# Patient Record
Sex: Male | Born: 1960 | Race: White | Hispanic: No | Marital: Single | Smoking: Current every day smoker
Health system: Southern US, Community
[De-identification: ages and names within clinical notes are randomized; demographics above are authoritative.]

## PROBLEM LIST (undated history)

## (undated) ENCOUNTER — Emergency Department (HOSPITAL_COMMUNITY): Payer: Medicaid Other

## (undated) DIAGNOSIS — Z5900 Homelessness unspecified: Secondary | ICD-10-CM

## (undated) DIAGNOSIS — F1010 Alcohol abuse, uncomplicated: Secondary | ICD-10-CM

## (undated) DIAGNOSIS — D539 Nutritional anemia, unspecified: Secondary | ICD-10-CM

## (undated) DIAGNOSIS — R7989 Other specified abnormal findings of blood chemistry: Secondary | ICD-10-CM

## (undated) DIAGNOSIS — I428 Other cardiomyopathies: Secondary | ICD-10-CM

## (undated) DIAGNOSIS — R945 Abnormal results of liver function studies: Secondary | ICD-10-CM

## (undated) DIAGNOSIS — I2510 Atherosclerotic heart disease of native coronary artery without angina pectoris: Secondary | ICD-10-CM

## (undated) DIAGNOSIS — I071 Rheumatic tricuspid insufficiency: Secondary | ICD-10-CM

## (undated) DIAGNOSIS — I340 Nonrheumatic mitral (valve) insufficiency: Secondary | ICD-10-CM

## (undated) DIAGNOSIS — J439 Emphysema, unspecified: Secondary | ICD-10-CM

## (undated) DIAGNOSIS — J9600 Acute respiratory failure, unspecified whether with hypoxia or hypercapnia: Secondary | ICD-10-CM

## (undated) DIAGNOSIS — C801 Malignant (primary) neoplasm, unspecified: Secondary | ICD-10-CM

## (undated) DIAGNOSIS — E46 Unspecified protein-calorie malnutrition: Secondary | ICD-10-CM

## (undated) DIAGNOSIS — R410 Disorientation, unspecified: Secondary | ICD-10-CM

## (undated) DIAGNOSIS — I5082 Biventricular heart failure: Secondary | ICD-10-CM

## (undated) HISTORY — DX: Atherosclerotic heart disease of native coronary artery without angina pectoris: I25.10

## (undated) HISTORY — DX: Acute respiratory failure, unspecified whether with hypoxia or hypercapnia: J96.00

## (undated) HISTORY — DX: Other specified abnormal findings of blood chemistry: R79.89

## (undated) HISTORY — DX: Abnormal results of liver function studies: R94.5

## (undated) HISTORY — DX: Biventricular heart failure: I50.82

## (undated) HISTORY — DX: Disorientation, unspecified: R41.0

## (undated) HISTORY — DX: Other cardiomyopathies: I42.8

## (undated) HISTORY — DX: Homelessness unspecified: Z59.00

## (undated) HISTORY — DX: Alcohol abuse, uncomplicated: F10.10

## (undated) HISTORY — DX: Nonrheumatic mitral (valve) insufficiency: I34.0

## (undated) HISTORY — DX: Nutritional anemia, unspecified: D53.9

## (undated) HISTORY — DX: Rheumatic tricuspid insufficiency: I07.1

## (undated) HISTORY — DX: Unspecified protein-calorie malnutrition: E46

---
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
## 2011-09-11 ENCOUNTER — Ambulatory Visit: Payer: Self-pay

## 2014-12-25 DIAGNOSIS — M5440 Lumbago with sciatica, unspecified side: Secondary | ICD-10-CM | POA: Insufficient documentation

## 2015-01-25 DIAGNOSIS — J410 Simple chronic bronchitis: Secondary | ICD-10-CM | POA: Insufficient documentation

## 2015-01-25 DIAGNOSIS — F17200 Nicotine dependence, unspecified, uncomplicated: Secondary | ICD-10-CM | POA: Insufficient documentation

## 2016-09-04 DIAGNOSIS — R531 Weakness: Secondary | ICD-10-CM | POA: Diagnosis not present

## 2016-09-07 DIAGNOSIS — W19XXXA Unspecified fall, initial encounter: Secondary | ICD-10-CM | POA: Diagnosis not present

## 2016-09-07 DIAGNOSIS — S2232XA Fracture of one rib, left side, initial encounter for closed fracture: Secondary | ICD-10-CM | POA: Diagnosis not present

## 2016-09-07 DIAGNOSIS — Z01810 Encounter for preprocedural cardiovascular examination: Secondary | ICD-10-CM | POA: Diagnosis not present

## 2016-12-29 ENCOUNTER — Encounter: Payer: Self-pay | Admitting: Pediatric Intensive Care

## 2017-01-01 ENCOUNTER — Emergency Department (HOSPITAL_COMMUNITY): Payer: Medicaid Other

## 2017-01-01 ENCOUNTER — Encounter (HOSPITAL_COMMUNITY): Payer: Self-pay | Admitting: Emergency Medicine

## 2017-01-01 ENCOUNTER — Emergency Department (HOSPITAL_COMMUNITY)
Admission: EM | Admit: 2017-01-01 | Discharge: 2017-01-01 | Disposition: A | Discharge status: Home / Self Care | Payer: Medicaid Other | Attending: Physician Assistant | Admitting: Physician Assistant

## 2017-01-01 ENCOUNTER — Encounter: Payer: Self-pay | Admitting: Pediatric Intensive Care

## 2017-01-01 DIAGNOSIS — M25561 Pain in right knee: Secondary | ICD-10-CM | POA: Diagnosis present

## 2017-01-01 DIAGNOSIS — F17210 Nicotine dependence, cigarettes, uncomplicated: Secondary | ICD-10-CM | POA: Diagnosis not present

## 2017-01-01 DIAGNOSIS — G8929 Other chronic pain: Secondary | ICD-10-CM | POA: Diagnosis not present
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
## 2017-01-01 NOTE — Discharge Instructions (Signed)

## 2017-01-01 NOTE — ED Triage Notes (Signed)
Pt complaint of worsening right knee pain for months; denies injury.

## 2017-01-01 NOTE — ED Notes (Signed)
Bed: WTR6 Expected date:  Expected time:  Means of arrival:  Comments: 

## 2017-01-01 NOTE — ED Notes (Signed)
Patient is alert and oriented x3.  He was given DC instructions and follow up visit instructions.  Patient gave verbal understanding.  He was DC ambulatory under his own power to home.  V/S stable.  He was not showing any signs of distress on DC 

## 2017-01-02 ENCOUNTER — Encounter: Payer: Self-pay | Admitting: Pediatric Intensive Care

## 2017-01-24 NOTE — Congregational Nurse Program (Signed)
Congregational Nurse Program Note  Date of Encounter: 01/01/2017  Past Medical History: No past medical history on file.  Encounter Details: CNP Questionnaire - 01/01/17 0915      Questionnaire   Patient Status  Not Applicable    Race  White or Caucasian    Location Patient Served At  [facility]  Medicaid    Uninsured  Not Applicable    Food  No food insecurities    Housing/Utilities  No permanent housing    Transportation  Yes, need transportation assistance;Provided transportation assistance (bus pass, taxi voucher, etc.);Within past 12 months, lack of transportation negatively impacted life    Interpersonal Safety  Yes, feel physically and emotionally safe where you currently live    Medication  No medication insecurities    Medical Provider  Yes    Referrals  Emergency Department    ED Visit Averted  Not Applicable    Life-Saving Intervention Made  Not Applicable      Client did not go to ED on Friday but feels like his knee pain is increased. He states he cannot ambulate to bus stop. Taxi vouchers to [employer] given.

## 2017-01-24 NOTE — Congregational Nurse Program (Signed)
Congregational Nurse Program Note  Date of Encounter: 12/29/2016  Past Medical History: No past medical history on file.  Encounter Details: CNP Questionnaire - 12/29/16 0915      Questionnaire   Patient Status  Not Applicable    Race  White or Caucasian    Location Patient Served At  [facility]  Medicaid    Uninsured  Not Applicable    Food  No food insecurities    Housing/Utilities  No permanent housing    Transportation  Provided transportation assistance (bus pass, taxi voucher, etc.)    Interpersonal Safety  Yes, feel physically and emotionally safe where you currently live    Medication  No medication insecurities    Medical Provider  Yes    Referrals  Emergency Department    ED Visit Averted  Not Applicable    Life-Saving Intervention Made  Not Applicable      New client. States he has had chronic right knee pain with cortisone injections in past. States that his knee is very painful today. He has been seen at [facility] past [facility]. CN recommended ED visit if client felt that he had some acute issue today or that his knee was too painful to ambulate. CN will assist with ortho follow up.

## 2017-01-25 NOTE — Congregational Nurse Program (Signed)
Congregational Nurse Program Note  Date of Encounter: 01/02/2017  Past Medical History: No past medical history on file.  Encounter Details: CNP Questionnaire - 01/02/17 0830      Questionnaire   Patient Status  Not Applicable    Race  White or Caucasian    Location Patient Served At  [employer]    Uninsured  Not Applicable    Food  No food insecurities    Housing/Utilities  No permanent housing    Transportation  Yes, need transportation assistance;Provided transportation assistance (bus pass, taxi voucher, etc.)    Interpersonal Safety  Yes, feel physically and emotionally safe where you currently live    Medication  No medication insecurities    Medical Provider  Yes    Referrals  Area Agency    ED Visit Averted  Not Applicable    Life-Saving Intervention Made  Not Applicable      Was seen in ED for knee pain. Is wearing a brace now. Needs bus passes for DSS.

## 2017-02-23 ENCOUNTER — Encounter: Payer: Self-pay | Admitting: Pediatric Intensive Care

## 2017-03-02 ENCOUNTER — Encounter: Payer: Self-pay | Admitting: Pediatric Intensive Care

## 2017-03-12 ENCOUNTER — Emergency Department (HOSPITAL_COMMUNITY): Payer: Self-pay

## 2017-03-12 ENCOUNTER — Emergency Department (HOSPITAL_COMMUNITY)
Admission: EM | Admit: 2017-03-12 | Discharge: 2017-03-13 | Disposition: A | Discharge status: Home / Self Care | Payer: Self-pay | Attending: Emergency Medicine | Admitting: Emergency Medicine

## 2017-03-12 DIAGNOSIS — R51 Headache: Secondary | ICD-10-CM | POA: Insufficient documentation

## 2017-03-12 DIAGNOSIS — Y999 Unspecified external cause status: Secondary | ICD-10-CM | POA: Insufficient documentation

## 2017-03-12 DIAGNOSIS — Y9229 Other specified public building as the place of occurrence of the external cause: Secondary | ICD-10-CM | POA: Insufficient documentation

## 2017-03-12 DIAGNOSIS — F17210 Nicotine dependence, cigarettes, uncomplicated: Secondary | ICD-10-CM | POA: Insufficient documentation

## 2017-03-12 DIAGNOSIS — Y939 Activity, unspecified: Secondary | ICD-10-CM | POA: Insufficient documentation

## 2017-03-12 DIAGNOSIS — M25561 Pain in right knee: Secondary | ICD-10-CM | POA: Insufficient documentation

## 2017-03-12 DIAGNOSIS — S62337A Displaced fracture of neck of fifth metacarpal bone, left hand, initial encounter for closed fracture: Secondary | ICD-10-CM | POA: Insufficient documentation

## 2017-03-12 DIAGNOSIS — S0081XA Abrasion of other part of head, initial encounter: Secondary | ICD-10-CM | POA: Insufficient documentation

## 2017-03-12 DIAGNOSIS — Z23 Encounter for immunization: Secondary | ICD-10-CM | POA: Insufficient documentation

## 2017-03-12 MED ORDER — OXYCODONE HCL 5 MG PO TABS
5.0000 mg | ORAL_TABLET | Freq: Once | ORAL | Status: AC
  Administered 2017-03-12: 5 mg via ORAL
  Filled 2017-03-12: qty 1

## 2017-03-12 MED ORDER — BUPIVACAINE HCL (PF) 0.5 % IJ SOLN
10.0000 mL | Freq: Once | INTRAMUSCULAR | Status: AC
  Administered 2017-03-12: 10 mL
  Filled 2017-03-12: qty 30

## 2017-03-12 MED ORDER — TETANUS-DIPHTH-ACELL PERTUSSIS 5-2.5-18.5 LF-MCG/0.5 IM SUSP
0.5000 mL | Freq: Once | INTRAMUSCULAR | Status: AC
  Administered 2017-03-13: 0.5 mL via INTRAMUSCULAR
  Filled 2017-03-12: qty 0.5

## 2017-03-12 MED ORDER — LIDOCAINE HCL (PF) 2 % IJ SOLN
10.0000 mL | Freq: Once | INTRAMUSCULAR | Status: AC
  Administered 2017-03-12: 10 mL via INTRADERMAL
  Filled 2017-03-12: qty 10
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
## 2017-03-12 NOTE — Discharge Instructions (Signed)
It is very important for you to follow-up with your doctor and a hand surgeon in the next week.  I suspect that your finger will potentially need surgery.  It is angulated on x-ray and is unstable on my exam.  Failure to follow-up could result in severe limitation of your hand range of motion and severe pain and disability.  Leave your splint on at all times. Do not lift ANYTHING with the left hand.
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
## 2017-03-12 NOTE — ED Notes (Signed)
Pt stated that he hurts in multiple locations. He is "sore everywhere"

## 2017-03-12 NOTE — ED Notes (Signed)
Bed: WH67 Expected date:  Expected time:  Means of arrival:  Comments: Hallway pt

## 2017-03-27 ENCOUNTER — Encounter: Payer: Self-pay | Admitting: Pediatric Intensive Care

## 2017-03-27 NOTE — Congregational Nurse Program (Signed)
Congregational Nurse Program Note  Date of Encounter: 02/23/2017  Past Medical History: No past medical history on file.  Encounter Details:  Client states that his right knee pain has increased as well as pain from sciatica. CN will assist with ortho referral.

## 2017-03-28 NOTE — Congregational Nurse Program (Signed)
Congregational Nurse Program Note  Date of Encounter: 03/02/2017  Past Medical History: No past medical history on file.  Encounter Details: CNP Questionnaire - 03/02/17 0930      Questionnaire   Patient Status  Not Applicable    Race  White or Caucasian    Location Patient Served At  [employer]    Uninsured  Not Applicable    Food  No food insecurities    Housing/Utilities  No permanent housing    Transportation  Yes, need transportation assistance;Provided transportation assistance (bus pass, taxi voucher, etc.)    Interpersonal Safety  Yes, feel physically and emotionally safe where you currently live    Medication  No medication insecurities    Medical Provider  Yes    Referrals  Area Agency    ED Visit Averted  Not Applicable    Life-Saving Intervention Made  Not Applicable      Needs bus passes for medical appointment. [place] route information given.

## 2017-04-03 ENCOUNTER — Ambulatory Visit (INDEPENDENT_AMBULATORY_CARE_PROVIDER_SITE_OTHER): Payer: Medicaid Other | Admitting: Orthopaedic Surgery

## 2017-04-03 ENCOUNTER — Encounter (INDEPENDENT_AMBULATORY_CARE_PROVIDER_SITE_OTHER): Payer: Self-pay | Admitting: Orthopaedic Surgery

## 2017-04-03 ENCOUNTER — Ambulatory Visit (INDEPENDENT_AMBULATORY_CARE_PROVIDER_SITE_OTHER): Payer: Self-pay

## 2017-04-03 DIAGNOSIS — M1711 Unilateral primary osteoarthritis, right knee: Secondary | ICD-10-CM

## 2017-04-03 NOTE — Progress Notes (Signed)
   Office Visit Note   Patient: Trevor Williams           Date of Birth: 07-02-1960           MRN: [id number] Visit Date: 04/03/2017              Requested by: Marliss Coots, NP [place], [place] [id number] PCP: Marliss Coots, NP   Assessment & Plan: Visit Diagnoses:  1. Unilateral primary osteoarthritis, right knee     Plan: Impression is right knee pain with failure of conservative treatment.  Recommend MRI to rule out structural abnormalities.  Follow-up afterwards. Total face to face encounter time was greater than 45 minutes and over half of this time was spent in counseling and/or coordination of care.  Follow-Up Instructions: Return in about 10 days (around 04/13/2017).   Orders:  Orders Placed This Encounter  Procedures  . XR Knee Complete 4 Views Right   No orders of the defined types were placed in this encounter.     Procedures: No procedures performed   Clinical Data: No additional findings.   Subjective: Chief Complaint  Patient presents with  . Right Knee - Pain    Patient is a 57 year old gentleman comes in with right knee pain that is progressively getting worse over the last couple months.  He denies any injuries.  He endorses significant locking popping weakness and giving way.  He has had a previous cortisone injection with 2 weeks of relief.  Denies any numbness and tingling.    Review of Systems  Constitutional: Negative.   All other systems reviewed and are negative.    Objective: Vital Signs: There were no vitals taken for this visit.  Physical Exam  Constitutional: He is oriented to person, place, and time. He appears well-developed and well-nourished.  HENT:  Head: Normocephalic and atraumatic.  Eyes: Pupils are equal, round, and reactive to light.  Neck: Neck supple.  Pulmonary/Chest: Effort normal.  Abdominal: Soft.  Musculoskeletal: Normal range of motion.  Neurological: He is alert and oriented to person,  place, and time.  Skin: Skin is warm.  Psychiatric: He has a normal mood and affect. His behavior is normal. Judgment and thought content normal.  Nursing note and vitals reviewed.   Ortho Exam Right knee exam shows no joint effusion.  Collaterals and cruciates are grossly intact.  Normal range of motion. Specialty Comments:  No specialty comments available.  Imaging: Xr Knee Complete 4 Views Right  Result Date: 04/03/2017 No significant joint space narrowing.  No structural abnormalities.    PMFS History: There are no active problems to display for this patient.  History reviewed. No pertinent past medical history.  History reviewed. No pertinent family history.  History reviewed. No pertinent surgical history. Social History   Occupational History  . Not on file  Tobacco Use  . Smoking status: Current Every Day Smoker    Packs/day: 1.00    Types: Cigarettes  Substance and Sexual Activity  . Alcohol use: Yes  . Drug use: No  . Sexual activity: Not on file

## 2017-04-13 ENCOUNTER — Encounter (INDEPENDENT_AMBULATORY_CARE_PROVIDER_SITE_OTHER): Payer: Self-pay

## 2017-04-13 ENCOUNTER — Ambulatory Visit (INDEPENDENT_AMBULATORY_CARE_PROVIDER_SITE_OTHER): Payer: Medicaid Other | Admitting: Orthopaedic Surgery

## 2017-04-13 ENCOUNTER — Encounter: Payer: Self-pay | Admitting: Pediatric Intensive Care

## 2017-04-21 ENCOUNTER — Inpatient Hospital Stay: Admission: RE | Admit: 2017-04-21 | Payer: Medicaid Other | Source: Ambulatory Visit

## 2017-04-24 NOTE — Congregational Nurse Program (Signed)
Congregational Nurse Program Note  Date of Encounter: 03/27/2017  Past Medical History: No past medical history on file.  Encounter Details: CNP Questionnaire - 03/27/17 1100      Questionnaire   Patient Status  Not Applicable    Location Patient Served At  [employer]  Medicaid    Uninsured  Not Applicable    Food  No food insecurities    Housing/Utilities  No permanent housing    Transportation  Yes, need transportation assistance;Provided transportation assistance (bus pass, taxi voucher, etc.)    Interpersonal Safety  Yes, feel physically and emotionally safe where you currently live    Medication  No medication insecurities    Medical Provider  Yes    Referrals  Not Applicable    ED Visit Averted  Not Applicable    Life-Saving Intervention Made  Not Applicable      Bus passes provided for ortho follow up appointment.

## 2017-05-05 NOTE — Congregational Nurse Program (Signed)
Congregational Nurse Program Note  Date of Encounter: 04/13/2017  Past Medical History: No past medical history on file.  Encounter Details: CNP Questionnaire - 04/13/17 0900      Questionnaire   Patient Status  Not Applicable    Race  White or Caucasian    Location Patient Served At  [employer]    Uninsured  Not Applicable    Food  No food insecurities    Housing/Utilities  No permanent housing    Transportation  Yes, need transportation assistance;Provided transportation assistance (bus pass, taxi voucher, etc.)    Interpersonal Safety  Yes, feel physically and emotionally safe where you currently live    Medication  No medication insecurities    Medical Provider  Yes    Referrals  Other    ED Visit Averted  Not Applicable    Life-Saving Intervention Made  Not Applicable      Provided bus passes for orthpedics appointment.

## 2017-06-19 ENCOUNTER — Encounter: Payer: Self-pay | Admitting: Pediatric Intensive Care

## 2017-06-19 ENCOUNTER — Telehealth: Payer: Self-pay | Admitting: Pediatric Intensive Care

## 2017-06-19 NOTE — Telephone Encounter (Signed)
Left message to reschedule appointment for client with Dr Erlinda Hong.

## 2017-06-19 NOTE — Congregational Nurse Program (Signed)
Congregational Nurse Program Note  Date of Encounter: 06/19/2017  Past Medical History: No past medical history on file.  Encounter Details: CNP Questionnaire - 06/19/17 0945      Questionnaire   Patient Status  Not Applicable    Race  White or Caucasian    Location Patient Served At  [employer]    Uninsured  Not Applicable    Food  No food insecurities    Housing/Utilities  No permanent housing    Transportation  Yes, need transportation assistance;Within past 12 months, lack of transportation negatively impacted life    Interpersonal Safety  Yes, feel physically and emotionally safe where you currently live    Medication  No medication insecurities    Medical Provider  Yes    Referrals  Other    ED Visit Averted  Not Applicable    Life-Saving Intervention Made  Not Applicable     Client requests CN [facility] CN spoke with practice and the client needs to complete the MRI prior to next appointment. Cn will notify client.

## 2017-07-03 ENCOUNTER — Telehealth: Payer: Self-pay | Admitting: Pediatric Intensive Care

## 2017-07-03 NOTE — Telephone Encounter (Signed)
CN contacted [place] on behalf of client. MRI scheduled for 4/22 at 7:30.

## 2017-07-04 ENCOUNTER — Telehealth: Payer: Self-pay | Admitting: Pediatric Intensive Care

## 2017-07-04 NOTE — Telephone Encounter (Signed)
Cn spoke with client regarding appointment for MRI. He stated that his Medicaid is not current. CN explained that he needed to have MRI before he could have further evaluation at [facility]. CN advised client to tell her when his insurance status changes.

## 2017-07-09 ENCOUNTER — Other Ambulatory Visit: Payer: Medicaid Other

## 2018-03-04 ENCOUNTER — Emergency Department (HOSPITAL_COMMUNITY): Payer: Medicaid Other

## 2018-03-04 ENCOUNTER — Emergency Department (HOSPITAL_COMMUNITY)
Admission: EM | Admit: 2018-03-04 | Discharge: 2018-03-04 | Disposition: A | Discharge status: Home / Self Care | Payer: Medicaid Other | Attending: Emergency Medicine | Admitting: Emergency Medicine

## 2018-03-04 ENCOUNTER — Encounter (HOSPITAL_COMMUNITY): Payer: Self-pay

## 2018-03-04 DIAGNOSIS — J441 Chronic obstructive pulmonary disease with (acute) exacerbation: Secondary | ICD-10-CM | POA: Insufficient documentation

## 2018-03-04 DIAGNOSIS — F17210 Nicotine dependence, cigarettes, uncomplicated: Secondary | ICD-10-CM | POA: Insufficient documentation

## 2018-03-04 DIAGNOSIS — R0602 Shortness of breath: Secondary | ICD-10-CM

## 2018-03-04 DIAGNOSIS — Z7982 Long term (current) use of aspirin: Secondary | ICD-10-CM | POA: Insufficient documentation

## 2018-03-04 HISTORY — DX: Malignant (primary) neoplasm, unspecified: C80.1

## 2018-03-04 HISTORY — DX: Emphysema, unspecified: J43.9

## 2018-03-04 LAB — CBC
HEMATOCRIT: 32.9 % — AB (ref 39.0–52.0)
HEMOGLOBIN: 11.4 g/dL — AB (ref 13.0–17.0)
MCH: 35.8 pg — AB (ref 26.0–34.0)
MCHC: 34.7 g/dL (ref 30.0–36.0)
MCV: 103.5 fL — AB (ref 80.0–100.0)
Platelets: 46 10*3/uL — ABNORMAL LOW (ref 150–400)
RBC: 3.18 MIL/uL — AB (ref 4.22–5.81)
RDW: 13.1 % (ref 11.5–15.5)
WBC: 6.6 10*3/uL (ref 4.0–10.5)
nRBC: 0 % (ref 0.0–0.2)

## 2018-03-04 LAB — BASIC METABOLIC PANEL
ANION GAP: 15 (ref 5–15)
BUN: 6 mg/dL (ref 6–20)
CHLORIDE: 100 mmol/L (ref 98–111)
CO2: 23 mmol/L (ref 22–32)
Calcium: 8.4 mg/dL — ABNORMAL LOW (ref 8.9–10.3)
Creatinine, Ser: 0.76 mg/dL (ref 0.61–1.24)
GFR calc Af Amer: >60 mL/min (ref >60)
GFR calc non Af Amer: >60 mL/min (ref >60)
Glucose, Bld: 82 mg/dL (ref 70–99)
POTASSIUM: 3 mmol/L — AB (ref 3.5–5.1)
Sodium: 138 mmol/L (ref 135–145)

## 2018-03-04 LAB — I-STAT TROPONIN, ED
TROPONIN I, POC: 0 ng/mL (ref 0.00–0.08)
Troponin i, poc: 0.01 ng/mL (ref 0.00–0.08)

## 2018-03-04 MED ORDER — DOXYCYCLINE HYCLATE 100 MG PO CAPS: 100.0000 mg | ORAL_CAPSULE | Freq: Two times a day (BID) | ORAL | 0 refills | Status: AC

## 2018-03-04 MED ORDER — POTASSIUM CHLORIDE CRYS ER 20 MEQ PO TBCR
40.0000 meq | EXTENDED_RELEASE_TABLET | Freq: Once | ORAL | Status: AC
  Administered 2018-03-04: 40 meq via ORAL
  Filled 2018-03-04: qty 2

## 2018-03-04 MED ORDER — PREDNISONE 10 MG PO TABS: 40.0000 mg | ORAL_TABLET | Freq: Every day | ORAL | 0 refills | Status: AC

## 2018-03-04 MED ORDER — IPRATROPIUM-ALBUTEROL 0.5-2.5 (3) MG/3ML IN SOLN
3.0000 mL | Freq: Once | RESPIRATORY_TRACT | Status: AC
  Administered 2018-03-04: 3 mL via RESPIRATORY_TRACT
  Filled 2018-03-04: qty 3

## 2018-03-04 MED ORDER — POTASSIUM CHLORIDE ER 10 MEQ PO TBCR: 20.0000 meq | EXTENDED_RELEASE_TABLET | Freq: Every day | ORAL | 0 refills | Status: DC

## 2018-03-04 MED ORDER — PREDNISONE 20 MG PO TABS
60.0000 mg | ORAL_TABLET | Freq: Once | ORAL | Status: AC
  Administered 2018-03-04: 60 mg via ORAL
  Filled 2018-03-04: qty 3

## 2018-03-04 MED ORDER — DOXYCYCLINE HYCLATE 100 MG PO TABS
100.0000 mg | ORAL_TABLET | Freq: Once | ORAL | Status: AC
  Administered 2018-03-04: 100 mg via ORAL
  Filled 2018-03-04: qty 1

## 2018-03-04 MED ORDER — ALBUTEROL SULFATE HFA 108 (90 BASE) MCG/ACT IN AERS
2.0000 | INHALATION_SPRAY | Freq: Once | RESPIRATORY_TRACT | Status: AC
  Administered 2018-03-04: 2 via RESPIRATORY_TRACT
  Filled 2018-03-04: qty 6.7

## 2018-03-04 NOTE — ED Notes (Signed)
Patient verbalizes understanding of discharge instructions. Opportunity for questioning and answers were provided. Armband removed by staff, pt discharged from ED home.

## 2018-03-04 NOTE — ED Notes (Signed)
Pt reports a cough for a month that is productive with white phlem. Pt reports he is homeless and is not currently on any medication.

## 2018-03-04 NOTE — ED Provider Notes (Signed)
[facility] EMERGENCY DEPARTMENT Provider Note   CSN: [id number] Arrival date & time: 03/04/18  1627   History   Chief Complaint Chief Complaint  Patient presents with  . Chest Pain  . Shortness of Breath    HPI Trevor Williams is a 57 y.o. male.  HPI Patient is a 57 year old male with history of COPD who presents to the emergency department for evaluation of cough and congestion.  Patient reports that he has had productive cough with white phlegm over the past 3-4 days.  Has had chest pain associated with this that is with coughing only.  Has had no fevers.  States he does not have any home medications including home inhalers due to being homeless.  Saturations of 93% per EMS.  Received 324 mg of aspirin by EMS prior to ED arrival.  In the ED patient has no chest pain.  States symptoms been present x1 month and worse over the past 3-4 days.  No recent antibiotics.  No leg swelling.  No history of DVT or PE.  No history of CAD.  Past Medical History:  Diagnosis Date  . Cancer ([place])    skin (left hand)  . Emphysema of lung ([place])     There are no active problems to display for this patient.   No past surgical history on file.      Home Medications    Prior to Admission medications   Medication Sig Start Date End Date Taking? Authorizing Provider  Aspirin-Acetaminophen-Caffeine (GOODY HEADACHE PO) Take 2 packets by mouth daily as needed (pain/headache).   Yes [provider]  doxycycline (VIBRAMYCIN) 100 MG capsule Take 1 capsule (100 mg total) by mouth 2 (two) times daily for 5 days. 03/04/18 03/09/18  Corrie Dandy, MD  potassium chloride (K-DUR) 10 MEQ tablet Take 2 tablets (20 mEq total) by mouth daily for 3 days. 03/04/18 03/07/18  Corrie Dandy, MD  predniSONE (DELTASONE) 10 MG tablet Take 4 tablets (40 mg total) by mouth daily for 3 days. 03/04/18 03/07/18  Corrie Dandy, MD    Family History No family history on file.  Social  History Social History   Tobacco Use  . Smoking status: Current Every Day Smoker    Packs/day: 1.00    Types: Cigarettes  . Smokeless tobacco: Current User  Substance Use Topics  . Alcohol use: Yes  . Drug use: No     Allergies   Ibuprofen and Tylenol [acetaminophen]   Review of Systems Review of Systems  Constitutional: Negative for chills and fever.  HENT: Positive for congestion.   Respiratory: Positive for cough and shortness of breath.   Cardiovascular: Positive for chest pain. Negative for leg swelling.  Gastrointestinal: Negative for abdominal pain, diarrhea, nausea and vomiting.  Genitourinary: Negative for dysuria and hematuria.  Musculoskeletal: Negative for back pain and neck pain.  Skin: Negative for rash.  Neurological: Negative for weakness, numbness and headaches.  All other systems reviewed and are negative.    Physical Exam Updated Vital Signs BP 124/84   Pulse 88   Temp 98.9 F (37.2 C) (Oral)   Resp 16   Ht 5\' 6"  (1.676 m)   Wt 61.2 kg   SpO2 100%   BMI 21.79 kg/m   Physical Exam Vitals signs and nursing note reviewed.  Constitutional:      General: He is not in acute distress.    Appearance: He is not ill-appearing, toxic-appearing or diaphoretic.  HENT:     Head: Normocephalic  and atraumatic.     Nose: No congestion.  Eyes:     General:        Right eye: No discharge.        Left eye: No discharge.     Conjunctiva/sclera: Conjunctivae normal.  Neck:     Musculoskeletal: Normal range of motion.     Trachea: No tracheal deviation.  Cardiovascular:     Rate and Rhythm: Regular rhythm.     Heart sounds: No murmur.  Pulmonary:     Effort: Pulmonary effort is normal. No respiratory distress.     Breath sounds: Wheezing (scattered bilaterally) present. No rales.  Chest:     Chest wall: Tenderness (reproducible) present.  Abdominal:     General: Bowel sounds are normal. There is no distension.     Palpations: Abdomen is soft.      Tenderness: There is no abdominal tenderness.  Musculoskeletal:        General: No deformity.     Right lower leg: No edema.     Left lower leg: No edema.  Skin:    Capillary Refill: Capillary refill takes less than 2 seconds.     Findings: No rash.  Neurological:     Mental Status: He is alert.     Motor: No abnormal muscle tone.      ED Treatments / Results  Labs (all labs ordered are listed, but only abnormal results are displayed) Labs Reviewed  BASIC METABOLIC PANEL - Abnormal; Notable for the following components:      Result Value   Potassium 3.0 (*)    Calcium 8.4 (*)    All other components within normal limits  CBC - Abnormal; Notable for the following components:   RBC 3.18 (*)    Hemoglobin 11.4 (*)    HCT 32.9 (*)    MCV 103.5 (*)    MCH 35.8 (*)    Platelets 46 (*)    All other components within normal limits  I-STAT TROPONIN, ED  I-STAT TROPONIN, ED    EKG EKG Interpretation  Date/Time:  Monday March 04 2018 16:37:38 EST Ventricular Rate:  86 PR Interval:  142 QRS Duration: 72 QT Interval:  414 QTC Calculation: 495 R Axis:   73 Text Interpretation:  Normal sinus rhythm Septal infarct , age undetermined ST & T wave abnormality, consider inferolateral ischemia Abnormal ECG No old tracing to compare Confirmed by Deno Etienne [phone]) on 03/04/2018 8:47:55 PM   Radiology Dg Chest 2 View  Result Date: 03/04/2018 CLINICAL DATA:  Productive cough for 1 month EXAM: CHEST - 2 VIEW COMPARISON:  03/12/2017 FINDINGS: Cardiac shadows within normal limits. The lungs are hyperaerated bilaterally. No focal infiltrate or sizable effusion is seen. Posterior left eighth rib fracture is noted with callus formation slightly more prominent than that seen on the prior exam. No acute bony abnormality is seen. IMPRESSION: COPD without acute abnormality. Old left rib fracture with callus formation. Electronically Signed   By: Inez Catalina M.D.   On: 03/04/2018 17:54     Procedures Procedures (including critical care time)  Medications Ordered in ED Medications  ipratropium-albuterol (DUONEB) 0.5-2.5 (3) MG/3ML nebulizer solution 3 mL (3 mLs Nebulization Given 03/04/18 2237)  predniSONE (DELTASONE) tablet 60 mg (60 mg Oral Given 03/04/18 2237)  albuterol (PROVENTIL HFA;VENTOLIN HFA) 108 (90 Base) MCG/ACT inhaler 2 puff (2 puffs Inhalation Given 03/04/18 2236)  doxycycline (VIBRA-TABS) tablet 100 mg (100 mg Oral Given 03/04/18 2237)  potassium chloride SA (K-DUR,KLOR-CON) CR tablet  40 mEq (40 mEq Oral Given 03/04/18 2253)     Initial Impression / Assessment and Plan / ED Course  I have reviewed the triage vital signs and the nursing notes.  Pertinent labs & imaging results that were available during my care of the patient were reviewed by me and considered in my medical decision making (see chart for details).    Patient is a 57 year old male with history of COPD who presents to the emergency department for evaluation of cough, congestion, CP, & SOA.   Patient afebrile and hemodynamically stable on room air at time of ED presentation.  Exam as above.  Nontoxic-appearing.  Speaking full sentences.  Ambulates at hallway without difficulty.  No hypoxia.  Patient has very atypical chest pain that is reproducible on exam and scattered wheezing bilaterally.  Most concerning for COPD exacerbation or pneumonia.  Will obtain chest x-ray and give duo nebs and steroids.  ECG obtained that does show T wave inversions in the inferior and lateral leads.  No prior ECG available for comparison.  Will obtain delta troponins. HEART pathway score 3.   CXR reviewed with no acute cardiac or pulmonary abnormality.  No focal consolidation to suggest pneumonia.  No effusions.  No widening of mediastinum.  No findings to suggest aortic dissection or aneurysm at this time.  Patient has no leg swelling.  No tachycardia.  No findings to concern for PE at this time.  Metabolic  panel remarkable for hypokalemia.  Repleted in the ED.  CBC with mild anemia with hemoglobin 11.4.  No leukocytosis. Plts 46, chronic thrombocytopenia per reviewe of OSH records.   Delta troponin remains unremarkable. Very atypical chest pain. Discussed admission for further workup with patient who prefers dc at this time and will f/u with PCP for these findings. Will dc with with prednisone, albuterol inhaler, and doxycycline for COPD exacerbation. Strict return precautions given. Stable at dc.   Case and plan of care discussed with Dr. Tyrone Nine.   Final Clinical Impressions(s) / ED Diagnoses   Final diagnoses:  COPD exacerbation ([place])  Shortness of breath    ED Discharge Orders         Ordered    doxycycline (VIBRAMYCIN) 100 MG capsule  2 times daily     03/04/18 2244    predniSONE (DELTASONE) 10 MG tablet  Daily     03/04/18 2244    potassium chloride (K-DUR) 10 MEQ tablet  Daily     03/04/18 2244           Corrie Dandy, MD 03/04/18 [place], [place], DO 03/04/18 2332

## 2018-03-04 NOTE — ED Triage Notes (Signed)
Per [facility], pt endorses propductive cough x1 month, left sided CP 6/10, and SOB x4 days. Pt has hx of emphysema. Pt received 324 ASA en route. Per EMS pt was 93% on RA, but 100% on [place]. PT 99% on RA after ambulating into triage room.

## 2018-06-01 ENCOUNTER — Other Ambulatory Visit: Payer: Self-pay

## 2018-06-01 ENCOUNTER — Emergency Department (HOSPITAL_COMMUNITY)
Admission: EM | Admit: 2018-06-01 | Discharge: 2018-06-01 | Disposition: A | Discharge status: Home / Self Care | Payer: Medicaid Other | Attending: Emergency Medicine | Admitting: Emergency Medicine

## 2018-06-01 ENCOUNTER — Encounter (HOSPITAL_COMMUNITY): Payer: Self-pay | Admitting: Emergency Medicine

## 2018-06-01 ENCOUNTER — Emergency Department (HOSPITAL_COMMUNITY): Payer: Medicaid Other

## 2018-06-01 DIAGNOSIS — Z79899 Other long term (current) drug therapy: Secondary | ICD-10-CM | POA: Insufficient documentation

## 2018-06-01 DIAGNOSIS — J441 Chronic obstructive pulmonary disease with (acute) exacerbation: Secondary | ICD-10-CM | POA: Diagnosis not present

## 2018-06-01 DIAGNOSIS — R0602 Shortness of breath: Secondary | ICD-10-CM | POA: Diagnosis not present

## 2018-06-01 DIAGNOSIS — F17210 Nicotine dependence, cigarettes, uncomplicated: Secondary | ICD-10-CM | POA: Diagnosis not present

## 2018-06-01 DIAGNOSIS — R0981 Nasal congestion: Secondary | ICD-10-CM | POA: Diagnosis present

## 2018-06-01 LAB — URINALYSIS, ROUTINE W REFLEX MICROSCOPIC
BACTERIA UA: NONE SEEN
BILIRUBIN URINE: NEGATIVE
Glucose, UA: NEGATIVE mg/dL
Hgb urine dipstick: NEGATIVE
Ketones, ur: NEGATIVE mg/dL
Leukocytes,Ua: NEGATIVE
Nitrite: NEGATIVE
Protein, ur: 30 mg/dL — AB
Specific Gravity, Urine: 1.014 (ref 1.005–1.030)
pH: 7 (ref 5.0–8.0)

## 2018-06-01 LAB — CBC WITH DIFFERENTIAL/PLATELET
Abs Immature Granulocytes: 0.01 10*3/uL (ref 0.00–0.07)
BASOS PCT: 1 %
Basophils Absolute: 0 10*3/uL (ref 0.0–0.1)
Eosinophils Absolute: 0.2 10*3/uL (ref 0.0–0.5)
Eosinophils Relative: 4 %
HCT: 35.3 % — ABNORMAL LOW (ref 39.0–52.0)
Hemoglobin: 11.5 g/dL — ABNORMAL LOW (ref 13.0–17.0)
Immature Granulocytes: 0 %
Lymphocytes Relative: 40 %
Lymphs Abs: 1.4 10*3/uL (ref 0.7–4.0)
MCH: 35.4 pg — AB (ref 26.0–34.0)
MCHC: 32.6 g/dL (ref 30.0–36.0)
MCV: 108.6 fL — AB (ref 80.0–100.0)
MONO ABS: 0.3 10*3/uL (ref 0.1–1.0)
MONOS PCT: 9 %
Neutro Abs: 1.6 10*3/uL — ABNORMAL LOW (ref 1.7–7.7)
Neutrophils Relative %: 46 %
Platelets: 40 10*3/uL — ABNORMAL LOW (ref 150–400)
RBC: 3.25 MIL/uL — AB (ref 4.22–5.81)
RDW: 12.4 % (ref 11.5–15.5)
WBC: 3.5 10*3/uL — ABNORMAL LOW (ref 4.0–10.5)
nRBC: 0 % (ref 0.0–0.2)

## 2018-06-01 LAB — COMPREHENSIVE METABOLIC PANEL
ALT: 76 U/L — ABNORMAL HIGH (ref 0–44)
AST: 193 U/L — ABNORMAL HIGH (ref 15–41)
Albumin: 3.6 g/dL (ref 3.5–5.0)
Alkaline Phosphatase: 97 U/L (ref 38–126)
Anion gap: 10 (ref 5–15)
BILIRUBIN TOTAL: 0.7 mg/dL (ref 0.3–1.2)
BUN: 5 mg/dL — ABNORMAL LOW (ref 6–20)
CO2: 26 mmol/L (ref 22–32)
Calcium: 8.3 mg/dL — ABNORMAL LOW (ref 8.9–10.3)
Chloride: 105 mmol/L (ref 98–111)
Creatinine, Ser: 0.67 mg/dL (ref 0.61–1.24)
GFR calc Af Amer: >60 mL/min (ref >60)
GFR calc non Af Amer: >60 mL/min (ref >60)
Glucose, Bld: 106 mg/dL — ABNORMAL HIGH (ref 70–99)
Potassium: 3.6 mmol/L (ref 3.5–5.1)
Sodium: 141 mmol/L (ref 135–145)
TOTAL PROTEIN: 7.1 g/dL (ref 6.5–8.1)

## 2018-06-01 LAB — RAPID URINE DRUG SCREEN, HOSP PERFORMED
Amphetamines: NOT DETECTED
BARBITURATES: NOT DETECTED
Benzodiazepines: NOT DETECTED
COCAINE: NOT DETECTED
Opiates: NOT DETECTED
Tetrahydrocannabinol: NOT DETECTED

## 2018-06-01 LAB — BRAIN NATRIURETIC PEPTIDE: B Natriuretic Peptide: 428.8 pg/mL — ABNORMAL HIGH (ref 0.0–100.0)

## 2018-06-01 LAB — ETHANOL: Alcohol, Ethyl (B): 434 mg/dL (ref <10)

## 2018-06-01 LAB — PROTIME-INR
INR: 1 (ref 0.8–1.2)
PROTHROMBIN TIME: 12.8 s (ref 11.4–15.2)

## 2018-06-01 MED ORDER — DOXYCYCLINE HYCLATE 100 MG PO TABS
100.0000 mg | ORAL_TABLET | Freq: Once | ORAL | Status: AC
  Administered 2018-06-01: 100 mg via ORAL
  Filled 2018-06-01: qty 1

## 2018-06-01 MED ORDER — IPRATROPIUM-ALBUTEROL 0.5-2.5 (3) MG/3ML IN SOLN
3.0000 mL | Freq: Once | RESPIRATORY_TRACT | Status: AC
  Administered 2018-06-01: 3 mL via RESPIRATORY_TRACT
  Filled 2018-06-01: qty 3

## 2018-06-01 MED ORDER — DOXYCYCLINE HYCLATE 100 MG PO CAPS: 100.0000 mg | ORAL_CAPSULE | Freq: Two times a day (BID) | ORAL | 0 refills | Status: DC

## 2018-06-01 MED ORDER — ALBUTEROL SULFATE HFA 108 (90 BASE) MCG/ACT IN AERS
2.0000 | INHALATION_SPRAY | RESPIRATORY_TRACT | Status: DC | PRN
  Administered 2018-06-01: 2 via RESPIRATORY_TRACT
  Filled 2018-06-01: qty 6.7

## 2018-06-01 MED ORDER — PREDNISONE 20 MG PO TABS
40.0000 mg | ORAL_TABLET | Freq: Once | ORAL | Status: AC
  Administered 2018-06-01: 40 mg via ORAL
  Filled 2018-06-01: qty 2

## 2018-06-01 MED ORDER — PREDNISONE 10 MG PO TABS: 40.0000 mg | ORAL_TABLET | Freq: Every day | ORAL | 0 refills | Status: DC

## 2018-06-01 NOTE — Discharge Instructions (Addendum)
Use your inhaler as needed and continue to take your home medications. Take the medications we give you as prescribed. Follow up with your health care provider. Return here as needed for worsening symptoms.

## 2018-06-01 NOTE — ED Notes (Signed)
Debroah Baller NP made aware pt is homeless and has no money to pay for prescriptions prescribed.  Case management not here.  NP put in case management consult to advise pt will return tomorrow to discuss needs with case management.  Bus ticket given to pt.

## 2018-06-01 NOTE — ED Provider Notes (Addendum)
  Physical Exam  BP 114/77 (BP Location: Right Arm)   Pulse 89   Temp 97.8 F (36.6 C) (Oral)   Resp 16   SpO2 94%  Care assumed from Dr. Johnney Killian @7 :00 pm. 59 y.o. male awaiting x-ray results.  Dg Chest 2 View  Result Date: 06/01/2018 CLINICAL DATA:  Nasal congestion for 2 weeks. History of emphysema. Cough. EXAM: CHEST - 2 VIEW COMPARISON:  03/04/2018 FINDINGS: Shallow inspiration with linear atelectasis or fibrosis in the lung bases. Heart size and pulmonary vascularity are normal. Emphysematous changes in the upper lungs. No blunting of costophrenic angles. No pneumothorax. Mediastinal contours appear intact. No focal consolidation. IMPRESSION: Emphysematous changes and fibrosis in the lungs. No evidence of active pulmonary disease. Electronically Signed   By: Lucienne Capers M.D.   On: 06/01/2018 19:09   Discussed with the patient COPD exacerbation and treatment plan. Rx doxycycline and short steroid burst. Return precautions discussed. Patient to f/u with his PCP. Patient agrees with plan.   ED Course/Procedures    Patient is homeless and does not have money for medication. Consult with case manager to help patient with medication. There is no care manager here tonight so order placed in d/c section.    Debroah Baller [place], NP 06/01/18 2038    Debroah Baller [place], NP 06/01/18 2052    Charlesetta Shanks, MD 06/02/18 1213

## 2018-06-01 NOTE — ED Triage Notes (Signed)
Pt BIB [facility], c/o nasal congestion x 2 weeks.

## 2018-06-01 NOTE — ED Provider Notes (Addendum)
[place] EMERGENCY DEPARTMENT Provider Note   CSN: [id number] Arrival date & time: 06/01/18  1704    History   Chief Complaint Chief Complaint  Patient presents with  . Nasal Congestion    HPI Trevor Williams is a 58 y.o. male.     HPI Patient reports he is felt very short of breath for 2 to 3 days.  He reports he feels like he has chest pain that is diffuse and tight in his chest.  That has been constant.  He reports he is coughing up some yellow mucus.  He reports history of COPD and chronic alcohol and tobacco use.  He estimates he drinks about a half of 1/5/day.  He reports he does have inhalers that he is supposed to use.  He has not had any fever or chills.  He reports he does have some lower extremity swelling bilaterally without calf pain or tenderness. Past Medical History:  Diagnosis Date  . Cancer ([place])    skin (left hand)  . Emphysema of lung ([place])     There are no active problems to display for this patient.   History reviewed. No pertinent surgical history.      Home Medications    Prior to Admission medications   Medication Sig Start Date End Date Taking? Authorizing Provider  Aspirin-Acetaminophen-Caffeine (GOODY HEADACHE PO) Take 2 packets by mouth daily as needed (pain/headache).    [provider]  potassium chloride (K-DUR) 10 MEQ tablet Take 2 tablets (20 mEq total) by mouth daily for 3 days. 03/04/18 03/07/18  Corrie Dandy, MD    Family History No family history on file.  Social History Social History   Tobacco Use  . Smoking status: Current Every Day Smoker    Packs/day: 1.00    Types: Cigarettes  . Smokeless tobacco: Current User  Substance Use Topics  . Alcohol use: Yes  . Drug use: No     Allergies   Ibuprofen and Tylenol [acetaminophen]   Review of Systems Review of Systems 10 Systems reviewed and are negative for acute change except as noted in the HPI.  Physical Exam Updated Vital Signs  BP 106/76 (BP Location: Left Arm)   Pulse 94   Temp (!) 97.4 F (36.3 C) (Oral)   Resp 18   SpO2 98%   Physical Exam Constitutional:      Comments: Patient is alert and appropriate.  He is thin and disheveled.  Mild increased work of breathing at rest.  HENT:     Head: Normocephalic and atraumatic.     Mouth/Throat:     Mouth: Mucous membranes are moist.  Eyes:     Extraocular Movements: Extraocular movements intact.  Cardiovascular:     Rate and Rhythm: Normal rate and regular rhythm.  Pulmonary:     Comments: Mild increased work of breathing.  Breath sounds are soft but grossly clear.  No crackles. Abdominal:     General: There is no distension.     Palpations: Abdomen is soft.     Tenderness: There is no abdominal tenderness. There is no guarding.  Musculoskeletal: Normal range of motion.     Comments: He is pitting edema.  Calves are soft and nontender.  Skin:    General: Skin is warm and dry.  Neurological:     General: No focal deficit present.     Mental Status: He is oriented to person, place, and time.     Coordination: Coordination normal.  Psychiatric:  Mood and Affect: Mood normal.      ED Treatments / Results  Labs (all labs ordered are listed, but only abnormal results are displayed) Labs Reviewed  COMPREHENSIVE METABOLIC PANEL - Abnormal; Notable for the following components:      Result Value   Glucose, Bld 106 (*)    BUN 5 (*)    Calcium 8.3 (*)    AST 193 (*)    ALT 76 (*)    All other components within normal limits  ETHANOL - Abnormal; Notable for the following components:   Alcohol, Ethyl (B) 434 (*)    All other components within normal limits  CBC WITH DIFFERENTIAL/PLATELET - Abnormal; Notable for the following components:   WBC 3.5 (*)    RBC 3.25 (*)    Hemoglobin 11.5 (*)    HCT 35.3 (*)    MCV 108.6 (*)    MCH 35.4 (*)    Platelets 40 (*)    Neutro Abs 1.6 (*)    All other components within normal limits  URINALYSIS,  ROUTINE W REFLEX MICROSCOPIC - Abnormal; Notable for the following components:   Protein, ur 30 (*)    All other components within normal limits  PROTIME-INR  BRAIN NATRIURETIC PEPTIDE  RAPID URINE DRUG SCREEN, HOSP PERFORMED    EKG EKG Interpretation  Date/Time:  Saturday June 01 2018 17:00:32 EDT Ventricular Rate:  92 PR Interval:  146 QRS Duration: 76 QT Interval:  406 QTC Calculation: 502 R Axis:   31 Text Interpretation:  Normal sinus rhythm Septal infarct , age undetermined Prolonged QT Abnormal ECG no sig change from previous Confirmed by Charlesetta Shanks [phone]) on 06/01/2018 7:03:37 PM   Radiology No results found.  Procedures Procedures (including critical care time)  Medications Ordered in ED Medications  ipratropium-albuterol (DUONEB) 0.5-2.5 (3) MG/3ML nebulizer solution 3 mL (3 mLs Nebulization Given 06/01/18 1821)     Initial Impression / Assessment and Plan / ED Course  I have reviewed the triage vital signs and the nursing notes.  Pertinent labs & imaging results that were available during my care of the patient were reviewed by me and considered in my medical decision making (see chart for details).       Patient is alert and nontoxic.  He reports he has had cough and chest congestion.  He is a heavy alcohol consumer.  Current alcohol level is greater than 400.  Patient however is alert and appropriate.  He is not showing any signs of somnolence or confusion.  He has been clinically evaluated to rule out CHF or respiratory infection.  His lung sounds are clear.  If chest x-ray is negative I have higher suspicion for possible COPD exacerbation.  Patient is a long-term smoker.  If no signs of congestive heart failure patient may be treated with course of doxycycline and inhalers with a 5-day prednisone course.   Final Clinical Impressions(s) / ED Diagnoses   Final diagnoses:  COPD exacerbation [facility])    ED Discharge Orders    None       Charlesetta Shanks, MD 06/01/18 Fritzi Mandes, MD 06/01/18 1904

## 2018-06-01 NOTE — ED Notes (Addendum)
Pt is aware of need for urine sample, urinal at bedside 

## 2018-07-28 ENCOUNTER — Other Ambulatory Visit: Payer: Self-pay

## 2018-07-28 ENCOUNTER — Emergency Department (HOSPITAL_COMMUNITY): Payer: Medicaid Other

## 2018-07-28 ENCOUNTER — Inpatient Hospital Stay (HOSPITAL_COMMUNITY)
Admission: EM | Admit: 2018-07-28 | Discharge: 2018-08-12 | DRG: 207 | Disposition: A | Discharge status: Home / Self Care | Payer: Medicaid Other | Attending: Internal Medicine | Admitting: Internal Medicine

## 2018-07-28 ENCOUNTER — Encounter (HOSPITAL_COMMUNITY): Payer: Self-pay | Admitting: Emergency Medicine

## 2018-07-28 DIAGNOSIS — Z681 Body mass index (BMI) 19 or less, adult: Secondary | ICD-10-CM

## 2018-07-28 DIAGNOSIS — R0602 Shortness of breath: Secondary | ICD-10-CM | POA: Diagnosis not present

## 2018-07-28 DIAGNOSIS — E872 Acidosis: Secondary | ICD-10-CM | POA: Diagnosis not present

## 2018-07-28 DIAGNOSIS — I110 Hypertensive heart disease with heart failure: Secondary | ICD-10-CM | POA: Diagnosis present

## 2018-07-28 DIAGNOSIS — Z886 Allergy status to analgesic agent status: Secondary | ICD-10-CM

## 2018-07-28 DIAGNOSIS — D509 Iron deficiency anemia, unspecified: Secondary | ICD-10-CM | POA: Diagnosis present

## 2018-07-28 DIAGNOSIS — R05 Cough: Secondary | ICD-10-CM | POA: Diagnosis not present

## 2018-07-28 DIAGNOSIS — Z8249 Family history of ischemic heart disease and other diseases of the circulatory system: Secondary | ICD-10-CM

## 2018-07-28 DIAGNOSIS — I081 Rheumatic disorders of both mitral and tricuspid valves: Secondary | ICD-10-CM | POA: Diagnosis present

## 2018-07-28 DIAGNOSIS — R21 Rash and other nonspecific skin eruption: Secondary | ICD-10-CM | POA: Diagnosis not present

## 2018-07-28 DIAGNOSIS — R0902 Hypoxemia: Secondary | ICD-10-CM | POA: Diagnosis not present

## 2018-07-28 DIAGNOSIS — R739 Hyperglycemia, unspecified: Secondary | ICD-10-CM | POA: Diagnosis not present

## 2018-07-28 DIAGNOSIS — J9601 Acute respiratory failure with hypoxia: Secondary | ICD-10-CM | POA: Diagnosis present

## 2018-07-28 DIAGNOSIS — J189 Pneumonia, unspecified organism: Secondary | ICD-10-CM | POA: Diagnosis not present

## 2018-07-28 DIAGNOSIS — J181 Lobar pneumonia, unspecified organism: Principal | ICD-10-CM | POA: Diagnosis present

## 2018-07-28 DIAGNOSIS — E876 Hypokalemia: Secondary | ICD-10-CM | POA: Diagnosis not present

## 2018-07-28 DIAGNOSIS — I2510 Atherosclerotic heart disease of native coronary artery without angina pectoris: Secondary | ICD-10-CM | POA: Diagnosis present

## 2018-07-28 DIAGNOSIS — Z881 Allergy status to other antibiotic agents status: Secondary | ICD-10-CM

## 2018-07-28 DIAGNOSIS — J441 Chronic obstructive pulmonary disease with (acute) exacerbation: Secondary | ICD-10-CM

## 2018-07-28 DIAGNOSIS — D638 Anemia in other chronic diseases classified elsewhere: Secondary | ICD-10-CM | POA: Diagnosis present

## 2018-07-28 DIAGNOSIS — Z4659 Encounter for fitting and adjustment of other gastrointestinal appliance and device: Secondary | ICD-10-CM

## 2018-07-28 DIAGNOSIS — Z85828 Personal history of other malignant neoplasm of skin: Secondary | ICD-10-CM

## 2018-07-28 DIAGNOSIS — E43 Unspecified severe protein-calorie malnutrition: Secondary | ICD-10-CM | POA: Diagnosis present

## 2018-07-28 DIAGNOSIS — Z01818 Encounter for other preprocedural examination: Secondary | ICD-10-CM

## 2018-07-28 DIAGNOSIS — Z20828 Contact with and (suspected) exposure to other viral communicable diseases: Secondary | ICD-10-CM | POA: Diagnosis present

## 2018-07-28 DIAGNOSIS — J432 Centrilobular emphysema: Secondary | ICD-10-CM | POA: Diagnosis present

## 2018-07-28 DIAGNOSIS — G9341 Metabolic encephalopathy: Secondary | ICD-10-CM | POA: Diagnosis not present

## 2018-07-28 DIAGNOSIS — D6489 Other specified anemias: Secondary | ICD-10-CM | POA: Diagnosis present

## 2018-07-28 DIAGNOSIS — Z590 Homelessness: Secondary | ICD-10-CM

## 2018-07-28 DIAGNOSIS — I959 Hypotension, unspecified: Secondary | ICD-10-CM | POA: Diagnosis not present

## 2018-07-28 DIAGNOSIS — Z978 Presence of other specified devices: Secondary | ICD-10-CM

## 2018-07-28 DIAGNOSIS — D539 Nutritional anemia, unspecified: Secondary | ICD-10-CM | POA: Diagnosis present

## 2018-07-28 DIAGNOSIS — F10239 Alcohol dependence with withdrawal, unspecified: Secondary | ICD-10-CM | POA: Diagnosis present

## 2018-07-28 DIAGNOSIS — F17210 Nicotine dependence, cigarettes, uncomplicated: Secondary | ICD-10-CM | POA: Diagnosis present

## 2018-07-28 DIAGNOSIS — D6959 Other secondary thrombocytopenia: Secondary | ICD-10-CM | POA: Diagnosis present

## 2018-07-28 DIAGNOSIS — J9690 Respiratory failure, unspecified, unspecified whether with hypoxia or hypercapnia: Secondary | ICD-10-CM

## 2018-07-28 DIAGNOSIS — I5023 Acute on chronic systolic (congestive) heart failure: Secondary | ICD-10-CM | POA: Diagnosis present

## 2018-07-28 LAB — CBC WITH DIFFERENTIAL/PLATELET
Abs Immature Granulocytes: 0.02 10*3/uL (ref 0.00–0.07)
Basophils Absolute: 0.1 10*3/uL (ref 0.0–0.1)
Basophils Relative: 1 %
Eosinophils Absolute: 0.2 10*3/uL (ref 0.0–0.5)
Eosinophils Relative: 3 %
HCT: 33.6 % — ABNORMAL LOW (ref 39.0–52.0)
Hemoglobin: 11.2 g/dL — ABNORMAL LOW (ref 13.0–17.0)
Immature Granulocytes: 0 %
Lymphocytes Relative: 54 %
Lymphs Abs: 2.4 10*3/uL (ref 0.7–4.0)
MCH: 36.8 pg — ABNORMAL HIGH (ref 26.0–34.0)
MCHC: 33.3 g/dL (ref 30.0–36.0)
MCV: 110.5 fL — ABNORMAL HIGH (ref 80.0–100.0)
Monocytes Absolute: 0.5 10*3/uL (ref 0.1–1.0)
Monocytes Relative: 10 %
Neutro Abs: 1.5 10*3/uL — ABNORMAL LOW (ref 1.7–7.7)
Neutrophils Relative %: 32 %
Platelets: 47 10*3/uL — ABNORMAL LOW (ref 150–400)
RBC: 3.04 MIL/uL — ABNORMAL LOW (ref 4.22–5.81)
RDW: 12.7 % (ref 11.5–15.5)
WBC: 4.5 10*3/uL (ref 4.0–10.5)
nRBC: 0 % (ref 0.0–0.2)

## 2018-07-28 LAB — COMPREHENSIVE METABOLIC PANEL
ALT: 74 U/L — ABNORMAL HIGH (ref 0–44)
AST: 197 U/L — ABNORMAL HIGH (ref 15–41)
Albumin: 3.6 g/dL (ref 3.5–5.0)
Alkaline Phosphatase: 107 U/L (ref 38–126)
Anion gap: 16 — ABNORMAL HIGH (ref 5–15)
BUN: 11 mg/dL (ref 6–20)
CO2: 20 mmol/L — ABNORMAL LOW (ref 22–32)
Calcium: 8.8 mg/dL — ABNORMAL LOW (ref 8.9–10.3)
Chloride: 108 mmol/L (ref 98–111)
Creatinine, Ser: 0.59 mg/dL — ABNORMAL LOW (ref 0.61–1.24)
GFR calc Af Amer: >60 mL/min (ref >60)
GFR calc non Af Amer: >60 mL/min (ref >60)
Glucose, Bld: 98 mg/dL (ref 70–99)
Potassium: 3.9 mmol/L (ref 3.5–5.1)
Sodium: 144 mmol/L (ref 135–145)
Total Bilirubin: 0.7 mg/dL (ref 0.3–1.2)
Total Protein: 7.3 g/dL (ref 6.5–8.1)

## 2018-07-28 LAB — LACTIC ACID, PLASMA
Lactic Acid, Venous: 1.9 mmol/L (ref 0.5–1.9)
Lactic Acid, Venous: 2 mmol/L (ref 0.5–1.9)

## 2018-07-28 LAB — D-DIMER, QUANTITATIVE: D-Dimer, Quant: 1.15 ug/mL-FEU — ABNORMAL HIGH (ref 0.00–0.50)

## 2018-07-28 LAB — SARS CORONAVIRUS 2 BY RT PCR (HOSPITAL ORDER, PERFORMED IN ~~LOC~~ HOSPITAL LAB): SARS Coronavirus 2: NEGATIVE

## 2018-07-28 LAB — TROPONIN I: Troponin I: <0.03 ng/mL (ref <0.03)

## 2018-07-28 LAB — BRAIN NATRIURETIC PEPTIDE: B Natriuretic Peptide: 412.6 pg/mL — ABNORMAL HIGH (ref 0.0–100.0)

## 2018-07-28 MED ORDER — IOHEXOL 350 MG/ML SOLN
100.0000 mL | Freq: Once | INTRAVENOUS | Status: AC | PRN
  Administered 2018-07-28: 100 mL via INTRAVENOUS

## 2018-07-28 MED ORDER — SODIUM CHLORIDE 0.9 % IV SOLN
1.0000 g | Freq: Once | INTRAVENOUS | Status: AC
  Administered 2018-07-28: 1 g via INTRAVENOUS
  Filled 2018-07-28: qty 10

## 2018-07-28 MED ORDER — SODIUM CHLORIDE 0.9 % IV SOLN
500.0000 mg | Freq: Once | INTRAVENOUS | Status: AC
  Administered 2018-07-28: 500 mg via INTRAVENOUS
  Filled 2018-07-28: qty 500

## 2018-07-28 NOTE — ED Notes (Signed)
Informed provider of lactic 

## 2018-07-28 NOTE — ED Notes (Signed)
Patient transported to CT 

## 2018-07-28 NOTE — ED Provider Notes (Signed)
Trevor Williams   CSN: [id number] Arrival date & time:       History   Chief Complaint Chief Complaint  Patient presents with   Respiratory Distress    HPI Trevor Williams is a 58 y.o. male.     The history is provided by the patient and medical records. No language interpreter was used.  Shortness of Breath  Severity:  Severe Onset quality:  Gradual Duration:  1 day Timing:  Constant Progression:  Unchanged Chronicity:  Recurrent Context: URI   Context comment:  Cough Relieved by:  Nothing Worsened by:  Nothing Ineffective treatments:  None tried Associated symptoms: chest pain, cough, fever, sputum production and wheezing   Associated symptoms: no abdominal pain, no diaphoresis, no headaches, no neck pain, no rash and no vomiting   Risk factors: no hx of PE/DVT     Past Medical History:  Diagnosis Date   Cancer ([place])    skin (left hand)   Emphysema of lung ([place])     There are no active problems to display for this patient.   No past surgical history on file.      Home Medications    Prior to Admission medications   Medication Sig Start Date End Date Taking? Authorizing Provider  Aspirin-Acetaminophen-Caffeine (GOODY HEADACHE PO) Take 2 packets by mouth daily as needed (pain/headache).    [provider]  doxycycline (VIBRAMYCIN) 100 MG capsule Take 1 capsule (100 mg total) by mouth 2 (two) times daily. 06/01/18   Ashley Murrain, NP  potassium chloride (K-DUR) 10 MEQ tablet Take 2 tablets (20 mEq total) by mouth daily for 3 days. 03/04/18 03/07/18  Corrie Dandy, MD  predniSONE (DELTASONE) 10 MG tablet Take 4 tablets (40 mg total) by mouth daily. 06/01/18   Ashley Murrain, NP    Family History No family history on file.  Social History Social History   Tobacco Use   Smoking status: Current Every Day Smoker    Packs/day: 1.00    Types: Cigarettes   Smokeless tobacco: Current User    Substance Use Topics   Alcohol use: Yes   Drug use: No     Allergies   Ibuprofen and Tylenol [acetaminophen]   Review of Systems Review of Systems  Constitutional: Positive for chills, fatigue and fever. Negative for diaphoresis.  HENT: Positive for congestion.   Eyes: Negative for visual disturbance.  Respiratory: Positive for cough, sputum production, chest tightness, shortness of breath and wheezing.   Cardiovascular: Positive for chest pain. Negative for palpitations and leg swelling.  Gastrointestinal: Negative for abdominal pain, constipation, diarrhea, nausea and vomiting.  Genitourinary: Negative for flank pain.  Musculoskeletal: Negative for back pain, neck pain and neck stiffness.  Skin: Negative for rash and wound.  Neurological: Negative for light-headedness and headaches.  Psychiatric/Behavioral: Negative for agitation.  All other systems reviewed and are negative.    Physical Exam Updated Vital Signs BP 110/72    Pulse 84    Temp 98.4 F (36.9 C) (Oral)    Resp 19    SpO2 96%   Physical Exam Vitals signs and nursing Williams reviewed.  Constitutional:      General: He is not in acute distress.    Appearance: He is well-developed. He is not ill-appearing, toxic-appearing or diaphoretic.  HENT:     Head: Normocephalic and atraumatic.     Nose: No congestion or rhinorrhea.     Mouth/Throat:     Pharynx: No  oropharyngeal exudate or posterior oropharyngeal erythema.  Eyes:     Conjunctiva/sclera: Conjunctivae normal.     Pupils: Pupils are equal, round, and reactive to light.  Neck:     Musculoskeletal: Neck supple.  Cardiovascular:     Rate and Rhythm: Regular rhythm. Tachycardia present.     Pulses: Normal pulses.     Heart sounds: No murmur.  Pulmonary:     Effort: Pulmonary effort is normal. No respiratory distress.     Breath sounds: Rhonchi present. No wheezing or rales.  Chest:     Chest wall: No tenderness.  Abdominal:     General: Abdomen is  flat.     Palpations: Abdomen is soft.     Tenderness: There is no abdominal tenderness.  Musculoskeletal:        General: No tenderness.     Right lower leg: No edema.     Left lower leg: No edema.  Skin:    General: Skin is warm and dry.     Capillary Refill: Capillary refill takes less than 2 seconds.     Coloration: Skin is not pale.  Neurological:     General: No focal deficit present.     Mental Status: He is alert.     Sensory: No sensory deficit.     Motor: No weakness.  Psychiatric:        Mood and Affect: Mood normal.      ED Treatments / Results  Labs (all labs ordered are listed, but only abnormal results are displayed) Labs Reviewed  CBC WITH DIFFERENTIAL/PLATELET - Abnormal; Notable for the following components:      Result Value   RBC 3.04 (*)    Hemoglobin 11.2 (*)    HCT 33.6 (*)    MCV 110.5 (*)    MCH 36.8 (*)    Platelets 47 (*)    Neutro Abs 1.5 (*)    All other components within normal limits  COMPREHENSIVE METABOLIC PANEL - Abnormal; Notable for the following components:   CO2 20 (*)    Creatinine, Ser 0.59 (*)    Calcium 8.8 (*)    AST 197 (*)    ALT 74 (*)    Anion gap 16 (*)    All other components within normal limits  LACTIC ACID, PLASMA - Abnormal; Notable for the following components:   Lactic Acid, Venous 2.0 (*)    All other components within normal limits  BRAIN NATRIURETIC PEPTIDE - Abnormal; Notable for the following components:   B Natriuretic Peptide 412.6 (*)    All other components within normal limits  D-DIMER, QUANTITATIVE (NOT AT [facility]) - Abnormal; Notable for the following components:   D-Dimer, Quant 1.15 (*)    All other components within normal limits  LACTIC ACID, PLASMA - Abnormal; Notable for the following components:   Lactic Acid, Venous 2.1 (*)    All other components within normal limits  SARS CORONAVIRUS 2 (HOSPITAL ORDER, [place] [facility])  CULTURE, BLOOD (ROUTINE X 2)  CULTURE, BLOOD  (ROUTINE X 2)  LACTIC ACID, PLASMA  TROPONIN I  HIV ANTIBODY (ROUTINE TESTING W REFLEX)    EKG EKG Interpretation  Date/Time:  Sunday Jul 28 2018 20:02:53 EDT Ventricular Rate:  87 PR Interval:    QRS Duration: 93 QT Interval:  382 QTC Calculation: 460 R Axis:   41 Text Interpretation:  Sinus rhythm with artifact Repol abnrm suggests ischemia, lateral leads When compared to prior, more artifact.  No  STEMI Confirmed by Antony Blackbird [phone]) on 07/28/2018 8:05:35 PM   Radiology Ct Angio Chest Pe W And/or Wo Contrast  Result Date: 07/28/2018 CLINICAL DATA:  Shortness of breath, evaluate for PE EXAM: CT ANGIOGRAPHY CHEST WITH CONTRAST TECHNIQUE: Multidetector CT imaging of the chest was performed using the standard protocol during bolus administration of intravenous contrast. Multiplanar CT image reconstructions and MIPs were obtained to evaluate the vascular anatomy. CONTRAST:  152mL OMNIPAQUE IOHEXOL 350 MG/ML SOLN COMPARISON:  12/24/2014 FINDINGS: Cardiovascular: Satisfactory opacification of the pulmonary arteries to the segmental level. No evidence of pulmonary embolism. Scattered coronary artery calcifications. Normal heart size. No pericardial effusion. Mediastinum/Nodes: No enlarged mediastinal, hilar, or axillary lymph nodes. Thyroid gland, trachea, and esophagus demonstrate no significant findings. Lungs/Pleura: Moderate centrilobular emphysema. There is clustered centrilobular and ground-glass opacity of the right upper lobe (series 7, image 76) and of the superior segment left lower lobe (series 7, image 82). Diffuse bilateral bronchial wall thickening. No pleural effusion or pneumothorax. Upper Abdomen: No acute abnormality. Musculoskeletal: No chest wall abnormality. No acute or significant osseous findings. Review of the MIP images confirms the above findings. IMPRESSION: 1.  Negative examination for pulmonary embolism. 2. There is clustered centrilobular and ground-glass opacity of  the right upper lobe (series 7, image 76) and of the superior segment left lower lobe (series 7, image 82), consistent with multifocal infection. 3. Diffuse bilateral nonspecific infectious or inflammatory bronchial wall thickening. 4.  Emphysema. 5.  Coronary artery disease. Electronically Signed   By: Eddie Candle M.D.   On: 07/28/2018 21:58   Dg Chest Portable 1 View  Result Date: 07/28/2018 CLINICAL DATA:  Shortness of breath and cough EXAM: PORTABLE CHEST 1 VIEW COMPARISON:  06/01/2018 FINDINGS: Cardiac shadow is stable. Aortic calcifications are again seen. The lungs are well aerated bilaterally. No acute infiltrate is seen. Old rib fractures are noted on the left. IMPRESSION: No active disease. Electronically Signed   By: Inez Catalina M.D.   On: 07/28/2018 20:34    Procedures Procedures (including critical care time)  Trevor Williams was evaluated in Emergency Department on 07/28/2018 for the symptoms described in the history of present illness. He was evaluated in the context of the global COVID-19 pandemic, which necessitated consideration that the patient might be at risk for infection with the SARS-CoV-2 virus that causes COVID-19. Institutional protocols and algorithms that pertain to the evaluation of patients at risk for COVID-19 are in a state of rapid change based on information released by regulatory bodies including the CDC and federal and state organizations. These policies and algorithms were followed during the patient's care in the ED.  CRITICAL CARE Performed by: Gwenyth Allegra Esli Jernigan Total critical care time: 35 minutes Critical care time was exclusive of separately billable procedures and treating other patients. Critical care was necessary to treat or prevent imminent or life-threatening deterioration. Critical care was time spent personally by me on the following activities: development of treatment plan with patient and/or surrogate as well as nursing, discussions with  consultants, evaluation of patient's response to treatment, examination of patient, obtaining history from patient or surrogate, ordering and performing treatments and interventions, ordering and review of laboratory studies, ordering and review of radiographic studies, pulse oximetry and re-evaluation of patient's condition.   Medications Ordered in ED Medications  cefTRIAXone (ROCEPHIN) 1 g in sodium chloride 0.9 % 100 mL IVPB (has no administration in time range)  azithromycin (ZITHROMAX) 500 mg in sodium chloride 0.9 % 250 mL IVPB (has no  administration in time range)  iohexol (OMNIPAQUE) 350 MG/ML injection 100 mL (100 mLs Intravenous Contrast Given 07/28/18 2152)     Initial Impression / Assessment and Plan / ED Course  I have reviewed the triage vital signs and the nursing notes.  Pertinent labs & imaging results that were available during my care of the patient were reviewed by me and considered in my medical decision making (see chart for details).        Trevor Williams is a 58 y.o. male with a past medical history significant for emphysema who presents with shortness of breath.  Patient is brought in by EMS after he was having a suspected COPD exacerbation.  Patient does smoke.  Patient was found to have oxygen saturations in the 80s and put on 15 L nonrebreather.  He was given IM epinephrine, Solu-Medrol, 2 g of magnesium, and albuterol.  Patient still reports feeling short of breath but his wheezing has resolved on arrival.  Patient reports some chest pressure and chest discomfort and chest tightness.  He does say he has had some mild chills but denies any fevers at home.  He reports he has had a cough that is been ongoing for months.  He denies any nausea vomiting, urinary symptoms or GI symptoms.  He reports chronic leg pains but no history of DVT or PE.  He denies any leg swelling.  On exam, lungs are clear, no wheezing.  Patient is tachypneic and complaining of the chest  discomfort.  Chest was nontender.  No murmur appreciated.  Abdomen was nontender.  Legs were nonedematous and nontender.  Patient had symmetric radial pulses.  Patient was on 2 L nasal cannula to maintain oxygen saturations.  EKG showed sinus rhythm with artifact.  Patient was shaking during EKG collection.  Clinically I suspect patient having COPD exacerbation however will obtain labs and imaging to further evaluate for the cause of his symptoms.  With his chest pain, tachycardia on arrival, and hypoxia, will obtain d-dimer as well.  Patient will be screened for coronavirus given the current pandemic.  He or may not oxygen.  He reports is not take oxygen at home and if he does not improve his oxygen saturations, he will likely require admission.  Patient may need more albuterol if his wheezing recurs.  Anticipate admission.  10:07 PM Patient's d-dimer was elevated so we ordered a PE study.  PE study showed no pulmonary embolism but did reveal multifocal pneumonia.  Patient will be started on antibiotics and admitted for multifocal pneumonia causing shortness of breath and hypoxia.  Patient will be admitted.   Final Clinical Impressions(s) / ED Diagnoses   Final diagnoses:  Multifocal pneumonia  COPD exacerbation ([place])  Hypoxia  Shortness of breath    ED Discharge Orders    None      Clinical Impression: 1. Multifocal pneumonia   2. COPD exacerbation ([place])   3. Hypoxia   4. Shortness of breath     Disposition: Admit  This Williams was prepared with assistance of Dragon voice recognition software. Occasional wrong-word or sound-a-like substitutions may have occurred due to the inherent limitations of voice recognition software.     Kathalina Ostermann, Gwenyth Allegra, MD 07/29/18 [phone]

## 2018-07-28 NOTE — ED Triage Notes (Signed)
Per EMS, pt called for SOB, hx of COPD still smokes a pack a day.  Initial O2 was 86%, placed on 15L non-rebreather.  Gave epi IM, 125 Solumedual IV, 2g Mag IM.  Still reports labored breathing.

## 2018-07-29 ENCOUNTER — Encounter (HOSPITAL_COMMUNITY): Payer: Self-pay | Admitting: Internal Medicine

## 2018-07-29 ENCOUNTER — Other Ambulatory Visit: Payer: Self-pay

## 2018-07-29 DIAGNOSIS — F10239 Alcohol dependence with withdrawal, unspecified: Secondary | ICD-10-CM | POA: Diagnosis present

## 2018-07-29 DIAGNOSIS — Z8249 Family history of ischemic heart disease and other diseases of the circulatory system: Secondary | ICD-10-CM | POA: Diagnosis not present

## 2018-07-29 DIAGNOSIS — Z590 Homelessness: Secondary | ICD-10-CM | POA: Diagnosis not present

## 2018-07-29 DIAGNOSIS — D638 Anemia in other chronic diseases classified elsewhere: Secondary | ICD-10-CM | POA: Diagnosis present

## 2018-07-29 DIAGNOSIS — E872 Acidosis: Secondary | ICD-10-CM | POA: Diagnosis not present

## 2018-07-29 DIAGNOSIS — J9601 Acute respiratory failure with hypoxia: Secondary | ICD-10-CM | POA: Diagnosis present

## 2018-07-29 DIAGNOSIS — J984 Other disorders of lung: Secondary | ICD-10-CM | POA: Diagnosis not present

## 2018-07-29 DIAGNOSIS — J432 Centrilobular emphysema: Secondary | ICD-10-CM | POA: Diagnosis present

## 2018-07-29 DIAGNOSIS — D6959 Other secondary thrombocytopenia: Secondary | ICD-10-CM | POA: Diagnosis present

## 2018-07-29 DIAGNOSIS — Z85828 Personal history of other malignant neoplasm of skin: Secondary | ICD-10-CM | POA: Diagnosis not present

## 2018-07-29 DIAGNOSIS — Z881 Allergy status to other antibiotic agents status: Secondary | ICD-10-CM | POA: Diagnosis not present

## 2018-07-29 DIAGNOSIS — I081 Rheumatic disorders of both mitral and tricuspid valves: Secondary | ICD-10-CM | POA: Diagnosis present

## 2018-07-29 DIAGNOSIS — J181 Lobar pneumonia, unspecified organism: Secondary | ICD-10-CM | POA: Diagnosis not present

## 2018-07-29 DIAGNOSIS — Z886 Allergy status to analgesic agent status: Secondary | ICD-10-CM | POA: Diagnosis not present

## 2018-07-29 DIAGNOSIS — I110 Hypertensive heart disease with heart failure: Secondary | ICD-10-CM | POA: Diagnosis present

## 2018-07-29 DIAGNOSIS — E43 Unspecified severe protein-calorie malnutrition: Secondary | ICD-10-CM | POA: Diagnosis present

## 2018-07-29 DIAGNOSIS — J189 Pneumonia, unspecified organism: Secondary | ICD-10-CM | POA: Diagnosis not present

## 2018-07-29 DIAGNOSIS — D509 Iron deficiency anemia, unspecified: Secondary | ICD-10-CM | POA: Diagnosis present

## 2018-07-29 DIAGNOSIS — R05 Cough: Secondary | ICD-10-CM | POA: Diagnosis not present

## 2018-07-29 DIAGNOSIS — J90 Pleural effusion, not elsewhere classified: Secondary | ICD-10-CM | POA: Diagnosis not present

## 2018-07-29 DIAGNOSIS — Z4682 Encounter for fitting and adjustment of non-vascular catheter: Secondary | ICD-10-CM | POA: Diagnosis not present

## 2018-07-29 DIAGNOSIS — R21 Rash and other nonspecific skin eruption: Secondary | ICD-10-CM | POA: Diagnosis not present

## 2018-07-29 DIAGNOSIS — F17210 Nicotine dependence, cigarettes, uncomplicated: Secondary | ICD-10-CM | POA: Diagnosis present

## 2018-07-29 DIAGNOSIS — R739 Hyperglycemia, unspecified: Secondary | ICD-10-CM | POA: Diagnosis not present

## 2018-07-29 DIAGNOSIS — Z681 Body mass index (BMI) 19 or less, adult: Secondary | ICD-10-CM | POA: Diagnosis not present

## 2018-07-29 DIAGNOSIS — G9341 Metabolic encephalopathy: Secondary | ICD-10-CM | POA: Diagnosis not present

## 2018-07-29 DIAGNOSIS — Z20828 Contact with and (suspected) exposure to other viral communicable diseases: Secondary | ICD-10-CM | POA: Diagnosis present

## 2018-07-29 DIAGNOSIS — R160 Hepatomegaly, not elsewhere classified: Secondary | ICD-10-CM | POA: Diagnosis not present

## 2018-07-29 DIAGNOSIS — I5023 Acute on chronic systolic (congestive) heart failure: Secondary | ICD-10-CM | POA: Diagnosis present

## 2018-07-29 DIAGNOSIS — I959 Hypotension, unspecified: Secondary | ICD-10-CM | POA: Diagnosis not present

## 2018-07-29 DIAGNOSIS — J441 Chronic obstructive pulmonary disease with (acute) exacerbation: Secondary | ICD-10-CM | POA: Diagnosis not present

## 2018-07-29 DIAGNOSIS — R0902 Hypoxemia: Secondary | ICD-10-CM | POA: Diagnosis not present

## 2018-07-29 DIAGNOSIS — J810 Acute pulmonary edema: Secondary | ICD-10-CM | POA: Diagnosis not present

## 2018-07-29 DIAGNOSIS — R0602 Shortness of breath: Secondary | ICD-10-CM | POA: Diagnosis not present

## 2018-07-29 LAB — RESPIRATORY PANEL BY PCR

## 2018-07-29 LAB — HEPATIC FUNCTION PANEL
ALT: 75 U/L — ABNORMAL HIGH (ref 0–44)
AST: 175 U/L — ABNORMAL HIGH (ref 15–41)
Albumin: 3.6 g/dL (ref 3.5–5.0)
Alkaline Phosphatase: 109 U/L (ref 38–126)
Bilirubin, Direct: 0.2 mg/dL (ref 0.0–0.2)
Indirect Bilirubin: 0.5 mg/dL (ref 0.3–0.9)
Total Bilirubin: 0.7 mg/dL (ref 0.3–1.2)
Total Protein: 7.6 g/dL (ref 6.5–8.1)

## 2018-07-29 LAB — CBC WITH DIFFERENTIAL/PLATELET
Abs Immature Granulocytes: 0.01 10*3/uL (ref 0.00–0.07)
Basophils Absolute: 0 10*3/uL (ref 0.0–0.1)
Basophils Relative: 1 %
Eosinophils Absolute: 0 10*3/uL (ref 0.0–0.5)
Eosinophils Relative: 1 %
HCT: 33.6 % — ABNORMAL LOW (ref 39.0–52.0)
Hemoglobin: 11.2 g/dL — ABNORMAL LOW (ref 13.0–17.0)
Immature Granulocytes: 1 %
Lymphocytes Relative: 24 %
Lymphs Abs: 0.5 10*3/uL — ABNORMAL LOW (ref 0.7–4.0)
MCH: 36.7 pg — ABNORMAL HIGH (ref 26.0–34.0)
MCHC: 33.3 g/dL (ref 30.0–36.0)
MCV: 110.2 fL — ABNORMAL HIGH (ref 80.0–100.0)
Monocytes Absolute: 0.1 10*3/uL (ref 0.1–1.0)
Monocytes Relative: 4 %
Neutro Abs: 1.4 10*3/uL — ABNORMAL LOW (ref 1.7–7.7)
Neutrophils Relative %: 69 %
Platelets: 46 10*3/uL — ABNORMAL LOW (ref 150–400)
RBC: 3.05 MIL/uL — ABNORMAL LOW (ref 4.22–5.81)
RDW: 12.5 % (ref 11.5–15.5)
Smear Review: DECREASED
WBC: 2 10*3/uL — ABNORMAL LOW (ref 4.0–10.5)
nRBC: 0 % (ref 0.0–0.2)

## 2018-07-29 LAB — BASIC METABOLIC PANEL
Anion gap: 17 — ABNORMAL HIGH (ref 5–15)
BUN: 8 mg/dL (ref 6–20)
CO2: 22 mmol/L (ref 22–32)
Calcium: 8.2 mg/dL — ABNORMAL LOW (ref 8.9–10.3)
Chloride: 103 mmol/L (ref 98–111)
Creatinine, Ser: 0.72 mg/dL (ref 0.61–1.24)
GFR calc Af Amer: >60 mL/min (ref >60)
GFR calc non Af Amer: >60 mL/min (ref >60)
Glucose, Bld: 126 mg/dL — ABNORMAL HIGH (ref 70–99)
Potassium: 4 mmol/L (ref 3.5–5.1)
Sodium: 142 mmol/L (ref 135–145)

## 2018-07-29 LAB — EXPECTORATED SPUTUM ASSESSMENT W GRAM STAIN, RFLX TO RESP C

## 2018-07-29 LAB — IRON AND TIBC
Iron: 134 ug/dL (ref 45–182)
Saturation Ratios: 50 % — ABNORMAL HIGH (ref 17.9–39.5)
TIBC: 270 ug/dL (ref 250–450)
UIBC: 136 ug/dL

## 2018-07-29 LAB — RETICULOCYTES
Immature Retic Fract: 11.1 % (ref 2.3–15.9)
RBC.: 2.86 MIL/uL — ABNORMAL LOW (ref 4.22–5.81)
Retic Count, Absolute: 25.7 10*3/uL (ref 19.0–186.0)
Retic Ct Pct: 0.9 % (ref 0.4–3.1)

## 2018-07-29 LAB — FOLATE: Folate: 5.2 ng/mL — ABNORMAL LOW (ref >5.9)

## 2018-07-29 LAB — LACTIC ACID, PLASMA: Lactic Acid, Venous: 2.1 mmol/L (ref 0.5–1.9)

## 2018-07-29 LAB — HIV ANTIBODY (ROUTINE TESTING W REFLEX): HIV Screen 4th Generation wRfx: NONREACTIVE

## 2018-07-29 LAB — FERRITIN: Ferritin: 1371 ng/mL — ABNORMAL HIGH (ref 24–336)

## 2018-07-29 LAB — TROPONIN I
Troponin I: <0.03 ng/mL (ref <0.03)
Troponin I: <0.03 ng/mL (ref <0.03)
Troponin I: 0.03 ng/mL (ref <0.03)

## 2018-07-29 LAB — LACTATE DEHYDROGENASE: LDH: 240 U/L — ABNORMAL HIGH (ref 98–192)

## 2018-07-29 LAB — C-REACTIVE PROTEIN: CRP: <0.8 mg/dL (ref <1.0)

## 2018-07-29 LAB — VITAMIN B12: Vitamin B-12: 394 pg/mL (ref 180–914)

## 2018-07-29 MED ORDER — ADULT MULTIVITAMIN W/MINERALS CH
1.0000 | ORAL_TABLET | Freq: Every day | ORAL | Status: DC
  Filled 2018-07-29: qty 1

## 2018-07-29 MED ORDER — NICOTINE 21 MG/24HR TD PT24
21.0000 mg | MEDICATED_PATCH | Freq: Every day | TRANSDERMAL | Status: DC
  Administered 2018-07-29 – 2018-08-12 (×15): 21 mg via TRANSDERMAL
  Filled 2018-07-29 (×16): qty 1

## 2018-07-29 MED ORDER — NICOTINE POLACRILEX 2 MG MT GUM
2.0000 mg | CHEWING_GUM | OROMUCOSAL | Status: DC | PRN
  Filled 2018-07-29: qty 1

## 2018-07-29 MED ORDER — LORAZEPAM 1 MG PO TABS
1.0000 mg | ORAL_TABLET | Freq: Four times a day (QID) | ORAL | Status: DC | PRN
  Administered 2018-07-29: 1 mg via ORAL
  Filled 2018-07-29: qty 1

## 2018-07-29 MED ORDER — VITAMIN B-1 100 MG PO TABS
100.0000 mg | ORAL_TABLET | Freq: Every day | ORAL | Status: DC
  Administered 2018-07-29: 100 mg via ORAL
  Filled 2018-07-29: qty 1

## 2018-07-29 MED ORDER — ALBUTEROL SULFATE (2.5 MG/3ML) 0.083% IN NEBU: 2.5000 mg | INHALATION_SOLUTION | RESPIRATORY_TRACT | Status: DC

## 2018-07-29 MED ORDER — THIAMINE HCL 100 MG/ML IJ SOLN
100.0000 mg | Freq: Every day | INTRAMUSCULAR | Status: DC
  Filled 2018-07-29: qty 2

## 2018-07-29 MED ORDER — LORAZEPAM 2 MG/ML IJ SOLN: 1.0000 mg | Freq: Four times a day (QID) | INTRAMUSCULAR | Status: DC | PRN

## 2018-07-29 MED ORDER — LORAZEPAM 1 MG PO TABS
1.0000 mg | ORAL_TABLET | Freq: Four times a day (QID) | ORAL | Status: DC | PRN
  Administered 2018-07-29 (×2): 1 mg via ORAL
  Filled 2018-07-29 (×2): qty 1

## 2018-07-29 MED ORDER — LORAZEPAM 2 MG/ML IJ SOLN
0.0000 mg | Freq: Four times a day (QID) | INTRAMUSCULAR | Status: DC
  Administered 2018-07-29: 4 mg via INTRAVENOUS
  Administered 2018-07-29: 1 mg via INTRAVENOUS
  Administered 2018-07-30: 2 mg via INTRAVENOUS
  Filled 2018-07-29 (×2): qty 1
  Filled 2018-07-29: qty 2

## 2018-07-29 MED ORDER — ALBUTEROL SULFATE HFA 108 (90 BASE) MCG/ACT IN AERS
2.0000 | INHALATION_SPRAY | Freq: Three times a day (TID) | RESPIRATORY_TRACT | Status: DC
  Administered 2018-07-29 (×2): 2 via RESPIRATORY_TRACT

## 2018-07-29 MED ORDER — LORAZEPAM 2 MG/ML IJ SOLN
1.0000 mg | Freq: Four times a day (QID) | INTRAMUSCULAR | Status: DC | PRN
  Administered 2018-07-29: 1 mg via INTRAVENOUS
  Filled 2018-07-29: qty 1

## 2018-07-29 MED ORDER — ALBUTEROL SULFATE HFA 108 (90 BASE) MCG/ACT IN AERS
2.0000 | INHALATION_SPRAY | RESPIRATORY_TRACT | Status: DC | PRN
  Administered 2018-07-30: 2 via RESPIRATORY_TRACT

## 2018-07-29 MED ORDER — LORAZEPAM 2 MG/ML IJ SOLN: 0.0000 mg | Freq: Two times a day (BID) | INTRAMUSCULAR | Status: DC

## 2018-07-29 MED ORDER — FOLIC ACID 1 MG PO TABS
1.0000 mg | ORAL_TABLET | Freq: Every day | ORAL | Status: DC
  Administered 2018-07-29: 1 mg via ORAL
  Filled 2018-07-29 (×2): qty 1

## 2018-07-29 MED ORDER — SODIUM CHLORIDE 0.9 % IV SOLN
1.0000 g | INTRAVENOUS | Status: DC
  Administered 2018-07-29: 1 g via INTRAVENOUS
  Filled 2018-07-29 (×2): qty 10

## 2018-07-29 MED ORDER — ALBUTEROL SULFATE HFA 108 (90 BASE) MCG/ACT IN AERS
2.0000 | INHALATION_SPRAY | RESPIRATORY_TRACT | Status: DC
  Administered 2018-07-29: 2 via RESPIRATORY_TRACT
  Filled 2018-07-29 (×2): qty 6.7

## 2018-07-29 MED ORDER — ALBUTEROL SULFATE (2.5 MG/3ML) 0.083% IN NEBU: 3.0000 mL | INHALATION_SOLUTION | RESPIRATORY_TRACT | Status: DC | PRN

## 2018-07-29 MED ORDER — LORAZEPAM 2 MG/ML IJ SOLN: 0.0000 mg | Freq: Four times a day (QID) | INTRAMUSCULAR | Status: DC

## 2018-07-29 MED ORDER — SODIUM CHLORIDE 0.9 % IV SOLN
500.0000 mg | INTRAVENOUS | Status: DC
  Administered 2018-07-29 – 2018-07-30 (×2): 500 mg via INTRAVENOUS
  Filled 2018-07-29 (×4): qty 500

## 2018-07-29 MED ORDER — ADULT MULTIVITAMIN W/MINERALS CH
1.0000 | ORAL_TABLET | Freq: Every day | ORAL | Status: DC
  Administered 2018-07-29 – 2018-08-12 (×14): 1 via ORAL
  Filled 2018-07-29 (×16): qty 1

## 2018-07-29 MED ORDER — NICOTINE 21 MG/24HR TD PT24: 21.0000 mg | MEDICATED_PATCH | Freq: Every day | TRANSDERMAL | Status: DC

## 2018-07-29 MED ORDER — ALBUTEROL SULFATE (2.5 MG/3ML) 0.083% IN NEBU: 3.0000 mL | INHALATION_SOLUTION | RESPIRATORY_TRACT | Status: DC

## 2018-07-29 MED ORDER — FOLIC ACID 1 MG PO TABS
1.0000 mg | ORAL_TABLET | Freq: Every day | ORAL | Status: DC
  Administered 2018-07-29 – 2018-08-01 (×4): 1 mg via ORAL
  Filled 2018-07-29 (×6): qty 1

## 2018-07-29 MED ORDER — THIAMINE HCL 100 MG/ML IJ SOLN
100.0000 mg | Freq: Every day | INTRAMUSCULAR | Status: DC
  Administered 2018-07-30: 16:00:00 100 mg via INTRAVENOUS
  Filled 2018-07-29 (×3): qty 2

## 2018-07-29 MED ORDER — VITAMIN B-1 100 MG PO TABS
100.0000 mg | ORAL_TABLET | Freq: Every day | ORAL | Status: DC
  Administered 2018-07-31 – 2018-08-01 (×2): 100 mg via ORAL
  Filled 2018-07-29 (×4): qty 1

## 2018-07-29 NOTE — ED Notes (Signed)
Dr. Sherry Ruffing and Anderson Malta, Primary RN notified of Lactic Acid 2.1

## 2018-07-29 NOTE — ED Notes (Signed)
Attempted to call report

## 2018-07-29 NOTE — Progress Notes (Signed)
Attempted to reapply tele leads due to pt pulling on leads and monitor box.  Pt has been agitated and noncompliant with tele.

## 2018-07-29 NOTE — H&P (Signed)
History and Physical    Trevor Williams PFX:[id number] DOB: [birth year] DOA: 07/28/2018  PCP: Marliss Coots, NP  Patient coming from: Home.  Chief Complaint: Shortness of breath.  HPI: Trevor Williams is a 58 y.o. male with history of tobacco abuse and [place] to the ER because of persistent shortness of breath ongoing for last 1 month.  Patient states he is getting short of breath on exertion which is progressively worsened.  At times he also gets some central chest pain which last for few minutes.  EMS was called and patient was placed on nonrebreather given epinephrine Solu-Medrol magnesium and albuterol and was brought to the ER.  ED Course: By the time patient reached ER patient's wheezing is improved.  CT angiogram of the chest was done since d-dimer was elevated.  Shows multifocal pneumonia and changes consistent with emphysema and also coronary artery disease.  COVID-19 test at the hospital was negative.  Patient was started on empiric antibiotics for pneumonia and admitted for further management.  Labs also revealed BNP of 412 troponin less than 0.03 CRP less than 0.8 hemoglobin 11.2 MCV 110 platelets 47.  EKG with normal sinus rhythm with possible lateral wall ischemia.  Review of Systems: As per HPI, rest all negative.   Past Medical History:  Diagnosis Date   Cancer ([place])    skin (left hand)   Emphysema of lung ([place])     History reviewed. No pertinent surgical history.   reports that he has been smoking cigarettes. He has been smoking about 1.00 pack per day. He uses smokeless tobacco. He reports current alcohol use. He reports that he does not use drugs.  Allergies  Allergen Reactions   Ibuprofen Rash   Tylenol [Acetaminophen] Rash    Family History  Problem Relation Age of Onset   Hypertension Maternal Grandfather     Prior to Admission medications   Medication Sig Start Date End Date Taking? Authorizing Provider    Aspirin-Acetaminophen-Caffeine (GOODY HEADACHE PO) Take 2 packets by mouth daily as needed (pain/headache).    [provider]  doxycycline (VIBRAMYCIN) 100 MG capsule Take 1 capsule (100 mg total) by mouth 2 (two) times daily. 06/01/18   Ashley Murrain, NP  potassium chloride (K-DUR) 10 MEQ tablet Take 2 tablets (20 mEq total) by mouth daily for 3 days. 03/04/18 03/07/18  Corrie Dandy, MD  predniSONE (DELTASONE) 10 MG tablet Take 4 tablets (40 mg total) by mouth daily. 06/01/18   Ashley Murrain, NP    Physical Exam: Vitals:   07/28/18 2251 07/28/18 2300 07/29/18 0000 07/29/18 0030  BP: 128/89 125/88 102/74 112/80  Pulse: 83 87 90 83  Resp: 15 16 14 16   Temp:      TempSrc:      SpO2: 100% 99% 100% 100%      Constitutional: Moderately built and nourished. Vitals:   07/28/18 2251 07/28/18 2300 07/29/18 0000 07/29/18 0030  BP: 128/89 125/88 102/74 112/80  Pulse: 83 87 90 83  Resp: 15 16 14 16   Temp:      TempSrc:      SpO2: 100% 99% 100% 100%   Eyes: Anicteric no pallor. ENMT: No discharge from the ears eyes nose or mouth. Neck: No mass felt.  No JVD appreciated. Respiratory: No rhonchi or crepitations. Cardiovascular: S1-S2 heard. Abdomen: Soft nontender bowel sounds present. Musculoskeletal: No edema. Skin: No rash. Neurologic: Alert awake oriented to time place and person.  Moves  all extremities. Psychiatric: Appears normal per normal affect.   Labs on Admission: I have personally reviewed following labs and imaging studies  CBC: Recent Labs  Lab 07/28/18 2012  WBC 4.5  NEUTROABS 1.5*  HGB 11.2*  HCT 33.6*  MCV 110.5*  PLT 47*   Basic Metabolic Panel: Recent Labs  Lab 07/28/18 2012  NA 144  K 3.9  CL 108  CO2 20*  GLUCOSE 98  BUN 11  CREATININE 0.59*  CALCIUM 8.8*   GFR: CrCl cannot be calculated (Unknown ideal weight.). Liver Function Tests: Recent Labs  Lab 07/28/18 2012  AST 197*  ALT 74*  ALKPHOS 107  BILITOT 0.7  PROT 7.3   ALBUMIN 3.6   No results for input(s): LIPASE, AMYLASE in the last 168 hours. No results for input(s): AMMONIA in the last 168 hours. Coagulation Profile: No results for input(s): INR, PROTIME in the last 168 hours. Cardiac Enzymes: Recent Labs  Lab 07/28/18 2012  TROPONINI <0.03   BNP (last 3 results) No results for input(s): PROBNP in the last 8760 hours. HbA1C: No results for input(s): HGBA1C in the last 72 hours. CBG: No results for input(s): GLUCAP in the last 168 hours. Lipid Profile: No results for input(s): CHOL, HDL, LDLCALC, TRIG, CHOLHDL, LDLDIRECT in the last 72 hours. Thyroid Function Tests: No results for input(s): TSH, T4TOTAL, FREET4, T3FREE, THYROIDAB in the last 72 hours. Anemia Panel: No results for input(s): VITAMINB12, FOLATE, FERRITIN, TIBC, IRON, RETICCTPCT in the last 72 hours. Urine analysis:    Component Value Date/Time   COLORURINE YELLOW 06/01/2018 1820   APPEARANCEUR CLEAR 06/01/2018 1820   LABSPEC 1.014 06/01/2018 1820   PHURINE 7.0 06/01/2018 [place] 06/01/2018 1820   HGBUR NEGATIVE 06/01/2018 1820   BILIRUBINUR NEGATIVE 06/01/2018 [place] 06/01/2018 1820   PROTEINUR 30 (A) 06/01/2018 1820   NITRITE NEGATIVE 06/01/2018 1820   LEUKOCYTESUR NEGATIVE 06/01/2018 1820   Sepsis Labs: @LABRCNTIP (procalcitonin:4,lacticidven:4) ) Recent Results (from the past 240 hour(s))  SARS Coronavirus 2 [facility] order, Performed in [place] hospital lab)     Status: None   Collection Time: 07/28/18  8:36 PM  Result Value Ref Range Status   SARS Coronavirus 2 NEGATIVE NEGATIVE Final    Comment: (NOTE) If result is NEGATIVE SARS-CoV-2 target nucleic acids are NOT DETECTED. The SARS-CoV-2 RNA is generally detectable in upper and lower  respiratory specimens during the acute phase of infection. The lowest  concentration of SARS-CoV-2 viral copies this assay can detect is 250  copies / mL. A negative result does not  preclude SARS-CoV-2 infection  and should not be used as the sole basis for treatment or other  patient management decisions.  A negative result may occur with  improper specimen collection / handling, submission of specimen other  than nasopharyngeal swab, presence of viral mutation(s) within the  areas targeted by this assay, and inadequate number of viral copies  (<250 copies / mL). A negative result must be combined with clinical  observations, patient history, and epidemiological information. If result is POSITIVE SARS-CoV-2 target nucleic acids are DETECTED. The SARS-CoV-2 RNA is generally detectable in upper and lower  respiratory specimens dur ing the acute phase of infection.  Positive  results are indicative of active infection with SARS-CoV-2.  Clinical  correlation with patient history and other diagnostic information is  necessary to determine patient infection status.  Positive results do  not rule out bacterial infection or co-infection with other viruses. If  result is PRESUMPTIVE POSTIVE SARS-CoV-2 nucleic acids MAY BE PRESENT.   A presumptive positive result was obtained on the submitted specimen  and confirmed on repeat testing.  While 2019 novel coronavirus  (SARS-CoV-2) nucleic acids may be present in the submitted sample  additional confirmatory testing may be necessary for epidemiological  and / or clinical management purposes  to differentiate between  SARS-CoV-2 and other Sarbecovirus currently known to infect humans.  If clinically indicated additional testing with an alternate test  methodology [phone]) is advised. The SARS-CoV-2 RNA is generally  detectable in upper and lower respiratory sp ecimens during the acute  phase of infection. The expected result is Negative. Fact Sheet for Patients:  StrictlyIdeas.no Fact Sheet for Healthcare Providers: BankingDealers.co.za This test is not yet approved or cleared by  the [place] FDA and has been authorized for detection and/or diagnosis of SARS-CoV-2 by FDA under an Emergency Use Authorization (EUA).  This EUA will remain in effect (meaning this test can be used) for the duration of the COVID-19 declaration under Section 564(b)(1) of the Act, 21 U.S.C. section 360bbb-3(b)(1), unless the authorization is terminated or revoked sooner. Performed at [facility], [place] [address], [place], [place] [address]      Radiological Exams on Admission: Ct Angio Chest Pe W And/or Wo Contrast  Result Date: 07/28/2018 CLINICAL DATA:  Shortness of breath, evaluate for PE EXAM: CT ANGIOGRAPHY CHEST WITH CONTRAST TECHNIQUE: Multidetector CT imaging of the chest was performed using the standard protocol during bolus administration of intravenous contrast. Multiplanar CT image reconstructions and MIPs were obtained to evaluate the vascular anatomy. CONTRAST:  153mL OMNIPAQUE IOHEXOL 350 MG/ML SOLN COMPARISON:  12/24/2014 FINDINGS: Cardiovascular: Satisfactory opacification of the pulmonary arteries to the segmental level. No evidence of pulmonary embolism. Scattered coronary artery calcifications. Normal heart size. No pericardial effusion. Mediastinum/Nodes: No enlarged mediastinal, hilar, or axillary lymph nodes. Thyroid gland, trachea, and esophagus demonstrate no significant findings. Lungs/Pleura: Moderate centrilobular emphysema. There is clustered centrilobular and ground-glass opacity of the right upper lobe (series 7, image 76) and of the superior segment left lower lobe (series 7, image 82). Diffuse bilateral bronchial wall thickening. No pleural effusion or pneumothorax. Upper Abdomen: No acute abnormality. Musculoskeletal: No chest wall abnormality. No acute or significant osseous findings. Review of the MIP images confirms the above findings. IMPRESSION: 1.  Negative examination for pulmonary embolism. 2. There is clustered centrilobular and ground-glass  opacity of the right upper lobe (series 7, image 76) and of the superior segment left lower lobe (series 7, image 82), consistent with multifocal infection. 3. Diffuse bilateral nonspecific infectious or inflammatory bronchial wall thickening. 4.  Emphysema. 5.  Coronary artery disease. Electronically Signed   By: Eddie Candle M.D.   On: 07/28/2018 21:58   Dg Chest Portable 1 View  Result Date: 07/28/2018 CLINICAL DATA:  Shortness of breath and cough EXAM: PORTABLE CHEST 1 VIEW COMPARISON:  06/01/2018 FINDINGS: Cardiac shadow is stable. Aortic calcifications are again seen. The lungs are well aerated bilaterally. No acute infiltrate is seen. Old rib fractures are noted on the left. IMPRESSION: No active disease. Electronically Signed   By: Inez Catalina M.D.   On: 07/28/2018 20:34    EKG: Independently reviewed.  Normal sinus rhythm with lateral wall repolarization changes concerning for ischemia.  Assessment/Plan Principal Problem:   Acute respiratory failure with hypoxia (HCC) Active Problems:   Community acquired pneumonia    1. Acute respiratory failure with hypoxia secondary to pneumonia and possible  COPD.  Patient's BNP is mildly elevated.  At this time patient is placed on empiric antibiotics for community-acquired pneumonia.  COVID-19 test was negative in the ER.  However we have sent another COVID-19 test to outside lab.  Check respiratory viral panel.  Albuterol.  Patient eventually may need 2D echo while second core test is negative to rule out CHF. 2. Chest pain -patient has been having chest pain off and on.  Most of the symptoms are exertional.  CT scan of the chest also shows possible coronary disease.  We will cycle cardiac markers.  If COVID-19 test come back negative will need 2D echo.  Since patient's platelets are low I have not placed patient on aspirin.  May need cardiac input eventually. 3. Thrombocytopenia likely from alcoholism.  Follow CBC. 4. Microcytic anemia check  anemia panel. 5. Tobacco abuse -tobacco cessation counseling requested. 6. Alcohol abuse -on CIWA protocol.   DVT prophylaxis: SCDs due to severe thrombocytopenia. Code Status: Full code. Family Communication: Discussed with patient. Disposition Plan: Home. Consults called: None. Admission status: Inpatient.   [name] MD [facility] Pager [phone].  If 7PM-7AM, please contact night-coverage [email or web address] Password [email or web address]  07/29/2018, 1:15 AM

## 2018-07-29 NOTE — ED Notes (Signed)
ED TO INPATIENT HANDOFF REPORT  ED Nurse Name and Phone #: Kathie Rhodes RN [phone]  S Name/Age/Gender Trevor Williams 58 y.o. male Room/Bed: RESUSC/RESUSC  Code Status   Code Status: Not on file  Home/SNF/Other Home Patient oriented to: self, place, time and situation Is this baseline? Yes   Triage Complete: Triage complete  Chief Complaint Resp distress  Triage Note Per EMS, pt called for SOB, hx of COPD still smokes a pack a day.  Initial O2 was 86%, placed on 15L non-rebreather.  Gave epi IM, 125 Solumedual IV, 2g Mag IM.  Still reports labored breathing.     Allergies Allergies  Allergen Reactions  . Ibuprofen Rash  . Tylenol [Acetaminophen] Rash    Level of Care/Admitting Diagnosis ED Disposition    ED Disposition Condition Comment   Admit  Hospital Area: [facility] [[id number]]  Level of Care: Telemetry Medical [104]  I expect the patient will be discharged within 24 hours: No (not a candidate for 5C-Observation unit)  Covid Evaluation: N/A  Diagnosis: Acute respiratory failure with hypoxia [facility]) [[id number]]  Admitting Physician: [name] [phone]  Attending Physician: [name] [email or web address]  PT Class (Do Not Modify): Observation [104]  PT Acc Code (Do Not Modify): Observation [10022]       B Medical/Surgery History Past Medical History:  Diagnosis Date  . Cancer ([place])    skin (left hand)  . Emphysema of lung ([place])    History reviewed. No pertinent surgical history.   A IV Location/Drains/Wounds Patient Lines/Drains/Airways Status   Active Line/Drains/Airways    Name:   Placement date:   Placement time:   Site:   Days:   Peripheral IV 07/28/18 Left Antecubital   07/28/18    1953    Antecubital   1          Intake/Output Last 24 hours No intake or output data in the 24 hours ending 07/29/18 0047  Labs/Imaging Results for orders placed or performed during the hospital encounter of 07/28/18 (from the past 48 hour(s))   CBC with Differential     Status: Abnormal   Collection Time: 07/28/18  8:12 PM  Result Value Ref Range   WBC 4.5 4.0 - 10.5 K/uL   RBC 3.04 (L) 4.22 - 5.81 MIL/uL   Hemoglobin 11.2 (L) 13.0 - 17.0 g/dL   HCT 33.6 (L) 39.0 - 52.0 %   MCV 110.5 (H) 80.0 - 100.0 fL   MCH 36.8 (H) 26.0 - 34.0 pg   MCHC 33.3 30.0 - 36.0 g/dL   RDW 12.7 11.5 - 15.5 %   Platelets 47 (L) 150 - 400 K/uL    Comment: REPEATED TO VERIFY PLATELET COUNT CONFIRMED BY SMEAR SPECIMEN CHECKED FOR CLOTS Immature Platelet Fraction may be clinically indicated, consider ordering this additional test [id number]    nRBC 0.0 0.0 - 0.2 %   Neutrophils Relative % 32 %   Neutro Abs 1.5 (L) 1.7 - 7.7 K/uL   Lymphocytes Relative 54 %   Lymphs Abs 2.4 0.7 - 4.0 K/uL   Monocytes Relative 10 %   Monocytes Absolute 0.5 0.1 - 1.0 K/uL   Eosinophils Relative 3 %   Eosinophils Absolute 0.2 0.0 - 0.5 K/uL   Basophils Relative 1 %   Basophils Absolute 0.1 0.0 - 0.1 K/uL   RBC Morphology MORPHOLOGY UNREMARKABLE    Immature Granulocytes 0 %   Abs Immature Granulocytes 0.02 0.00 - 0.07 K/uL    Comment: Performed at [facility], [place] [address] [place], [place] [address]  Comprehensive metabolic panel     Status: Abnormal   Collection Time: 07/28/18  8:12 PM  Result Value Ref Range   Sodium 144 135 - 145 mmol/L   Potassium 3.9 3.5 - 5.1 mmol/L   Chloride 108 98 - 111 mmol/L   CO2 20 (L) 22 - 32 mmol/L   Glucose, Bld 98 70 - 99 mg/dL   BUN 11 6 - 20 mg/dL   Creatinine, Ser 0.59 (L) 0.61 - 1.24 mg/dL   Calcium 8.8 (L) 8.9 - 10.3 mg/dL   Total Protein 7.3 6.5 - 8.1 g/dL   Albumin 3.6 3.5 - 5.0 g/dL   AST 197 (H) 15 - 41 U/L   ALT 74 (H) 0 - 44 U/L   Alkaline Phosphatase 107 38 - 126 U/L   Total Bilirubin 0.7 0.3 - 1.2 mg/dL   GFR calc non Af Amer >60 >60 mL/min   GFR calc Af Amer >60 >60 mL/min   Anion gap 16 (H) 5 - 15    Comment: Performed at [facility] [address], [place], [place] [address]  Lactic  acid, plasma     Status: None   Collection Time: 07/28/18  8:12 PM  Result Value Ref Range   Lactic Acid, Venous 1.9 0.5 - 1.9 mmol/L    Comment: Performed at [facility] [address], [place], [place] [address]  Troponin I - ONCE - STAT     Status: None   Collection Time: 07/28/18  8:12 PM  Result Value Ref Range   Troponin I <0.03 <0.03 ng/mL    Comment: Performed at [facility] [address], [place], [place] [address]  Brain natriuretic peptide     Status: Abnormal   Collection Time: 07/28/18  8:12 PM  Result Value Ref Range   B Natriuretic Peptide 412.6 (H) 0.0 - 100.0 pg/mL    Comment: Performed at [facility] [address], [place], [place] [address]  D-dimer, quantitative (not at [facility])     Status: Abnormal   Collection Time: 07/28/18  8:12 PM  Result Value Ref Range   D-Dimer, Quant 1.15 (H) 0.00 - 0.50 ug/mL-FEU    Comment: (NOTE) At the manufacturer cut-off of 0.50 ug/mL FEU, this assay has been documented to exclude PE with a sensitivity and negative predictive value of 97 to 99%.  At this time, this assay has not been approved by the FDA to exclude DVT/VTE. Results should be correlated with clinical presentation. Performed at [facility], [place] [address], [place], [place] [address]   Lactic acid, plasma     Status: Abnormal   Collection Time: 07/28/18  8:35 PM  Result Value Ref Range   Lactic Acid, Venous 2.0 (HH) 0.5 - 1.9 mmol/L    Comment: CRITICAL RESULT CALLED TO, READ BACK BY AND VERIFIED WITH: [facility] 07/28/18 2105 WAYK Performed at [facility], [place] [address] [place],  [address]   SARS Coronavirus 2 [facility] order, Performed in [facility] hospital lab)     Status: None   Collection Time: 07/28/18  8:36 PM  Result Value Ref Range   SARS Coronavirus 2 NEGATIVE NEGATIVE    Comment: (NOTE) If result is NEGATIVE SARS-CoV-2 target nucleic acids are NOT DETECTED. The SARS-CoV-2 RNA is generally  detectable in upper and lower  respiratory specimens during the acute phase of infection. The lowest  concentration of SARS-CoV-2 viral copies this assay can detect is 250  copies / mL. A negative result does not preclude SARS-CoV-2 infection  and should not be used as the sole basis for treatment or other  patient management decisions.  A negative result may occur with  improper specimen collection / handling, submission of specimen other  than nasopharyngeal swab, presence of viral mutation(s) within the  areas targeted by this assay, and inadequate number of viral copies  (<250 copies / mL). A negative result must be combined with clinical  observations, patient history, and epidemiological information. If result is POSITIVE SARS-CoV-2 target nucleic acids are DETECTED. The SARS-CoV-2 RNA is generally detectable in upper and lower  respiratory specimens dur ing the acute phase of infection.  Positive  results are indicative of active infection with SARS-CoV-2.  Clinical  correlation with patient history and other diagnostic information is  necessary to determine patient infection status.  Positive results do  not rule out bacterial infection or co-infection with other viruses. If result is PRESUMPTIVE POSTIVE SARS-CoV-2 nucleic acids MAY BE PRESENT.   A presumptive positive result was obtained on the submitted specimen  and confirmed on repeat testing.  While 2019 novel coronavirus  (SARS-CoV-2) nucleic acids may be present in the submitted sample  additional confirmatory testing may be necessary for epidemiological  and / or clinical management purposes  to differentiate between  SARS-CoV-2 and other Sarbecovirus currently known to infect humans.  If clinically indicated additional testing with an alternate test  methodology [phone]) is advised. The SARS-CoV-2 RNA is generally  detectable in upper and lower respiratory sp ecimens during the acute  phase of infection. The  expected result is Negative. Fact Sheet for Patients:  StrictlyIdeas.no Fact Sheet for Healthcare Providers: BankingDealers.co.za This test is not yet approved or cleared by the [place] FDA and has been authorized for detection and/or diagnosis of SARS-CoV-2 by FDA under an Emergency Use Authorization (EUA).  This EUA will remain in effect (meaning this test can be used) for the duration of the COVID-19 declaration under Section 564(b)(1) of the Act, 21 U.S.C. section 360bbb-3(b)(1), unless the authorization is terminated or revoked sooner. Performed at [facility], [place] [address], [place], [place] [address]   Lactic acid, plasma     Status: Abnormal   Collection Time: 07/28/18 11:01 PM  Result Value Ref Range   Lactic Acid, Venous 2.1 (HH) 0.5 - 1.9 mmol/L    Comment: CRITICAL RESULT CALLED TO, READ BACK BY AND VERIFIED WITH: [place] K,RN 07/29/18 0001 WAYK Performed at [facility] [address], [place], [place] [address]    Ct Angio Chest Pe W And/or Wo Contrast  Result Date: 07/28/2018 CLINICAL DATA:  Shortness of breath, evaluate for PE EXAM: CT ANGIOGRAPHY CHEST WITH CONTRAST TECHNIQUE: Multidetector CT imaging of the chest was performed using the standard protocol during bolus administration of intravenous contrast. Multiplanar CT image reconstructions and MIPs were obtained to evaluate the vascular anatomy. CONTRAST:  112mL OMNIPAQUE IOHEXOL 350 MG/ML SOLN COMPARISON:  12/24/2014 FINDINGS: Cardiovascular: Satisfactory opacification of the pulmonary arteries to the segmental level. No evidence of pulmonary embolism. Scattered coronary artery calcifications. Normal heart size. No pericardial effusion. Mediastinum/Nodes: No enlarged mediastinal, hilar, or axillary lymph nodes. Thyroid gland, trachea, and esophagus demonstrate no significant findings. Lungs/Pleura: Moderate centrilobular emphysema. There is clustered  centrilobular and ground-glass opacity of the right upper lobe (series 7, image 76) and of the superior segment left lower lobe (series  7, image 82). Diffuse bilateral bronchial wall thickening. No pleural effusion or pneumothorax. Upper Abdomen: No acute abnormality. Musculoskeletal: No chest wall abnormality. No acute or significant osseous findings. Review of the MIP images confirms the above findings. IMPRESSION: 1.  Negative examination for pulmonary embolism. 2. There is clustered centrilobular and ground-glass opacity of the right upper lobe (series 7, image 76) and of the superior segment left lower lobe (series 7, image 82), consistent with multifocal infection. 3. Diffuse bilateral nonspecific infectious or inflammatory bronchial wall thickening. 4.  Emphysema. 5.  Coronary artery disease. Electronically Signed   By: Eddie Candle M.D.   On: 07/28/2018 21:58   Dg Chest Portable 1 View  Result Date: 07/28/2018 CLINICAL DATA:  Shortness of breath and cough EXAM: PORTABLE CHEST 1 VIEW COMPARISON:  06/01/2018 FINDINGS: Cardiac shadow is stable. Aortic calcifications are again seen. The lungs are well aerated bilaterally. No acute infiltrate is seen. Old rib fractures are noted on the left. IMPRESSION: No active disease. Electronically Signed   By: Inez Catalina M.D.   On: 07/28/2018 20:34    Pending Labs Unresulted Labs (From admission, onward)    Start     Ordered   07/28/18 2203  Blood culture (routine x 2)  BLOOD CULTURE X 2,   STAT     07/28/18 2203   07/28/18 2045  HIV Antibody (routine testing w rflx)  Once,   R     07/28/18 2045          Vitals/Pain Today's Vitals   07/28/18 2259 07/28/18 2300 07/29/18 0000 07/29/18 0017  BP:  125/88 102/74   Pulse:  87 90   Resp:  16 14   Temp:      TempSrc:      SpO2:  99% 100%   PainSc: 5    5     Isolation Precautions Droplet and Contact precautions  Medications Medications  iohexol (OMNIPAQUE) 350 MG/ML injection 100 mL (100 mLs  Intravenous Contrast Given 07/28/18 2152)  cefTRIAXone (ROCEPHIN) 1 g in sodium chloride 0.9 % 100 mL IVPB (0 g Intravenous Stopped 07/28/18 2326)  azithromycin (ZITHROMAX) 500 mg in sodium chloride 0.9 % 250 mL IVPB (0 mg Intravenous Stopped 07/28/18 2354)    Mobility walks Low fall risk   Focused Assessments Pulmonary Assessment Handoff:  Lung sounds: Bilateral Breath Sounds: Expiratory wheezes O2 Device: Nasal Cannula O2 Flow Rate (L/min): 2 L/min      R Recommendations: See Admitting Provider Note  Report given to:   Additional Notes: Pt alert and oriented, breathing much better, on 2 L [place]

## 2018-07-29 NOTE — Progress Notes (Signed)
**Note De-Identified vi Obfusction** PROGRESS NOTE    Trevor Williams  GYB:[id number] DOB: Dec 10, 1960 DOA: 07/28/2018 PCP: Mrliss Coots, NP  Brief Nrrtive:  Trevor Williams is  58 y.o. mle with history of tobcco buse nd drinks lcohol [place] becuse of persistent shortness of breth ongoing for lst 1 month.  Ptient sttes he is getting short of breth on exertion which is progressively worsened.  At times he lso gets some centrl chest pin which lst for few minutes.  EMS ws clled nd ptient ws plced on nonrebrether given epinephrine Solu-Medrol mgnesium nd lbuterol nd ws brought to the ER.  Assessment & Pln:   Principl Problem:   Acute respirtory filure with hypoxi (HCC) Active Problems:   Community cquired pneumoni   1. Acute respirtory filure with hypoxi secondry to pneumoni nd possible COPD 1. COVID 19 negtive in ED - he is homeless, uses public trnsporttion, but otherwise, no known risk fctors.  2nd COVID test pending.  Consider keeping on precutions given ground glss opcities on imging. 2. Negtive RVP 3. Sputum cx pending.  Urine strep/legionell. 4. Blood cx pending. 5. Albuterol inhlers prn nd scheduled - consider dding steroids if SOB worsens, hold for now, pt on RA 6. Ceftrixone/zithromycin 7. Elevted BNP, stble, pt ppers euvolemic.  Hold off on echo nd diuresis for now.   2. Chest pin -ptient hs been hving chest pin off nd on.  Most of the symptoms re exertionl.   1. Troponin negtive x3.  EKG with rtificl, T wve inversions in V4, V5.  Appers similr to priors. 2. CT scn of the chest lso shows possible coronry disese.   3. Consider echo   3. Thrombocytopeni likely from lcoholism.  Follow CBC. 4. Leukopeni: negtive HIV.  Follow diff. Mild neutropeni tody.  5. Microcytic nemi check nemi pnel. 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  Hypoprolif retic. 6. Tobcco buse -tobcco cesstion  counseling requested. 7. Alcohol buse -on CIWA protocol.  Hving some mild withdrwl symptoms now.  Drinks 1/2  fifth  dy. 8. Elevted liver enzymes: 2/2 etoh, continue to monitor   DVT prophylxis: SCD Code Sttus: full  Fmily Communiction: none t bedside Disposition Pln: pending   Consultnts:   none  Procedures:   none  Antimicrobils:  Anti-infectives (From dmission, onwrd)   Strt     Dose/Rte Route Frequency Ordered Stop   07/29/18 2000  cefTRIAXone (ROCEPHIN) 1 g in sodium chloride 0.9 % 100 mL IVPB     1 g 200 mL/hr over 30 Minutes Intrvenous Every 24 hours 07/29/18 0114 08/05/18 1959   07/29/18 2000  zithromycin (ZITHROMAX) 500 mg in sodium chloride 0.9 % 250 mL IVPB     500 mg 250 mL/hr over 60 Minutes Intrvenous Every 24 hours 07/29/18 0114 08/05/18 1959   07/28/18 2215  cefTRIAXone (ROCEPHIN) 1 g in sodium chloride 0.9 % 100 mL IVPB     1 g 200 mL/hr over 30 Minutes Intrvenous  Once 07/28/18 2203 07/28/18 2326   07/28/18 2215  zithromycin (ZITHROMAX) 500 mg in sodium chloride 0.9 % 250 mL IVPB     500 mg 250 mL/hr over 60 Minutes Intrvenous  Once 07/28/18 2203 07/28/18 2354     Subjective: Asking for inhler. Drinks 1/2  fifth  dy.  Objective: Vitls:   07/29/18 0130 07/29/18 0200 07/29/18 0313 07/29/18 0800  BP: (!) 142/89 115/84 135/88 127/84  Pulse: 90 89 97 89  Resp: 17  20  Temp:   97.8 F (36.6 C) 98.7 F (37.1 C)  TempSrc:   Oral Oral  SpO2: 97%  99% 100%  Weight:   58.1 kg   Height:   5\' 7"  (1.702 m)     Intake/Output Summary (Last 24 hours) at 07/29/2018 1541 Last data filed at 07/29/2018 0800 Gross per 24 hour  Intake 120 ml  Output 50 ml  Net 70 ml   Filed Weights   07/29/18 0313  Weight: 58.1 kg    Examination:  General exam: ppears calm and comfortable  Respiratory system: unlabored Gastrointestinal system: bdomen is nondistended, soft and nontender Central nervous system: lert and oriented. No  focal neurological deficits. Extremities: no lee Skin: No rashes, lesions or ulcers Psychiatry: Judgement and insight appear normal. Mood & affect appropriate.     Data Reviewed: I have personally reviewed following labs and imaging studies  CBC: Recent Labs  Lab 07/28/18 2012 07/29/18 0127  WBC 4.5 2.0*  NEUTROBS 1.5* 1.4*  HGB 11.2* 11.2*  HCT 33.6* 33.6*  MCV 110.5* 110.2*  PLT 47* 46*   Basic Metabolic Panel: Recent Labs  Lab 07/28/18 2012 07/29/18 0127  N 144 142  K 3.9 4.0  CL 108 103  CO2 20* 22  GLUCOSE 98 126*  BUN 11 8  CRETININE 0.59* 0.72  CLCIUM 8.8* 8.2*   GFR: Estimated Creatinine Clearance: 83.7 mL/min (by C-G formula based on SCr of 0.72 mg/dL). Liver Function Tests: Recent Labs  Lab 07/28/18 2012 07/29/18 0127  ST 197* 175*  LT 74* 75*  LKPHOS 107 109  BILITOT 0.7 0.7  PROT 7.3 7.6  LBUMIN 3.6 3.6   No results for input(s): LIPSE, MYLSE in the last 168 hours. No results for input(s): MMONI in the last 168 hours. Coagulation Profile: No results for input(s): INR, PROTIME in the last 168 hours. Cardiac Enzymes: Recent Labs  Lab 07/28/18 2012 07/29/18 0127 07/29/18 0748  TROPONINI <0.03 <0.03 <0.03   BNP (last 3 results) No results for input(s): PROBNP in the last 8760 hours. Hb1C: No results for input(s): HGB1C in the last 72 hours. CBG: No results for input(s): GLUCP in the last 168 hours. Lipid Profile: No results for input(s): CHOL, HDL, LDLCLC, TRIG, CHOLHDL, LDLDIRECT in the last 72 hours. Thyroid Function Tests: No results for input(s): TSH, T4TOTL, FREET4, T3FREE, THYROIDB in the last 72 hours. nemia Panel: Recent Labs    07/29/18 0748  VITMINB12 394  FOLTE 5.2*  FERRITIN 1,371*  TIBC 270  IRON 134  RETICCTPCT 0.9   Sepsis Labs: Recent Labs  Lab 07/28/18 2012 07/28/18 2035 07/28/18 2301  LTICCIDVEN 1.9 2.0* 2.1*    Recent Results (from the past 240 hour(s))  SRS Coronavirus 2  [facility] order, Performed in [place] hospital lab)     Status: None   Collection Time: 07/28/18  8:36 PM  Result Value Ref Range Status   SRS Coronavirus 2 NEGTIVE NEGTIVE Final    Comment: (NOTE) If result is NEGTIVE SRS-CoV-2 target nucleic acids are NOT DETECTED. The SRS-CoV-2 RN is generally detectable in upper and lower  respiratory specimens during the acute phase of infection. The lowest  concentration of SRS-CoV-2 viral copies this assay can detect is 250  copies / mL.  negative result does not preclude SRS-CoV-2 infection  and should not be used as the sole basis for treatment or other  patient management decisions.   negative result may occur with  improper specimen collection / handling, submission of specimen  other  than nasopharyngeal swab, presence of viral mutation(s) within the  areas targeted by this assay, and inadequate number of viral copies  (<250 copies / mL).  negative result must be combined with clinical  observations, patient history, and epidemiological information. If result is POSITIVE SRS-CoV-2 target nucleic acids are DETECTED. The SRS-CoV-2 RN is generally detectable in upper and lower  respiratory specimens dur ing the acute phase of infection.  Positive  results are indicative of active infection with SRS-CoV-2.  Clinical  correlation with patient history and other diagnostic information is  necessary to determine patient infection status.  Positive results do  not rule out bacterial infection or co-infection with other viruses. If result is PRESUMPTIVE POSTIVE SRS-CoV-2 nucleic acids MY BE PRESENT.    presumptive positive result was obtained on the submitted specimen  and confirmed on repeat testing.  While 2019 novel coronavirus  (SRS-CoV-2) nucleic acids may be present in the submitted sample  additional confirmatory testing may be necessary for epidemiological  and / or clinical management purposes  to differentiate  between  SRS-CoV-2 and other Sarbecovirus currently known to infect humans.  If clinically indicated additional testing with an alternate test  methodology [phone]) is advised. The SRS-CoV-2 RN is generally  detectable in upper and lower respiratory sp ecimens during the acute  phase of infection. The expected result is Negative. Fact Sheet for Patients:  StrictlyIdeas.no Fact Sheet for Healthcare Providers: BankingDealers.co.za This test is not yet approved or cleared by the [place] [employer] and has been authorized for detection and/or diagnosis of SRS-CoV-2 by FD under an Emergency Use uthorization (EU).  This EU will remain in effect (meaning this test can be used) for the duration of the COVID-19 declaration under Section 564(b)(1) of the ct, 21 U.S.C. section 360bbb-3(b)(1), unless the authorization is terminated or revoked sooner. Performed at [facility], [place] [address], [place], [place] [address]   Respiratory Panel by PCR     Status: None   Collection Time: 07/28/18  8:36 PM  Result Value Ref Range Status   denovirus NOT DETECTED NOT DETECTED Final   Coronavirus 229E NOT DETECTED NOT DETECTED Final    Comment: (NOTE) The Coronavirus on the Respiratory Panel, DOES NOT test for the novel  Coronavirus (2019 nCoV)    Coronavirus HKU1 NOT DETECTED NOT DETECTED Final   Coronavirus NL63 NOT DETECTED NOT DETECTED Final   Coronavirus OC43 NOT DETECTED NOT DETECTED Final   Metapneumovirus NOT DETECTED NOT DETECTED Final   Rhinovirus / Enterovirus NOT DETECTED NOT DETECTED Final   Influenza  NOT DETECTED NOT DETECTED Final   Influenza B NOT DETECTED NOT DETECTED Final   Parainfluenza Virus 1 NOT DETECTED NOT DETECTED Final   Parainfluenza Virus 2 NOT DETECTED NOT DETECTED Final   Parainfluenza Virus 3 NOT DETECTED NOT DETECTED Final   Parainfluenza Virus 4 NOT DETECTED NOT DETECTED Final   Respiratory Syncytial  Virus NOT DETECTED NOT DETECTED Final   Bordetella pertussis NOT DETECTED NOT DETECTED Final   Chlamydophila pneumoniae NOT DETECTED NOT DETECTED Final   Mycoplasma pneumoniae NOT DETECTED NOT DETECTED Final    Comment: Performed at [facility], [place]. [address] [place], [place] [address]  Blood culture (routine x 2)     Status: None (Preliminary result)   Collection Time: 07/28/18 10:25 PM  Result Value Ref Range Status   Specimen Description BLOOD RIGHT UPPER RM  Final   Special Requests   Final    BOTTLES  DRAWN AEROBIC AND ANAEROBIC Blood Culture adequate volume   Culture   Final    NO GROWTH < 12 HOURS Performed at [place] [address] [place], [place] [address]    Report Status PENDING  Incomplete  Blood culture (routine x 2)     Status: None (Preliminary result)   Collection Time: 07/28/18 10:36 PM  Result Value Ref Range Status   Specimen Description BLOOD RIGHT ARM  Final   Special Requests   Final    BOTTLES DRAWN AEROBIC AND ANAEROBIC Blood Culture adequate volume   Culture   Final    NO GROWTH < 12 HOURS Performed at [facility], [place] [address] [place], [place] [address]    Report Status PENDING  Incomplete         Radiology Studies: Ct Angio Chest Pe W And/or Wo Contrast  Result Date: 07/28/2018 CLINICAL DATA:  Shortness of breath, evaluate for PE EXAM: CT ANGIOGRAPHY CHEST WITH CONTRAST TECHNIQUE: Multidetector CT imaging of the chest was performed using the standard protocol during bolus administration of intravenous contrast. Multiplanar CT image reconstructions and MIPs were obtained to evaluate the vascular anatomy. CONTRAST:  131mL OMNIPAQUE IOHEXOL 350 MG/ML SOLN COMPARISON:  12/24/2014 FINDINGS: Cardiovascular: Satisfactory opacification of the pulmonary arteries to the segmental level. No evidence of pulmonary embolism. Scattered coronary artery calcifications. Normal heart size. No pericardial effusion. Mediastinum/Nodes: No  enlarged mediastinal, hilar, or axillary lymph nodes. Thyroid gland, trachea, and esophagus demonstrate no significant findings. Lungs/Pleura: Moderate centrilobular emphysema. There is clustered centrilobular and ground-glass opacity of the right upper lobe (series 7, image 76) and of the superior segment left lower lobe (series 7, image 82). Diffuse bilateral bronchial wall thickening. No pleural effusion or pneumothorax. Upper Abdomen: No acute abnormality. Musculoskeletal: No chest wall abnormality. No acute or significant osseous findings. Review of the MIP images confirms the above findings. IMPRESSION: 1.  Negative examination for pulmonary embolism. 2. There is clustered centrilobular and ground-glass opacity of the right upper lobe (series 7, image 76) and of the superior segment left lower lobe (series 7, image 82), consistent with multifocal infection. 3. Diffuse bilateral nonspecific infectious or inflammatory bronchial wall thickening. 4.  Emphysema. 5.  Coronary artery disease. Electronically Signed   By: Eddie Candle M.D.   On: 07/28/2018 21:58   Dg Chest Portable 1 View  Result Date: 07/28/2018 CLINICAL DATA:  Shortness of breath and cough EXAM: PORTABLE CHEST 1 VIEW COMPARISON:  06/01/2018 FINDINGS: Cardiac shadow is stable. Aortic calcifications are again seen. The lungs are well aerated bilaterally. No acute infiltrate is seen. Old rib fractures are noted on the left. IMPRESSION: No active disease. Electronically Signed   By: Inez Catalina M.D.   On: 07/28/2018 20:34        Scheduled Meds: . albuterol  2 puff Inhalation TID  . folic acid  1 mg Oral Daily  . LORazepam  0-4 mg Intravenous Q6H   Followed by  . [START ON 07/31/2018] LORazepam  0-4 mg Intravenous Q12H  . multivitamin with minerals  1 tablet Oral Daily  . thiamine  100 mg Oral Daily   Or  . thiamine  100 mg Intravenous Daily   Continuous Infusions: . azithromycin    . cefTRIAXone (ROCEPHIN)  IV       LOS: 0 days     Time spent: over 30 min    Fayrene Helper, MD [facility] Pager AMION  If 7PM-7AM, please contact night-coverage [email or web address] Password  [email or web address] 07/29/2018, 3:41 PM

## 2018-07-29 NOTE — ED Notes (Signed)
Floor Charge RN called, requested 15 minutes so that room can be changed.  Will call back in 15.

## 2018-07-29 NOTE — Progress Notes (Signed)
Called Dr. Florene Glen to patients bedside due to pt is considering leaving AMA. Pt decided to stay the night and possibly be D/C in the a.m.

## 2018-07-29 NOTE — ED Notes (Signed)
2W RN had questions about sending out COVID test.  RN paged Admitting provider who is now speaking to pt on phone.

## 2018-07-29 NOTE — ED Notes (Signed)
Dr. Hal Hope informed RN that he is going to do a "send out COVID test" for pt.  RN informed floor

## 2018-07-30 ENCOUNTER — Inpatient Hospital Stay (HOSPITAL_COMMUNITY): Payer: Medicaid Other

## 2018-07-30 DIAGNOSIS — J9601 Acute respiratory failure with hypoxia: Secondary | ICD-10-CM

## 2018-07-30 LAB — BLOOD GAS, ARTERIAL
Acid-base deficit: 2.2 mmol/L — ABNORMAL HIGH (ref 0.0–2.0)
Bicarbonate: 23.2 mmol/L (ref 20.0–28.0)
Drawn by: 246101
FIO2: 100
O2 Content: 15 L/min
O2 Saturation: 73.7 %
Patient temperature: 97.8
pCO2 arterial: 46.6 mmHg (ref 32.0–48.0)
pH, Arterial: 7.314 — ABNORMAL LOW (ref 7.350–7.450)
pO2, Arterial: 47.2 mmHg — ABNORMAL LOW (ref 83.0–108.0)

## 2018-07-30 LAB — COMPREHENSIVE METABOLIC PANEL
ALT: 68 U/L — ABNORMAL HIGH (ref 0–44)
AST: 152 U/L — ABNORMAL HIGH (ref 15–41)
Albumin: 3.5 g/dL (ref 3.5–5.0)
Alkaline Phosphatase: 96 U/L (ref 38–126)
Anion gap: 11 (ref 5–15)
BUN: 13 mg/dL (ref 6–20)
CO2: 21 mmol/L — ABNORMAL LOW (ref 22–32)
Calcium: 8.3 mg/dL — ABNORMAL LOW (ref 8.9–10.3)
Chloride: 104 mmol/L (ref 98–111)
Creatinine, Ser: 0.87 mg/dL (ref 0.61–1.24)
GFR calc Af Amer: >60 mL/min (ref >60)
GFR calc non Af Amer: >60 mL/min (ref >60)
Glucose, Bld: 210 mg/dL — ABNORMAL HIGH (ref 70–99)
Potassium: 3.5 mmol/L (ref 3.5–5.1)
Sodium: 136 mmol/L (ref 135–145)
Total Bilirubin: 1.1 mg/dL (ref 0.3–1.2)
Total Protein: 7 g/dL (ref 6.5–8.1)

## 2018-07-30 LAB — POCT I-STAT 7, (LYTES, BLD GAS, ICA,H+H)
Acid-base deficit: 4 mmol/L — ABNORMAL HIGH (ref 0.0–2.0)
Bicarbonate: 21.5 mmol/L (ref 20.0–28.0)
Calcium, Ion: 1.16 mmol/L (ref 1.15–1.40)
HCT: 39 % (ref 39.0–52.0)
Hemoglobin: 13.3 g/dL (ref 13.0–17.0)
O2 Saturation: 95 %
Patient temperature: 98.6
Potassium: 4 mmol/L (ref 3.5–5.1)
Sodium: 139 mmol/L (ref 135–145)
TCO2: 23 mmol/L (ref 22–32)
pCO2 arterial: 42.1 mmHg (ref 32.0–48.0)
pH, Arterial: 7.317 — ABNORMAL LOW (ref 7.350–7.450)
pO2, Arterial: 84 mmHg (ref 83.0–108.0)

## 2018-07-30 LAB — MAGNESIUM
Magnesium: 1.4 mg/dL — ABNORMAL LOW (ref 1.7–2.4)
Magnesium: 1.5 mg/dL — ABNORMAL LOW (ref 1.7–2.4)
Magnesium: 2.1 mg/dL (ref 1.7–2.4)

## 2018-07-30 LAB — CBC
HCT: 31.8 % — ABNORMAL LOW (ref 39.0–52.0)
Hemoglobin: 10.6 g/dL — ABNORMAL LOW (ref 13.0–17.0)
MCH: 37.1 pg — ABNORMAL HIGH (ref 26.0–34.0)
MCHC: 33.3 g/dL (ref 30.0–36.0)
MCV: 111.2 fL — ABNORMAL HIGH (ref 80.0–100.0)
Platelets: 42 10*3/uL — ABNORMAL LOW (ref 150–400)
RBC: 2.86 MIL/uL — ABNORMAL LOW (ref 4.22–5.81)
RDW: 12.7 % (ref 11.5–15.5)
WBC: 10.7 10*3/uL — ABNORMAL HIGH (ref 4.0–10.5)
nRBC: 0 % (ref 0.0–0.2)

## 2018-07-30 LAB — MRSA PCR SCREENING: MRSA by PCR: NEGATIVE

## 2018-07-30 LAB — GLUCOSE, CAPILLARY
Glucose-Capillary: 193 mg/dL — ABNORMAL HIGH (ref 70–99)
Glucose-Capillary: 151 mg/dL — ABNORMAL HIGH (ref 70–99)
Glucose-Capillary: 125 mg/dL — ABNORMAL HIGH (ref 70–99)

## 2018-07-30 LAB — PHOSPHORUS
Phosphorus: 5 mg/dL — ABNORMAL HIGH (ref 2.5–4.6)
Phosphorus: 3.5 mg/dL (ref 2.5–4.6)

## 2018-07-30 LAB — NOVEL CORONAVIRUS, NAA (HOSP ORDER, SEND-OUT TO REF LAB; TAT 18-24 HRS): SARS-CoV-2, NAA: NOT DETECTED

## 2018-07-30 MED ORDER — SODIUM CHLORIDE 0.9 % IV BOLUS
500.0000 mL | Freq: Once | INTRAVENOUS | Status: AC
  Administered 2018-07-30: 500 mL via INTRAVENOUS

## 2018-07-30 MED ORDER — OSMOLITE 1.2 CAL PO LIQD
1000.0000 mL | ORAL | Status: DC
  Administered 2018-07-30 – 2018-08-03 (×5): 1000 mL
  Filled 2018-07-30 (×9): qty 1000

## 2018-07-30 MED ORDER — ENOXAPARIN SODIUM 40 MG/0.4ML ~~LOC~~ SOLN: 40.0000 mg | SUBCUTANEOUS | Status: DC

## 2018-07-30 MED ORDER — FENTANYL 2500MCG IN NS 250ML (10MCG/ML) PREMIX INFUSION
50.0000 ug/h | INTRAVENOUS | Status: DC
  Administered 2018-07-30: 50 ug/h via INTRAVENOUS
  Administered 2018-07-31: 175 ug/h via INTRAVENOUS
  Administered 2018-07-31: 150 ug/h via INTRAVENOUS
  Administered 2018-08-01: 50 ug/h via INTRAVENOUS
  Administered 2018-08-02 (×2): 200 ug/h via INTRAVENOUS
  Administered 2018-08-03: 125 ug/h via INTRAVENOUS
  Filled 2018-07-30 (×7): qty 250

## 2018-07-30 MED ORDER — METOPROLOL TARTRATE 5 MG/5ML IV SOLN
2.5000 mg | Freq: Once | INTRAVENOUS | Status: AC
  Administered 2018-07-30: 2.5 mg via INTRAVENOUS
  Filled 2018-07-30: qty 5

## 2018-07-30 MED ORDER — VANCOMYCIN HCL 10 G IV SOLR
1250.0000 mg | Freq: Once | INTRAVENOUS | Status: AC
  Administered 2018-07-30: 1250 mg via INTRAVENOUS
  Filled 2018-07-30 (×2): qty 1250

## 2018-07-30 MED ORDER — LORAZEPAM 2 MG/ML IJ SOLN
2.0000 mg | Freq: Once | INTRAMUSCULAR | Status: AC
  Administered 2018-07-30: 2 mg via INTRAVENOUS
  Filled 2018-07-30: qty 1

## 2018-07-30 MED ORDER — FENTANYL CITRATE (PF) 100 MCG/2ML IJ SOLN
INTRAMUSCULAR | Status: AC
  Administered 2018-07-30: 09:00:00 100 ug
  Filled 2018-07-30: qty 2

## 2018-07-30 MED ORDER — FENTANYL BOLUS VIA INFUSION
50.0000 ug | INTRAVENOUS | Status: DC | PRN
  Administered 2018-07-30 – 2018-08-03 (×26): 50 ug via INTRAVENOUS
  Filled 2018-07-30: qty 50

## 2018-07-30 MED ORDER — LACTATED RINGERS IV SOLN
INTRAVENOUS | Status: DC
  Administered 2018-07-30 – 2018-08-02 (×5): via INTRAVENOUS

## 2018-07-30 MED ORDER — SUCCINYLCHOLINE CHLORIDE 200 MG/10ML IV SOSY
PREFILLED_SYRINGE | INTRAVENOUS | Status: AC
  Filled 2018-07-30: qty 10

## 2018-07-30 MED ORDER — ORAL CARE MOUTH RINSE
15.0000 mL | OROMUCOSAL | Status: DC
  Administered 2018-07-30 – 2018-08-03 (×40): 15 mL via OROMUCOSAL

## 2018-07-30 MED ORDER — VECURONIUM BROMIDE 10 MG IV SOLR
INTRAVENOUS | Status: AC
  Administered 2018-07-30: 10 mg
  Filled 2018-07-30: qty 10

## 2018-07-30 MED ORDER — STERILE WATER FOR INJECTION IJ SOLN
INTRAMUSCULAR | Status: AC
  Filled 2018-07-30: qty 10

## 2018-07-30 MED ORDER — IPRATROPIUM-ALBUTEROL 0.5-2.5 (3) MG/3ML IN SOLN
3.0000 mL | Freq: Four times a day (QID) | RESPIRATORY_TRACT | Status: DC
  Administered 2018-07-30 – 2018-08-05 (×25): 3 mL via RESPIRATORY_TRACT
  Filled 2018-07-30 (×24): qty 3

## 2018-07-30 MED ORDER — MIDAZOLAM HCL 2 MG/2ML IJ SOLN
2.0000 mg | INTRAMUSCULAR | Status: DC | PRN
  Administered 2018-07-30 – 2018-07-31 (×9): 2 mg via INTRAVENOUS
  Filled 2018-07-30 (×8): qty 2

## 2018-07-30 MED ORDER — LORAZEPAM 2 MG/ML IJ SOLN
1.0000 mg | Freq: Once | INTRAMUSCULAR | Status: AC
  Administered 2018-07-30: 1 mg via INTRAVENOUS
  Filled 2018-07-30: qty 1

## 2018-07-30 MED ORDER — MIDAZOLAM HCL 2 MG/2ML IJ SOLN
INTRAMUSCULAR | Status: AC
  Filled 2018-07-30: qty 4

## 2018-07-30 MED ORDER — PROPOFOL 10 MG/ML IV BOLUS
INTRAVENOUS | Status: AC
  Filled 2018-07-30: qty 20

## 2018-07-30 MED ORDER — SODIUM CHLORIDE 0.9 % IV BOLUS
250.0000 mL | Freq: Once | INTRAVENOUS | Status: AC
  Administered 2018-07-30: 250 mL via INTRAVENOUS

## 2018-07-30 MED ORDER — DEXMEDETOMIDINE HCL IN NACL 200 MCG/50ML IV SOLN
0.0000 ug/kg/h | INTRAVENOUS | Status: DC
  Administered 2018-07-30: 14:00:00 0.4 ug/kg/h via INTRAVENOUS
  Filled 2018-07-30 (×4): qty 50

## 2018-07-30 MED ORDER — BISACODYL 10 MG RE SUPP: 10.0000 mg | Freq: Every day | RECTAL | Status: DC | PRN

## 2018-07-30 MED ORDER — SODIUM CHLORIDE 0.9 % IV SOLN
2.0000 g | INTRAVENOUS | Status: DC
  Administered 2018-07-30 – 2018-08-01 (×3): 2 g via INTRAVENOUS
  Filled 2018-07-30 (×5): qty 20

## 2018-07-30 MED ORDER — NOREPINEPHRINE 4 MG/250ML-% IV SOLN
0.0000 ug/min | INTRAVENOUS | Status: DC
  Administered 2018-07-30: 19:00:00 2 ug/min via INTRAVENOUS
  Filled 2018-07-30: qty 250

## 2018-07-30 MED ORDER — ALBUTEROL SULFATE (2.5 MG/3ML) 0.083% IN NEBU
2.5000 mg | INHALATION_SOLUTION | RESPIRATORY_TRACT | Status: DC | PRN
  Administered 2018-07-30 (×2): 2.5 mg via RESPIRATORY_TRACT
  Filled 2018-07-30: qty 3

## 2018-07-30 MED ORDER — VANCOMYCIN HCL IN DEXTROSE 750-5 MG/150ML-% IV SOLN
750.0000 mg | Freq: Two times a day (BID) | INTRAVENOUS | Status: DC
  Administered 2018-07-31 – 2018-08-01 (×3): 750 mg via INTRAVENOUS
  Filled 2018-07-30 (×4): qty 150

## 2018-07-30 MED ORDER — VITAL HIGH PROTEIN PO LIQD: 1000.0000 mL | ORAL | Status: DC

## 2018-07-30 MED ORDER — PRO-STAT SUGAR FREE PO LIQD
30.0000 mL | Freq: Every day | ORAL | Status: DC
  Administered 2018-07-31 – 2018-08-02 (×3): 30 mL
  Filled 2018-07-30 (×3): qty 30

## 2018-07-30 MED ORDER — PRO-STAT SUGAR FREE PO LIQD: 30.0000 mL | Freq: Two times a day (BID) | ORAL | Status: DC

## 2018-07-30 MED ORDER — DOCUSATE SODIUM 50 MG/5ML PO LIQD: 100.0000 mg | Freq: Two times a day (BID) | ORAL | Status: DC | PRN

## 2018-07-30 MED ORDER — ETOMIDATE 2 MG/ML IV SOLN
INTRAVENOUS | Status: AC
  Administered 2018-07-30: 20 mg
  Filled 2018-07-30: qty 20

## 2018-07-30 MED ORDER — MAGNESIUM SULFATE 2 GM/50ML IV SOLN
2.0000 g | Freq: Once | INTRAVENOUS | Status: AC
  Administered 2018-07-30: 13:00:00 2 g via INTRAVENOUS
  Filled 2018-07-30 (×2): qty 50

## 2018-07-30 MED ORDER — SODIUM CHLORIDE 0.9 % IV SOLN
250.0000 mL | INTRAVENOUS | Status: DC
  Administered 2018-07-30 – 2018-08-10 (×2): 250 mL via INTRAVENOUS

## 2018-07-30 MED ORDER — LACTATED RINGERS IV BOLUS
1000.0000 mL | Freq: Once | INTRAVENOUS | Status: AC
  Administered 2018-07-30: 16:00:00 1000 mL via INTRAVENOUS

## 2018-07-30 MED ORDER — METOPROLOL TARTRATE 5 MG/5ML IV SOLN
5.0000 mg | Freq: Once | INTRAVENOUS | Status: AC
  Administered 2018-07-30: 5 mg via INTRAVENOUS
  Filled 2018-07-30: qty 5

## 2018-07-30 MED ORDER — LACTATED RINGERS IV BOLUS
1000.0000 mL | Freq: Once | INTRAVENOUS | Status: AC
  Administered 2018-07-30: 15:00:00 1000 mL via INTRAVENOUS

## 2018-07-30 MED ORDER — PANTOPRAZOLE SODIUM 40 MG PO PACK
40.0000 mg | PACK | ORAL | Status: DC
  Administered 2018-07-30 – 2018-08-01 (×3): 40 mg
  Filled 2018-07-30 (×5): qty 20

## 2018-07-30 MED ORDER — CHLORHEXIDINE GLUCONATE 0.12% ORAL RINSE (MEDLINE KIT)
15.0000 mL | Freq: Two times a day (BID) | OROMUCOSAL | Status: DC
  Administered 2018-07-30 – 2018-08-03 (×9): 15 mL via OROMUCOSAL

## 2018-07-30 NOTE — Progress Notes (Signed)
Pharmacy Antibiotic Note  Trevor Williams is a 58 y.o. male admitted on 07/28/2018 with pneumonia.  Pharmacy has been consulted for vancomycin dosing. Currently on azithromycin/ceftriaxone per MD. Transferred to the ICU 5/12 with respiratory distress to be intubated. SCr up a bit to 0.87.  Plan: Increase ceftriaxone to 2g IV q24h Azithro 500mg  IV q24h per MD Vancomycin 1250mg  IV x 1; then Vancomycin 750 mg IV Q 12 hrs. Goal AUC 400-550. Expected AUC: 536 SCr used: 0.87 Monitor clinical progress, c/s, renal function F/u de-escalation plan/LOT, vancomycin trough as indicated   Height: 5\' 7"  (170.2 cm) Weight: 126 lb 12.2 oz (57.5 kg) IBW/kg (Calculated) : 66.1  Temp (24hrs), Avg:98.7 F (37.1 C), Min:97.8 F (36.6 C), Max:99.2 F (37.3 C)  Recent Labs  Lab 07/28/18 2012 07/28/18 2035 07/28/18 2301 07/29/18 0127 07/30/18 0607  WBC 4.5  --   --  2.0* 10.7*  CREATININE 0.59*  --   --  0.72 0.87  LATICACIDVEN 1.9 2.0* 2.1*  --   --     Estimated Creatinine Clearance: 76.2 mL/min (by C-G formula based on SCr of 0.87 mg/dL).    Allergies  Allergen Reactions  . Ibuprofen Rash  . Tylenol [Acetaminophen] Rash    Elicia Lamp, PharmD, BCPS Please check AMION for all [facility] [place] contact numbers Clinical Pharmacist 07/30/2018 8:34 AM

## 2018-07-30 NOTE — Progress Notes (Signed)
Per ABG order to page MD with results.  Dr Halford Chessman called w/ ABG results, per MD he will review results in chart.  Unit RT aware.

## 2018-07-30 NOTE — Progress Notes (Signed)
Initial Nutrition Assessment  DOCUMENTATION CODES:   Not applicable  INTERVENTION:    Osmolite 1.2 at 25 ml/h, increase by 10 ml every 4 hours to goal rate of 55 ml/h (1320 ml per day)  Pro-stat 30 ml once daily  Provides 1684 kcal, 88 gm protein, 1082 ml free water daily  NUTRITION DIAGNOSIS:   Inadequate oral intake related to inability to eat as evidenced by NPO status.  GOAL:   Patient will meet greater than or equal to 90% of their needs  MONITOR:   Vent status, TF tolerance, Labs  REASON FOR ASSESSMENT:   Ventilator, Consult Enteral/tube feeding initiation and management  ASSESSMENT:   58 yo male with PMH of COPD/emphysema, skin cancer, smoker, alcohol abuse who was admitted with COPD exacerbation. Developed alcohol withdrawal and respiratory failure requiring intubation and transfer to the ICU on 5/12.  Received MD Consult for TF initiation and management. OGT in place. Patient is being ruled out for COVID-19. First test was negative, second test pending.  Patient is currently intubated on ventilator support MV: 10.2 L/min Temp (24hrs), Avg:98.7 F (37.1 C), Min:97.8 F (36.6 C), Max:99.2 F (37.3 C)    Labs reviewed. Magnesium 1.4-->1.5 (L) Medications reviewed and include MVI, folic acid, thiamine, and magnesium sulfate.  Weight encounters reviewed. Patient with 6% weight loss within the past 6 months. This is not significant for the time frame.   Patient is at nutrition risk, given recent weight loss and history of COPD and alcohol abuse.   NUTRITION - FOCUSED PHYSICAL EXAM:  unable to complete--COVID r/o  Diet Order:   Diet Order            Diet NPO time specified  Diet effective now              EDUCATION NEEDS:   No education needs have been identified at this time  Skin:  Skin Assessment: Reviewed RN Assessment  Last BM:  5/12 (type 4)  Height:   Ht Readings from Last 1 Encounters:  07/29/18 5\' 7"  (1.702 m)    Weight:    Wt Readings from Last 1 Encounters:  07/30/18 57.5 kg    Ideal Body Weight:  67.3 kg  BMI:  Body mass index is 19.85 kg/m.  Estimated Nutritional Needs:   Kcal:  1650  Protein:  85-95 gm  Fluid:  1.8 L    Molli Barrows, RD, LDN, [place] Pager [phone] After Hours Pager [phone]

## 2018-07-30 NOTE — Significant Event (Signed)
Rapid Response Event Note  Overview: Time Called: 9563 Arrival Time: 8756 Event Type: Respiratory(Acute hypoxia with sats 70s, presumed COVID +)  Initial Focused Assessment: Upon arrival, Mr. Pursifull is alert, oriented to self, disoriented to place, time and situation. Disrupting medical equipment and trying to get OOB. CIWA 15. Nursing notified me regarding an increase in his oxygen demand following a desaturation episode and now requiring NRB mask at 15L to maintain sats > 92%. Mr. Tardif is in mild respiratory distress with minimal abdominal accessory muscle use. He does say he has SOB but able to speak in short phrases and move around the bed. HR 130s ST, BP 147/108, RR 35. Afebrile currently. Skin is pink, warm and clammy.  Chaney Malling NP was notified by nursing staff and orders received for [facility].   Interventions: -New PIV  Plan of Care (if not transferred): -Discussed plan with K. Schorr and additional ativan ordered.  -If he continues to be agitated and complicating his hypoxia/WOB, care will have to be escalated.  -Call primary svc and/or RRRN if further assistance is needed.  Event Summary: Name of Physician Notified: Chaney Malling NP at [place]    at    Outcome: Stayed in room and stabalized  Event End Time: 4332  Madelynn Done

## 2018-07-30 NOTE — Procedures (Signed)
Intubation Procedure Note Matvey Llanas [id number] 12/23/1960  Procedure: Intubation Indications: Respiratory insufficiency  Procedure Details Consent: Risks of procedure as well as the alternatives and risks of each were explained to the (patient/caregiver).  Consent for procedure obtained. Time Out: Verified patient identification, verified procedure, site/side was marked, verified correct patient position, special equipment/implants available, medications/allergies/relevent history reviewed, required imaging and test results available.  Performed  Maximum sterile technique was used including cap, gloves, gown, hand hygiene and mask.  MAC and 3  Given 2 mg versed, 50 mcg fentanyl, 20 mg etomidate, 6 mg vecuronium.  Inserted 7.5 ETT to 24 cm at lip with glidescope.  Confirmed with CO2 detector and auscultation.   Evaluation Hemodynamic Status: BP stable throughout; O2 sats: currently acceptable Patient's Current Condition: stable Complications: No apparent complications Patient did tolerate procedure well. Chest X-ray ordered to verify placement.  CXR: pending.  Chesley Mires, MD [facility] Pulmonary/Critical Care 07/30/2018, 8:46 AM

## 2018-07-30 NOTE — Progress Notes (Addendum)
0100 [name] paged due to HR in 130s and next ativan not available until 0245. 1mg  and 576ml bolus ordered  0230 [name] paged due to HR continuing to sustain in 140s. 2.5 metoprolol, 1mg  ativan, and 250 bolus ordered. Pt HR improved to 110s and pt resting.   0355 Reassessed VS after bolus/ativan administrations. Pt O2 saturation were 80%. Placed on 6L [place] with minimal improvement. NRB placed on patient. [name] notified and chest xray order placed.   0445 Pt increasingly agitated tachycardic and increased tachypnea. RRRN called and to bedside. New IV placed and 2mg  ativan administered per CIWA protocol. RRRN updated [name], another 2mg  ativan ordered and increase level of care to be discussed on day rounds.  0630 [name] paged again to update that pt remains on NRB saturating in mid 80s with HR in 140s. Still slightly restless but has improved. 5mg  metoprolol ordered and 596ml bolus.   0645 HR now 110s, STAT ABG ordered, RT notified to come get.

## 2018-07-30 NOTE — Progress Notes (Signed)
Repeat COVID from 07/29/18 negative.  Will d/c airborne and droplet isolation.  Chesley Mires, MD [facility] Pulmonary/Critical Care 07/30/2018, 11:05 AM

## 2018-07-30 NOTE — Progress Notes (Signed)
CSW is aware of patient being homeless, currently his COVID results are pending. Patient is currently intubated, CSW will continue monitoring for results and create plan for based on those results.  Madilyn Fireman, MSW, [place] Clinical Social Worker Emergency Department [employer] [phone]

## 2018-07-30 NOTE — Progress Notes (Signed)
PCCM INTERVAL PROGRESS NOTE  Patient now hypotensive after starting precedex infusion. MAP has remained in the 60s and 70s, but SBP has been 80s.  He is 1 L positive since admission yesterday.   Will give 1 liter LR and re-evaluate. Can try peripheral pressors. If requiring more than a low dose will place CVL.    Georgann Housekeeper, AGACNP-BC [place] Pager [phone] or [phone]  07/30/2018 4:11 PM

## 2018-07-30 NOTE — Progress Notes (Signed)
**Note De-Identified vi Obfusction** PROGRESS NOTE    Trevor Williams  ZOX:[id number] DOB: Jn 17, 1962 DOA: 07/28/2018 PCP: Mrliss Coots, NP  Brief Nrrtive:  Trevor Williams is  58 y.o. mle with history of tobcco buse nd drinks lcohol [place] becuse of persistent shortness of breth ongoing for lst 1 month.  Ptient sttes he is getting short of breth on exertion which is progressively worsened.  At times he lso gets some centrl chest pin which lst for few minutes.  EMS ws clled nd ptient ws plced on nonrebrether given epinephrine Solu-Medrol mgnesium nd lbuterol nd ws brought to the ER.  Assessment & Pln:   Principl Problem:   Acute respirtory filure with hypoxi (HCC) Active Problems:   Community cquired pneumoni  Trnsferred to ICU due to AHRF nd etoh withdrwl  1. Acute respirtory filure with hypoxi secondry to pneumoni nd possible COPD 1. COVID 19 negtive in ED - he is homeless, uses public trnsporttion, but otherwise, no known risk fctors.  2nd COVID test pending.  Consider keeping on precutions given ground glss opcities on imging. 2. Repet CXR on 5/12 with lrge R lung multilobr pneumoni nd/or spirtion 3. Worsening hypoxi overnight -> hypoxic on nonrebrether -> PCCM consulted 4. Negtive RVP 5. Sputum cx pending.  Urine strep/legionell. 6. Blood cx pending. 7. Albuterol inhlers prn nd scheduled - consider dding steroids if SOB worsens, hold for now, pt on RA 8. Ceftrixone/zithromycin 9. Elevted BNP, stble, pt ppers euvolemic.  Hold off on echo nd diuresis for now.   2. Alcohol Withdrwl: worsening sx overnight, will consult PCCM in setting of his AHRF bove  3. Chest pin -ptient hs been hving chest pin off nd on.  Most of the symptoms re exertionl.   1. Troponin negtive x3.  EKG with rtificl, T wve inversions in V4, V5.  Appers similr to priors. 2. CT scn of the chest lso shows possible  coronry disese.   3. Consider echo   4. Thrombocytopeni likely from lcoholism.  Follow CBC. 5. Leukopeni: negtive HIV.  Follow diff. Mild neutropeni tody.  6. Microcytic nemi check nemi pnel. 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  Hypoprolif retic. 7. Tobcco buse -tobcco cesstion counseling requested. 8. Elevted liver enzymes: 2/2 etoh, continue to monitor   DVT prophylxis: SCD Code Sttus: full  Fmily Communiction: none t bedside Disposition Pln: pending   Consultnts:   none  Procedures:   none  Antimicrobils:  Anti-infectives (From dmission, onwrd)   Strt     Dose/Rte Route Frequency Ordered Stop   07/31/18 0200  vncomycin (VANCOCIN) IVPB 750 mg/150 ml premix     750 mg 150 mL/hr over 60 Minutes Intrvenous Every 12 hours 07/30/18 0834     07/30/18 1000  cefTRIAXone (ROCEPHIN) 2 g in sodium chloride 0.9 % 100 mL IVPB     2 g 200 mL/hr over 30 Minutes Intrvenous Every 24 hours 07/30/18 0832 08/05/18 0959   07/30/18 0845  vncomycin (VANCOCIN) 1,250 mg in sodium chloride 0.9 % 250 mL IVPB     1,250 mg 166.7 mL/hr over 90 Minutes Intrvenous  Once 07/30/18 0831     07/29/18 2000  cefTRIAXone (ROCEPHIN) 1 g in sodium chloride 0.9 % 100 mL IVPB  Sttus:  Discontinued     1 g 200 mL/hr over 30 Minutes Intrvenous Every 24 hours 07/29/18 0114 07/30/18 0832   07/29/18 2000  zithromycin (ZITHROMAX) 500 mg in sodium chloride 0.9 % 250 mL IVPB  500 mg 250 mL/hr over 60 Minutes Intravenous Every 24 hours 07/29/18 0114 08/05/18 1959   07/28/18 2215  cefTRIAXone (ROCEPHIN) 1 g in sodium chloride 0.9 % 100 mL IVPB     1 g 200 mL/hr over 30 Minutes Intravenous  Once 07/28/18 2203 07/28/18 2326   07/28/18 2215  azithromycin (ZITHROMAX) 500 mg in sodium chloride 0.9 % 250 mL IVPB     500 mg 250 mL/hr over 60 Minutes Intravenous  Once 07/28/18 2203 07/28/18 2354     Subjective: Short of breath, not able to say much  Objective: Vitals:    07/30/18 1200 07/30/18 1300 07/30/18 1346 07/30/18 1400  BP: '92/74 92/78 92/78 '$ (!) 85/69  Pulse:  95 (!) 114 95  Resp: 20 20 (!) 27 20  Temp:      TempSrc:      SpO2:  100% 97% 100%  Weight:      Height:        Intake/Output Summary (Last 24 hours) at 07/30/2018 1502 Last data filed at 07/30/2018 1400 Gross per 24 hour  Intake 1435.43 ml  Output 251 ml  Net 1184.43 ml   Filed Weights   07/29/18 0313 07/30/18 0448 07/30/18 0531  Weight: 58.1 kg 93.5 kg 57.5 kg    Examination:  General: No acute distress. Cardiovascular: tachycardic Lungs: increased wob Abdomen: Soft, nontender, nondistended  Neurological: Alert. Moves all extremities 4. Cranial nerves II through XII grossly intact. Skin: Warm and dry. No rashes or lesions. Extremities: No clubbing or cyanosis. No edema.    Data Reviewed: I have personally reviewed following labs and imaging studies  CBC: Recent Labs  Lab 07/28/18 2012 07/29/18 0127 07/30/18 0607 07/30/18 1017  WBC 4.5 2.0* 10.7*  --   NEUTROABS 1.5* 1.4*  --   --   HGB 11.2* 11.2* 10.6* 13.3  HCT 33.6* 33.6* 31.8* 39.0  MCV 110.5* 110.2* 111.2*  --   PLT 47* 46* 42*  --    Basic Metabolic Panel: Recent Labs  Lab 07/28/18 2012 07/29/18 0127 07/30/18 0607 07/30/18 0838 07/30/18 1017  NA 144 142 136  --  139  K 3.9 4.0 3.5  --  4.0  CL 108 103 104  --   --   CO2 20* 22 21*  --   --   GLUCOSE 98 126* 210*  --   --   BUN '11 8 13  '$ --   --   CREATININE 0.59* 0.72 0.87  --   --   CALCIUM 8.8* 8.2* 8.3*  --   --   MG  --   --  1.4* 1.5*  --   PHOS  --   --   --  5.0*  --    GFR: Estimated Creatinine Clearance: 76.2 mL/min (by C-G formula based on SCr of 0.87 mg/dL). Liver Function Tests: Recent Labs  Lab 07/28/18 2012 07/29/18 0127 07/30/18 0607  AST 197* 175* 152*  ALT 74* 75* 68*  ALKPHOS 107 109 96  BILITOT 0.7 0.7 1.1  PROT 7.3 7.6 7.0  ALBUMIN 3.6 3.6 3.5   No results for input(s): LIPASE, AMYLASE in the last 168 hours.  No results for input(s): AMMONIA in the last 168 hours. Coagulation Profile: No results for input(s): INR, PROTIME in the last 168 hours. Cardiac Enzymes: Recent Labs  Lab 07/28/18 2012 07/29/18 0127 07/29/18 0748 07/29/18 1418  TROPONINI <0.03 <0.03 <0.03 0.03*   BNP (last 3 results) No results for input(s): PROBNP in the last 8760  hours. Hb1C: No results for input(s): HGB1C in the last 72 hours. CBG: Recent Labs  Lab 07/30/18 1157  GLUCP 193*   Lipid Profile: No results for input(s): CHOL, HDL, LDLCLC, TRIG, CHOLHDL, LDLDIRECT in the last 72 hours. Thyroid Function Tests: No results for input(s): TSH, T4TOTL, FREET4, T3FREE, THYROIDB in the last 72 hours. nemia Panel: Recent Labs    07/29/18 0748  VITMINB12 394  FOLTE 5.2*  FERRITIN 1,371*  TIBC 270  IRON 134  RETICCTPCT 0.9   Sepsis Labs: Recent Labs  Lab 07/28/18 2012 07/28/18 2035 07/28/18 2301  LTICCIDVEN 1.9 2.0* 2.1*    Recent Results (from the past 240 hour(s))  SRS Coronavirus 2 [facility] order, Performed in [place] hospital lab)     Status: None   Collection Time: 07/28/18  8:36 PM  Result Value Ref Range Status   SRS Coronavirus 2 NEGTIVE NEGTIVE Final    Comment: (NOTE) If result is NEGTIVE SRS-CoV-2 target nucleic acids are NOT DETECTED. The SRS-CoV-2 RN is generally detectable in upper and lower  respiratory specimens during the acute phase of infection. The lowest  concentration of SRS-CoV-2 viral copies this assay can detect is 250  copies / mL.  negative result does not preclude SRS-CoV-2 infection  and should not be used as the sole basis for treatment or other  patient management decisions.   negative result may occur with  improper specimen collection / handling, submission of specimen other  than nasopharyngeal swab, presence of viral mutation(s) within the  areas targeted by this assay, and inadequate number of viral copies  (<250 copies / mL).   negative result must be combined with clinical  observations, patient history, and epidemiological information. If result is POSITIVE SRS-CoV-2 target nucleic acids are DETECTED. The SRS-CoV-2 RN is generally detectable in upper and lower  respiratory specimens dur ing the acute phase of infection.  Positive  results are indicative of active infection with SRS-CoV-2.  Clinical  correlation with patient history and other diagnostic information is  necessary to determine patient infection status.  Positive results do  not rule out bacterial infection or co-infection with other viruses. If result is PRESUMPTIVE POSTIVE SRS-CoV-2 nucleic acids MY BE PRESENT.    presumptive positive result was obtained on the submitted specimen  and confirmed on repeat testing.  While 2019 novel coronavirus  (SRS-CoV-2) nucleic acids may be present in the submitted sample  additional confirmatory testing may be necessary for epidemiological  and / or clinical management purposes  to differentiate between  SRS-CoV-2 and other Sarbecovirus currently known to infect humans.  If clinically indicated additional testing with an alternate test  methodology [phone]) is advised. The SRS-CoV-2 RN is generally  detectable in upper and lower respiratory sp ecimens during the acute  phase of infection. The expected result is Negative. Fact Sheet for Patients:  StrictlyIdeas.no Fact Sheet for Healthcare Providers: BankingDealers.co.za This test is not yet approved or cleared by the [place] [facility] and has been authorized for detection and/or diagnosis of SRS-CoV-2 by FD under an Emergency Use uthorization (EU).  This EU will remain in effect (meaning this test can be used) for the duration of the COVID-19 declaration under Section 564(b)(1) of the ct, 21 U.S.C. section 360bbb-3(b)(1), unless the authorization is terminated or revoked sooner. Performed  at [facility], [place] [address], [place], [place] [address]   Novel Coronavirus,N,(SEND-OUT TO REF LB - TT 24-48 hrs); Hosp Order     Status: None **Note De-Identified vi Obfusction** Collection Time: 07/28/18  8:36 PM  Result Vlue Ref Rnge Sttus   SRS-CoV-2, N NOT DETECTED NOT DETECTED Finl    Comment: (NOTE) This test ws developed nd its performnce chrcteristics determined by [employer]. This test hs not been FD clered or pproved. This test hs been uthorized by FD under n Emergency Use uthoriztion (EU). This test is only uthorized for the durtion of time the declrtion tht circumstnces exist justifying the uthoriztion of the emergency use of in vitro dignostic tests for detection of SRS-CoV-2 virus nd/or dignosis of COVID-19 infection under section 564(b)(1) of the ct, 21 U.S.C. 267TIW-5(Y)(0), unless the uthoriztion is terminted or revoked sooner. When dignostic testing is negtive, the possibility of  flse negtive result should be considered in the context of  ptient's recent exposures nd the presence of clinicl signs nd symptoms consistent with COVID-19. n individul without symptoms of COVID-19 nd who is not shedding SRS-CoV-2 virus would expect to hve  negtive (not detected) result in this ssy. Performed  t: By [facility] [address] [place], [place] [id number] Rush Frmer MD NL:[phone]    Jv  Finl    Comment: Performed t [facility], [place] [address], [place], [place] [address]  Respirtory Pnel by PCR     Sttus: None   Collection Time: 07/28/18  8:36 PM  Result Vlue Ref Rnge Sttus   denovirus NOT DETECTED NOT DETECTED Finl   Coronvirus 229E NOT DETECTED NOT DETECTED Finl    Comment: (NOTE) The Coronvirus on the Respirtory Pnel, DOES NOT test for the novel  Coronvirus (2019 nCoV)    Coronvirus HKU1 NOT DETECTED NOT DETECTED Finl   Coronvirus NL63 NOT DETECTED  NOT DETECTED Finl   Coronvirus OC43 NOT DETECTED NOT DETECTED Finl   Metpneumovirus NOT DETECTED NOT DETECTED Finl   Rhinovirus / Enterovirus NOT DETECTED NOT DETECTED Finl   Influenz  NOT DETECTED NOT DETECTED Finl   Influenz B NOT DETECTED NOT DETECTED Finl   Prinfluenz Virus 1 NOT DETECTED NOT DETECTED Finl   Prinfluenz Virus 2 NOT DETECTED NOT DETECTED Finl   Prinfluenz Virus 3 NOT DETECTED NOT DETECTED Finl   Prinfluenz Virus 4 NOT DETECTED NOT DETECTED Finl   Respirtory Syncytil Virus NOT DETECTED NOT DETECTED Finl   Bord[place]ell pertussis NOT DETECTED NOT DETECTED Finl   Chlmydophil pneumonie NOT DETECTED NOT DETECTED Finl   Mycoplsm pneumonie NOT DETECTED NOT DETECTED Finl    Comment: Performed t [facility], [place]. [address] [place], [place] [address]  Blood culture (routine x 2)     Sttus: None (Preliminry result)   Collection Time: 07/28/18 10:25 PM  Result Vlue Ref Rnge Sttus   Specimen Description BLOOD RIGHT UPPER RM  Finl   Specil Requests   Finl    BOTTLES DRWN EROBIC ND NEROBIC Blood Culture dequte volume   Culture   Finl    NO GROWTH 2 DYS Performed t [facility], [address] [place], [place] [address]    Report Sttus PENDING  Incomplete  Blood culture (routine x 2)     Sttus: None (Preliminry result)   Collection Time: 07/28/18 10:36 PM  Result Vlue Ref Rnge Sttus   Specimen Description BLOOD RIGHT RM  Finl   Specil Requests   Finl    BOTTLES DRWN EROBIC ND NEROBIC Blood Culture dequte volume   Culture   Finl    NO GROWTH 2 DYS Performed t [facility], **Note De-Identified vi Obfusction** [address] [place], [place] [address]    Report Sttus PENDING  Incomplete  Culture, sputum-ssessment     Sttus: None   Collection Time: 07/29/18  3:14 PM  Result Vlue Ref Rnge Sttus   Specimen Description EXPECTORTED SPUTUM  Finl   Specil Requests NONE  Finl   Sputum evlution   Finl     Sputum specimen not cceptble for testing.  Plese recollect.   RESULT CLLED TO, RED BCK BY ND VERIFIED WITH: Brennn Biley RN 1940 07/29/18  BROWNING Performed t [facility], [place] [address] [place], [place] [address]    Report Sttus 07/29/2018 FINL  Finl  MRS PCR Screening     Sttus: None   Collection Time: 07/30/18 10:00 M  Result Vlue Ref Rnge Sttus   MRS by PCR NEGTIVE NEGTIVE Finl    Comment:        The GeneXpert MRS ssy (FD pproved for NSL specimens only), is one component of  comprehensive MRS coloniztion surveillnce progrm. It is not intended to dignose MRS infection nor to guide or monitor tretment for MRS infections. Performed t [facility], [place] [address], [place], [place] [address]   Culture, respirtory (non-expectorted)     Sttus: None (Preliminry result)   Collection Time: 07/30/18 10:21 M  Result Vlue Ref Rnge Sttus   Specimen Description TRCHEL SPIRTE  Finl   Specil Requests NONE  Finl   Grm Stin   Finl    NO WBC SEEN NO ORGNISMS SEEN Performed t [facility], [place] [address] [place], [place] [address]    Culture PENDING  Incomplete   Report Sttus PENDING  Incomplete         Rdiology Studies: Dg Chest 1 View  Result Dte: 07/30/2018 CLINICL DT:  44 yer old mle with COVID-19 sttus pending. Intubted. EXM: CHEST  1 VIEW COMPRISON:  0413 hours tody nd erlier. FINDINGS: Portble P semi upright view t 0904 hours. Endotrchel tube tip in good position between the level the clvicles nd crin. The ptient is mildly rotted to the right. Continued confluent right mid nd lower lung consolidtion. No pneumothorx or pulmonry edem. The left lung remins cler. Stble crdic size nd medistinl contours. IMPRESSION: 1. Endotrchel tube tip in good position. 2. Continued confluent right mid nd lower lung consolidtion sen erlier tody. Electroniclly Signed   By: Genevie nn M.D.    On: 07/30/2018 09:42   Ct ngio Chest Pe W nd/or Wo Contrst  Result Dte: 07/28/2018 CLINICL DT:  Shortness of breth, evlute for PE EXM: CT NGIOGRPHY CHEST WITH CONTRST TECHNIQUE: Multidetector CT imging of the chest ws performed using the stndrd protocol during bolus dministrtion of intrvenous contrst. Multiplnr CT imge reconstructions nd MIPs were obtined to evlute the vsculr ntomy. CONTRST:  112m OMNIPQUE IOHEXOL 350 MG/ML SOLN COMPRISON:  12/24/2014 FINDINGS: Crdiovsculr: Stisfctory opcifiction of the pulmonry rteries to the segmentl level. No evidence of pulmonry embolism. Scttered coronry rtery clcifictions. Norml hert size. No pericrdil effusion. Medistinum/Nodes: No enlrged medistinl, hilr, or xillry lymph nodes. Thyroid glnd, trche, nd esophgus demonstrte no significnt findings. Lungs/Pleur: Moderte centrilobulr emphysem. There is clustered centrilobulr nd ground-glss opcity of the right upper lobe (series 7, imge 76) nd of the superior segment left lower lobe (series 7, imge 82). Diffuse bilterl bronchil wll thickening. No pleurl effusion or pneumothorx. Upper bdomen: No cute bnormlity. Musculoskeletl: No chest wll bnormlity. No cute or significnt osseous findings. Review of the MIP imges confirms the  above findings. IMPRESSION: 1.  Negative examination for pulmonary embolism. 2. There is clustered centrilobular and ground-glass opacity of the right upper lobe (series 7, image 76) and of the superior segment left lower lobe (series 7, image 82), consistent with multifocal infection. 3. Diffuse bilateral nonspecific infectious or inflammatory bronchial wall thickening. 4.  Emphysema. 5.  Coronary artery disease. Electronically Signed   By: Eddie Candle M.D.   On: 07/28/2018 21:58   Dg Chest Port 1 View  Result Date: 07/30/2018 CLINICAL DATA:  58 year old male with hypoxia. EXAM: PORTABLE CHEST 1 VIEW  COMPARISON:  CTA chest 07/28/2018 and earlier. FINDINGS: Portable AP upright view at 0403 hours new large area of confluent opacity and consolidation in the right mid and lower lung since 07/28/2018. No superimposed pneumothorax. No pleural effusion is evident. The left lung remain stable. Stable cardiac size and mediastinal contours. Visualized tracheal air column is within normal limits. Stable visualized osseous structures. Negative visible bowel gas pattern. IMPRESSION: Large right lung multilobar pneumonia and/or aspiration is new since 07/28/2018. These results will be called to the ordering clinician or representative by the Radiologist Assistant, and communication documented in the PACS or zVision Dashboard. Electronically Signed   By: Genevie Ann M.D.   On: 07/30/2018 04:48   Dg Chest Portable 1 View  Result Date: 07/28/2018 CLINICAL DATA:  Shortness of breath and cough EXAM: PORTABLE CHEST 1 VIEW COMPARISON:  06/01/2018 FINDINGS: Cardiac shadow is stable. Aortic calcifications are again seen. The lungs are well aerated bilaterally. No acute infiltrate is seen. Old rib fractures are noted on the left. IMPRESSION: No active disease. Electronically Signed   By: Inez Catalina M.D.   On: 07/28/2018 20:34        Scheduled Meds: . chlorhexidine gluconate (MEDLINE KIT)  15 mL Mouth Rinse BID  . [START ON 07/31/2018] feeding supplement (PRO-STAT SUGAR FREE 64)  30 mL Per Tube Daily  . folic acid  1 mg Oral Daily  . ipratropium-albuterol  3 mL Nebulization Q6H  . mouth rinse  15 mL Mouth Rinse 10 times per day  . multivitamin with minerals  1 tablet Oral Daily  . nicotine  21 mg Transdermal Daily  . pantoprazole sodium  40 mg Per Tube Q24H  . sterile water (preservative free)      . succinylcholine      . thiamine  100 mg Oral Daily   Or  . thiamine  100 mg Intravenous Daily   Continuous Infusions: . azithromycin Stopped (07/29/18 2316)  . cefTRIAXone (ROCEPHIN)  IV    . dexmedetomidine  (PRECEDEX) IV infusion 0.4 mcg/kg/hr (07/30/18 1424)  . feeding supplement (OSMOLITE 1.2 CAL)    . fentaNYL infusion INTRAVENOUS 200 mcg/hr (07/30/18 1400)  . lactated ringers Stopped (07/30/18 1302)  . vancomycin    . [START ON 07/31/2018] vancomycin       LOS: 1 day    Time spent: over 30 min    Fayrene Helper, MD [facility] Pager AMION  If 7PM-7AM, please contact night-coverage [email or web address] Password [facility] 07/30/2018, 3:02 PM

## 2018-07-30 NOTE — Progress Notes (Signed)
Upon this RN arrival patient was off the telemetry monitor. Tele replaced and patient found to be tachycardic in the 120's. CIWA protocol and 1mg  ativan given.

## 2018-07-30 NOTE — Progress Notes (Signed)
[facility] Progress Note Patient Name: Trevor Williams DOB: 1960-03-25 MRN: [id number]   Date of Service  07/30/2018  HPI/Events of Note  Hypotension on Precedex infusion. He is on a Norepinephrine infusion.  eICU Interventions  Drop the Precedex infusion rate from 0.8 to 0.2. Wean Norepinephrine as tolerated for MAP goal of 65 mmHg or >.        [place] U Ogan 07/30/2018, 10:48 PM

## 2018-07-30 NOTE — Consult Note (Signed)
NAME:  Trevor Williams, MRN:  [id number], DOB:  1960/09/16, LOS: 1 ADMISSION DATE:  07/28/2018, CONSULTATION DATE:  07/30/2018 REFERRING MD:  Dr. Florene Glen, [facility], CHIEF COMPLAINT:  Short of breath   Brief History   58 yo male smoker with hx of ETOH presented with dyspnea, wheezing and hypoxia.  Treated for COPD exacerbation.  Developed alcohol withdrawal, respiratory failure, and Rt lower lung infiltrate.  Transferred to ICU 5/12.  Past Medical History  ETOH, COPD/emphysema, Skin cancer  Significant Hospital Events   5/10 Admit 5/12 transfer to ICU  Consults:    Procedures:  ETT 5/12 >>  Significant Diagnostic Tests:  CT angio chest 5/10 >> moderate centrilobular emphysema, GGO RUL and LLL, bronchial thickening  Micro Data:  COVID (in house) 5/10 >> negative RVP 5/10 >> negative Blood 5/10 >> COVID (send out) 5/10 >> Legionella Ag 5/11 >> Pneumococcal Ag 5/11 >> Sputum 5/12 >>   Antimicrobials:  Rocephin 5/10 >> Zithromax 5/10 >> Vancomycin 5/12 >>   Interim history/subjective:    Objective   Blood pressure (!) 147/108, pulse (!) 142, temperature 97.8 F (36.6 C), temperature source Oral, resp. rate (!) 40, height 5\' 7"  (1.702 m), weight 57.5 kg, SpO2 (!) 85 %.        Intake/Output Summary (Last 24 hours) at 07/30/2018 0755 Last data filed at 07/30/2018 5732 Gross per 24 hour  Intake 1395.88 ml  Output 301 ml  Net 1094.88 ml   Filed Weights   07/29/18 0313 07/30/18 0448 07/30/18 0531  Weight: 58.1 kg 93.5 kg 57.5 kg    Examination:  General - cachectic, using accessory muscles Eyes - pupils reactive ENT - edentulous Cardiac - regular, tachycardic Chest - poor air movement, decreased BS at bases Rt > Lt, b/l rhonchi Abdomen - soft, non tender, decreased bowel sounds Extremities - no cyanosis, clubbing, or edema Skin - no rashes Neuro - moves extremities  CXR (reviewed by me) - RLL consolidation     Resolved Hospital Problem list     Assessment &  Plan:   Acute hypoxic respiratory failure from Rt lower Lobar PNA and COPD exacerbation. Tobacco abuse. Hx of COPD with centrilobular emphysema. Plan - intubate, full vent support - goal SpO2 90 to 95% - add vancomycin, continue rocephin/zithromax - f/u CXR, ABG - scheduled BDs - don't think he needs additional steroids at present  Acute metabolic encephalopathy. Alcohol withdrawal. Plan - RASS goal -1 to -2 - precedex/fentanyl gtt with prn versed - thiamine, folic acid, MVI  Severe protein calorie malnutrition. Plan - tube feeds - monitor for refeeding syndrome  Anemia of critical illness and chronic disease. Thrombocytopenia in setting of ETOH. Plan - f/u CBC   Best practice:  Diet: tube feeds DVT prophylaxis: lovenox GI prophylaxis: protonix Mobility: bed rest Code Status: full code Disposition: ICU  Labs   CBC: Recent Labs  Lab 07/28/18 2012 07/29/18 0127 07/30/18 0607  WBC 4.5 2.0* 10.7*  NEUTROABS 1.5* 1.4*  --   HGB 11.2* 11.2* 10.6*  HCT 33.6* 33.6* 31.8*  MCV 110.5* 110.2* 111.2*  PLT 47* 46* 42*    Basic Metabolic Panel: Recent Labs  Lab 07/28/18 2012 07/29/18 0127 07/30/18 0607  NA 144 142 136  K 3.9 4.0 3.5  CL 108 103 104  CO2 20* 22 21*  GLUCOSE 98 126* 210*  BUN 11 8 13   CREATININE 0.59* 0.72 0.87  CALCIUM 8.8* 8.2* 8.3*  MG  --   --  1.4*   GFR: Estimated  Creatinine Clearance: 76.2 mL/min (by C-G formula based on SCr of 0.87 mg/dL). Recent Labs  Lab 07/28/18 2012 07/28/18 2035 07/28/18 2301 07/29/18 0127 07/30/18 0607  WBC 4.5  --   --  2.0* 10.7*  LATICACIDVEN 1.9 2.0* 2.1*  --   --     Liver Function Tests: Recent Labs  Lab 07/28/18 2012 07/29/18 0127 07/30/18 0607  AST 197* 175* 152*  ALT 74* 75* 68*  ALKPHOS 107 109 96  BILITOT 0.7 0.7 1.1  PROT 7.3 7.6 7.0  ALBUMIN 3.6 3.6 3.5   No results for input(s): LIPASE, AMYLASE in the last 168 hours. No results for input(s): AMMONIA in the last 168 hours.   ABG No results found for: PHART, PCO2ART, PO2ART, HCO3, TCO2, ACIDBASEDEF, O2SAT   Coagulation Profile: No results for input(s): INR, PROTIME in the last 168 hours.  Cardiac Enzymes: Recent Labs  Lab 07/28/18 2012 07/29/18 0127 07/29/18 0748 07/29/18 1418  TROPONINI <0.03 <0.03 <0.03 0.03*    HbA1C: No results found for: HGBA1C  CBG: No results for input(s): GLUCAP in the last 168 hours.  Review of Systems:   Unable to obtain  Past Medical History  He,  has a past medical history of Cancer ([place]) and Emphysema of lung ([place]).   Surgical History   History reviewed. No pertinent surgical history.   Social History   reports that he has been smoking cigarettes. He has been smoking about 1.00 pack per day. He uses smokeless tobacco. He reports current alcohol use. He reports that he does not use drugs.   Family History   His family history includes Hypertension in his maternal grandfather.   Allergies Allergies  Allergen Reactions  . Ibuprofen Rash  . Tylenol [Acetaminophen] Rash     Home Medications  Prior to Admission medications   Medication Sig Start Date End Date Taking? Authorizing Provider  Aspirin-Acetaminophen-Caffeine (GOODY HEADACHE PO) Take 2 packets by mouth daily as needed (pain/headache).    [provider]  doxycycline (VIBRAMYCIN) 100 MG capsule Take 1 capsule (100 mg total) by mouth 2 (two) times daily. 06/01/18   Ashley Murrain, NP  potassium chloride (K-DUR) 10 MEQ tablet Take 2 tablets (20 mEq total) by mouth daily for 3 days. 03/04/18 03/07/18  Corrie Dandy, MD  predniSONE (DELTASONE) 10 MG tablet Take 4 tablets (40 mg total) by mouth daily. 06/01/18   Ashley Murrain, NP     Critical care time: 38 minutes independent of procedure time.     Chesley Mires, MD [facility] Pulmonary/Critical Care 07/30/2018, 8:52 AM

## 2018-07-31 ENCOUNTER — Inpatient Hospital Stay (HOSPITAL_COMMUNITY): Payer: Medicaid Other

## 2018-07-31 LAB — BLOOD GAS, ARTERIAL
Acid-Base Excess: 0.3 mmol/L (ref 0.0–2.0)
Bicarbonate: 24.9 mmol/L (ref 20.0–28.0)
Drawn by: 55062
FIO2: 60
MECHVT: 530 mL
O2 Saturation: 94 %
PEEP: 10 cmH2O
Patient temperature: 99.2
RATE: 20 resp/min
pCO2 arterial: 43.9 mmHg (ref 32.0–48.0)
pH, Arterial: 7.373 (ref 7.350–7.450)
pO2, Arterial: 75.8 mmHg — ABNORMAL LOW (ref 83.0–108.0)

## 2018-07-31 LAB — COMPREHENSIVE METABOLIC PANEL
ALT: 53 U/L — ABNORMAL HIGH (ref 0–44)
AST: 71 U/L — ABNORMAL HIGH (ref 15–41)
Albumin: 2.9 g/dL — ABNORMAL LOW (ref 3.5–5.0)
Alkaline Phosphatase: 69 U/L (ref 38–126)
Anion gap: 9 (ref 5–15)
BUN: 18 mg/dL (ref 6–20)
CO2: 23 mmol/L (ref 22–32)
Calcium: 7.9 mg/dL — ABNORMAL LOW (ref 8.9–10.3)
Chloride: 109 mmol/L (ref 98–111)
Creatinine, Ser: 0.79 mg/dL (ref 0.61–1.24)
GFR calc Af Amer: >60 mL/min (ref >60)
GFR calc non Af Amer: >60 mL/min (ref >60)
Glucose, Bld: 71 mg/dL (ref 70–99)
Potassium: 3.6 mmol/L (ref 3.5–5.1)
Sodium: 141 mmol/L (ref 135–145)
Total Bilirubin: 0.8 mg/dL (ref 0.3–1.2)
Total Protein: 6.1 g/dL — ABNORMAL LOW (ref 6.5–8.1)

## 2018-07-31 LAB — CBC
HCT: 27.3 % — ABNORMAL LOW (ref 39.0–52.0)
Hemoglobin: 9 g/dL — ABNORMAL LOW (ref 13.0–17.0)
MCH: 36.4 pg — ABNORMAL HIGH (ref 26.0–34.0)
MCHC: 33 g/dL (ref 30.0–36.0)
MCV: 110.5 fL — ABNORMAL HIGH (ref 80.0–100.0)
Platelets: 34 10*3/uL — ABNORMAL LOW (ref 150–400)
RBC: 2.47 MIL/uL — ABNORMAL LOW (ref 4.22–5.81)
RDW: 12.7 % (ref 11.5–15.5)
WBC: 6.9 10*3/uL (ref 4.0–10.5)
nRBC: 0 % (ref 0.0–0.2)

## 2018-07-31 LAB — PHOSPHORUS
Phosphorus: 2.4 mg/dL — ABNORMAL LOW (ref 2.5–4.6)
Phosphorus: 2.8 mg/dL (ref 2.5–4.6)

## 2018-07-31 LAB — MAGNESIUM
Magnesium: 2.1 mg/dL (ref 1.7–2.4)
Magnesium: 2 mg/dL (ref 1.7–2.4)

## 2018-07-31 LAB — GLUCOSE, CAPILLARY
Glucose-Capillary: 103 mg/dL — ABNORMAL HIGH (ref 70–99)
Glucose-Capillary: 108 mg/dL — ABNORMAL HIGH (ref 70–99)
Glucose-Capillary: 121 mg/dL — ABNORMAL HIGH (ref 70–99)
Glucose-Capillary: 149 mg/dL — ABNORMAL HIGH (ref 70–99)
Glucose-Capillary: 156 mg/dL — ABNORMAL HIGH (ref 70–99)
Glucose-Capillary: 197 mg/dL — ABNORMAL HIGH (ref 70–99)
Glucose-Capillary: 64 mg/dL — ABNORMAL LOW (ref 70–99)
Glucose-Capillary: 82 mg/dL (ref 70–99)

## 2018-07-31 MED ORDER — MIDAZOLAM BOLUS VIA INFUSION
1.0000 mg | INTRAVENOUS | Status: DC | PRN
  Administered 2018-08-01 (×3): 2 mg via INTRAVENOUS
  Administered 2018-08-01 (×2): 1 mg via INTRAVENOUS
  Administered 2018-08-02 (×2): 2 mg via INTRAVENOUS
  Filled 2018-07-31: qty 2

## 2018-07-31 MED ORDER — MIDAZOLAM 50MG/50ML (1MG/ML) PREMIX INFUSION
0.0000 mg/h | INTRAVENOUS | Status: DC
  Administered 2018-07-31 – 2018-08-01 (×2): 2 mg/h via INTRAVENOUS
  Administered 2018-08-01: 3 mg/h via INTRAVENOUS
  Administered 2018-08-02: 16:00:00 10 mg/h via INTRAVENOUS
  Administered 2018-08-02: 11:00:00 5 mg/h via INTRAVENOUS
  Administered 2018-08-03: 7 mg/h via INTRAVENOUS
  Filled 2018-07-31 (×8): qty 50

## 2018-07-31 MED ORDER — DEXMEDETOMIDINE HCL IN NACL 400 MCG/100ML IV SOLN
0.0000 ug/kg/h | INTRAVENOUS | Status: DC
  Administered 2018-07-31: 19:00:00 1.2 ug/kg/h via INTRAVENOUS
  Administered 2018-07-31: 01:00:00 0.6 ug/kg/h via INTRAVENOUS
  Administered 2018-07-31: 1.2 ug/kg/h via INTRAVENOUS
  Administered 2018-07-31: 08:00:00 1.1 ug/kg/h via INTRAVENOUS
  Administered 2018-07-31 – 2018-08-01 (×2): 1.2 ug/kg/h via INTRAVENOUS
  Administered 2018-08-01: 23:00:00 1 ug/kg/h via INTRAVENOUS
  Administered 2018-08-01: 15:00:00 0.6 ug/kg/h via INTRAVENOUS
  Administered 2018-08-02: 19:00:00 0.4 ug/kg/h via INTRAVENOUS
  Administered 2018-08-03: 01:00:00 0.6 ug/kg/h via INTRAVENOUS
  Filled 2018-07-31 (×11): qty 100

## 2018-07-31 MED ORDER — SODIUM CHLORIDE 0.9 % IV SOLN
100.0000 mg | Freq: Two times a day (BID) | INTRAVENOUS | Status: DC
  Administered 2018-07-31: 100 mg via INTRAVENOUS
  Filled 2018-07-31 (×3): qty 100

## 2018-07-31 MED ORDER — CHLORHEXIDINE GLUCONATE CLOTH 2 % EX PADS
6.0000 | MEDICATED_PAD | Freq: Every day | CUTANEOUS | Status: DC
  Administered 2018-07-31 – 2018-08-11 (×12): 6 via TOPICAL

## 2018-07-31 MED ORDER — DEXTROSE 50 % IV SOLN
INTRAVENOUS | Status: AC
  Administered 2018-07-31: 25 mL
  Filled 2018-07-31: qty 50

## 2018-07-31 MED ORDER — POTASSIUM PHOSPHATES 15 MMOLE/5ML IV SOLN
10.0000 mmol | Freq: Once | INTRAVENOUS | Status: AC
  Administered 2018-07-31: 10 mmol via INTRAVENOUS
  Filled 2018-07-31: qty 3.33

## 2018-07-31 MED ORDER — INSULIN ASPART 100 UNIT/ML ~~LOC~~ SOLN
2.0000 [IU] | SUBCUTANEOUS | Status: DC
  Administered 2018-07-31 (×2): 4 [IU] via SUBCUTANEOUS
  Administered 2018-07-31 – 2018-08-02 (×6): 2 [IU] via SUBCUTANEOUS
  Administered 2018-08-02 (×2): 4 [IU] via SUBCUTANEOUS
  Administered 2018-08-02 (×3): 2 [IU] via SUBCUTANEOUS
  Administered 2018-08-02: 4 [IU] via SUBCUTANEOUS
  Administered 2018-08-03: 20:00:00 2 [IU] via SUBCUTANEOUS
  Administered 2018-08-03: 6 [IU] via SUBCUTANEOUS
  Administered 2018-08-03: 11:00:00 2 [IU] via SUBCUTANEOUS
  Administered 2018-08-03: 04:00:00 6 [IU] via SUBCUTANEOUS
  Administered 2018-08-04: 04:00:00 2 [IU] via SUBCUTANEOUS

## 2018-07-31 NOTE — Progress Notes (Signed)
CSW received consult for patient due to him having homeless issues. CSW reviewed chart, patient is COVID negative a this time and is continuing to receive medical treatment to address his needs. CSW provided Janett Billow, RN with resource lists that patient can utilize when able for transistional housing. CSW tubed items to the unit and notified RN.  CSW signing off.  Madilyn Fireman, MSW, [place] Clinical Social Worker Emergency Department [employer] [phone]

## 2018-07-31 NOTE — Progress Notes (Signed)
[facility] Progress Note Patient Name: Amerigo Mcglory DOB: [birth year] MRN: [id number]   Date of Service  07/31/2018  HPI/Events of Note  Patient is on Azithromycin for CAP. QT is prolonged at 0.53.  eICU Interventions  Discontinue Azithromycin, Doxycycline 100 mg iv Q 12 hours x 5 days        Frederik Pear 07/31/2018, 8:04 PM

## 2018-07-31 NOTE — Progress Notes (Signed)
Hypoglycemic Event  CBG: 64  Treatment: D50 50 mL (25 gm)  Symptoms: None2  Follow-up CBG: Time:2353 CBG Result:121  Possible Reasons for Event: Unknown     Trevor Williams J Bao Coreas

## 2018-07-31 NOTE — Progress Notes (Signed)
Performed 12 lead EKG, showing prolonged QTc (0.56).  Relayed to CCM NP as well as pharmacy.  Per pharmacist, azithromycin can prolong QTc.  Pharmacy states will speak w/ CCM about d/c'ing med.

## 2018-07-31 NOTE — Progress Notes (Signed)
eLink Physician-Brief Progress Note Patient Name: Trevor Williams DOB: 1960-07-31 MRN: [id number]   Date of Service  07/31/2018  HPI/Events of Note  Hyperglycemia  eICU Interventions  Adult phase 1 hyperglycemia protocol orders entered        Frederik Pear 07/31/2018, 12:22 AM

## 2018-07-31 NOTE — Progress Notes (Signed)
NAME:  Trevor Williams, MRN:  [id number], DOB:  1961-01-01, LOS: 2 ADMISSION DATE:  07/28/2018, CONSULTATION DATE:  07/30/2018 REFERRING MD:  Dr. Florene Glen, [facility], CHIEF COMPLAINT:  Short of breath   Brief History   58 yo male smoker with hx of ETOH presented with dyspnea, wheezing and hypoxia.  Treated for COPD exacerbation.  Developed alcohol withdrawal, respiratory failure, and Rt lower lung infiltrate.  Transferred to ICU 5/12.  Past Medical History  ETOH, COPD/emphysema, Skin cancer  Significant Hospital Events   5/10 Admit 5/12 transfer to ICU  Consults:    Procedures:  ETT 5/12 >>  Significant Diagnostic Tests:  CT angio chest 5/10 >> moderate centrilobular emphysema, GGO RUL and LLL, bronchial thickening  Micro Data:  COVID (in house) 5/10 >> negative RVP 5/10 >> negative Blood 5/10 >> COVID (send out) 5/10 >> Legionella Ag 5/11 >> Pneumococcal Ag 5/11 >> Sputum 5/12 >>   Antimicrobials:  Rocephin 5/10 >> Zithromax 5/10 >> Vancomycin 5/12 >>   Interim history/subjective:  Weaning FiO2 currently down to 50% PEEP is been decreased to 8  Objective   Blood pressure 108/78, pulse 87, temperature 98.7 F (37.1 C), temperature source Oral, resp. rate 20, height 5\' 7"  (1.702 m), weight 62.5 kg, SpO2 100 %.    Vent Mode: PRVC FiO2 (%):  [50 %-90 %] 50 % Set Rate:  [20 bmp] 20 bmp Vt Set:  [530 mL] 530 mL PEEP:  [10 cmH20] 10 cmH20 Plateau Pressure:  [20 cmH20-25 cmH20] 25 cmH20   Intake/Output Summary (Last 24 hours) at 07/31/2018 1139 Last data filed at 07/31/2018 1100 Gross per 24 hour  Intake 5670.52 ml  Output 450 ml  Net 5220.52 ml   Filed Weights   07/30/18 0448 07/30/18 0531 07/31/18 0312  Weight: 93.5 kg 57.5 kg 62.5 kg    Examination:  General: Disheveled cachectic male sedated on full mechanical ventilatory support HEENT: Endotracheal tube is in place gastric tube is in place Neuro: Currently sedated CV: Heart sounds are regular PULM:  even/non-labored, lungs bilaterally diminished right base ZH:GDJM, non-tender, bsx4 active  Extremities: warm/dry, 1+ edema  Skin: no rashes or lesions    07/31/2018 chest x-ray reviewed shows improved aeration right lung, endotracheal tube in proper position    Resolved Hospital Problem list     Assessment & Plan:   Acute hypoxic respiratory failure from Rt lower Lobar PNA and COPD exacerbation. Tobacco abuse. Hx of COPD with centrilobular emphysema. Plan -Wean FiO2 as tolerated -Vancomycin Rocephin and Zithromax -Chest x-ray -Bronchodilators -No steroids at this time  Acute metabolic encephalopathy. Alcohol withdrawal. Plan -RA SS score -1 -Precedex, fentanyl, PRN Versed, may add low-dose Klonopin -Thiamine folic acid  Severe protein calorie malnutrition. Plan -Tube feeds -Monitor for refeeding syndrome  Anemia of critical illness and chronic disease. Thrombocytopenia in setting of ETOH. Recent Labs    07/30/18 1017 07/31/18 0412  HGB 13.3 9.0*  plantlet 34  Plan Monitor CBC No heparin   Best practice:  Diet: tube feeds DVT prophylaxis: lovenox GI prophylaxis: protonix Mobility: bed rest Code Status: full code Disposition: ICU  Labs   CBC: Recent Labs  Lab 07/28/18 2012 07/29/18 0127 07/30/18 0607 07/30/18 1017 07/31/18 0412  WBC 4.5 2.0* 10.7*  --  6.9  NEUTROABS 1.5* 1.4*  --   --   --   HGB 11.2* 11.2* 10.6* 13.3 9.0*  HCT 33.6* 33.6* 31.8* 39.0 27.3*  MCV 110.5* 110.2* 111.2*  --  110.5*  PLT 47* 46* 42*  --  34*    Basic Metabolic Panel: Recent Labs  Lab 07/28/18 2012 07/29/18 0127 07/30/18 0607 07/30/18 0838 07/30/18 1017 07/30/18 1759 07/31/18 0412  NA 144 142 136  --  139  --  141  K 3.9 4.0 3.5  --  4.0  --  3.6  CL 108 103 104  --   --   --  109  CO2 20* 22 21*  --   --   --  23  GLUCOSE 98 126* 210*  --   --   --  71  BUN 11 8 13   --   --   --  18  CREATININE 0.59* 0.72 0.87  --   --   --  0.79  CALCIUM 8.8*  8.2* 8.3*  --   --   --  7.9*  MG  --   --  1.4* 1.5*  --  2.1 2.1  PHOS  --   --   --  5.0*  --  3.5 2.4*   GFR: Estimated Creatinine Clearance: 90.1 mL/min (by C-G formula based on SCr of 0.79 mg/dL). Recent Labs  Lab 07/28/18 2012 07/28/18 2035 07/28/18 2301 07/29/18 0127 07/30/18 0607 07/31/18 0412  WBC 4.5  --   --  2.0* 10.7* 6.9  LATICACIDVEN 1.9 2.0* 2.1*  --   --   --     Liver Function Tests: Recent Labs  Lab 07/28/18 2012 07/29/18 0127 07/30/18 0607 07/31/18 0412  AST 197* 175* 152* 71*  ALT 74* 75* 68* 53*  ALKPHOS 107 109 96 69  BILITOT 0.7 0.7 1.1 0.8  PROT 7.3 7.6 7.0 6.1*  ALBUMIN 3.6 3.6 3.5 2.9*   No results for input(s): LIPASE, AMYLASE in the last 168 hours. No results for input(s): AMMONIA in the last 168 hours.  ABG    Component Value Date/Time   PHART 7.373 07/31/2018 0310   PCO2ART 43.9 07/31/2018 0310   PO2ART 75.8 (L) 07/31/2018 0310   HCO3 24.9 07/31/2018 0310   TCO2 23 07/30/2018 1017   ACIDBASEDEF 4.0 (H) 07/30/2018 1017   O2SAT 94.0 07/31/2018 0310     Coagulation Profile: No results for input(s): INR, PROTIME in the last 168 hours.  Cardiac Enzymes: Recent Labs  Lab 07/28/18 2012 07/29/18 0127 07/29/18 0748 07/29/18 1418  TROPONINI <0.03 <0.03 <0.03 0.03*    HbA1C: No results found for: HGBA1C  CBG: Recent Labs  Lab 07/30/18 2003 07/30/18 2358 07/31/18 0453 07/31/18 0806 07/31/18 1127  GLUCAP 125* 197* 82 108* 149*    Critical care time: 30 min     Richardson Landry Karlin Binion ACNP [facility] Pager [phone] till 1 pm If no answer page [phone] 07/31/2018, 11:39 AM

## 2018-08-01 ENCOUNTER — Inpatient Hospital Stay (HOSPITAL_COMMUNITY): Payer: Medicaid Other

## 2018-08-01 LAB — CBC
HCT: 28.1 % — ABNORMAL LOW (ref 39.0–52.0)
Hemoglobin: 9.2 g/dL — ABNORMAL LOW (ref 13.0–17.0)
MCH: 37.2 pg — ABNORMAL HIGH (ref 26.0–34.0)
MCHC: 32.7 g/dL (ref 30.0–36.0)
MCV: 113.8 fL — ABNORMAL HIGH (ref 80.0–100.0)
Platelets: 32 10*3/uL — ABNORMAL LOW (ref 150–400)
RBC: 2.47 MIL/uL — ABNORMAL LOW (ref 4.22–5.81)
RDW: 12.8 % (ref 11.5–15.5)
WBC: 5.8 10*3/uL (ref 4.0–10.5)
nRBC: 0.3 % — ABNORMAL HIGH (ref 0.0–0.2)

## 2018-08-01 LAB — CULTURE, RESPIRATORY W GRAM STAIN
Culture: NORMAL
Gram Stain: NONE SEEN

## 2018-08-01 LAB — BASIC METABOLIC PANEL
Anion gap: 9 (ref 5–15)
BUN: 15 mg/dL (ref 6–20)
CO2: 22 mmol/L (ref 22–32)
Calcium: 8 mg/dL — ABNORMAL LOW (ref 8.9–10.3)
Chloride: 109 mmol/L (ref 98–111)
Creatinine, Ser: 0.74 mg/dL (ref 0.61–1.24)
GFR calc Af Amer: >60 mL/min (ref >60)
GFR calc non Af Amer: >60 mL/min (ref >60)
Glucose, Bld: 163 mg/dL — ABNORMAL HIGH (ref 70–99)
Potassium: 4.1 mmol/L (ref 3.5–5.1)
Sodium: 140 mmol/L (ref 135–145)

## 2018-08-01 LAB — GLUCOSE, CAPILLARY
Glucose-Capillary: 122 mg/dL — ABNORMAL HIGH (ref 70–99)
Glucose-Capillary: 124 mg/dL — ABNORMAL HIGH (ref 70–99)
Glucose-Capillary: 125 mg/dL — ABNORMAL HIGH (ref 70–99)
Glucose-Capillary: 133 mg/dL — ABNORMAL HIGH (ref 70–99)
Glucose-Capillary: 147 mg/dL — ABNORMAL HIGH (ref 70–99)
Glucose-Capillary: 83 mg/dL (ref 70–99)

## 2018-08-01 MED ORDER — METHYLPREDNISOLONE 4 MG PO TBPK
4.0000 mg | ORAL_TABLET | Freq: Three times a day (TID) | ORAL | Status: DC
  Administered 2018-08-02: 4 mg via ORAL

## 2018-08-01 MED ORDER — CHLORHEXIDINE GLUCONATE 0.12 % MT SOLN
OROMUCOSAL | Status: AC
  Filled 2018-08-01: qty 15

## 2018-08-01 MED ORDER — DIPHENHYDRAMINE HCL 50 MG/ML IJ SOLN
12.5000 mg | Freq: Four times a day (QID) | INTRAMUSCULAR | Status: DC | PRN
  Administered 2018-08-02: 04:00:00 12.5 mg via INTRAVENOUS
  Filled 2018-08-01: qty 1

## 2018-08-01 MED ORDER — METHYLPREDNISOLONE 4 MG PO TBPK
20.0000 mg | ORAL_TABLET | Freq: Every evening | ORAL | Status: AC
  Administered 2018-08-01: 22:00:00 20 mg via ORAL

## 2018-08-01 MED ORDER — METHYLPREDNISOLONE 4 MG PO TBPK
8.0000 mg | ORAL_TABLET | Freq: Every morning | ORAL | Status: DC
  Filled 2018-08-01: qty 21

## 2018-08-01 MED ORDER — METHYLPREDNISOLONE 4 MG PO TBPK: 4.0000 mg | ORAL_TABLET | Freq: Four times a day (QID) | ORAL | Status: DC

## 2018-08-01 MED ORDER — SODIUM CHLORIDE 0.9 % IV SOLN
100.0000 mg | Freq: Two times a day (BID) | INTRAVENOUS | Status: DC
  Administered 2018-08-01 (×2): 100 mg via INTRAVENOUS
  Filled 2018-08-01 (×5): qty 100

## 2018-08-01 MED ORDER — FAMOTIDINE IN NACL 20-0.9 MG/50ML-% IV SOLN
20.0000 mg | Freq: Two times a day (BID) | INTRAVENOUS | Status: DC
  Administered 2018-08-01: 22:00:00 20 mg via INTRAVENOUS
  Filled 2018-08-01 (×2): qty 50

## 2018-08-01 MED ORDER — METHYLPREDNISOLONE 4 MG PO TBPK: 4.0000 mg | ORAL_TABLET | ORAL | Status: DC

## 2018-08-01 MED ORDER — METHYLPREDNISOLONE 4 MG PO TBPK: 8.0000 mg | ORAL_TABLET | Freq: Every evening | ORAL | Status: DC

## 2018-08-01 MED ORDER — CLONAZEPAM 1 MG PO TABS
1.0000 mg | ORAL_TABLET | Freq: Two times a day (BID) | ORAL | Status: DC
  Administered 2018-08-01 (×2): 1 mg
  Filled 2018-08-01 (×2): qty 1

## 2018-08-01 MED FILL — Fentanyl Citrate Preservative Free (PF) Inj 2500 MCG/50ML: INTRAMUSCULAR | Qty: 2500 | Status: AC

## 2018-08-01 MED FILL — Sodium Chloride IV Soln 0.9%: INTRAVENOUS | Qty: 200 | Status: AC

## 2018-08-01 NOTE — Progress Notes (Signed)
NAME:  Trevor Williams, MRN:  [id number], DOB:  1960-05-23, LOS: 3 ADMISSION DATE:  07/28/2018, CONSULTATION DATE:  07/30/2018 REFERRING MD:  Dr. Florene Glen, [facility], CHIEF COMPLAINT:  Short of breath   Brief History   58 yo male smoker with hx of ETOH presented with dyspnea, wheezing and hypoxia.  Treated for COPD exacerbation.  Developed alcohol withdrawal, respiratory failure, and Rt lower lung infiltrate.  Transferred to ICU 5/12.  Past Medical History  ETOH, COPD/emphysema, Skin cancer  Significant Hospital Events   5/10 Admit 5/12 transfer to ICU  Consults:    Procedures:  ETT 5/12 >>  Significant Diagnostic Tests:  CT angio chest 5/10 >> moderate centrilobular emphysema, GGO RUL and LLL, bronchial thickening  Micro Data:  COVID (in house) 5/10 >> negative RVP 5/10 >> negative Blood 5/10 >> COVID (send out) 5/10 >> negative Legionella Ag 5/11 >> not drawn Pneumococcal Ag 5/11 >> not drawn Sputum 5/12 >> normal flora  Antimicrobials:  Rocephin 5/10 >> 08/03/2018 plan stop date Zithromax 5/10 >> 07/31/2018 Vancomycin 5/12 >> 08/01/1998  Interim history/subjective:  Weaning FiO2 currently down to 50% PEEP is been decreased to 8  Objective   Blood pressure 120/86, pulse 80, temperature 97.9 F (36.6 C), temperature source Oral, resp. rate 20, height 5\' 7"  (1.702 m), weight 62.6 kg, SpO2 100 %.    Vent Mode: PRVC FiO2 (%):  [40 %-50 %] 40 % Set Rate:  [20 bmp] 20 bmp Vt Set:  [530 mL] 530 mL PEEP:  [8 cmH20] 8 cmH20 Plateau Pressure:  [19 cmH20-24 cmH20] 24 cmH20   Intake/Output Summary (Last 24 hours) at 08/01/2018 0837 Last data filed at 08/01/2018 0600 Gross per 24 hour  Intake 4670.82 ml  Output 875 ml  Net 3795.82 ml   Filed Weights   07/30/18 0531 07/31/18 0312 08/01/18 0443  Weight: 57.5 kg 62.5 kg 62.6 kg    Examination:  General: Disheveled emaciated male sedated and on full mechanical ventilatory support HEENT: Endotracheal tube is in place, gastric  tube is in place for tube feedings.  No JVD or lymphadenopathy is appreciated Neuro:  CV: Heart sounds are regular regular rate and rhythm sinus rhythm of 86 with a blood pressure 128/84 PULM: Coarse rhonchi bilaterally EG:BTDV, non-tender, bsx4 active, tube feeding at goal  extremities: warm/dry, negative edema  Skin: Skin is warm.  Noted to have frontal rash on lower abdomen related to, vancomycin has been discontinued continue to monitor rash      07/31/2018 chest x-ray reviewed shows improved aeration right lung, endotracheal tube in proper position 08/01/2018 chest x-ray reviewed shows worsening opacity right I suspect remains about the same just depends on how the chest x-ray shot.   Resolved Hospital Problem list     Assessment & Plan:   Acute hypoxic respiratory failure from Rt lower Lobar PNA and COPD exacerbation. Tobacco abuse. Hx of COPD with centrilobular emphysema. Rash of lower abdomen and legs affected from vancomycin which was discontinued on 08/01/2010 Plan Wean FiO2 as tolerated We will discontinue vancomycin for truncal rash and Zithromax was discontinued due to prolonged QT 0 Continue to monitor chest x-ray right opacity remains Continue bronchodilators Not currently on steroids at this time. Continue to monitor rash  Acute metabolic encephalopathy. Alcohol withdrawal. Plan RA SS score 0-1 Wean sedation as able Continue thiamine and folic acid 7/61/6073 added Klonopin 1 mg twice daily  Severe protein calorie malnutrition. Plan Continue tube feedings Monitor for refeeding syndrome  Anemia of critical illness and  chronic disease. Thrombocytopenia in setting of ETOH. Recent Labs    07/31/18 0412 08/01/18 0324  HGB 9.0* 9.2*  plantlet 34->32  Plan Continue to monitor CBC Transfuse per protocol  Best practice:  Diet: tube feeds DVT prophylaxis: lovenox GI prophylaxis: protonix Mobility: bed rest Code Status: full code Disposition: ICU   Labs   CBC: Recent Labs  Lab 07/28/18 2012 07/29/18 0127 07/30/18 0607 07/30/18 1017 07/31/18 0412 08/01/18 0324  WBC 4.5 2.0* 10.7*  --  6.9 5.8  NEUTROABS 1.5* 1.4*  --   --   --   --   HGB 11.2* 11.2* 10.6* 13.3 9.0* 9.2*  HCT 33.6* 33.6* 31.8* 39.0 27.3* 28.1*  MCV 110.5* 110.2* 111.2*  --  110.5* 113.8*  PLT 47* 46* 42*  --  34* 32*    Basic Metabolic Panel: Recent Labs  Lab 07/28/18 2012 07/29/18 0127 07/30/18 0607 07/30/18 0838 07/30/18 1017 07/30/18 1759 07/31/18 0412 07/31/18 1634 08/01/18 0324  NA 144 142 136  --  139  --  141  --  140  K 3.9 4.0 3.5  --  4.0  --  3.6  --  4.1  CL 108 103 104  --   --   --  109  --  109  CO2 20* 22 21*  --   --   --  23  --  22  GLUCOSE 98 126* 210*  --   --   --  71  --  163*  BUN 11 8 13   --   --   --  18  --  15  CREATININE 0.59* 0.72 0.87  --   --   --  0.79  --  0.74  CALCIUM 8.8* 8.2* 8.3*  --   --   --  7.9*  --  8.0*  MG  --   --  1.4* 1.5*  --  2.1 2.1 2.0  --   PHOS  --   --   --  5.0*  --  3.5 2.4* 2.8  --    GFR: Estimated Creatinine Clearance: 90.2 mL/min (by C-G formula based on SCr of 0.74 mg/dL). Recent Labs  Lab 07/28/18 2012 07/28/18 2035 07/28/18 2301 07/29/18 0127 07/30/18 0607 07/31/18 0412 08/01/18 0324  WBC 4.5  --   --  2.0* 10.7* 6.9 5.8  LATICACIDVEN 1.9 2.0* 2.1*  --   --   --   --     Liver Function Tests: Recent Labs  Lab 07/28/18 2012 07/29/18 0127 07/30/18 0607 07/31/18 0412  AST 197* 175* 152* 71*  ALT 74* 75* 68* 53*  ALKPHOS 107 109 96 69  BILITOT 0.7 0.7 1.1 0.8  PROT 7.3 7.6 7.0 6.1*  ALBUMIN 3.6 3.6 3.5 2.9*   No results for input(s): LIPASE, AMYLASE in the last 168 hours. No results for input(s): AMMONIA in the last 168 hours.  ABG    Component Value Date/Time   PHART 7.373 07/31/2018 0310   PCO2ART 43.9 07/31/2018 0310   PO2ART 75.8 (L) 07/31/2018 0310   HCO3 24.9 07/31/2018 0310   TCO2 23 07/30/2018 1017   ACIDBASEDEF 4.0 (H) 07/30/2018 1017   O2SAT  94.0 07/31/2018 0310     Coagulation Profile: No results for input(s): INR, PROTIME in the last 168 hours.  Cardiac Enzymes: Recent Labs  Lab 07/28/18 2012 07/29/18 0127 07/29/18 0748 07/29/18 1418  TROPONINI <0.03 <0.03 <0.03 0.03*    HbA1C: No results found for: HGBA1C  CBG: Recent Labs  Lab 07/31/18 1941 07/31/18 2325 07/31/18 2353 08/01/18 0348 08/01/18 0749  GLUCAP 156* 64* 121* 147* 124*    Critical care time: 30 min     Richardson Landry Minor ACNP [facility] Pager [phone] till 1 pm If no answer page [phone] 08/01/2018, 8:37 AM

## 2018-08-01 NOTE — Progress Notes (Signed)
[facility] Progress Note Patient Name: Trevor Williams DOB: 1960/05/12 MRN: [id number]   Date of Service  08/01/2018  HPI/Events of Note  Erythematous rash on lower trunk and extremities is progressing despite Vancomycin d/c  eICU Interventions  Benadryl/pepcid/medrol dose pack for possible allergic reaction. Seems less likely than [place] Man syndrome based on the distribution of the lesions.        Kerry Kass Anis Degidio 08/01/2018, 8:31 PM

## 2018-08-01 NOTE — Progress Notes (Signed)
RT attempted to suction patient at this time and could not advance the catheter. RT tried to lavage patient with no success. As RT pulled catheter back there was a large plug attached to the end of the suction catheter. RT pulled out mucous plug that was roughly the size of the ETT around and about 3 inches long and was brown and yellow as well as very tacky. Pt tolerated well, and is resting comfortably at this time. RT to continue to monitor as needed.

## 2018-08-01 NOTE — Progress Notes (Signed)
Pharmacy is unable to confirm the medications the patient was taking at home. All options have been exhausted and a resolution to the situation is not expected.   Where possible, their outpatient pharmacy(s) have been contacted for the last time prescriptions were filled and that information has been added to each medication in an Order Note (highlighted yellow below the medication).  Please contact pharmacy if further assistance is needed.   Romeo Rabon, PharmD. Mobile: [phone]. 08/01/2018,8:27 AM.

## 2018-08-02 ENCOUNTER — Inpatient Hospital Stay (HOSPITAL_COMMUNITY): Payer: Medicaid Other

## 2018-08-02 LAB — CULTURE, BLOOD (ROUTINE X 2)
Culture: NO GROWTH
Culture: NO GROWTH
Special Requests: ADEQUATE
Special Requests: ADEQUATE

## 2018-08-02 LAB — BASIC METABOLIC PANEL WITH GFR
Anion gap: 5 (ref 5–15)
BUN: 11 mg/dL (ref 6–20)
CO2: 24 mmol/L (ref 22–32)
Calcium: 7.8 mg/dL — ABNORMAL LOW (ref 8.9–10.3)
Chloride: 109 mmol/L (ref 98–111)
Creatinine, Ser: 0.69 mg/dL (ref 0.61–1.24)
GFR calc Af Amer: >60 mL/min (ref >60)
GFR calc non Af Amer: >60 mL/min (ref >60)
Glucose, Bld: 133 mg/dL — ABNORMAL HIGH (ref 70–99)
Potassium: 4.2 mmol/L (ref 3.5–5.1)
Sodium: 138 mmol/L (ref 135–145)

## 2018-08-02 LAB — GLUCOSE, CAPILLARY
Glucose-Capillary: 127 mg/dL — ABNORMAL HIGH (ref 70–99)
Glucose-Capillary: 165 mg/dL — ABNORMAL HIGH (ref 70–99)
Glucose-Capillary: 138 mg/dL — ABNORMAL HIGH (ref 70–99)
Glucose-Capillary: 169 mg/dL — ABNORMAL HIGH (ref 70–99)
Glucose-Capillary: 126 mg/dL — ABNORMAL HIGH (ref 70–99)
Glucose-Capillary: 187 mg/dL — ABNORMAL HIGH (ref 70–99)

## 2018-08-02 LAB — CBC WITH DIFFERENTIAL/PLATELET
Abs Immature Granulocytes: 0.02 10*3/uL (ref 0.00–0.07)
Basophils Absolute: 0 10*3/uL (ref 0.0–0.1)
Basophils Relative: 0 %
Eosinophils Absolute: 0.2 10*3/uL (ref 0.0–0.5)
Eosinophils Relative: 3 %
HCT: 29.5 % — ABNORMAL LOW (ref 39.0–52.0)
Hemoglobin: 9.7 g/dL — ABNORMAL LOW (ref 13.0–17.0)
Immature Granulocytes: 0 %
Lymphocytes Relative: 7 %
Lymphs Abs: 0.5 10*3/uL — ABNORMAL LOW (ref 0.7–4.0)
MCH: 37 pg — ABNORMAL HIGH (ref 26.0–34.0)
MCHC: 32.9 g/dL (ref 30.0–36.0)
MCV: 112.6 fL — ABNORMAL HIGH (ref 80.0–100.0)
Monocytes Absolute: 0.6 10*3/uL (ref 0.1–1.0)
Monocytes Relative: 10 %
Neutro Abs: 5 10*3/uL (ref 1.7–7.7)
Neutrophils Relative %: 80 %
Platelets: 42 10*3/uL — ABNORMAL LOW (ref 150–400)
RBC: 2.62 MIL/uL — ABNORMAL LOW (ref 4.22–5.81)
RDW: 12.9 % (ref 11.5–15.5)
WBC: 6.3 10*3/uL (ref 4.0–10.5)
nRBC: 0 % (ref 0.0–0.2)

## 2018-08-02 LAB — PHOSPHORUS: Phosphorus: 2.9 mg/dL (ref 2.5–4.6)

## 2018-08-02 LAB — MAGNESIUM: Magnesium: 1.8 mg/dL (ref 1.7–2.4)

## 2018-08-02 MED ORDER — FAMOTIDINE 40 MG/5ML PO SUSR
20.0000 mg | Freq: Two times a day (BID) | ORAL | Status: DC
  Administered 2018-08-02 – 2018-08-03 (×3): 20 mg
  Filled 2018-08-02 (×4): qty 2.5

## 2018-08-02 MED ORDER — LEVOFLOXACIN 25 MG/ML PO SOLN
750.0000 mg | Freq: Every day | ORAL | Status: AC
  Administered 2018-08-02 – 2018-08-03 (×2): 750 mg
  Filled 2018-08-02 (×2): qty 30

## 2018-08-02 MED ORDER — METHYLPREDNISOLONE SODIUM SUCC 125 MG IJ SOLR
40.0000 mg | Freq: Four times a day (QID) | INTRAMUSCULAR | Status: AC
  Administered 2018-08-02 – 2018-08-05 (×15): 40 mg via INTRAVENOUS
  Filled 2018-08-02 (×15): qty 2

## 2018-08-02 MED ORDER — PRO-STAT SUGAR FREE PO LIQD
30.0000 mL | Freq: Two times a day (BID) | ORAL | Status: DC
  Administered 2018-08-02: 30 mL
  Filled 2018-08-02: qty 30

## 2018-08-02 MED ORDER — GUAIFENESIN 100 MG/5ML PO SOLN
5.0000 mL | Freq: Two times a day (BID) | ORAL | Status: DC
  Administered 2018-08-02 – 2018-08-03 (×4): 100 mg
  Filled 2018-08-02 (×4): qty 5

## 2018-08-02 MED ORDER — FOLIC ACID 1 MG PO TABS
1.0000 mg | ORAL_TABLET | Freq: Every day | ORAL | Status: DC
  Administered 2018-08-02 – 2018-08-03 (×3): 1 mg
  Filled 2018-08-02 (×2): qty 1

## 2018-08-02 MED ORDER — DIPHENHYDRAMINE HCL 12.5 MG/5ML PO ELIX
25.0000 mg | ORAL_SOLUTION | Freq: Four times a day (QID) | ORAL | Status: AC
  Administered 2018-08-02 – 2018-08-03 (×6): 25 mg
  Filled 2018-08-02 (×6): qty 10

## 2018-08-02 MED ORDER — VITAMIN B-1 100 MG PO TABS
100.0000 mg | ORAL_TABLET | Freq: Every day | ORAL | Status: DC
  Administered 2018-08-02 – 2018-08-03 (×2): 100 mg
  Filled 2018-08-02 (×2): qty 1

## 2018-08-02 MED ORDER — CLONAZEPAM 1 MG PO TABS
1.0000 mg | ORAL_TABLET | Freq: Three times a day (TID) | ORAL | Status: DC
  Administered 2018-08-02 – 2018-08-03 (×5): 1 mg
  Filled 2018-08-02 (×5): qty 1

## 2018-08-02 NOTE — Progress Notes (Signed)
Nutrition Follow-up  DOCUMENTATION CODES:   Not applicable  INTERVENTION:   Osmolite 1.2 at 55 ml/h  Increase Pro-stat to 30 ml BID  Provides 1784 kcal, 103 gm protein, 1082 ml free water daily  NUTRITION DIAGNOSIS:   Inadequate oral intake related to inability to eat as evidenced by NPO status.  Ongoing  GOAL:   Patient will meet greater than or equal to 90% of their needs  Met with TF  MONITOR:   Vent status, TF tolerance, Labs   ASSESSMENT:   58 yo male with PMH of COPD/emphysema, skin cancer, smoker, alcohol abuse who was admitted with COPD exacerbation. Developed alcohol withdrawal and respiratory failure requiring intubation and transfer to the ICU on 5/12.  Patient had a mucus plug last night. He remains intubated on ventilator support MV: 9.6 L/min Temp (24hrs), Avg:98.7 F (37.1 C), Min:98.2 F (36.8 C), Max:99.2 F (37.3 C)   Currently receiving Osmolite 1.2 at 55 ml/h via OGT with Pro-stat 30 ml once daily to provide 1684 kcal, 88 gm protein, 1082 ml free water daily.  Labs reviewed. CBG's: [phone]  Medications reviewed and include folic acid, Novolog, Solu-medrol, MVI, thiamine.  Weight up 11.3 kg since 5/12. I/O +14 L since admission.  Diet Order:   Diet Order            Diet NPO time specified  Diet effective now              EDUCATION NEEDS:   No education needs have been identified at this time  Skin:  Skin Assessment: (rash to abdomen, thigh, foot, leg)  Last BM:  5/14 (type 7)  Height:   Ht Readings from Last 1 Encounters:  07/29/18 '5\' 7"'$  (1.702 m)    Weight:   Wt Readings from Last 1 Encounters:  08/02/18 68.8 kg   07/30/18 57.5 kg    Ideal Body Weight:  67.3 kg  BMI:  Body mass index is 23.76 kg/m.  Estimated Nutritional Needs:   Kcal:  5038  Protein:  90-100 gm  Fluid:  1.8 L    Molli Barrows, RD, LDN, [place] Pager [phone] After Hours Pager [phone]

## 2018-08-02 NOTE — Progress Notes (Signed)
NAME:  Trevor Williams, MRN:  [id number], DOB:  08-May-1960, LOS: 4 ADMISSION DATE:  07/28/2018, CONSULTATION DATE:  07/30/2018 REFERRING MD:  Dr. Florene Glen, [facility], CHIEF COMPLAINT:  Short of breath   Brief History   58 yo male smoker with hx of ETOH presented with dyspnea, wheezing and hypoxia.  Treated for COPD exacerbation.  Developed alcohol withdrawal, respiratory failure, and Rt lower lung infiltrate.  Transferred to ICU 5/12.  Past Medical History  ETOH, COPD/emphysema, Skin cancer  Significant Hospital Events   5/10 Admit 5/12 transfer to ICU 5/14 Rash  Consults:    Procedures:  ETT 5/12 >>  Significant Diagnostic Tests:  CT angio chest 5/10 >> moderate centrilobular emphysema, GGO RUL and LLL, bronchial thickening  Micro Data:  COVID (in house) 5/10 >> negative RVP 5/10 >> negative Blood 5/10 >> COVID (send out) 5/11 >> negative Sputum 5/12 >> normal flora  Antimicrobials:  Rocephin 5/10 >> 5/16 Zithromax 5/10 >> 5/13 Vancomycin 5/12 >> 5/14 Doxycycline 5/13 >> 5/15 Levaquin 5/16 >>  Interim history/subjective:  Agitated with WUA.  Had mucus plug last night.  Objective   Blood pressure 124/83, pulse 90, temperature 98.6 F (37 C), temperature source Oral, resp. rate 20, height 5\' 7"  (1.702 m), weight 68.8 kg, SpO2 100 %.    Vent Mode: PRVC FiO2 (%):  [30 %-40 %] 30 % Set Rate:  [20 bmp] 20 bmp Vt Set:  [530 mL] 530 mL PEEP:  [5 cmH20] 5 cmH20 Pressure Support:  [10 cmH20] 10 cmH20 Plateau Pressure:  [15 cmH20-33 cmH20] 18 cmH20   Intake/Output Summary (Last 24 hours) at 08/02/2018 0831 Last data filed at 08/02/2018 0600 Gross per 24 hour  Intake 4391.5 ml  Output 1170 ml  Net 3221.5 ml   Filed Weights   07/31/18 0312 08/01/18 0443 08/02/18 0411  Weight: 62.5 kg 62.6 kg 68.8 kg    Examination:  General - cachectic Eyes - pupils reactive ENT - ETT in place Cardiac - regular rate/rhythm, no murmur Chest - b/l crackles Abdomen - soft, non tender, +  bowel sounds Extremities - no cyanosis, clubbing, or edema Skin - erythematous rash on trunk, abdomen, thighs Neuro - RASS +1  CXR (reviewed by me) - Rt > Lt ASD  Resolved Hospital Problem list     Assessment & Plan:   Acute hypoxic respiratory failure from Rt lower Lobar PNA and COPD exacerbation. Tobacco abuse. Hx of COPD with centrilobular emphysema. Plan - pressure support - not ready for extubation - goal SpO2 90 to 95% - day 6/7 of ABx - scheduled BDs - nicotine patch  Rash. Discussion: Doesn't appear to be redman syndrome.  ?if from doxycycline or rocephin. Plan - continue solumedrol, benadryl - d/c doxycycline, rocephin  Prolonged QTc. Plan - repeat ECG  Acute metabolic encephalopathy. Alcohol withdrawal. Plan - RASS goal 0 to -1 - use versed, fentanyl gtt - wean off precedex - increase klonopin to 1 mg C5Y - thiamine, folic acid  Severe protein calorie malnutrition. Plan - tube feeds  Anemia of critical illness and chronic disease. Thrombocytopenia in setting of ETOH. Plan - f/u CBC  Best practice:  Diet: tube feeds DVT prophylaxis: SCDs GI prophylaxis: pepcid Mobility: bed rest Code Status: full code Disposition: ICU  Labs    CMP Latest Ref Rng & Units 08/02/2018 08/01/2018 07/31/2018  Glucose 70 - 99 mg/dL 133(H) 163(H) 71  BUN 6 - 20 mg/dL 11 15 18   Creatinine 0.61 - 1.24 mg/dL 0.69 0.74 0.79  Sodium 135 - 145 mmol/L 138 140 141  Potassium 3.5 - 5.1 mmol/L 4.2 4.1 3.6  Chloride 98 - 111 mmol/L 109 109 109  CO2 22 - 32 mmol/L 24 22 23   Calcium 8.9 - 10.3 mg/dL 7.8(L) 8.0(L) 7.9(L)  Total Protein 6.5 - 8.1 g/dL - - 6.1(L)  Total Bilirubin 0.3 - 1.2 mg/dL - - 0.8  Alkaline Phos 38 - 126 U/L - - 69  AST 15 - 41 U/L - - 71(H)  ALT 0 - 44 U/L - - 53(H)   CBC Latest Ref Rng & Units 08/02/2018 08/01/2018 07/31/2018  WBC 4.0 - 10.5 K/uL 6.3 5.8 6.9  Hemoglobin 13.0 - 17.0 g/dL 9.7(L) 9.2(L) 9.0(L)  Hematocrit 39.0 - 52.0 % 29.5(L) 28.1(L)  27.3(L)  Platelets 150 - 400 K/uL 42(L) 32(L) 34(L)   ABG    Component Value Date/Time   PHART 7.373 07/31/2018 0310   PCO2ART 43.9 07/31/2018 0310   PO2ART 75.8 (L) 07/31/2018 0310   HCO3 24.9 07/31/2018 0310   TCO2 23 07/30/2018 1017   ACIDBASEDEF 4.0 (H) 07/30/2018 1017   O2SAT 94.0 07/31/2018 0310   CBG (last 3)  Recent Labs    08/01/18 2338 08/02/18 0335 08/02/18 0729  GLUCAP 125* 127* 165*   CC time 32 minutes  Chesley Mires, MD [place] 08/02/2018, 8:41 AM

## 2018-08-02 NOTE — Progress Notes (Signed)
Pharmacy Antibiotic Note  Trevor Williams is a 58 y.o. male admitted on 07/28/2018 with pneumonia.  Pharmacy has been consulted to change antibiotics to Levaquin.  QTc improves to 585ms and CCM aware and would like to proceed with a short course of Levaquin.  Renal function is stable, afebrile, WBC WNL.  Plan: Levaquin 750mg  PT Q24H x 2 more days per MD Monitor renal fxn, clinical progress, QTc   Height: 5\' 7"  (170.2 cm) Weight: 151 lb 10.8 oz (68.8 kg) IBW/kg (Calculated) : 66.1  Temp (24hrs), Avg:98.8 F (37.1 C), Min:98.2 F (36.8 C), Max:99.2 F (37.3 C)  Recent Labs  Lab 07/28/18 2012 07/28/18 2035 07/28/18 2301 07/29/18 0127 07/30/18 0607 07/31/18 0412 08/01/18 0324 08/02/18 0216  WBC 4.5  --   --  2.0* 10.7* 6.9 5.8 6.3  CREATININE 0.59*  --   --  0.72 0.87 0.79 0.74 0.69  LATICACIDVEN 1.9 2.0* 2.1*  --   --   --   --   --     Estimated Creatinine Clearance: 95.2 mL/min (by C-G formula based on SCr of 0.69 mg/dL).    Allergies  Allergen Reactions  . Doxycycline Rash    Rash noted after administration of vancomycin, doxycycline, and ceftriaxone. Unclear cause of rash.  . Ibuprofen Rash  . Rocephin [Ceftriaxone] Rash    Rash noted after administration of vancomycin, doxycycline, and ceftriaxone. Unclear cause of rash.  . Tylenol [Acetaminophen] Rash  . Vancomycin Rash    Rash noted after administration of vancomycin, doxycycline, and ceftriaxone. Unclear cause of rash.    5/12 vanc >>5/14 5/10 ctx >> 5/15 5/10 azithro >> 5/13 5/14 doxy >> 5/15 LVQ 5/15 >> 5/16  5/12 TA - nf 5/12 mrsa pcr - neg 5/10 BCx - NGTD 5/10 covid - neg 5/10 covid send out - neg 5/10 resp panel - neg  Makailah Slavick D. Mina Marble, PharmD, BCPS, [place] 08/02/2018, 9:17 AM

## 2018-08-03 ENCOUNTER — Inpatient Hospital Stay (HOSPITAL_COMMUNITY): Payer: Medicaid Other

## 2018-08-03 DIAGNOSIS — G9340 Encephalopathy, unspecified: Secondary | ICD-10-CM

## 2018-08-03 DIAGNOSIS — J189 Pneumonia, unspecified organism: Secondary | ICD-10-CM

## 2018-08-03 LAB — CBC
HCT: 30.3 % — ABNORMAL LOW (ref 39.0–52.0)
Hemoglobin: 10 g/dL — ABNORMAL LOW (ref 13.0–17.0)
MCH: 36.8 pg — ABNORMAL HIGH (ref 26.0–34.0)
MCHC: 33 g/dL (ref 30.0–36.0)
MCV: 111.4 fL — ABNORMAL HIGH (ref 80.0–100.0)
Platelets: 63 10*3/uL — ABNORMAL LOW (ref 150–400)
RBC: 2.72 MIL/uL — ABNORMAL LOW (ref 4.22–5.81)
RDW: 12.8 % (ref 11.5–15.5)
WBC: 6.5 10*3/uL (ref 4.0–10.5)
nRBC: 0 % (ref 0.0–0.2)

## 2018-08-03 LAB — BASIC METABOLIC PANEL
Anion gap: 7 (ref 5–15)
BUN: 15 mg/dL (ref 6–20)
CO2: 23 mmol/L (ref 22–32)
Calcium: 8.1 mg/dL — ABNORMAL LOW (ref 8.9–10.3)
Chloride: 107 mmol/L (ref 98–111)
Creatinine, Ser: 0.68 mg/dL (ref 0.61–1.24)
GFR calc Af Amer: >60 mL/min (ref >60)
GFR calc non Af Amer: >60 mL/min (ref >60)
Glucose, Bld: 227 mg/dL — ABNORMAL HIGH (ref 70–99)
Potassium: 4.1 mmol/L (ref 3.5–5.1)
Sodium: 137 mmol/L (ref 135–145)

## 2018-08-03 LAB — GLUCOSE, CAPILLARY
Glucose-Capillary: 118 mg/dL — ABNORMAL HIGH (ref 70–99)
Glucose-Capillary: 120 mg/dL — ABNORMAL HIGH (ref 70–99)
Glucose-Capillary: 131 mg/dL — ABNORMAL HIGH (ref 70–99)
Glucose-Capillary: 149 mg/dL — ABNORMAL HIGH (ref 70–99)
Glucose-Capillary: 208 mg/dL — ABNORMAL HIGH (ref 70–99)
Glucose-Capillary: 225 mg/dL — ABNORMAL HIGH (ref 70–99)

## 2018-08-03 MED ORDER — ORAL CARE MOUTH RINSE
15.0000 mL | Freq: Two times a day (BID) | OROMUCOSAL | Status: DC
  Administered 2018-08-04 – 2018-08-11 (×14): 15 mL via OROMUCOSAL

## 2018-08-03 MED ORDER — MAGNESIUM SULFATE 2 GM/50ML IV SOLN
2.0000 g | Freq: Once | INTRAVENOUS | Status: AC
  Administered 2018-08-03: 11:00:00 2 g via INTRAVENOUS
  Filled 2018-08-03: qty 50

## 2018-08-03 MED ORDER — FOLIC ACID 1 MG PO TABS
1.0000 mg | ORAL_TABLET | Freq: Every day | ORAL | Status: DC
  Administered 2018-08-04 – 2018-08-12 (×9): 1 mg via ORAL
  Filled 2018-08-03 (×9): qty 1

## 2018-08-03 MED ORDER — GUAIFENESIN 100 MG/5ML PO SOLN
5.0000 mL | Freq: Two times a day (BID) | ORAL | Status: DC
  Administered 2018-08-04 – 2018-08-10 (×14): 100 mg via ORAL
  Filled 2018-08-03 (×10): qty 5
  Filled 2018-08-03: qty 10
  Filled 2018-08-03 (×3): qty 5

## 2018-08-03 MED ORDER — FAMOTIDINE 20 MG PO TABS
20.0000 mg | ORAL_TABLET | Freq: Two times a day (BID) | ORAL | Status: DC
  Administered 2018-08-03 – 2018-08-12 (×18): 20 mg via ORAL
  Filled 2018-08-03 (×18): qty 1

## 2018-08-03 MED ORDER — ACETAMINOPHEN 325 MG PO TABS
650.0000 mg | ORAL_TABLET | Freq: Four times a day (QID) | ORAL | Status: DC | PRN
  Administered 2018-08-03 – 2018-08-09 (×6): 650 mg via ORAL
  Filled 2018-08-03 (×6): qty 2

## 2018-08-03 MED ORDER — PRO-STAT SUGAR FREE PO LIQD
30.0000 mL | Freq: Two times a day (BID) | ORAL | Status: DC
  Administered 2018-08-04 – 2018-08-05 (×3): 30 mL via ORAL
  Filled 2018-08-03 (×3): qty 30

## 2018-08-03 MED ORDER — CLONAZEPAM 1 MG PO TABS
1.0000 mg | ORAL_TABLET | Freq: Three times a day (TID) | ORAL | Status: DC
  Administered 2018-08-03 – 2018-08-06 (×8): 1 mg via ORAL
  Filled 2018-08-03 (×8): qty 1

## 2018-08-03 MED ORDER — VITAMIN B-1 100 MG PO TABS
100.0000 mg | ORAL_TABLET | Freq: Every day | ORAL | Status: DC
  Administered 2018-08-04 – 2018-08-12 (×10): 100 mg via ORAL
  Filled 2018-08-03 (×9): qty 1

## 2018-08-03 NOTE — Procedures (Signed)
Extubation Procedure Note  Patient Details:   Name: Trevor Williams DOB: March 05, 1961 MRN: [id number]   Airway Documentation:    Vent end date: 08/03/18 Vent end time: 1000   Evaluation  O2 sats: stable throughout Complications: No apparent complications Patient did tolerate procedure well. Bilateral Breath Sounds: Clear, Diminished   Yes   RT extubated patient per MD order with RN at bedside to 4L . Positive cuff leak noted. No stridor noted. RT will continue to monitor as needed.   Vernona Rieger 08/03/2018, 10:06 AM

## 2018-08-03 NOTE — Evaluation (Signed)
Clinical/Bedside Swallow Evaluation Patient Details  Name: Trevor Williams MRN: [id number] Date of Birth: 1960-10-06  Today's Date: 08/03/2018 Time: SLP Start Time (ACUTE ONLY): 1340 SLP Stop Time (ACUTE ONLY): 1410 SLP Time Calculation (min) (ACUTE ONLY): 30 min  Past Medical History:  Past Medical History:  Diagnosis Date  . Cancer ([place])    skin (left hand)  . Emphysema of lung [facility])    Past Surgical History: History reviewed. No pertinent surgical history. HPI:  Patient is a 58 y.o. male with PMH: ETOH abuse, active tobacco user, COPD, empysema, skin cancer, who was admitted for dyspnea, wheezing and hypoxia. He developed alcohol withdrawl, respiratory failure and right lower lung infiltrate and was intubated on 5/12 and extubated on 5/16 in AM.    Assessment / Plan / Recommendation Clinical Impression  Patient presents with a mild oropharyngel dysphagia characterized by overt coughing/throat clearing at beginning of evaluation with thin liquids, delayed mastication of hard solids, but with good laryngeal elevation and swallow initiation per palpation. When patient was taking smaller cup or straw sips, no overt s/s of aspiration was observed and per RN, patient took medications with sips of liquids and did not exhibit any overt s/s of difficulty. SLP to follow patient for diet toleration, upgrade of solid textures if appropriate, and determine if need for any objective swallow evaluations.   SLP Visit Diagnosis: Dysphagia, unspecified (R13.10)    Aspiration Risk  Mild aspiration risk    Diet Recommendation Dysphagia 3 (Mech soft);Thin liquid   Liquid Administration via: Straw;Cup Medication Administration: Whole meds with puree Supervision: Full supervision/cueing for compensatory strategies;Staff to assist with self feeding Compensations: Minimize environmental distractions;Slow rate;Small sips/bites Postural Changes: Seated upright at 90 degrees    Other  Recommendations Oral  Care Recommendations: Oral care BID   Follow up Recommendations Other (comment)(TBD)      Frequency and Duration min 1 x/week  1 week       Prognosis Prognosis for Safe Diet Advancement: Good      Swallow Study   General Date of Onset: 07/30/18 HPI: Patient is a 58 y.o. male with PMH: ETOH abuse, active tobacco user, COPD, empysema, skin cancer, who was admitted for dyspnea, wheezing and hypoxia. He developed alcohol withdrawl, respiratory failure and right lower lung infiltrate and was intubated on 5/12 and extubated on 5/16 in AM.  Type of Study: Bedside Swallow Evaluation Previous Swallow Assessment: N/A Diet Prior to this Study: NPO Temperature Spikes Noted: No Respiratory Status: Nasal cannula History of Recent Intubation: Yes Length of Intubations (days): 4 days Date extubated: 08/03/18 Behavior/Cognition: Alert;Cooperative;Pleasant mood Oral Cavity Assessment: Within Functional Limits Oral Care Completed by SLP: Yes Oral Cavity - Dentition: Edentulous Self-Feeding Abilities: Needs set up;Needs assist;Total assist;Other (Comment)(Patient unable to bring cup to mouth, decreased UE movement) Patient Positioning: Upright in bed Baseline Vocal Quality: Hoarse Volitional Cough: Congested Volitional Swallow: Able to elicit    Oral/Motor/Sensory Function Overall Oral Motor/Sensory Function: Within functional limits   Ice Chips     Thin Liquid Thin Liquid: Impaired Presentation: Straw;Cup Pharyngeal  Phase Impairments: Cough - Immediate;Throat Clearing - Immediate Other Comments: Patient exhibited immediate coughing on first 3 sips of thin liquids, however this subsided with subsequent trials when patient cued to take smaller sips.    Nectar Thick     Honey Thick     Puree Puree: Within functional limits Presentation: Spoon   Solid     Solid: Impaired Oral Phase Impairments: Impaired mastication Other Comments: patient is edentuolous which  likely resulted in impaired  mastication.      Trevor Williams 08/03/2018,3:04 PM    Sonia Baller, MA, CCC-SLP Speech Therapy [facility] Acute Rehab Pager: [phone]

## 2018-08-03 NOTE — Progress Notes (Signed)
Patient extubated to nasal cannula.  Sitter at bedside and will continue to monitor.  Wasted 250 of fentanyl and 20 mL of midazolam down sink. Cori Therapist, sports was witness

## 2018-08-03 NOTE — Progress Notes (Signed)
NAME:  Trevor Williams, MRN:  [id number], DOB:  May 16, 1960, LOS: 5 ADMISSION DATE:  07/28/2018, CONSULTATION DATE:  07/30/2018 REFERRING MD:  Dr. Florene Glen, [facility], CHIEF COMPLAINT:  Short of breath   Brief History   58 yo male smoker with hx of ETOH presented with dyspnea, wheezing and hypoxia.  Treated for COPD exacerbation.  Developed alcohol withdrawal, respiratory failure, and Rt lower lung infiltrate.  Transferred to ICU 5/12.  Past Medical History  ETOH, COPD/emphysema, Skin cancer  Significant Hospital Events   5/10 Admit 5/12 transfer to ICU 5/14 Rash  Consults:    Procedures:  ETT 5/12 >>  Significant Diagnostic Tests:  CT angio chest 5/10 >> moderate centrilobular emphysema, GGO RUL and LLL, bronchial thickening  Micro Data:  COVID (in house) 5/10 >> negative RVP 5/10 >> negative Blood 5/10 >> COVID (send out) 5/11 >> negative Sputum 5/12 >> normal flora  Antimicrobials:  Rocephin 5/10 >> 5/16 Zithromax 5/10 >> 5/13 Vancomycin 5/12 >> 5/14 Doxycycline 5/13 >> 5/15 Levaquin 5/15 >>5/16   Interim history/subjective:  No events overnight  Objective   Blood pressure 122/81, pulse 88, temperature 98.2 F (36.8 C), temperature source Oral, resp. rate 19, height 5\' 7"  (1.702 m), weight 71 kg, SpO2 92 %.    Vent Mode: PRVC FiO2 (%):  [30 %] 30 % Set Rate:  [20 bmp] 20 bmp Vt Set:  [530 mL] 530 mL PEEP:  [5 cmH20] 5 cmH20 Plateau Pressure:  [13 cmH20-17 cmH20] 14 cmH20   Intake/Output Summary (Last 24 hours) at 08/03/2018 0943 Last data filed at 08/03/2018 0600 Gross per 24 hour  Intake 2283.94 ml  Output 965 ml  Net 1318.94 ml   Filed Weights   08/01/18 0443 08/02/18 0411 08/03/18 0327  Weight: 62.6 kg 68.8 kg 71 kg   Examination:  General - Chronically ill appearing, NAD HEENT: [place]/AT, PERRL, EOM-I and MMM Cardiac - RRR, Nl S1/S2 and -M/R/G Chest - CTA bilaterally Abdomen - Soft, NT, ND and +BS Extremities - -edema and -tenderness Skin - erythematous  rash on trunk, abdomen, thighs Neuro - Alert and interactive, moving all ext to command  I reviewed CXR myself, ETT is in a good position  Resolved Hospital Problem list     Assessment & Plan:   Acute hypoxic respiratory failure from Rt lower Lobar PNA and COPD exacerbation. Tobacco abuse. Hx of COPD with centrilobular emphysema. Plan - Extubate - Titrate O2 for sat of 88-92% - SLP - Ambulate - D/C Levaquin - Nicotine patch  Rash. Discussion: Doesn't appear to be redman syndrome.  ?if from doxycycline or rocephin. Plan - Continue solumedrol, benadryl  Prolonged QTc. Plan - Monitor QTc  Acute metabolic encephalopathy. Alcohol withdrawal. Plan - RASS goal 0 to -1 - D/C sedation - Klonopin to 1 mg N2T - Thiamine, folic acid  Severe protein calorie malnutrition. Plan - SLP  Anemia of critical illness and chronic disease. Thrombocytopenia in setting of ETOH. Plan - F/u CBC  Best practice:  Diet: tube feeds DVT prophylaxis: SCDs GI prophylaxis: pepcid Mobility: bed rest Code Status: full code Disposition: ICU  Labs    CMP Latest Ref Rng & Units 08/03/2018 08/02/2018 08/01/2018  Glucose 70 - 99 mg/dL 227(H) 133(H) 163(H)  BUN 6 - 20 mg/dL 15 11 15   Creatinine 0.61 - 1.24 mg/dL 0.68 0.69 0.74  Sodium 135 - 145 mmol/L 137 138 140  Potassium 3.5 - 5.1 mmol/L 4.1 4.2 4.1  Chloride 98 - 111 mmol/L 107 109 109  CO2 22 - 32 mmol/L 23 24 22   Calcium 8.9 - 10.3 mg/dL 8.1(L) 7.8(L) 8.0(L)  Total Protein 6.5 - 8.1 g/dL - - -  Total Bilirubin 0.3 - 1.2 mg/dL - - -  Alkaline Phos 38 - 126 U/L - - -  AST 15 - 41 U/L - - -  ALT 0 - 44 U/L - - -   CBC Latest Ref Rng & Units 08/03/2018 08/02/2018 08/01/2018  WBC 4.0 - 10.5 K/uL 6.5 6.3 5.8  Hemoglobin 13.0 - 17.0 g/dL 10.0(L) 9.7(L) 9.2(L)  Hematocrit 39.0 - 52.0 % 30.3(L) 29.5(L) 28.1(L)  Platelets 150 - 400 K/uL 63(L) 42(L) 32(L)   ABG    Component Value Date/Time   PHART 7.373 07/31/2018 0310   PCO2ART 43.9  07/31/2018 0310   PO2ART 75.8 (L) 07/31/2018 0310   HCO3 24.9 07/31/2018 0310   TCO2 23 07/30/2018 1017   ACIDBASEDEF 4.0 (H) 07/30/2018 1017   O2SAT 94.0 07/31/2018 0310   CBG (last 3)  Recent Labs    08/02/18 2328 08/03/18 0338 08/03/18 0711  GLUCAP 187* 225* 208*   The patient is critically ill with multiple organ systems failure and requires high complexity decision making for assessment and support, frequent evaluation and titration of therapies, application of advanced monitoring technologies and extensive interpretation of multiple databases.   Critical Care Time devoted to patient care services described in this note is  32  Minutes. This time reflects time of care of this signee Dr Jennet Maduro. This critical care time does not reflect procedure time, or teaching time or supervisory time of PA/NP/Med student/Med Resident etc but could involve care discussion time.  Rush Farmer, M.D. [facility] Pulmonary/Critical Care Medicine. Pager: [phone]. After hours pager: [phone].

## 2018-08-04 ENCOUNTER — Inpatient Hospital Stay (HOSPITAL_COMMUNITY): Payer: Medicaid Other

## 2018-08-04 DIAGNOSIS — Z590 Homelessness: Secondary | ICD-10-CM

## 2018-08-04 DIAGNOSIS — F10239 Alcohol dependence with withdrawal, unspecified: Secondary | ICD-10-CM

## 2018-08-04 DIAGNOSIS — I340 Nonrheumatic mitral (valve) insufficiency: Secondary | ICD-10-CM

## 2018-08-04 DIAGNOSIS — D638 Anemia in other chronic diseases classified elsewhere: Secondary | ICD-10-CM

## 2018-08-04 DIAGNOSIS — F17200 Nicotine dependence, unspecified, uncomplicated: Secondary | ICD-10-CM

## 2018-08-04 DIAGNOSIS — D696 Thrombocytopenia, unspecified: Secondary | ICD-10-CM

## 2018-08-04 DIAGNOSIS — J441 Chronic obstructive pulmonary disease with (acute) exacerbation: Secondary | ICD-10-CM

## 2018-08-04 DIAGNOSIS — F1020 Alcohol dependence, uncomplicated: Secondary | ICD-10-CM

## 2018-08-04 DIAGNOSIS — E43 Unspecified severe protein-calorie malnutrition: Secondary | ICD-10-CM

## 2018-08-04 DIAGNOSIS — R7989 Other specified abnormal findings of blood chemistry: Secondary | ICD-10-CM

## 2018-08-04 DIAGNOSIS — I361 Nonrheumatic tricuspid (valve) insufficiency: Secondary | ICD-10-CM

## 2018-08-04 LAB — BASIC METABOLIC PANEL
Anion gap: 9 (ref 5–15)
BUN: 17 mg/dL (ref 6–20)
CO2: 22 mmol/L (ref 22–32)
Calcium: 8.6 mg/dL — ABNORMAL LOW (ref 8.9–10.3)
Chloride: 107 mmol/L (ref 98–111)
Creatinine, Ser: 0.77 mg/dL (ref 0.61–1.24)
GFR calc Af Amer: >60 mL/min (ref >60)
GFR calc non Af Amer: >60 mL/min (ref >60)
Glucose, Bld: 123 mg/dL — ABNORMAL HIGH (ref 70–99)
Potassium: 3.8 mmol/L (ref 3.5–5.1)
Sodium: 138 mmol/L (ref 135–145)

## 2018-08-04 LAB — GLUCOSE, CAPILLARY
Glucose-Capillary: 139 mg/dL — ABNORMAL HIGH (ref 70–99)
Glucose-Capillary: 113 mg/dL — ABNORMAL HIGH (ref 70–99)
Glucose-Capillary: 131 mg/dL — ABNORMAL HIGH (ref 70–99)
Glucose-Capillary: 151 mg/dL — ABNORMAL HIGH (ref 70–99)

## 2018-08-04 LAB — MAGNESIUM: Magnesium: 2 mg/dL (ref 1.7–2.4)

## 2018-08-04 LAB — CBC
HCT: 29.5 % — ABNORMAL LOW (ref 39.0–52.0)
Hemoglobin: 9.9 g/dL — ABNORMAL LOW (ref 13.0–17.0)
MCH: 37.2 pg — ABNORMAL HIGH (ref 26.0–34.0)
MCHC: 33.6 g/dL (ref 30.0–36.0)
MCV: 110.9 fL — ABNORMAL HIGH (ref 80.0–100.0)
Platelets: 90 10*3/uL — ABNORMAL LOW (ref 150–400)
RBC: 2.66 MIL/uL — ABNORMAL LOW (ref 4.22–5.81)
RDW: 13.6 % (ref 11.5–15.5)
WBC: 14.3 10*3/uL — ABNORMAL HIGH (ref 4.0–10.5)
nRBC: 0.1 % (ref 0.0–0.2)

## 2018-08-04 LAB — PHOSPHORUS: Phosphorus: 4.3 mg/dL (ref 2.5–4.6)

## 2018-08-04 MED ORDER — FUROSEMIDE 10 MG/ML IJ SOLN
20.0000 mg | Freq: Two times a day (BID) | INTRAMUSCULAR | Status: DC
  Administered 2018-08-04 – 2018-08-07 (×7): 20 mg via INTRAVENOUS
  Filled 2018-08-04 (×6): qty 2

## 2018-08-04 NOTE — TOC Initial Note (Signed)
Transition of Care [facility]) - Initial/Assessment Note    Patient Details  Name: Trevor Williams MRN: [id number] Date of Birth: 1960-05-17  Transition of [facility]) CM/SW Contact:    Gelene Mink, [place] Phone Number: 08/04/2018, 1:12 PM  Clinical Narrative:                  CSW provided the patient with homeless resources. Patient has applied for disability and has been denied. Patient's medicaid is currently pending. Patient has stayed at [facility] in the past and does not want to return. CSW mentioned [facility] shelter in [place], patient declined and stated that he wants to stay in [place]. CSW discussed transportation. The patient uses the bus currently since it is free. The patient cannot afford a hotel room due to not having funds. Patient plans on going to [facility] resource center once he is discharged from the hospital.   [place] will continue to follow.   Expected Discharge Plan: Homeless Shelter Barriers to Discharge: Continued Medical Work up   Patient Goals and CMS Choice     Choice offered to / list presented to : NA  Expected Discharge Plan and Services Expected Discharge Plan: Homeless Shelter In-house Referral: Clinical Social Work     Living arrangements for the past 2 months: Homeless                 DME Arranged: N/A DME Agency: NA       HH Arranged: NA [place] Agency: NA        Prior Living Arrangements/Services Living arrangements for the past 2 months: Homeless Lives with:: Self Patient language and need for interpreter reviewed:: No Do you feel safe going back to the place where you live?: Yes      Need for Family Participation in Patient Care: No (Comment) Care giver support system in place?: Yes (comment)   Criminal Activity/Legal Involvement Pertinent to Current Situation/Hospitalization: No - Comment as needed  Activities of Daily Living Home Assistive Devices/Equipment: None ADL Screening (condition at time of  admission) Patient's cognitive ability adequate to safely complete daily activities?: Yes Is the patient deaf or have difficulty hearing?: No Does the patient have difficulty seeing, even when wearing glasses/contacts?: No Does the patient have difficulty concentrating, remembering, or making decisions?: No Patient able to express need for assistance with ADLs?: Yes Does the patient have difficulty dressing or bathing?: No Independently performs ADLs?: Yes (appropriate for developmental age) Does the patient have difficulty walking or climbing stairs?: Yes Weakness of Legs: Both Weakness of Arms/Hands: None  Permission Sought/Granted Permission sought to share information with : Case Manager Permission granted to share information with : Yes, Verbal Permission Granted              Emotional Assessment Appearance:: Appears stated age Attitude/Demeanor/Rapport: Lethargic Affect (typically observed): Calm Orientation: : Oriented to Self, Oriented to  Time, Oriented to Place, Oriented to Situation Alcohol / Substance Use: Alcohol Use Psych Involvement: No (comment)  Admission diagnosis:  Shortness of breath [R06.02] Hypoxia [R09.02] COPD exacerbation (HCC) [J44.1] Multifocal pneumonia [J18.9] Acute respiratory failure with hypoxia ([place]) [J96.01] Patient Active Problem List   Diagnosis Date Noted  . Acute respiratory failure with hypoxia ([place]) 07/29/2018  . Community acquired pneumonia 07/29/2018   PCP:  Marliss Coots, NP Pharmacy:  No Pharmacies Listed    Social Determinants of Health (SDOH) Interventions    Readmission Risk Interventions No flowsheet data found.

## 2018-08-04 NOTE — Progress Notes (Signed)
Rehab Admissions Coordinator Note:  Per PT recommendation, patient was screened by Michel Santee, PT, DPT for appropriateness for an Inpatient Acute Rehab Consult.  At this time, we are recommending Inpatient Rehab consult. I will contact MD for order.   Michel Santee 08/04/2018, 3:11 PM  I can be reached at [phone].

## 2018-08-04 NOTE — Progress Notes (Signed)
  Echocardiogram 2D Echocardiogram has been performed.  Trevor Williams 08/04/2018, 5:04 PM

## 2018-08-04 NOTE — Care Management (Signed)
Consult for meds and PCP. Patient has Trevor Williams from [facility] listed as PCP, will need follow up apt made closer to DC. Will follow for med assistance and consider TOC at time of DC.

## 2018-08-04 NOTE — Progress Notes (Signed)
PROGRESS NOTE  Trevor Williams OMV:[id number] DOB: [birth year] DOA: 07/28/2018 PCP: Marliss Coots, NP   LOS: 6 days   Patient is from: Homeless.  Lives alone.  No assistive device at baseline.  Brief Narrative / Interim history: 58 year old male with history of tobacco use disorder (40-pack-year history), alcohol use disorder and skin cancer presenting with progressive shortness of breath and DOE for 1 months.  Has had intermittent chest pain.  EMS called and patient was placed on nonrebreather, given epinephrine, Solu-Medrol, magnesium and albuterol and brought to ED.  In ED, patient was wheezing and hypoxemic.  ABG seven-point 3/46/47/23.  BNP elevated to 412 (429 previously).  CRP less than 0.8.  CBC with macrocytic anemia at the baseline, and thrombocytopenia to 40 (about baseline).  Troponin mildly.  EKG NSR with possible lateral wall ischemia.  D-dimer was elevated.  CTA chest was obtained and showed multifocal pneumonia and changes consistent with emphysema and possible CAD.  COVID-19 test was negative.  Patient was admitted for acute respiratory failure with hypoxemia secondary to pneumonia and possible COPD exacerbation.   Next day, he desaturated to mid 56s and developed alcohol withdrawal and transferred to ICU.  He was intubated on 5/12 and extubated on 5/16.  Put on Precedex for alcohol withdrawal.  Treated with broad-spectrum antibiotics for pneumonia.  TRH reassumed his care on 5/17  Subjective: No major events overnight of this morning.  He says it still hard to breathe.  Denies chest pain.  Reports weight cough.  He said he did not look at the phlegm.  He does not think he has hemoptysis.  Tells me he smokes about a pack a day for the last 40 years.  Also reports drinking half a pint of liquor daily.  No family member.   Assessment & Plan: Acute respiratory failure with hypoxemia: Multifactorial including pneumonia, COPD exacerbation and possible CHF exacerbation.  Initial ABG  with compensated respiratory acidosis and hypoxemia.  Improved. -Treated treatable causes as below -Wean oxygen as able -PT/OT  Community-acquired pneumonia: Noted on CTA chest.  Completed course of antibiotics.  COVID-19 negative x2.  RVP negative.  Blood and sputum cultures not impressive. -Rocephin 5/10 >> 5/16 -Zithromax 5/10 >> 5/13 -Vancomycin 5/12 >> 5/14 -Doxycycline 5/13 >> 5/15 -Levaquin 5/15 >>5/16  -Incentive spirometry -Mucolytic's  COPD exacerbation: No formal diagnosis but 40-pack-year history.  Current smoker.  Not on treatment at home. -Start LAMA/LABA -Solu-Medrol through 5/18 -PRN DuoNeb  Possible CHF exacerbation: Resented with worsening dyspnea on exertion for months.  BNP elevated but down trended.  Never had echocardiogram. -Check echocardiogram -IV Lasix 20 mg twice daily -Daily weight, intake output and renal function -Closely monitor electrolytes.  Hyperglycemia: No diagnosis of diabetes. -CBG monitoring -Sliding scale insulin -Check A1c and lipid panel  Macrocytic anemia: Hemoglobin at baseline.  Anemia panel consistent with anemia of chronic disease. -Monitor as needed  Alcohol use disorder/alcohol withdrawal: reports drinking about half a pint of liquor a day -ETT 5/12-5/16 -Cessation counseling -Continue multivitamins thiamine and folic acid -Monitor electrolytes  Tobacco use disorder: Current smoker about a pack a day.  40-pack-year history. -Cessation counseling -Nicotine patch  Thrombocytopenia: Likely due to alcohol.  Better than baseline during this hospitalization.  Severe protein calorie malnutrition: BMI 23.86.  Likely due to acute on chronic illness, and alcohol use disorder. -Appreciate nutrition input.  We will continue supplements  Social situation: Patient is homeless, no PCP. 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   Scheduled Meds: . Chlorhexidine Gluconate  Cloth  6 each Topical [id number]  . clonazePAM  1 mg Oral Q8H  . famotidine   20 mg Oral BID  . feeding supplement (PRO-STAT SUGAR FREE 64)  30 mL Oral BID  . folic acid  1 mg Oral Daily  . guaiFENesin  5 mL Oral BID  . insulin aspart  2-6 Units Subcutaneous Q4H  . ipratropium-albuterol  3 mL Nebulization Q6H  . mouth rinse  15 mL Mouth Rinse BID  . methylPREDNISolone (SOLU-MEDROL) injection  40 mg Intravenous Q6H  . multivitamin with minerals  1 tablet Oral Daily  . nicotine  21 mg Transdermal Daily  . thiamine  100 mg Oral Daily   Continuous Infusions: . sodium chloride 10 mL/hr at 08/01/18 2200  . feeding supplement (OSMOLITE 1.2 CAL) Stopped (08/03/18 1900)  . lactated ringers Stopped (08/03/18 1608)   PRN Meds:.acetaminophen, albuterol, bisacodyl, docusate, fentaNYL, midazolam   DVT prophylaxis: SCD due to thrombocytopenia Code Status: Full code Family Communication: Patient has no family or close relatives Disposition Plan: We will transfer to medical telemetry.  Remains inpatient pending improvement in his respiratory failure.  Currently on 5 L by nasal cannula.  Patient was not on oxygen prior to admission.  Consultants:   PCCM  Procedures:   ETT 5/12-5/16  Microbiology: COVID (in house) 5/10 >> negative RVP 5/10 >> negative Blood 5/10 >> negative COVID (send out) 5/11 >> negative Sputum 5/12 >> normal flora  Antimicrobials: Anti-infectives (From admission, onward)   Start     Dose/Rate Route Frequency Ordered Stop   08/02/18 1000  levofloxacin (LEVAQUIN) 25 MG/ML solution 750 mg     750 mg Per Tube Daily 08/02/18 0918 08/03/18 0944   08/01/18 0900  doxycycline (VIBRAMYCIN) 100 mg in sodium chloride 0.9 % 250 mL IVPB  Status:  Discontinued     100 mg 125 mL/hr over 120 Minutes Intravenous Every 12 hours 08/01/18 0837 08/02/18 0846   07/31/18 2100  doxycycline (VIBRAMYCIN) 100 mg in sodium chloride 0.9 % 250 mL IVPB  Status:  Discontinued     100 mg 125 mL/hr over 120 Minutes Intravenous Every 12 hours 07/31/18 2002 08/01/18 0837    07/31/18 0200  vancomycin (VANCOCIN) IVPB 750 mg/150 ml premix  Status:  Discontinued     750 mg 150 mL/hr over 60 Minutes Intravenous Every 12 hours 07/30/18 0834 08/01/18 0837   07/30/18 1000  cefTRIAXone (ROCEPHIN) 2 g in sodium chloride 0.9 % 100 mL IVPB  Status:  Discontinued     2 g 200 mL/hr over 30 Minutes Intravenous Every 24 hours 07/30/18 0832 08/02/18 0846   07/30/18 0845  vancomycin (VANCOCIN) 1,250 mg in sodium chloride 0.9 % 250 mL IVPB     1,250 mg 166.7 mL/hr over 90 Minutes Intravenous  Once 07/30/18 0831 07/30/18 1809   07/29/18 2000  cefTRIAXone (ROCEPHIN) 1 g in sodium chloride 0.9 % 100 mL IVPB  Status:  Discontinued     1 g 200 mL/hr over 30 Minutes Intravenous Every 24 hours 07/29/18 0114 07/30/18 0832   07/29/18 2000  azithromycin (ZITHROMAX) 500 mg in sodium chloride 0.9 % 250 mL IVPB  Status:  Discontinued     500 mg 250 mL/hr over 60 Minutes Intravenous Every 24 hours 07/29/18 0114 07/31/18 2002   07/28/18 2215  cefTRIAXone (ROCEPHIN) 1 g in sodium chloride 0.9 % 100 mL IVPB     1 g 200 mL/hr over 30 Minutes Intravenous  Once 07/28/18 2203 07/28/18 2326   07/28/18 2215  azithromycin (ZITHROMAX) 500 mg in sodium chloride 0.9 % 250 mL IVPB     500 mg 250 mL/hr over 60 Minutes Intravenous  Once 07/28/18 2203 07/28/18 2354       Objective: Vitals:   08/04/18 0500 08/04/18 0600 08/04/18 0700 08/04/18 0742  BP: 138/84 127/76 (!) 143/90   Pulse: (!) 106 (!) 106 100   Resp: 19 (!) 23 20   Temp:    97.8 F (36.6 C)  TempSrc:    Oral  SpO2: 94% 92% 94%   Weight:  69.1 kg    Height:        Intake/Output Summary (Last 24 hours) at 08/04/2018 0747 Last data filed at 08/04/2018 0600 Gross per 24 hour  Intake 1139.33 ml  Output 1070 ml  Net 69.33 ml   Filed Weights   08/02/18 0411 08/03/18 0327 08/04/18 0600  Weight: 68.8 kg 71 kg 69.1 kg    Examination:  GENERAL: No acute distress.  Appears well.  HEENT: MMM.  Vision and hearing grossly intact.   NECK: Supple.  No apparent JVD. LUNGS: Saturating at 93% on 5 L by [place].  No significant IWOB.  Diminished aeration bilaterally.  Rhonchi bilaterally.  Crackles over right lower lung HEART:  RRR. Heart sounds normal.  No apparent JVD or edema. ABD: Bowel sounds present. Soft. Non tender.  MSK/EXT:  Moves all extremities. No apparent deformity. No edema bilaterally.  SKIN: no apparent skin lesion or wound NEURO: Awake, alert and oriented appropriately.  No gross deficit.  PSYCH: Calm. Normal affect.    Data Reviewed: I have independently reviewed following labs and imaging studies  CBC: Recent Labs  Lab 07/28/18 2012 07/29/18 0127  07/31/18 0412 08/01/18 0324 08/02/18 0216 08/03/18 0255 08/04/18 0450  WBC 4.5 2.0*   < > 6.9 5.8 6.3 6.5 14.3*  NEUTROABS 1.5* 1.4*  --   --   --  5.0  --   --   HGB 11.2* 11.2*   < > 9.0* 9.2* 9.7* 10.0* 9.9*  HCT 33.6* 33.6*   < > 27.3* 28.1* 29.5* 30.3* 29.5*  MCV 110.5* 110.2*   < > 110.5* 113.8* 112.6* 111.4* 110.9*  PLT 47* 46*   < > 34* 32* 42* 63* 90*   < > = values in this interval not displayed.   Basic Metabolic Panel: Recent Labs  Lab 07/30/18 1759 07/31/18 0412 07/31/18 1634 08/01/18 0324 08/02/18 0216 08/03/18 0255 08/04/18 0450  NA  --  141  --  140 138 137 138  K  --  3.6  --  4.1 4.2 4.1 3.8  CL  --  109  --  109 109 107 107  CO2  --  23  --  22 24 23 22   GLUCOSE  --  71  --  163* 133* 227* 123*  BUN  --  18  --  15 11 15 17   CREATININE  --  0.79  --  0.74 0.69 0.68 0.77  CALCIUM  --  7.9*  --  8.0* 7.8* 8.1* 8.6*  MG 2.1 2.1 2.0  --  1.8  --  2.0  PHOS 3.5 2.4* 2.8  --  2.9  --  4.3   GFR: Estimated Creatinine Clearance: 95.2 mL/min (by C-G formula based on SCr of 0.77 mg/dL). Liver Function Tests: Recent Labs  Lab 07/28/18 2012 07/29/18 0127 07/30/18 0607 07/31/18 0412  AST 197* 175* 152* 71*  ALT 74* 75* 68* 53*  ALKPHOS 107 109 96 69  BILITOT 0.7 0.7 1.1 0.8  PROT 7.3 7.6 7.0 6.1*  ALBUMIN 3.6 3.6 3.5  2.9*   No results for input(s): LIPASE, AMYLASE in the last 168 hours. No results for input(s): AMMONIA in the last 168 hours. Coagulation Profile: No results for input(s): INR, PROTIME in the last 168 hours. Cardiac Enzymes: Recent Labs  Lab 07/28/18 2012 07/29/18 0127 07/29/18 0748 07/29/18 1418  TROPONINI <0.03 <0.03 <0.03 0.03*   BNP (last 3 results) No results for input(s): PROBNP in the last 8760 hours. HbA1C: No results for input(s): HGBA1C in the last 72 hours. CBG: Recent Labs  Lab 08/03/18 1508 08/03/18 2000 08/03/18 2345 08/04/18 0343 08/04/18 0713  GLUCAP 118* 131* 120* 139* 113*   Lipid Profile: No results for input(s): CHOL, HDL, LDLCALC, TRIG, CHOLHDL, LDLDIRECT in the last 72 hours. Thyroid Function Tests: No results for input(s): TSH, T4TOTAL, FREET4, T3FREE, THYROIDAB in the last 72 hours. Anemia Panel: No results for input(s): VITAMINB12, FOLATE, FERRITIN, TIBC, IRON, RETICCTPCT in the last 72 hours. Urine analysis:    Component Value Date/Time   COLORURINE YELLOW 06/01/2018 1820   APPEARANCEUR CLEAR 06/01/2018 1820   LABSPEC 1.014 06/01/2018 1820   PHURINE 7.0 06/01/2018 1820   GLUCOSEU NEGATIVE 06/01/2018 1820   HGBUR NEGATIVE 06/01/2018 1820   [place] NEGATIVE 06/01/2018 [place] 06/01/2018 1820   PROTEINUR 30 (A) 06/01/2018 1820   NITRITE NEGATIVE 06/01/2018 1820   LEUKOCYTESUR NEGATIVE 06/01/2018 1820   Sepsis Labs: Invalid input(s): PROCALCITONIN, LACTICIDVEN  Recent Results (from the past 240 hour(s))  SARS Coronavirus 2 [facility] order, Performed in [facility] hospital lab)     Status: None   Collection Time: 07/28/18  8:36 PM  Result Value Ref Range Status   SARS Coronavirus 2 NEGATIVE NEGATIVE Final    Comment: (NOTE) If result is NEGATIVE SARS-CoV-2 target nucleic acids are NOT DETECTED. The SARS-CoV-2 RNA is generally detectable in upper and lower  respiratory specimens during the acute phase of  infection. The lowest  concentration of SARS-CoV-2 viral copies this assay can detect is 250  copies / mL. A negative result does not preclude SARS-CoV-2 infection  and should not be used as the sole basis for treatment or other  patient management decisions.  A negative result may occur with  improper specimen collection / handling, submission of specimen other  than nasopharyngeal swab, presence of viral mutation(s) within the  areas targeted by this assay, and inadequate number of viral copies  (<250 copies / mL). A negative result must be combined with clinical  observations, patient history, and epidemiological information. If result is POSITIVE SARS-CoV-2 target nucleic acids are DETECTED. The SARS-CoV-2 RNA is generally detectable in upper and lower  respiratory specimens dur ing the acute phase of infection.  Positive  results are indicative of active infection with SARS-CoV-2.  Clinical  correlation with patient history and other diagnostic information is  necessary to determine patient infection status.  Positive results do  not rule out bacterial infection or co-infection with other viruses. If result is PRESUMPTIVE POSTIVE SARS-CoV-2 nucleic acids MAY BE PRESENT.   A presumptive positive result was obtained on the submitted specimen  and confirmed on repeat testing.  While 2019 novel coronavirus  (SARS-CoV-2) nucleic acids may be present in the submitted sample  additional confirmatory testing may be necessary for epidemiological  and / or clinical management purposes  to differentiate between  SARS-CoV-2 and other Sarbecovirus currently known to infect humans.  If clinically indicated  additional testing with an alternate test  methodology [phone]) is advised. The SARS-CoV-2 RNA is generally  detectable in upper and lower respiratory sp ecimens during the acute  phase of infection. The expected result is Negative. Fact Sheet for Patients:   StrictlyIdeas.no Fact Sheet for Healthcare Providers: BankingDealers.co.za This test is not yet approved or cleared by the [place] FDA and has been authorized for detection and/or diagnosis of SARS-CoV-2 by FDA under an Emergency Use Authorization (EUA).  This EUA will remain in effect (meaning this test can be used) for the duration of the COVID-19 declaration under Section 564(b)(1) of the Act, 21 U.S.C. section 360bbb-3(b)(1), unless the authorization is terminated or revoked sooner. Performed at [facility], [place] [address] [place], [place] [address]   Novel Coronavirus,NAA,(SEND-OUT TO REF LAB - TAT 24-48 hrs); Hosp Order     Status: None   Collection Time: 07/28/18  8:36 PM  Result Value Ref Range Status   SARS-CoV-2, NAA NOT DETECTED NOT DETECTED Final    Comment: (NOTE) This test was developed and its performance characteristics determined by [employer]. This test has not been FDA cleared or approved. This test has been authorized by FDA under an Emergency Use Authorization (EUA). This test is only authorized for the duration of time the declaration that circumstances exist justifying the authorization of the emergency use of in vitro diagnostic tests for detection of SARS-CoV-2 virus and/or diagnosis of COVID-19 infection under section 564(b)(1) of the Act, 21 U.S.C. 144YJE-5(U)(3), unless the authorization is terminated or revoked sooner. When diagnostic testing is negative, the possibility of a false negative result should be considered in the context of a patient's recent exposures and the presence of clinical signs and symptoms consistent with COVID-19. An individual without symptoms of COVID-19 and who is not shedding SARS-CoV-2 virus would expect to have a negative (not detected) result in this assay. Performed  At: [facility] [address] [place], [place] [id number] Rush Farmer MD  CH:[id number]    [place]  Final    Comment: Performed at [facility], [place] [address], [place], [place] [address]  Respiratory Panel by PCR     Status: None   Collection Time: 07/28/18  8:36 PM  Result Value Ref Range Status   Adenovirus NOT DETECTED NOT DETECTED Final   Coronavirus 229E NOT DETECTED NOT DETECTED Final    Comment: (NOTE) The Coronavirus on the Respiratory Panel, DOES NOT test for the novel  Coronavirus (2019 nCoV)    Coronavirus HKU1 NOT DETECTED NOT DETECTED Final   Coronavirus NL63 NOT DETECTED NOT DETECTED Final   Coronavirus OC43 NOT DETECTED NOT DETECTED Final   Metapneumovirus NOT DETECTED NOT DETECTED Final   Rhinovirus / Enterovirus NOT DETECTED NOT DETECTED Final   Influenza A NOT DETECTED NOT DETECTED Final   Influenza B NOT DETECTED NOT DETECTED Final   Parainfluenza Virus 1 NOT DETECTED NOT DETECTED Final   Parainfluenza Virus 2 NOT DETECTED NOT DETECTED Final   Parainfluenza Virus 3 NOT DETECTED NOT DETECTED Final   Parainfluenza Virus 4 NOT DETECTED NOT DETECTED Final   Respiratory Syncytial Virus NOT DETECTED NOT DETECTED Final   Bordetella pertussis NOT DETECTED NOT DETECTED Final   Chlamydophila pneumoniae NOT DETECTED NOT DETECTED Final   Mycoplasma pneumoniae NOT DETECTED NOT DETECTED Final    Comment: Performed at [facility], [place]. [address], [place], [place] [address]  Blood culture (routine x 2)     Status:  None   Collection Time: 07/28/18 10:25 PM  Result Value Ref Range Status   Specimen Description BLOOD RIGHT UPPER ARM  Final   Special Requests   Final    BOTTLES DRAWN AEROBIC AND ANAEROBIC Blood Culture adequate volume   Culture   Final    NO GROWTH 5 DAYS Performed at [facility], [address], [place], [place] [address]    Report Status 08/02/2018 FINAL  Final  Blood culture (routine x 2)     Status: None   Collection Time: 07/28/18 10:36 PM  Result Value Ref Range Status    Specimen Description BLOOD RIGHT ARM  Final   Special Requests   Final    BOTTLES DRAWN AEROBIC AND ANAEROBIC Blood Culture adequate volume   Culture   Final    NO GROWTH 5 DAYS Performed at [facility], [place] [address], [place], [place] [address]    Report Status 08/02/2018 FINAL  Final  Culture, sputum-assessment     Status: None   Collection Time: 07/29/18  3:14 PM  Result Value Ref Range Status   Specimen Description EXPECTORATED SPUTUM  Final   Special Requests NONE  Final   Sputum evaluation   Final    Sputum specimen not acceptable for testing.  Please recollect.   RESULT CALLED TO, READ BACK BY AND VERIFIED WITH: Brennan Bailey RN [id number] 07/29/18 A BROWNING Performed at [facility], [place] [address] [place], [place] [address]    Report Status 07/29/2018 FINAL  Final  MRSA PCR Screening     Status: None   Collection Time: 07/30/18 10:00 AM  Result Value Ref Range Status   MRSA by PCR NEGATIVE NEGATIVE Final    Comment:        The GeneXpert MRSA Assay (FDA approved for NASAL specimens only), is one component of a comprehensive MRSA colonization surveillance program. It is not intended to diagnose MRSA infection nor to guide or monitor treatment for MRSA infections. Performed at [facility], [place] [address], [place], [place] [address]   Culture, respiratory (non-expectorated)     Status: None   Collection Time: 07/30/18 10:21 AM  Result Value Ref Range Status   Specimen Description TRACHEAL ASPIRATE  Final   Special Requests NONE  Final   Gram Stain NO WBC SEEN NO ORGANISMS SEEN   Final   Culture   Final    RARE Consistent with normal respiratory flora. Performed at [facility], [place] [address], [place], [place] [address]    Report Status 08/01/2018 FINAL  Final      Radiology Studies: No results found.  35 minutes with more than 50% spent in reviewing records, counseling patient and coordinating care.  Sriansh Farra [facility]  Pager [phone]  If 7PM-7AM, please contact night-coverage [email or web address] Password [facility] 08/04/2018, 7:47 AM

## 2018-08-04 NOTE — Evaluation (Addendum)
Physical Therapy Evaluation Patient Details Name: Trevor Williams MRN: [id number] DOB: 09/06/1960 Today's Date: 08/04/2018   History of Present Illness  Patient is a 58 y.o. male with PMH: ETOH abuse, active tobacco user, COPD, empysema, skin cancer, who was admitted for dyspnea, wheezing and hypoxia. He developed alcohol withdrawl, respiratory failure and right lower lung infiltrate and was intubated on 5/12 and extubated on 5/16 in AM.   Clinical Impression   Pt admitted with above diagnosis. Pt currently with functional limitations due to the deficits listed below (see PT Problem List). Independent prior to admission; lives alone in a tent in the woods; Presents with significantly increased fall risk, weakness, decr functional capacity;  Pt will benefit from skilled PT to increase their independence and safety with mobility to allow discharge to the venue listed below.    He clearly needs post-acute rehabilitation -- was independent prior to this admission, and has good rehab postential; given his insurance status, I'm not sure that he would receive therapy services at SNF -- If we make a solid plan to get him to a shelter, I wonder if CIR would consider taking him for intensive rehab to maximize independence and safety with mobility prior to getting to the shelter.     Follow Up Recommendations CIR    Equipment Recommendations  Rolling walker with 5" wheels;3in1 (PT)    Recommendations for Other Services Other (comment)(SW as ordered; if we have a solid dc plan for dc to shelter, would CIR consider him for intensive rehab?)     Precautions / Restrictions Precautions Precautions: Fall Precaution Comments: High fall risk Restrictions Weight Bearing Restrictions: No      Mobility  Bed Mobility Overal bed mobility: Needs Assistance Bed Mobility: Supine to Sit     Supine to sit: Mod assist     General bed mobility comments: light mod handheld assist to pull to sit adn square off  hips at EOB; slow moving  Transfers Overall transfer level: Needs assistance Equipment used: 1 person hand held assist Transfers: Sit to/from Stand Sit to Stand: Mod assist         General transfer comment: Mod assist to rise and steady; needed bilateral handheld assist once he started taking steps  Ambulation/Gait Ambulation/Gait assistance: Mod assist;+2 physical assistance;+2 safety/equipment Gait Distance (Feet): 10 Feet Assistive device: 2 person hand held assist Gait Pattern/deviations: Step-to pattern;Decreased step length - right;Decreased step length - left;Scissoring(Erratic step width) Gait velocity: slow   General Gait Details: Extremely unsteady in standing and single limb stance; multiple losses of balance requiring mod assist to correct and stay standing; Noted HR 136 max with amb  Stairs            Wheelchair Mobility    Modified Rankin (Stroke Patients Only)       Balance Overall balance assessment: Needs assistance Sitting-balance support: Bilateral upper extremity supported Sitting balance-Leahy Scale: Fair     Standing balance support: Bilateral upper extremity supported Standing balance-Leahy Scale: Zero(approaching Poor) Standing balance comment: Mod assist prevent fals in standing and walking                             Pertinent Vitals/Pain Pain Assessment: 0-10 Pain Score: 4  Pain Location: Head Pain Descriptors / Indicators: Headache Pain Intervention(s): Monitored during session    Home Living Family/patient expects to be discharged to:: Shelter/Homeless Living Arrangements: Other (Comment)(Tent in the woods)  Prior Function Level of Independence: Independent               Hand Dominance        Extremity/Trunk Assessment   Upper Extremity Assessment Upper Extremity Assessment: Generalized weakness    Lower Extremity Assessment Lower Extremity Assessment: Generalized  weakness       Communication   Communication: No difficulties  Cognition Arousal/Alertness: Awake/alert Behavior During Therapy: WFL for tasks assessed/performed Overall Cognitive Status: No family/caregiver present to determine baseline cognitive functioning                                 General Comments: Indicated he is aware of his risk of falls; indicated he does not have anyone who can potentially help him once out of the hospital      General Comments General comments (skin integrity, edema, etc.): Session conducted on 4 L supplemental O2; HR initially was 115, incr to 136 with amb, decr to 124 with seated rest    Exercises     Assessment/Plan    PT Assessment Patient needs continued PT services  PT Problem List Decreased strength;Decreased activity tolerance;Decreased balance;Decreased mobility;Decreased coordination;Decreased cognition;Decreased knowledge of use of DME;Decreased safety awareness;Decreased knowledge of precautions;Cardiopulmonary status limiting activity       PT Treatment Interventions DME instruction;Gait training;Stair training;Functional mobility training;Therapeutic activities;Therapeutic exercise;Balance training;Neuromuscular re-education;Cognitive remediation;Patient/family education    PT Goals (Current goals can be found in the Care Plan section)  Acute Rehab PT Goals Patient Stated Goal: did not state PT Goal Formulation: With patient Time For Goal Achievement: 08/18/18 Potential to Achieve Goals: Good    Frequency Min 3X/week   Barriers to discharge Other (comment) Experiencing homelessness    Co-evaluation               AM-PAC PT "6 Clicks" Mobility  Outcome Measure Help needed turning from your back to your side while in a flat bed without using bedrails?: A Little Help needed moving from lying on your back to sitting on the side of a flat bed without using bedrails?: A Lot Help needed moving to and from a bed  to a chair (including a wheelchair)?: A Lot Help needed standing up from a chair using your arms (e.g., wheelchair or bedside chair)?: A Lot Help needed to walk in hospital room?: A Lot Help needed climbing 3-5 steps with a railing? : A Lot 6 Click Score: 13    End of Session Equipment Utilized During Treatment: Gait belt;Oxygen Activity Tolerance: Patient tolerated treatment well Patient left: in chair;with call bell/phone within reach(preparing to transfer to 2W) Nurse Communication: Mobility status PT Visit Diagnosis: Unsteadiness on feet (R26.81);Muscle weakness (generalized) (M62.81);History of falling (Z91.81);Ataxic gait (R26.0);Difficulty in walking, not elsewhere classified (R26.2)    Time: 3557-3220 PT Time Calculation (min) (ACUTE ONLY): 15 min   Charges:   PT Evaluation $PT Eval Moderate Complexity: [place], [place]  Acute Rehabilitation Services Pager [phone] Office [place] 08/04/2018, 12:30 PM

## 2018-08-05 LAB — CBC
HCT: 28.4 % — ABNORMAL LOW (ref 39.0–52.0)
Hemoglobin: 9.4 g/dL — ABNORMAL LOW (ref 13.0–17.0)
MCH: 37 pg — ABNORMAL HIGH (ref 26.0–34.0)
MCHC: 33.1 g/dL (ref 30.0–36.0)
MCV: 111.8 fL — ABNORMAL HIGH (ref 80.0–100.0)
Platelets: 101 10*3/uL — ABNORMAL LOW (ref 150–400)
RBC: 2.54 MIL/uL — ABNORMAL LOW (ref 4.22–5.81)
RDW: 13.4 % (ref 11.5–15.5)
WBC: 8.9 10*3/uL (ref 4.0–10.5)
nRBC: 0 % (ref 0.0–0.2)

## 2018-08-05 LAB — BASIC METABOLIC PANEL
Anion gap: 10 (ref 5–15)
BUN: 20 mg/dL (ref 6–20)
CO2: 25 mmol/L (ref 22–32)
Calcium: 8.3 mg/dL — ABNORMAL LOW (ref 8.9–10.3)
Chloride: 106 mmol/L (ref 98–111)
Creatinine, Ser: 0.93 mg/dL (ref 0.61–1.24)
GFR calc Af Amer: >60 mL/min (ref >60)
GFR calc non Af Amer: >60 mL/min (ref >60)
Glucose, Bld: 172 mg/dL — ABNORMAL HIGH (ref 70–99)
Potassium: 3.5 mmol/L (ref 3.5–5.1)
Sodium: 141 mmol/L (ref 135–145)

## 2018-08-05 LAB — GLUCOSE, CAPILLARY
Glucose-Capillary: 151 mg/dL — ABNORMAL HIGH (ref 70–99)
Glucose-Capillary: 183 mg/dL — ABNORMAL HIGH (ref 70–99)
Glucose-Capillary: 156 mg/dL — ABNORMAL HIGH (ref 70–99)
Glucose-Capillary: 139 mg/dL — ABNORMAL HIGH (ref 70–99)

## 2018-08-05 LAB — HEMOGLOBIN A1C
Hgb A1c MFr Bld: 5.3 % (ref 4.8–5.6)
Mean Plasma Glucose: 105.41 mg/dL

## 2018-08-05 MED ORDER — TRAZODONE HCL 50 MG PO TABS
50.0000 mg | ORAL_TABLET | Freq: Every day | ORAL | Status: DC
  Administered 2018-08-05 – 2018-08-11 (×7): 50 mg via ORAL
  Filled 2018-08-05 (×7): qty 1

## 2018-08-05 MED ORDER — UMECLIDINIUM-VILANTEROL 62.5-25 MCG/INH IN AEPB: 1.0000 | INHALATION_SPRAY | Freq: Every day | RESPIRATORY_TRACT | Status: DC

## 2018-08-05 MED ORDER — UMECLIDINIUM-VILANTEROL 62.5-25 MCG/INH IN AEPB
1.0000 | INHALATION_SPRAY | Freq: Every day | RESPIRATORY_TRACT | Status: DC
  Administered 2018-08-06 – 2018-08-12 (×7): 1 via RESPIRATORY_TRACT
  Filled 2018-08-05 (×2): qty 14

## 2018-08-05 MED ORDER — IPRATROPIUM-ALBUTEROL 0.5-2.5 (3) MG/3ML IN SOLN
3.0000 mL | RESPIRATORY_TRACT | Status: DC | PRN
  Administered 2018-08-06: 3 mL via RESPIRATORY_TRACT
  Filled 2018-08-05: qty 3

## 2018-08-05 MED ORDER — ENSURE ENLIVE PO LIQD
237.0000 mL | Freq: Three times a day (TID) | ORAL | Status: DC
  Administered 2018-08-05 – 2018-08-12 (×15): 237 mL via ORAL

## 2018-08-05 NOTE — NC FL2 (Signed)
[place] MEDICAID FL2 LEVEL OF CARE SCREENING TOOL     IDENTIFICATION  Patient Name: Trevor Williams Birthdate: [birth year] Sex: male Admission Date (Current Location): 07/28/2018  [facility] and [place] Number:  Herbalist and Address:  [facility], [place] [address], [place], [place] [address]      Provider Number: [id number]  Attending Physician Name and Address:  Mercy Riding, MD  Relative Name and Phone Number:  Leanor Kail, Relative, [phone]    Current Level of Care: Hospital Recommended Level of Care: [place] Prior Approval Number:    Date Approved/Denied: 08/05/18 PASRR Number: [id number]  Discharge Plan: SNF    Current Diagnoses: Patient Active Problem List   Diagnosis Date Noted  . Acute respiratory failure with hypoxia ([place]) 07/29/2018  . Community acquired pneumonia 07/29/2018    Orientation RESPIRATION BLADDER Height & Weight     Self, Time, Situation, Place  O2(92, [place], 2L) Incontinent, External catheter Weight: 149 lb 4 oz (67.7 kg) Height:  5\' 7"  (170.2 cm)  BEHAVIORAL SYMPTOMS/MOOD NEUROLOGICAL BOWEL NUTRITION STATUS      Continent Diet(Dys 3 diet, thin liquids)  AMBULATORY STATUS COMMUNICATION OF NEEDS Skin   Limited Assist Verbally Other (Comment)(MASD on buttocks, dry skin, rash on abdomen)                       Personal Care Assistance Level of Assistance  Bathing, Dressing, Feeding, Total care Bathing Assistance: Limited assistance Feeding assistance: Limited assistance Dressing Assistance: Limited assistance Total Care Assistance: Limited assistance   Functional Limitations Info  Sight, Hearing, Speech Sight Info: Adequate Hearing Info: Adequate Speech Info: Adequate    SPECIAL CARE FACTORS FREQUENCY  OT (By licensed OT), PT (By licensed PT)     PT Frequency: 5x/wk OT Frequency: 5x/wk            Contractures Contractures Info: Not present    Additional Factors Info   Code Status, Allergies Code Status Info: Full Code Allergies Info:  Doxycycline, Ibuprofen, Rocephin Ceftriaxone, Tylenol Acetaminophen, Vancomycin           Current Medications (08/05/2018):  This is the current hospital active medication list Current Facility-Administered Medications  Medication Dose Route Frequency Provider Last Rate Last Dose  . 0.9 %  sodium chloride infusion  250 mL Intravenous Continuous Gonfa, Taye T, MD 10 mL/hr at 08/01/18 2200    . acetaminophen (TYLENOL) tablet 650 mg  650 mg Oral Q6H PRN Mercy Riding, MD   650 mg at 08/05/18 0945  . bisacodyl (DULCOLAX) suppository 10 mg  10 mg Rectal Daily PRN Wendee Beavers T, MD      . Chlorhexidine Gluconate Cloth 2 % PADS 6 each  6 each Topical Q0600 Mercy Riding, MD   6 each at 08/05/18 0535  . clonazePAM (KLONOPIN) tablet 1 mg  1 mg Oral Q8H Gonfa, Taye T, MD   1 mg at 08/05/18 1315  . docusate (COLACE) 50 MG/5ML liquid 100 mg  100 mg Per Tube BID PRN Wendee Beavers T, MD      . famotidine (PEPCID) tablet 20 mg  20 mg Oral BID Wendee Beavers T, MD   20 mg at 08/05/18 0939  . feeding supplement (ENSURE ENLIVE) (ENSURE ENLIVE) liquid 237 mL  237 mL Oral TID BM Gonfa, Taye T, MD      . fentaNYL (SUBLIMAZE) bolus via infusion 50 mcg  50 mcg Intravenous Q15 min PRN Wendee Beavers  T, MD   50 mcg at 08/03/18 0200  . folic acid (FOLVITE) tablet 1 mg  1 mg Oral Daily Gonfa, Taye T, MD   1 mg at 08/05/18 0940  . furosemide (LASIX) injection 20 mg  20 mg Intravenous BID Wendee Beavers T, MD   20 mg at 08/05/18 0940  . guaiFENesin (ROBITUSSIN) 100 MG/5ML solution 100 mg  5 mL Oral BID Wendee Beavers T, MD   100 mg at 08/05/18 0940  . ipratropium-albuterol (DUONEB) 0.5-2.5 (3) MG/3ML nebulizer solution 3 mL  3 mL Nebulization Q4H PRN Gonfa, Taye T, MD      . lactated ringers infusion   Intravenous Continuous Mercy Riding, MD   Stopped at 08/03/18 1608  . MEDLINE mouth rinse  15 mL Mouth Rinse BID Wendee Beavers T, MD   15 mL at 08/05/18 0945  .  methylPREDNISolone sodium succinate (SOLU-MEDROL) 125 mg/2 mL injection 40 mg  40 mg Intravenous Q6H Gonfa, Taye T, MD   40 mg at 08/05/18 1316  . midazolam (VERSED) bolus via infusion 1-2 mg  1-2 mg Intravenous Q2H PRN Wendee Beavers T, MD   2 mg at 08/02/18 1113  . multivitamin with minerals tablet 1 tablet  1 tablet Oral Daily Wendee Beavers T, MD   1 tablet at 08/05/18 0939  . nicotine (NICODERM CQ - dosed in mg/24 hours) patch 21 mg  21 mg Transdermal Daily Wendee Beavers T, MD   21 mg at 08/05/18 0941  . thiamine (VITAMIN B-1) tablet 100 mg  100 mg Oral Daily Wendee Beavers T, MD   100 mg at 08/05/18 0940  . umeclidinium-vilanterol (ANORO ELLIPTA) 62.5-25 MCG/INH 1 puff  1 puff Inhalation Daily Mercy Riding, MD         Discharge Medications: Please see discharge summary for a list of discharge medications.  Relevant Imaging Results:  Relevant Lab Results:   Additional Information SSN: [id number], Pt will be an [place]

## 2018-08-05 NOTE — Progress Notes (Signed)
Nutrition Follow-up  RD working remotely.  DOCUMENTATION CODES:   Not applicable  INTERVENTION:   -D/c [place] 1.2, due to lack of feeding access -D/c Prostat BID -Continue MVI with minerals daily -Ensure Enlive po TID, each supplement provides 350 kcal and 20 grams of protein  NUTRITION DIAGNOSIS:   Increased nutrient needs related to chronic illness(COPD, emphysema) as evidenced by estimated needs.  Ongoing  GOAL:   Patient will meet greater than or equal to 90% of their needs  Progressing  MONITOR:   PO intake, Supplement acceptance, Diet advancement, Labs, Weight trends, Skin, I & O's  REASON FOR ASSESSMENT:   Consult Assessment of nutrition requirement/status  ASSESSMENT:   58 yo male with PMH of COPD/emphysema, skin cancer, smoker, alcohol abuse who was admitted with COPD exacerbation. Developed alcohol withdrawal and respiratory failure requiring intubation and transfer to the ICU on 5/12.  5/16- extubated, s/p BSE- advanced to dysphagia 3 diet with thin liquids  Reviewed I/O's: -85 ml x 24 hours and +14.7 L since admission  UOP: 85 ml x 24 hours  Spoke with pt on phone, who reports he is feeling better today. He reports he usually has a pretty good appetite and consumed 2 meals per day PTA (meal consisted of foods such as chicken or pork chops). Pt denied any trouble accessing foods; noted pt was living in a tent PTA. Pt shares that his swallowing and appetite are improving; consumed about 50% of lunch of roast beef and mashed potatoes this afternoon (noted documented meal completion 60%).   Pt shares that he was not eating well for a few days PTA due to no feeling well. He denies any weight loss PTA, but believes he has lost weight in the hospital due to his illness. However, per documented wt hx, pt with history of wt gain.   Discussed importance of good meal and supplement intake to promote healing. Pt amenable to nutritional supplements.   Per therapy  notes, recommending SNF at this time.   Medications reviewed and include prednisone.   Albumin has a half-life of 21 days and is strongly affected by stress response and inflammatory process, therefore, do not expect to see an improvement in this lab value during acute hospitalization. When a patient presents with low albumin, it is likely skewed due to the acute inflammatory response.  Unless it is suspected that patient had poor PO intake or malnutrition prior to admission, then RD should not be consulted solely for low albumin. Note that low albumin is no longer used to diagnose malnutrition; [place] uses the new malnutrition guidelines published by the American Society for Parenteral and Enteral Nutrition (A.S.P.E.N.) and the Academy of Nutrition and Dietetics (AND).  RD unable to identify malnutrition at this time without completion of nutrition focused physical exam, however, highly suspect malnutrition secondary to homelessness and multiple comorbidities.   Labs reviewed: CBGS: 151.   Diet Order:   Diet Order            DIET DYS 3 Room service appropriate? Yes with Assist; Fluid consistency: Thin  Diet effective now              EDUCATION NEEDS:   Education needs have been addressed  Skin:  Skin Assessment: Skin Integrity Issues: Skin Integrity Issues:: Other (Comment) Other: MASD to bilateral feet, rash to abdomen and bilateral thighs  Last BM:  08/05/18  Height:   Ht Readings from Last 1 Encounters:  07/29/18 5\' 7"  (1.702 m)  Weight:   Wt Readings from Last 1 Encounters:  08/05/18 67.7 kg    Ideal Body Weight:  67.3 kg  BMI:  Body mass index is 23.38 kg/m.  Estimated Nutritional Needs:   Kcal:  2000-2200  Protein:  100-115 grams  Fluid:  > 2.0 L    Marina Boerner A. Jimmye Norman, RD, LDN, [place] Registered Dietitian II Certified Diabetes Care and Education Specialist Pager: [phone] After hours Pager: [phone]

## 2018-08-05 NOTE — Progress Notes (Signed)
Physical Therapy Treatment Patient Details Name: Trevor Williams MRN: [id number] DOB: [birth year] Today's Date: 08/05/2018    History of Present Illness Patient is a 58 y.o. male with PMH: ETOH abuse, active tobacco user, COPD, empysema, skin cancer, who was admitted for dyspnea, wheezing and hypoxia. He developed alcohol withdrawl, respiratory failure and right lower lung infiltrate and was intubated on 5/12 and extubated on 5/16 in AM.     PT Comments    Patient seen for mobility progression. Continued weakness at B LE with unsteady transfers and gait, even with +2 HHA for safety and balance. Patient currently requiring supplemental O2 at this time as patient desats on RA. Will continue to recommend CIR at discharge. Will continue to follow   Follow Up Recommendations  CIR     Equipment Recommendations  Rolling walker with 5" wheels;3in1 (PT)    Recommendations for Other Services       Precautions / Restrictions Precautions Precautions: Fall Precaution Comments: High fall risk, x1 fall since admission Restrictions Weight Bearing Restrictions: No    Mobility  Bed Mobility Overal bed mobility: Needs Assistance Bed Mobility: Sit to Supine       Sit to supine: Min guard   General bed mobility comments: minguard for safety, increased time and effort  Transfers Overall transfer level: Needs assistance Equipment used: 1 person hand held assist;2 person hand held assist Transfers: Sit to/from Stand Sit to Stand: Mod assist;+2 safety/equipment         General transfer comment: assist to rise and steady; pt seeking bilateral handheld assist once he started taking steps; performed sit<>stand x2 from EOB  Ambulation/Gait Ambulation/Gait assistance: Min assist;+2 physical assistance Gait Distance (Feet): (10 then 20) Assistive device: 2 person hand held assist Gait Pattern/deviations: Step-to pattern;Step-through pattern;Decreased stride length;Trunk flexed;Drifts  right/left Gait velocity: slow   General Gait Details: unsteady gait pattern; pateint reports feeling of instability and weakness at LE; requires physical asssit to maintain balance   Stairs             Wheelchair Mobility    Modified Rankin (Stroke Patients Only)       Balance Overall balance assessment: Needs assistance Sitting-balance support: Bilateral upper extremity supported Sitting balance-Leahy Scale: Fair     Standing balance support: Bilateral upper extremity supported Standing balance-Leahy Scale: Poor Standing balance comment: reliant on UE/external assist for standing balance                            Cognition Arousal/Alertness: Awake/alert Behavior During Therapy: WFL for tasks assessed/performed Overall Cognitive Status: No family/caregiver present to determine baseline cognitive functioning Area of Impairment: Safety/judgement;Awareness                         Safety/Judgement: Decreased awareness of safety;Decreased awareness of deficits Awareness: Emergent   General Comments: overall appears close to [facility] for basic tasks today, following commands throughout session however noted pt with fall last PM due to pt up without assist; will continue to assess       Exercises      General Comments General comments (skin integrity, edema, etc.): pt trialled on RA with SpO2 down to 73% with standing activity, reapplied 2L O2 and SpO2 maintaining >90% with additional standing activity       Pertinent Vitals/Pain Pain Assessment: Faces Faces Pain Scale: Hurts little more Pain Location: Head Pain Descriptors / Indicators: Headache Pain Intervention(s): Limited activity within  patient's tolerance;Monitored during session;Repositioned    Home Living Family/patient expects to be discharged to:: Shelter/Homeless Living Arrangements: Other (Comment)(Tent in the woods)                  Prior Function Level of Independence:  Independent          PT Goals (current goals can now be found in the care plan section) Acute Rehab PT Goals Patient Stated Goal: agreeable to further rehab/therapies after discharge PT Goal Formulation: With patient Time For Goal Achievement: 08/18/18 Potential to Achieve Goals: Good Progress towards PT goals: Progressing toward goals    Frequency    Min 3X/week      PT Plan Current plan remains appropriate    Co-evaluation PT/OT/SLP Co-Evaluation/Treatment: Yes Reason for Co-Treatment: For patient/therapist safety;To address functional/ADL transfers;Necessary to address cognition/behavior during functional activity PT goals addressed during session: Mobility/safety with mobility;Balance;Strengthening/ROM OT goals addressed during session: ADL's and self-care      AM-PAC PT "6 Clicks" Mobility   Outcome Measure  Help needed turning from your back to your side while in a flat bed without using bedrails?: A Little Help needed moving from lying on your back to sitting on the side of a flat bed without using bedrails?: A Lot Help needed moving to and from a bed to a chair (including a wheelchair)?: A Lot Help needed standing up from a chair using your arms (e.g., wheelchair or bedside chair)?: A Lot Help needed to walk in hospital room?: A Lot Help needed climbing 3-5 steps with a railing? : A Lot 6 Click Score: 13    End of Session Equipment Utilized During Treatment: Gait belt;Oxygen Activity Tolerance: Patient tolerated treatment well Patient left: in bed;with call bell/phone within reach;with bed alarm set Nurse Communication: Mobility status PT Visit Diagnosis: Unsteadiness on feet (R26.81);Muscle weakness (generalized) (M62.81);History of falling (Z91.81);Ataxic gait (R26.0);Difficulty in walking, not elsewhere classified (R26.2)     Time: 2951-8841 PT Time Calculation (min) (ACUTE ONLY): 26 min  Charges:  $Gait Training: 8-22 mins                       Lanney Gins, PT, DPT Supplemental Physical Therapist 08/05/18 10:31 AM Pager: [phone] Office: [phone]

## 2018-08-05 NOTE — TOC Progression Note (Signed)
Transition of Care [facility]) - Progression Note    Patient Details  Name: Trevor Williams MRN: [id number] Date of Birth: January 17, 1961  Transition of Care [facility]) CM/SW [place], [place] Phone Number: 08/05/2018, 2:41 PM  Clinical Narrative:     RN and CSW met with the patient at bedside. The patient is agreeable to SNF. CSW explained that the patient would require and LOG for SNF stay. He is agreeable. Patient reaffirmed that he does not have any assets.   CSW called and left a message for Shanon Rosser with financial counselors to determine if the patient's disability and medicaid applications have been started.   CSW called Nathaniel Man, Water engineer about the patient to discuss LOG. CSW is awaiting a return phone call from [place] AD.   CSW will continue to assist with making SNF placement.    Expected Discharge Plan: [place] Barriers to Discharge: Continued Medical Work up, SNF Pending payor source - LOG  Expected Discharge Plan and Services Expected Discharge Plan: [place] In-house Referral: Clinical Social Work Discharge Planning Services: NA Post Acute Care Choice: [place] Living arrangements for the past 2 months: Homeless                 DME Arranged: N/A DME Agency: NA       HH Arranged: NA HH Agency: NA         Social Determinants of Health (SDOH) Interventions    Readmission Risk Interventions No flowsheet data found.

## 2018-08-05 NOTE — Evaluation (Signed)
Occupational Therapy Evaluation Patient Details Name: Trevor Williams MRN: [id number] DOB: October 06, 1960 Today's Date: 08/05/2018    History of Present Illness Patient is a 58 y.o. male with PMH: ETOH abuse, active tobacco user, COPD, empysema, skin cancer, who was admitted for dyspnea, wheezing and hypoxia. He developed alcohol withdrawl, respiratory failure and right lower lung infiltrate and was intubated on 5/12 and extubated on 5/16 in AM.    Clinical Impression   This 58 y/o male presents with the above. PTA pt reports he was independent with ADL and functional mobility. Pt currently presenting with generalized weakness, decreased standing balance and poor activity tolerance. Pt requiring two person HHA for completion of room level mobility; requires minA for standing grooming and seated UB ADL, modA for LB ADL. Pt trialled on RA with lowest SpO2 noted 73% with standing activity, reapplied 2L O2 and SpO2 maintaining >90% with additional activity. Noted pt is homeless and with decreased caregiver support at time of discharge, however suspect pt may be able to reach mod independent level with additional therapy services prior to return home and Feel he is appropriate for CIR level services. Will continue to follow acutely.     Follow Up Recommendations  CIR;Supervision/Assistance - 24 hour    Equipment Recommendations  Other (comment)(TBA)           Precautions / Restrictions Precautions Precautions: Fall Precaution Comments: High fall risk, x1 fall since admission Restrictions Weight Bearing Restrictions: No      Mobility Bed Mobility Overal bed mobility: Needs Assistance Bed Mobility: Sit to Supine       Sit to supine: Min guard   General bed mobility comments: minguard for safety, increased time and effort  Transfers Overall transfer level: Needs assistance Equipment used: 1 person hand held assist;2 person hand held assist Transfers: Sit to/from Stand Sit to Stand: Mod  assist;+2 safety/equipment         General transfer comment: assist to rise and steady; pt seeking bilateral handheld assist once he started taking steps; performed sit<>stand x2 from EOB    Balance Overall balance assessment: Needs assistance Sitting-balance support: Bilateral upper extremity supported Sitting balance-Leahy Scale: Fair     Standing balance support: Bilateral upper extremity supported Standing balance-Leahy Scale: Poor Standing balance comment: reliant on UE/external assist for standing balance                           ADL either performed or assessed with clinical judgement   ADL Overall ADL's : Needs assistance/impaired Eating/Feeding: Set up;Sitting Eating/Feeding Details (indicate cue type and reason): pt finishing breakfast upon arrival to room Grooming: Minimal assistance;Moderate assistance;Standing;Wash/dry hands;Wash/dry face;Brushing hair Grooming Details (indicate cue type and reason): assist for standing balance, pt noted to have increased LE instability with prolonged standing Upper Body Bathing: Min guard;Sitting   Lower Body Bathing: Minimal assistance;Moderate assistance;Sit to/from stand   Upper Body Dressing : Min guard;Set up;Sitting   Lower Body Dressing: Moderate assistance;Sit to/from stand   Toilet Transfer: Moderate assistance;+2 for physical assistance;+2 for safety/equipment;Ambulation Toilet Transfer Details (indicate cue type and reason): simulated via transfer to/from EOB Toileting- Clothing Manipulation and Hygiene: Moderate assistance;Sit to/from stand       Functional mobility during ADLs: Moderate assistance;+2 for physical assistance;+2 for safety/equipment General ADL Comments: pt with increased weakness, decreased standing balance, decreased endurance with activity requiring seated rest     Vision         Perception  Praxis      Pertinent Vitals/Pain Pain Assessment: Faces Faces Pain Scale: Hurts  little more Pain Location: Head Pain Descriptors / Indicators: Headache Pain Intervention(s): Patient requesting pain meds-RN notified;Monitored during session     Hand Dominance     Extremity/Trunk Assessment Upper Extremity Assessment Upper Extremity Assessment: Generalized weakness   Lower Extremity Assessment Lower Extremity Assessment: Defer to PT evaluation       Communication Communication Communication: No difficulties   Cognition Arousal/Alertness: Awake/alert Behavior During Therapy: WFL for tasks assessed/performed Overall Cognitive Status: No family/caregiver present to determine baseline cognitive functioning Area of Impairment: Safety/judgement;Awareness                         Safety/Judgement: Decreased awareness of safety;Decreased awareness of deficits Awareness: Emergent   General Comments: overall appears close to [facility] for basic tasks today, following commands throughout session however noted pt with fall last PM due to pt up without assist; will continue to assess    General Comments  pt trialled on RA with SpO2 down to 73% with standing activity, reapplied 2L O2 and SpO2 maintaining >90% with additional standing activity     Exercises     Shoulder Instructions      Home Living Family/patient expects to be discharged to:: Shelter/Homeless Living Arrangements: Other (Comment)(Tent in the woods)                                      Prior Functioning/Environment Level of Independence: Independent                 OT Problem List: Decreased strength;Decreased range of motion;Decreased activity tolerance;Impaired balance (sitting and/or standing);Decreased safety awareness;Cardiopulmonary status limiting activity;Decreased knowledge of use of DME or AE;Decreased cognition      OT Treatment/Interventions: Self-care/ADL training;Therapeutic exercise;Neuromuscular education;DME and/or AE instruction;Therapeutic  activities;Patient/family education;Balance training;Energy conservation    OT Goals(Current goals can be found in the care plan section) Acute Rehab OT Goals Patient Stated Goal: agreeable to further rehab/therapies after discharge OT Goal Formulation: With patient Time For Goal Achievement: 08/19/18 Potential to Achieve Goals: Good  OT Frequency: Min 2X/week   Barriers to D/C:            Co-evaluation PT/OT/SLP Co-Evaluation/Treatment: Yes Reason for Co-Treatment: For patient/therapist safety;Necessary to address cognition/behavior during functional activity;To address functional/ADL transfers   OT goals addressed during session: ADL's and self-care      AM-PAC OT "6 Clicks" Daily Activity     Outcome Measure Help from another person eating meals?: None Help from another person taking care of personal grooming?: A Little Help from another person toileting, which includes using toliet, bedpan, or urinal?: A Lot Help from another person bathing (including washing, rinsing, drying)?: A Lot Help from another person to put on and taking off regular upper body clothing?: A Little Help from another person to put on and taking off regular lower body clothing?: A Lot 6 Click Score: 16   End of Session Equipment Utilized During Treatment: Gait belt;Oxygen Nurse Communication: Mobility status  Activity Tolerance: Patient tolerated treatment well Patient left: in bed;with call bell/phone within reach;with bed alarm set;with nursing/sitter in room(telesitter)  OT Visit Diagnosis: Muscle weakness (generalized) (M62.81);Unsteadiness on feet (R26.81)                Time: 6301-6010 OT Time Calculation (min): 26 min Charges:  OT  General Charges $OT Visit: 1 Visit OT Evaluation $OT Eval Moderate Complexity: 1 Mod  Lou Cal, OT [employer] Pager [phone] Office [place] 08/05/2018, 10:26 AM

## 2018-08-05 NOTE — Progress Notes (Signed)
PROGRESS NOTE  Trevor Williams ZOX:[id number] DOB: 1960/08/06 DOA: 07/28/2018 PCP: Marliss Coots, NP   LOS: 7 days   Patient is from: Homeless.  Lives alone.  No assistive device at baseline.  Brief Narrative / Interim history: 58 year old male with history of tobacco use disorder (40-pack-year history), alcohol use disorder and skin cancer presenting with progressive shortness of breath and DOE for 1 months.  Has had intermittent chest pain.  EMS called and patient was placed on nonrebreather, given epinephrine, Solu-Medrol, magnesium and albuterol and brought to ED.  In ED, patient was wheezing and hypoxemic.  ABG seven-point 3/46/47/23.  BNP elevated to 412 (429 previously).  CRP less than 0.8.  CBC with macrocytic anemia at the baseline, and thrombocytopenia to 40 (about baseline).  Troponin mildly.  EKG NSR with possible lateral wall ischemia.  D-dimer was elevated.  CTA chest was obtained and showed multifocal pneumonia and changes consistent with emphysema and possible CAD.  COVID-19 test was negative.  Patient was admitted for acute respiratory failure with hypoxemia secondary to pneumonia and possible COPD exacerbation.   Next day, he desaturated to mid 77s and developed alcohol withdrawal and transferred to ICU.  He was intubated on 5/12 and extubated on 5/16.  Put on Precedex for alcohol withdrawal.  Treated with broad-spectrum antibiotics for pneumonia.  TRH reassumed his care on 5/17  Subjective: No major events overnight.  He says he did have good sleep.  Reports improvement in his breathing.  Denies chest pain.  Currently saturating in 90s on 2 L by nasal cannula.  Started on Lasix.  Urine output was not captured in epic.  Weight is down.  Leukocytosis resolved.  Assessment & Plan: Acute respiratory failure with hypoxemia: Multifactorial including pneumonia, COPD exacerbation and possible CHF exacerbation.  Initial ABG with compensated respiratory acidosis and hypoxemia.   Improving.  Currently on 2 L by nasal cannula. -Wean oxygen as able -We will ambulate -Treated treatable causes as below -PT/OT-CIR   Community-acquired pneumonia: Noted on CTA chest.  Completed course of antibiotics.  COVID-19 negative x2.  RVP negative.  Blood and sputum cultures not impressive.  Leukocytosis resolved. -Rocephin 5/10 >> 5/16 -Zithromax 5/10 >> 5/13 -Vancomycin 5/12 >> 5/14 -Doxycycline 5/13 >> 5/15 -Levaquin 5/15 >>5/16  -Incentive spirometry -Mucolytic's  COPD exacerbation: No formal diagnosis but 40-pack-year history.  Current smoker.  Not on treatment at home. -Start LAMA/LABA-Anoro Ellipta -Solu-Medrol through 5/18 -PRN DuoNeb  Possible CHF exacerbation: presented with worsening dyspnea on exertion for months.  BNP elevated but down trended.  Never had echocardiogram.  Started on IV Lasix.  Urine output not captured in epic.  Weight is down.  Breathing improved.  Slight uptrend in renal function. -Follow echocardiogram -Continue IV Lasix 20 mg twice daily -Daily weight, intake output and renal function -Closely monitor electrolytes.  Hyperglycemia: No history of diabetes.  A1c 5.3%.  No diagnosis of diabetes. -Discontinue CBG monitoring -Heart healthy diet.  Macrocytic anemia: Hemoglobin at baseline.  Anemia panel consistent with anemia of chronic disease. -Monitor as needed  Alcohol use disorder/alcohol withdrawal: reports drinking about half a pint of liquor a day -ETT 5/12-5/16 -Cessation counseling -Continue multivitamins thiamine and folic acid -Monitor electrolytes  Tobacco use disorder: Current smoker about a pack a day.  40-pack-year history. -Cessation counseling -Nicotine patch  Thrombocytopenia: Likely due to alcohol.  Better than baseline during this hospitalization.  Severe protein calorie malnutrition: BMI 23.86.  Likely due to acute on chronic illness, and alcohol use disorder. -Appreciate nutrition  input.  We will continue  supplements  Social situation: Patient is homeless, no PCP. 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  Scheduled Meds: . Chlorhexidine Gluconate Cloth  6 each Topical Q0600  . clonazePAM  1 mg Oral Q8H  . famotidine  20 mg Oral BID  . feeding supplement (PRO-STAT SUGAR FREE 64)  30 mL Oral BID  . folic acid  1 mg Oral Daily  . furosemide  20 mg Intravenous BID  . guaiFENesin  5 mL Oral BID  . ipratropium-albuterol  3 mL Nebulization Q6H  . mouth rinse  15 mL Mouth Rinse BID  . methylPREDNISolone (SOLU-MEDROL) injection  40 mg Intravenous Q6H  . multivitamin with minerals  1 tablet Oral Daily  . nicotine  21 mg Transdermal Daily  . thiamine  100 mg Oral Daily   Continuous Infusions: . sodium chloride 10 mL/hr at 08/01/18 2200  . feeding supplement (OSMOLITE 1.2 CAL) Stopped (08/03/18 1900)  . lactated ringers Stopped (08/03/18 1608)   PRN Meds:.acetaminophen, albuterol, bisacodyl, docusate, fentaNYL, midazolam   DVT prophylaxis: SCD due to thrombocytopenia Code Status: Full code Family Communication: Patient has no family or close relatives Disposition Plan: Remains inpatient.  Still on oxygen.  Anticipate discharge in the next 24 to 48 hours likely to CIR if he qualifies.   Consultants:   PCCM  Procedures:   ETT 5/12-5/16  Microbiology: COVID (in house) 5/10 >> negative RVP 5/10 >> negative Blood 5/10 >> negative COVID (send out) 5/11 >> negative Sputum 5/12 >> normal flora  Antimicrobials: Anti-infectives (From admission, onward)   Start     Dose/Rate Route Frequency Ordered Stop   08/02/18 1000  levofloxacin (LEVAQUIN) 25 MG/ML solution 750 mg     750 mg Per Tube Daily 08/02/18 0918 08/03/18 0944   08/01/18 0900  doxycycline (VIBRAMYCIN) 100 mg in sodium chloride 0.9 % 250 mL IVPB  Status:  Discontinued     100 mg 125 mL/hr over 120 Minutes Intravenous Every 12 hours 08/01/18 0837 08/02/18 0846   07/31/18 2100  doxycycline (VIBRAMYCIN) 100 mg in sodium chloride 0.9 %  250 mL IVPB  Status:  Discontinued     100 mg 125 mL/hr over 120 Minutes Intravenous Every 12 hours 07/31/18 2002 08/01/18 0837   07/31/18 0200  vancomycin (VANCOCIN) IVPB 750 mg/150 ml premix  Status:  Discontinued     750 mg 150 mL/hr over 60 Minutes Intravenous Every 12 hours 07/30/18 0834 08/01/18 0837   07/30/18 1000  cefTRIAXone (ROCEPHIN) 2 g in sodium chloride 0.9 % 100 mL IVPB  Status:  Discontinued     2 g 200 mL/hr over 30 Minutes Intravenous Every 24 hours 07/30/18 0832 08/02/18 0846   07/30/18 0845  vancomycin (VANCOCIN) 1,250 mg in sodium chloride 0.9 % 250 mL IVPB     1,250 mg 166.7 mL/hr over 90 Minutes Intravenous  Once 07/30/18 0831 07/30/18 1809   07/29/18 2000  cefTRIAXone (ROCEPHIN) 1 g in sodium chloride 0.9 % 100 mL IVPB  Status:  Discontinued     1 g 200 mL/hr over 30 Minutes Intravenous Every 24 hours 07/29/18 0114 07/30/18 0832   07/29/18 2000  azithromycin (ZITHROMAX) 500 mg in sodium chloride 0.9 % 250 mL IVPB  Status:  Discontinued     500 mg 250 mL/hr over 60 Minutes Intravenous Every 24 hours 07/29/18 0114 07/31/18 2002   07/28/18 2215  cefTRIAXone (ROCEPHIN) 1 g in sodium chloride 0.9 % 100 mL IVPB     1 g  200 mL/hr over 30 Minutes Intravenous  Once 07/28/18 2203 07/28/18 2326   07/28/18 2215  azithromycin (ZITHROMAX) 500 mg in sodium chloride 0.9 % 250 mL IVPB     500 mg 250 mL/hr over 60 Minutes Intravenous  Once 07/28/18 2203 07/28/18 2354      Objective: Vitals:   08/05/18 0500 08/05/18 0545 08/05/18 0726 08/05/18 0736  BP: (!) 142/94 (!) 142/94  134/90  Pulse: (!) 106   (!) 107  Resp: 20 20    Temp: 98.2 F (36.8 C) 98.2 F (36.8 C)  98.7 F (37.1 C)  TempSrc: Oral Oral  Oral  SpO2: 94% 94% 90% 94%  Weight:      Height:        Intake/Output Summary (Last 24 hours) at 08/05/2018 1242 Last data filed at 08/05/2018 1100 Gross per 24 hour  Intake 360 ml  Output -  Net 360 ml   Filed Weights   08/03/18 0327 08/04/18 0600 08/05/18 0300   Weight: 71 kg 69.1 kg 67.7 kg    Examination:  GENERAL: No acute distress.  Appears well.  HEENT: MMM.  Vision and hearing grossly intact.  NECK: Supple.  No apparent JVD. LUNGS:  No IWOB.  Fair air movement bilaterally. HEART:  RRR. Heart sounds normal.  ABD: Bowel sounds present. Soft. Non tender.  MSK/EXT:  Moves all extremities. No apparent deformity. No edema bilaterally.  SKIN: no apparent skin lesion or wound NEURO: Awake, alert and oriented appropriately.  No gross deficit.  PSYCH: Calm. Normal affect.  Data Reviewed: I have independently reviewed following labs and imaging studies  CBC: Recent Labs  Lab 08/01/18 0324 08/02/18 0216 08/03/18 0255 08/04/18 0450 08/05/18 0606  WBC 5.8 6.3 6.5 14.3* 8.9  NEUTROABS  --  5.0  --   --   --   HGB 9.2* 9.7* 10.0* 9.9* 9.4*  HCT 28.1* 29.5* 30.3* 29.5* 28.4*  MCV 113.8* 112.6* 111.4* 110.9* 111.8*  PLT 32* 42* 63* 90* 130*   Basic Metabolic Panel: Recent Labs  Lab 07/30/18 1759 07/31/18 0412 07/31/18 1634 08/01/18 0324 08/02/18 0216 08/03/18 0255 08/04/18 0450 08/05/18 0606  NA  --  141  --  140 138 137 138 141  K  --  3.6  --  4.1 4.2 4.1 3.8 3.5  CL  --  109  --  109 109 107 107 106  CO2  --  23  --  22 24 23 22 25   GLUCOSE  --  71  --  163* 133* 227* 123* 172*  BUN  --  18  --  15 11 15 17 20   CREATININE  --  0.79  --  0.74 0.69 0.68 0.77 0.93  CALCIUM  --  7.9*  --  8.0* 7.8* 8.1* 8.6* 8.3*  MG 2.1 2.1 2.0  --  1.8  --  2.0  --   PHOS 3.5 2.4* 2.8  --  2.9  --  4.3  --    GFR: Estimated Creatinine Clearance: 81.9 mL/min (by C-G formula based on SCr of 0.93 mg/dL). Liver Function Tests: Recent Labs  Lab 07/30/18 0607 07/31/18 0412  AST 152* 71*  ALT 68* 53*  ALKPHOS 96 69  BILITOT 1.1 0.8  PROT 7.0 6.1*  ALBUMIN 3.5 2.9*   No results for input(s): LIPASE, AMYLASE in the last 168 hours. No results for input(s): AMMONIA in the last 168 hours. Coagulation Profile: No results for input(s): INR,  PROTIME in the last 168 hours.  Cardiac Enzymes: Recent Labs  Lab 07/29/18 1418  TROPONINI 0.03*   BNP (last 3 results) No results for input(s): PROBNP in the last 8760 hours. HbA1C: Recent Labs    08/05/18 0606  HGBA1C 5.3   CBG: Recent Labs  Lab 08/04/18 0713 08/04/18 1223 08/04/18 1653 08/05/18 0734 08/05/18 1150  GLUCAP 113* 131* 151* 151* 183*   Lipid Profile: No results for input(s): CHOL, HDL, LDLCALC, TRIG, CHOLHDL, LDLDIRECT in the last 72 hours. Thyroid Function Tests: No results for input(s): TSH, T4TOTAL, FREET4, T3FREE, THYROIDAB in the last 72 hours. Anemia Panel: No results for input(s): VITAMINB12, FOLATE, FERRITIN, TIBC, IRON, RETICCTPCT in the last 72 hours. Urine analysis:    Component Value Date/Time   COLORURINE YELLOW 06/01/2018 1820   APPEARANCEUR CLEAR 06/01/2018 1820   LABSPEC 1.014 06/01/2018 1820   PHURINE 7.0 06/01/2018 1820   GLUCOSEU NEGATIVE 06/01/2018 1820   HGBUR NEGATIVE 06/01/2018 1820   [place] NEGATIVE 06/01/2018 [place] 06/01/2018 1820   PROTEINUR 30 (A) 06/01/2018 1820   NITRITE NEGATIVE 06/01/2018 1820   LEUKOCYTESUR NEGATIVE 06/01/2018 1820   Sepsis Labs: Invalid input(s): PROCALCITONIN, LACTICIDVEN  Recent Results (from the past 240 hour(s))  SARS Coronavirus 2 [facility] order, Performed in [facility] hospital lab)     Status: None   Collection Time: 07/28/18  8:36 PM  Result Value Ref Range Status   SARS Coronavirus 2 NEGATIVE NEGATIVE Final    Comment: (NOTE) If result is NEGATIVE SARS-CoV-2 target nucleic acids are NOT DETECTED. The SARS-CoV-2 RNA is generally detectable in upper and lower  respiratory specimens during the acute phase of infection. The lowest  concentration of SARS-CoV-2 viral copies this assay can detect is 250  copies / mL. A negative result does not preclude SARS-CoV-2 infection  and should not be used as the sole basis for treatment or other  patient management  decisions.  A negative result may occur with  improper specimen collection / handling, submission of specimen other  than nasopharyngeal swab, presence of viral mutation(s) within the  areas targeted by this assay, and inadequate number of viral copies  (<250 copies / mL). A negative result must be combined with clinical  observations, patient history, and epidemiological information. If result is POSITIVE SARS-CoV-2 target nucleic acids are DETECTED. The SARS-CoV-2 RNA is generally detectable in upper and lower  respiratory specimens dur ing the acute phase of infection.  Positive  results are indicative of active infection with SARS-CoV-2.  Clinical  correlation with patient history and other diagnostic information is  necessary to determine patient infection status.  Positive results do  not rule out bacterial infection or co-infection with other viruses. If result is PRESUMPTIVE POSTIVE SARS-CoV-2 nucleic acids MAY BE PRESENT.   A presumptive positive result was obtained on the submitted specimen  and confirmed on repeat testing.  While 2019 novel coronavirus  (SARS-CoV-2) nucleic acids may be present in the submitted sample  additional confirmatory testing may be necessary for epidemiological  and / or clinical management purposes  to differentiate between  SARS-CoV-2 and other Sarbecovirus currently known to infect humans.  If clinically indicated additional testing with an alternate test  methodology [phone]) is advised. The SARS-CoV-2 RNA is generally  detectable in upper and lower respiratory sp ecimens during the acute  phase of infection. The expected result is Negative. Fact Sheet for Patients:  StrictlyIdeas.no Fact Sheet for Healthcare Providers: BankingDealers.co.za This test is not yet approved or cleared by the [place]  FDA and has been authorized for detection and/or diagnosis of SARS-CoV-2 by FDA under an  Emergency Use Authorization (EUA).  This EUA will remain in effect (meaning this test can be used) for the duration of the COVID-19 declaration under Section 564(b)(1) of the Act, 21 U.S.C. section 360bbb-3(b)(1), unless the authorization is terminated or revoked sooner. Performed at [facility], [place] [address] [place], [place] [address]   Novel Coronavirus,NAA,(SEND-OUT TO REF LAB - TAT 24-48 hrs); Hosp Order     Status: None   Collection Time: 07/28/18  8:36 PM  Result Value Ref Range Status   SARS-CoV-2, NAA NOT DETECTED NOT DETECTED Final    Comment: (NOTE) This test was developed and its performance characteristics determined by [employer]. This test has not been FDA cleared or approved. This test has been authorized by FDA under an Emergency Use Authorization (EUA). This test is only authorized for the duration of time the declaration that circumstances exist justifying the authorization of the emergency use of in vitro diagnostic tests for detection of SARS-CoV-2 virus and/or diagnosis of COVID-19 infection under section 564(b)(1) of the Act, 21 U.S.C. 191YNW-2(N)(5), unless the authorization is terminated or revoked sooner. When diagnostic testing is negative, the possibility of a false negative result should be considered in the context of a patient's recent exposures and the presence of clinical signs and symptoms consistent with COVID-19. An individual without symptoms of COVID-19 and who is not shedding SARS-CoV-2 virus would expect to have a negative (not detected) result in this assay. Performed  At: [facility] [address] [place], [place] [id number] Rush Farmer MD QI:[id number]    [place]  Final    Comment: Performed at [facility], [place] [address], [place], [place] [address]  Respiratory Panel by PCR     Status: None   Collection Time: 07/28/18  8:36 PM  Result Value Ref Range Status   Adenovirus  NOT DETECTED NOT DETECTED Final   Coronavirus 229E NOT DETECTED NOT DETECTED Final    Comment: (NOTE) The Coronavirus on the Respiratory Panel, DOES NOT test for the novel  Coronavirus (2019 nCoV)    Coronavirus HKU1 NOT DETECTED NOT DETECTED Final   Coronavirus NL63 NOT DETECTED NOT DETECTED Final   Coronavirus OC43 NOT DETECTED NOT DETECTED Final   Metapneumovirus NOT DETECTED NOT DETECTED Final   Rhinovirus / Enterovirus NOT DETECTED NOT DETECTED Final   Influenza A NOT DETECTED NOT DETECTED Final   Influenza B NOT DETECTED NOT DETECTED Final   Parainfluenza Virus 1 NOT DETECTED NOT DETECTED Final   Parainfluenza Virus 2 NOT DETECTED NOT DETECTED Final   Parainfluenza Virus 3 NOT DETECTED NOT DETECTED Final   Parainfluenza Virus 4 NOT DETECTED NOT DETECTED Final   Respiratory Syncytial Virus NOT DETECTED NOT DETECTED Final   Bordetella pertussis NOT DETECTED NOT DETECTED Final   Chlamydophila pneumoniae NOT DETECTED NOT DETECTED Final   Mycoplasma pneumoniae NOT DETECTED NOT DETECTED Final    Comment: Performed at [facility], [place]. [address], [place], [place] [address]  Blood culture (routine x 2)     Status: None   Collection Time: 07/28/18 10:25 PM  Result Value Ref Range Status   Specimen Description BLOOD RIGHT UPPER ARM  Final   Special Requests   Final    BOTTLES DRAWN AEROBIC AND ANAEROBIC Blood Culture adequate volume   Culture   Final    NO GROWTH 5 DAYS Performed at [facility], [address], [place], [place] [address]    Report Status 08/02/2018 FINAL  Final  Blood culture (routine x 2)     Status: None   Collection Time: 07/28/18 10:36 PM  Result Value Ref Range Status   Specimen Description BLOOD RIGHT ARM  Final   Special Requests   Final    BOTTLES DRAWN AEROBIC AND ANAEROBIC Blood Culture adequate volume   Culture   Final    NO GROWTH 5 DAYS Performed at [facility], [place] [address] [place], [place] [address]    Report Status  08/02/2018 FINAL  Final  Culture, sputum-assessment     Status: None   Collection Time: 07/29/18  3:14 PM  Result Value Ref Range Status   Specimen Description EXPECTORATED SPUTUM  Final   Special Requests NONE  Final   Sputum evaluation   Final    Sputum specimen not acceptable for testing.  Please recollect.   RESULT CALLED TO, READ BACK BY AND VERIFIED WITH: Brennan Bailey RN [id number] 07/29/18 A BROWNING Performed at [facility], [place] [address] [place], [place] [address]    Report Status 07/29/2018 FINAL  Final  MRSA PCR Screening     Status: None   Collection Time: 07/30/18 10:00 AM  Result Value Ref Range Status   MRSA by PCR NEGATIVE NEGATIVE Final    Comment:        The GeneXpert MRSA Assay (FDA approved for NASAL specimens only), is one component of a comprehensive MRSA colonization surveillance program. It is not intended to diagnose MRSA infection nor to guide or monitor treatment for MRSA infections. Performed at [facility], [place] [address] [place], [place] [address]   Culture, respiratory (non-expectorated)     Status: None   Collection Time: 07/30/18 10:21 AM  Result Value Ref Range Status   Specimen Description TRACHEAL ASPIRATE  Final   Special Requests NONE  Final   Gram Stain NO WBC SEEN NO ORGANISMS SEEN   Final   Culture   Final    RARE Consistent with normal respiratory flora. Performed at [facility], [place] [address] [place], [place] [address]    Report Status 08/01/2018 FINAL  Final      Radiology Studies: No results found.  Lynisha Osuch [facility] Pager [phone]  If 7PM-7AM, please contact night-coverage [email or web address] Password [facility] 08/05/2018, 12:42 PM

## 2018-08-05 NOTE — Progress Notes (Signed)
Inpatient Rehabilitation Admissions Coordinator  Inpatient Rehab Consult received. I met with patient at the bedside for rehabilitation assessment. [place]. I recommend SNF rehab at this time for prolonged recuperation before returning to his prior living situation. I contacted RN CM, Neoma Laming and we will sign off at this time.  Danne Baxter, RN, MSN Rehab Admissions Coordinator [phone] 08/05/2018 12:48 PM

## 2018-08-05 NOTE — Progress Notes (Signed)
Patient had a witnessed fall at 0545, reported to have landed on knees.  Patient denies pain, there are no visible signs of injury from fall.  Patient was attempting to close his room door  despite repeated requests to not ambulate unassisted.  Bed alarm was set and alarmed prior to patient falling. Patient's RN and attending provider were made aware.

## 2018-08-05 NOTE — Progress Notes (Signed)
CSW received a phone call back from Williams, Nathaniel Man. He is willing to do an LOG for the patient if it can be completed by 5/19. Patient is subjective to improve if any more time passes. CSW will rely information to [facility].   CSW has called and left a message with FC so that they are able to start medicaid and disability applications.   CSW will continue to follow and assist with disposition.   Domenic Schwab, MSW, [place]

## 2018-08-05 NOTE — Progress Notes (Signed)
  Speech Language Pathology Treatment: Dysphagia  Patient Details Name: Trevor Williams MRN: [id number] DOB: March 06, 1961 Today's Date: 08/05/2018 Time: 1345-1400 SLP Time Calculation (min) (ACUTE ONLY): 15 min  Assessment / Plan / Recommendation Clinical Impression  Patient seen to address dysphagia goals with part of lunch meal (Dys 3, thin liquids). Patient did not exhibit any overt s/s of aspiration or penetration and able to feed self without assistance. Voice remained clear throughout session. Patient appears much better today as compared to initial evaluation on Saturday, during which he exhibited bilateral UE weakness and was unable to effectively feed self, as well as delayed coughing and congested voicing.   HPI HPI: Patient is a 58 y.o. male with PMH: ETOH abuse, active tobacco user, COPD, empysema, skin cancer, who was admitted for dyspnea, wheezing and hypoxia. He developed alcohol withdrawl, respiratory failure and right lower lung infiltrate and was intubated on 5/12 and extubated on 5/16 in AM.       SLP Plan  Continue with current plan of care       Recommendations  Diet recommendations: Dysphagia 3 (mechanical soft);Thin liquid Liquids provided via: Cup;Straw Medication Administration: Whole meds with liquid Supervision: Patient able to self feed;Intermittent supervision to cue for compensatory strategies Compensations: Minimize environmental distractions;Slow rate;Small sips/bites Postural Changes and/or Swallow Maneuvers: Seated upright 90 degrees                Oral Care Recommendations: Oral care BID Follow up Recommendations: Other (comment)(TBD, likely no SLP recommendation) SLP Visit Diagnosis: Dysphagia, unspecified (R13.10) Plan: Continue with current plan of care       GO                Dannial Monarch 08/05/2018, 3:57 PM   Sonia Baller, MA, [facility] Pager: [phone]

## 2018-08-06 LAB — BASIC METABOLIC PANEL
Anion gap: 13 (ref 5–15)
BUN: 25 mg/dL — ABNORMAL HIGH (ref 6–20)
CO2: 26 mmol/L (ref 22–32)
Calcium: 8.5 mg/dL — ABNORMAL LOW (ref 8.9–10.3)
Chloride: 104 mmol/L (ref 98–111)
Creatinine, Ser: 0.91 mg/dL (ref 0.61–1.24)
GFR calc Af Amer: >60 mL/min (ref >60)
GFR calc non Af Amer: >60 mL/min (ref >60)
Glucose, Bld: 137 mg/dL — ABNORMAL HIGH (ref 70–99)
Potassium: 3.9 mmol/L (ref 3.5–5.1)
Sodium: 143 mmol/L (ref 135–145)

## 2018-08-06 LAB — NOVEL CORONAVIRUS, NAA (HOSP ORDER, SEND-OUT TO REF LAB; TAT 18-24 HRS): SARS-CoV-2, NAA: NOT DETECTED

## 2018-08-06 LAB — GLUCOSE, CAPILLARY
Glucose-Capillary: 133 mg/dL — ABNORMAL HIGH (ref 70–99)
Glucose-Capillary: 127 mg/dL — ABNORMAL HIGH (ref 70–99)
Glucose-Capillary: 145 mg/dL — ABNORMAL HIGH (ref 70–99)
Glucose-Capillary: 93 mg/dL (ref 70–99)

## 2018-08-06 LAB — HEMOGLOBIN AND HEMATOCRIT, BLOOD
HCT: 27.7 % — ABNORMAL LOW (ref 39.0–52.0)
Hemoglobin: 9.2 g/dL — ABNORMAL LOW (ref 13.0–17.0)

## 2018-08-06 LAB — MAGNESIUM: Magnesium: 2 mg/dL (ref 1.7–2.4)

## 2018-08-06 MED ORDER — CLONAZEPAM 0.5 MG PO TABS
0.5000 mg | ORAL_TABLET | Freq: Two times a day (BID) | ORAL | Status: DC | PRN
  Administered 2018-08-06 – 2018-08-09 (×2): 0.5 mg via ORAL
  Filled 2018-08-06 (×2): qty 1

## 2018-08-06 MED ORDER — CLONAZEPAM 0.5 MG PO TABS: 0.5000 mg | ORAL_TABLET | Freq: Every day | ORAL | Status: DC

## 2018-08-06 NOTE — Progress Notes (Signed)
Per tech pt ambulated from bed to hallway and back on RA. O2 fluctuated very frequently during ambulation but per report did not go below 88%. Pt request to keep O2 for resting-oxygen decreased 1.5.

## 2018-08-06 NOTE — Progress Notes (Signed)
PROGRESS NOTE  Trevor Williams ERX:[id number] DOB: 17-Nov-1960 DOA: 07/28/2018 PCP: Marliss Coots, NP   LOS: 8 days   Patient is from: Homeless.  Lives alone.  No assistive device at baseline.  Brief Narrative / Interim history: 58 year old male with history of tobacco use disorder (40-pack-year history), alcohol use disorder and skin cancer presenting with progressive shortness of breath and DOE for 1 months.  Has had intermittent chest pain.  EMS called and patient was placed on nonrebreather, given epinephrine, Solu-Medrol, magnesium and albuterol and brought to ED.  In ED, patient was wheezing and hypoxemic.  ABG seven-point 3/46/47/23.  BNP elevated to 412 (429 previously).  CRP less than 0.8.  CBC with macrocytic anemia at the baseline, and thrombocytopenia to 40 (about baseline).  Troponin mildly.  EKG NSR with possible lateral wall ischemia.  D-dimer was elevated.  CTA chest was obtained and showed multifocal pneumonia and changes consistent with emphysema and possible CAD.  COVID-19 test was negative.  Patient was admitted for acute respiratory failure with hypoxemia secondary to pneumonia and possible COPD exacerbation.   Next day, he desaturated to mid 19s and developed alcohol withdrawal and transferred to ICU.  He was intubated on 5/12 and extubated on 5/16.  Put on Precedex for alcohol withdrawal.  Treated with broad-spectrum antibiotics for pneumonia.  TRH reassumed his care on 5/17  Subjective: Reportedly impulsive and disorientation needing intermittent redirections overnight.  Also some tremors, clamminess and urinary accidents overnight.  No complaints this morning.  He says his breathing has improved.  Denies pain anywhere at this time.  He is asking for his breakfast.  Oriented x4.  Assessment & Plan: Acute respiratory failure with hypoxemia: Multifactorial including pneumonia, COPD exacerbation and possible CHF exacerbation.  Initial ABG with compensated respiratory  acidosis and hypoxemia.  Improving.  Saturating in upper 90s on 2 L by [place]. -Wean oxygen as able and ambulate-personally requested his nurse -Treated treatable causes as below -PT/OT-CIR   Community-acquired pneumonia: Noted on CTA chest.  Completed course of antibiotics.  COVID-19 negative x2.  RVP negative.  Blood and sputum cultures not impressive.  Leukocytosis resolved. -Rocephin 5/10 >> 5/16 -Zithromax 5/10 >> 5/13 -Vancomycin 5/12 >> 5/14 -Doxycycline 5/13 >> 5/15 -Levaquin 5/15 >>5/16  -Incentive spirometry -Mucolytic's  COPD exacerbation: No formal diagnosis but 40-pack-year history.  Current smoker.  Not on treatment at home. -Start LAMA/LABA-Anoro Ellipta -Solu-Medrol through 5/18 -PRN DuoNeb  Possible CHF exacerbation: presented with worsening dyspnea on exertion for months.  BNP elevated but down trended.  Never had echocardiogram.  Started on IV Lasix.  Urine output not captured in epic partly because of incontinence.  Weight is down.  Breathing improved.  Function is stable.  Echocardiogram obtained on 5/17 and has not been read.  Read to Dr. Radford Pax for assistance but echo was not uploaded to read.  Called echo lab for assistance on this.  If not read today, will order another echocardiogram. -Follow echocardiogram -Continue IV Lasix 20 mg twice daily -Daily weight, intake output and renal function -Closely monitor electrolytes.  Hyperglycemia: No history of diabetes.  A1c 5.3%.  No diagnosis of diabetes. -Discontinue CBG monitoring -Heart healthy diet.  Macrocytic anemia: Hemoglobin at baseline.  Anemia panel consistent with anemia of chronic disease. -Monitor as needed  Alcohol use disorder/alcohol withdrawal: reports drinking about half a pint of liquor a day -ETT 5/12-5/16 -Cessation counseling -Continue multivitamins thiamine and folic acid -Monitor electrolytes  Tobacco use disorder: Current smoker about a pack a day.  40-pack-year history. -Cessation  counseling -Nicotine patch  Thrombocytopenia: Likely due to alcohol.  Better than baseline during this hospitalization.  Severe protein calorie malnutrition: BMI 23.86.  Likely due to acute on chronic illness, and alcohol use disorder. -Appreciate nutrition input.  We will continue supplements  Social situation: Patient is homeless, no PCP. 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  Delirium: impulsive and disoriented intermittently overnight.  -Reduce his Klonopin to 0.5 mg twice daily with a goal to take him of this. -Frequent reorientation -Tele-sitter  Scheduled Meds: . Chlorhexidine Gluconate Cloth  6 each Topical Q0600  . [START ON 08/07/2018] clonazePAM  0.5 mg Oral QHS  . famotidine  20 mg Oral BID  . feeding supplement (ENSURE ENLIVE)  237 mL Oral TID BM  . folic acid  1 mg Oral Daily  . furosemide  20 mg Intravenous BID  . guaiFENesin  5 mL Oral BID  . mouth rinse  15 mL Mouth Rinse BID  . multivitamin with minerals  1 tablet Oral Daily  . nicotine  21 mg Transdermal Daily  . thiamine  100 mg Oral Daily  . traZODone  50 mg Oral QHS  . umeclidinium-vilanterol  1 puff Inhalation Daily   Continuous Infusions: . sodium chloride 10 mL/hr at 08/01/18 2200  . lactated ringers Stopped (08/03/18 1608)   PRN Meds:.acetaminophen, bisacodyl, docusate, ipratropium-albuterol   DVT prophylaxis: SCD due to thrombocytopenia Code Status: Full code Family Communication: Patient has no family or close relatives Disposition Plan: Remains inpatient.  Still on oxygen and IV Lasix.  Anticipate discharge to SNF in the next 24 to 48 hours based on echocardiogram finding.  PT recommended CIR but not accepted.  Consultants:   PCCM  Procedures:   ETT 5/12-5/16  Microbiology: COVID (in house) 5/10 >> negative RVP 5/10 >> negative Blood 5/10 >> negative COVID (send out) 5/11 >> negative Sputum 5/12 >> normal flora  Antimicrobials: Anti-infectives (From admission, onward)   Start      Dose/Rate Route Frequency Ordered Stop   08/02/18 1000  levofloxacin (LEVAQUIN) 25 MG/ML solution 750 mg     750 mg Per Tube Daily 08/02/18 0918 08/03/18 0944   08/01/18 0900  doxycycline (VIBRAMYCIN) 100 mg in sodium chloride 0.9 % 250 mL IVPB  Status:  Discontinued     100 mg 125 mL/hr over 120 Minutes Intravenous Every 12 hours 08/01/18 0837 08/02/18 0846   07/31/18 2100  doxycycline (VIBRAMYCIN) 100 mg in sodium chloride 0.9 % 250 mL IVPB  Status:  Discontinued     100 mg 125 mL/hr over 120 Minutes Intravenous Every 12 hours 07/31/18 2002 08/01/18 0837   07/31/18 0200  vancomycin (VANCOCIN) IVPB 750 mg/150 ml premix  Status:  Discontinued     750 mg 150 mL/hr over 60 Minutes Intravenous Every 12 hours 07/30/18 0834 08/01/18 0837   07/30/18 1000  cefTRIAXone (ROCEPHIN) 2 g in sodium chloride 0.9 % 100 mL IVPB  Status:  Discontinued     2 g 200 mL/hr over 30 Minutes Intravenous Every 24 hours 07/30/18 0832 08/02/18 0846   07/30/18 0845  vancomycin (VANCOCIN) 1,250 mg in sodium chloride 0.9 % 250 mL IVPB     1,250 mg 166.7 mL/hr over 90 Minutes Intravenous  Once 07/30/18 0831 07/30/18 1809   07/29/18 2000  cefTRIAXone (ROCEPHIN) 1 g in sodium chloride 0.9 % 100 mL IVPB  Status:  Discontinued     1 g 200 mL/hr over 30 Minutes Intravenous Every 24 hours 07/29/18 0114 07/30/18  3704   07/29/18 2000  azithromycin (ZITHROMAX) 500 mg in sodium chloride 0.9 % 250 mL IVPB  Status:  Discontinued     500 mg 250 mL/hr over 60 Minutes Intravenous Every 24 hours 07/29/18 0114 07/31/18 2002   07/28/18 2215  cefTRIAXone (ROCEPHIN) 1 g in sodium chloride 0.9 % 100 mL IVPB     1 g 200 mL/hr over 30 Minutes Intravenous  Once 07/28/18 2203 07/28/18 2326   07/28/18 2215  azithromycin (ZITHROMAX) 500 mg in sodium chloride 0.9 % 250 mL IVPB     500 mg 250 mL/hr over 60 Minutes Intravenous  Once 07/28/18 2203 07/28/18 2354      Objective: Vitals:   08/05/18 1623 08/05/18 2329 08/06/18 0320 08/06/18 0500   BP: 140/84 136/86    Pulse: (!) 104 (!) 110 (!) 114   Resp: 18 20 18    Temp: 98.2 F (36.8 C) 98.2 F (36.8 C)    TempSrc: Oral     SpO2: 92% 94% 96%   Weight:    66.7 kg  Height:        Intake/Output Summary (Last 24 hours) at 08/06/2018 0656 Last data filed at 08/06/2018 0200 Gross per 24 hour  Intake 360 ml  Output 200 ml  Net 160 ml   Filed Weights   08/04/18 0600 08/05/18 0300 08/06/18 0500  Weight: 69.1 kg 67.7 kg 66.7 kg    Examination:  GENERAL: No acute distress.  Appears well.  HEENT: MMM.  Vision and hearing grossly intact.  NECK: Supple.  No apparent JVD. LUNGS:  No IWOB.  Fair air movement bilaterally.  Some rhonchi. HEART:  RRR. Heart sounds normal.  ABD: Bowel sounds present. Soft. Non tender.  MSK/EXT:  Moves all extremities. No apparent deformity. No edema bilaterally.  SKIN: no apparent skin lesion or wound NEURO: Awake, alert and oriented x4.  No gross deficit.  PSYCH: Calm. Normal affect.  Data Reviewed: I have independently reviewed following labs and imaging studies  CBC: Recent Labs  Lab 08/01/18 0324 08/02/18 0216 08/03/18 0255 08/04/18 0450 08/05/18 0606 08/06/18 0443  WBC 5.8 6.3 6.5 14.3* 8.9  --   NEUTROABS  --  5.0  --   --   --   --   HGB 9.2* 9.7* 10.0* 9.9* 9.4* 9.2*  HCT 28.1* 29.5* 30.3* 29.5* 28.4* 27.7*  MCV 113.8* 112.6* 111.4* 110.9* 111.8*  --   PLT 32* 42* 63* 90* 101*  --    Basic Metabolic Panel: Recent Labs  Lab 07/30/18 1759 07/31/18 0412 07/31/18 1634  08/02/18 0216 08/03/18 0255 08/04/18 0450 08/05/18 0606 08/06/18 0443  NA  --  141  --    < > 138 137 138 141 143  K  --  3.6  --    < > 4.2 4.1 3.8 3.5 3.9  CL  --  109  --    < > 109 107 107 106 104  CO2  --  23  --    < > 24 23 22 25 26   GLUCOSE  --  71  --    < > 133* 227* 123* 172* 137*  BUN  --  18  --    < > 11 15 17 20  25*  CREATININE  --  0.79  --    < > 0.69 0.68 0.77 0.93 0.91  CALCIUM  --  7.9*  --    < > 7.8* 8.1* 8.6* 8.3* 8.5*  MG 2.1  2.1 2.0  --  1.8  --  2.0  --  2.0  PHOS 3.5 2.4* 2.8  --  2.9  --  4.3  --   --    < > = values in this interval not displayed.   GFR: Estimated Creatinine Clearance: 83.7 mL/min (by C-G formula based on SCr of 0.91 mg/dL). Liver Function Tests: Recent Labs  Lab 07/31/18 0412  AST 71*  ALT 53*  ALKPHOS 69  BILITOT 0.8  PROT 6.1*  ALBUMIN 2.9*   No results for input(s): LIPASE, AMYLASE in the last 168 hours. No results for input(s): AMMONIA in the last 168 hours. Coagulation Profile: No results for input(s): INR, PROTIME in the last 168 hours. Cardiac Enzymes: No results for input(s): CKTOTAL, CKMB, CKMBINDEX, TROPONINI in the last 168 hours. BNP (last 3 results) No results for input(s): PROBNP in the last 8760 hours. HbA1C: Recent Labs    08/05/18 0606  HGBA1C 5.3   CBG: Recent Labs  Lab 08/04/18 1653 08/05/18 0734 08/05/18 1150 08/05/18 1620 08/05/18 2039  GLUCAP 151* 151* 183* 156* 139*   Lipid Profile: No results for input(s): CHOL, HDL, LDLCALC, TRIG, CHOLHDL, LDLDIRECT in the last 72 hours. Thyroid Function Tests: No results for input(s): TSH, T4TOTAL, FREET4, T3FREE, THYROIDAB in the last 72 hours. Anemia Panel: No results for input(s): VITAMINB12, FOLATE, FERRITIN, TIBC, IRON, RETICCTPCT in the last 72 hours. Urine analysis:    Component Value Date/Time   COLORURINE YELLOW 06/01/2018 1820   APPEARANCEUR CLEAR 06/01/2018 1820   LABSPEC 1.014 06/01/2018 1820   PHURINE 7.0 06/01/2018 1820   GLUCOSEU NEGATIVE 06/01/2018 1820   HGBUR NEGATIVE 06/01/2018 1820   [place] NEGATIVE 06/01/2018 [place] 06/01/2018 1820   PROTEINUR 30 (A) 06/01/2018 1820   NITRITE NEGATIVE 06/01/2018 1820   LEUKOCYTESUR NEGATIVE 06/01/2018 1820   Sepsis Labs: Invalid input(s): PROCALCITONIN, LACTICIDVEN  Recent Results (from the past 240 hour(s))  SARS Coronavirus 2 [facility] order, Performed in [facility] hospital lab)     Status: None   Collection  Time: 07/28/18  8:36 PM  Result Value Ref Range Status   SARS Coronavirus 2 NEGATIVE NEGATIVE Final    Comment: (NOTE) If result is NEGATIVE SARS-CoV-2 target nucleic acids are NOT DETECTED. The SARS-CoV-2 RNA is generally detectable in upper and lower  respiratory specimens during the acute phase of infection. The lowest  concentration of SARS-CoV-2 viral copies this assay can detect is 250  copies / mL. A negative result does not preclude SARS-CoV-2 infection  and should not be used as the sole basis for treatment or other  patient management decisions.  A negative result may occur with  improper specimen collection / handling, submission of specimen other  than nasopharyngeal swab, presence of viral mutation(s) within the  areas targeted by this assay, and inadequate number of viral copies  (<250 copies / mL). A negative result must be combined with clinical  observations, patient history, and epidemiological information. If result is POSITIVE SARS-CoV-2 target nucleic acids are DETECTED. The SARS-CoV-2 RNA is generally detectable in upper and lower  respiratory specimens dur ing the acute phase of infection.  Positive  results are indicative of active infection with SARS-CoV-2.  Clinical  correlation with patient history and other diagnostic information is  necessary to determine patient infection status.  Positive results do  not rule out bacterial infection or co-infection with other viruses. If result is PRESUMPTIVE POSTIVE SARS-CoV-2 nucleic acids MAY BE PRESENT.   A presumptive positive result was obtained  on the submitted specimen  and confirmed on repeat testing.  While 2019 novel coronavirus  (SARS-CoV-2) nucleic acids may be present in the submitted sample  additional confirmatory testing may be necessary for epidemiological  and / or clinical management purposes  to differentiate between  SARS-CoV-2 and other Sarbecovirus currently known to infect humans.  If  clinically indicated additional testing with an alternate test  methodology [phone]) is advised. The SARS-CoV-2 RNA is generally  detectable in upper and lower respiratory sp ecimens during the acute  phase of infection. The expected result is Negative. Fact Sheet for Patients:  StrictlyIdeas.no Fact Sheet for Healthcare Providers: BankingDealers.co.za This test is not yet approved or cleared by the [place] FDA and has been authorized for detection and/or diagnosis of SARS-CoV-2 by FDA under an Emergency Use Authorization (EUA).  This EUA will remain in effect (meaning this test can be used) for the duration of the COVID-19 declaration under Section 564(b)(1) of the Act, 21 U.S.C. section 360bbb-3(b)(1), unless the authorization is terminated or revoked sooner. Performed at [facility], [place] [address] [place], [place] [address]   Novel Coronavirus,NAA,(SEND-OUT TO REF LAB - TAT 24-48 hrs); Hosp Order     Status: None   Collection Time: 07/28/18  8:36 PM  Result Value Ref Range Status   SARS-CoV-2, NAA NOT DETECTED NOT DETECTED Final    Comment: (NOTE) This test was developed and its performance characteristics determined by [employer]. This test has not been FDA cleared or approved. This test has been authorized by FDA under an Emergency Use Authorization (EUA). This test is only authorized for the duration of time the declaration that circumstances exist justifying the authorization of the emergency use of in vitro diagnostic tests for detection of SARS-CoV-2 virus and/or diagnosis of COVID-19 infection under section 564(b)(1) of the Act, 21 U.S.C. 841LKG-4(W)(1), unless the authorization is terminated or revoked sooner. When diagnostic testing is negative, the possibility of a false negative result should be considered in the context of a patient's recent exposures and the presence of clinical signs and  symptoms consistent with COVID-19. An individual without symptoms of COVID-19 and who is not shedding SARS-CoV-2 virus would expect to have a negative (not detected) result in this assay. Performed  At: [facility] [address] [place], [place] [id number] Rush Farmer MD QI:[id number]    [place]  Final    Comment: Performed at [facility], [place] [address], [place], [place] [address]  Respiratory Panel by PCR     Status: None   Collection Time: 07/28/18  8:36 PM  Result Value Ref Range Status   Adenovirus NOT DETECTED NOT DETECTED Final   Coronavirus 229E NOT DETECTED NOT DETECTED Final    Comment: (NOTE) The Coronavirus on the Respiratory Panel, DOES NOT test for the novel  Coronavirus (2019 nCoV)    Coronavirus HKU1 NOT DETECTED NOT DETECTED Final   Coronavirus NL63 NOT DETECTED NOT DETECTED Final   Coronavirus OC43 NOT DETECTED NOT DETECTED Final   Metapneumovirus NOT DETECTED NOT DETECTED Final   Rhinovirus / Enterovirus NOT DETECTED NOT DETECTED Final   Influenza A NOT DETECTED NOT DETECTED Final   Influenza B NOT DETECTED NOT DETECTED Final   Parainfluenza Virus 1 NOT DETECTED NOT DETECTED Final   Parainfluenza Virus 2 NOT DETECTED NOT DETECTED Final   Parainfluenza Virus 3 NOT DETECTED NOT DETECTED Final   Parainfluenza Virus 4 NOT DETECTED NOT DETECTED Final   Respiratory Syncytial Virus  NOT DETECTED NOT DETECTED Final   Bordetella pertussis NOT DETECTED NOT DETECTED Final   Chlamydophila pneumoniae NOT DETECTED NOT DETECTED Final   Mycoplasma pneumoniae NOT DETECTED NOT DETECTED Final    Comment: Performed at [facility], [place] [address], [place], [place] [address]  Blood culture (routine x 2)     Status: None   Collection Time: 07/28/18 10:25 PM  Result Value Ref Range Status   Specimen Description BLOOD RIGHT UPPER ARM  Final   Special Requests   Final    BOTTLES DRAWN AEROBIC AND ANAEROBIC Blood Culture adequate  volume   Culture   Final    NO GROWTH 5 DAYS Performed at [facility], [address] [place], [place] [address]    Report Status 08/02/2018 FINAL  Final  Blood culture (routine x 2)     Status: None   Collection Time: 07/28/18 10:36 PM  Result Value Ref Range Status   Specimen Description BLOOD RIGHT ARM  Final   Special Requests   Final    BOTTLES DRAWN AEROBIC AND ANAEROBIC Blood Culture adequate volume   Culture   Final    NO GROWTH 5 DAYS Performed at [facility], [place] [address], [place], [place] [address]    Report Status 08/02/2018 FINAL  Final  Culture, sputum-assessment     Status: None   Collection Time: 07/29/18  3:14 PM  Result Value Ref Range Status   Specimen Description EXPECTORATED SPUTUM  Final   Special Requests NONE  Final   Sputum evaluation   Final    Sputum specimen not acceptable for testing.  Please recollect.   RESULT CALLED TO, READ BACK BY AND VERIFIED WITH: Brennan Bailey RN [id number] 07/29/18 A BROWNING Performed at [facility], [place] [address], [place], [place] [address]    Report Status 07/29/2018 FINAL  Final  MRSA PCR Screening     Status: None   Collection Time: 07/30/18 10:00 AM  Result Value Ref Range Status   MRSA by PCR NEGATIVE NEGATIVE Final    Comment:        The GeneXpert MRSA Assay (FDA approved for NASAL specimens only), is one component of a comprehensive MRSA colonization surveillance program. It is not intended to diagnose MRSA infection nor to guide or monitor treatment for MRSA infections. Performed at [facility], [place] [address], [place], [place] [address]   Culture, respiratory (non-expectorated)     Status: None   Collection Time: 07/30/18 10:21 AM  Result Value Ref Range Status   Specimen Description TRACHEAL ASPIRATE  Final   Special Requests NONE  Final   Gram Stain NO WBC SEEN NO ORGANISMS SEEN   Final   Culture   Final    RARE Consistent with normal respiratory flora. Performed at [facility], [place] [address], [place], [place] [address]    Report Status 08/01/2018 FINAL  Final      Radiology Studies: No results found.  Taye T. [facility] Pager [phone]  If 7PM-7AM, please contact night-coverage [email or web address] Password [email or web address] 08/06/2018, 6:56 AM

## 2018-08-06 NOTE — TOC Progression Note (Signed)
Transition of Care [facility]) - Progression Note    Patient Details  Name: Trevor Williams MRN: [id number] Date of Birth: 03-17-1961  [facility]) CM/SW Contact  Eileen Stanford, LCSW Phone Number: 08/06/2018, 12:03 PM  Clinical Narrative:   Pt has no bed offers at this time. CSW faxed pt out further. Pt needs to continue to work with PT.    Expected Discharge Plan: [place] Barriers to Discharge: Continued Medical Work up, SNF Pending payor source - LOG  Expected Discharge Plan and Services Expected Discharge Plan: [place] In-house Referral: Clinical Social Work Discharge Planning Services: NA Post Acute Care Choice: [place] Living arrangements for the past 2 months: Homeless                 DME Arranged: N/A DME Agency: NA       HH Arranged: NA HH Agency: NA         Social Determinants of Health (SDOH) Interventions    Readmission Risk Interventions No flowsheet data found.

## 2018-08-06 NOTE — Progress Notes (Signed)
Telesitter continues; pt impulsive, intermittently redirectable, does not comply with fall education. Pt has intermittent tremors, intermittently disoriented w/ difficulty reorienting, intermittently sweaty. x1 urinary incontinence. x1 duoneb overnight.

## 2018-08-07 ENCOUNTER — Other Ambulatory Visit (HOSPITAL_COMMUNITY): Payer: Self-pay

## 2018-08-07 LAB — BASIC METABOLIC PANEL
Anion gap: 10 (ref 5–15)
BUN: 20 mg/dL (ref 6–20)
CO2: 29 mmol/L (ref 22–32)
Calcium: 8.3 mg/dL — ABNORMAL LOW (ref 8.9–10.3)
Chloride: 100 mmol/L (ref 98–111)
Creatinine, Ser: 0.83 mg/dL (ref 0.61–1.24)
GFR calc Af Amer: >60 mL/min (ref >60)
GFR calc non Af Amer: >60 mL/min (ref >60)
Glucose, Bld: 94 mg/dL (ref 70–99)
Potassium: 3.1 mmol/L — ABNORMAL LOW (ref 3.5–5.1)
Sodium: 139 mmol/L (ref 135–145)

## 2018-08-07 LAB — ECHOCARDIOGRAM COMPLETE
Height: 67 in
Weight: 2437.41 oz

## 2018-08-07 LAB — HEMOGLOBIN AND HEMATOCRIT, BLOOD
HCT: 31.4 % — ABNORMAL LOW (ref 39.0–52.0)
Hemoglobin: 10.3 g/dL — ABNORMAL LOW (ref 13.0–17.0)

## 2018-08-07 LAB — TROPONIN I
Troponin I: 0.03 ng/mL (ref <0.03)
Troponin I: 0.03 ng/mL (ref <0.03)

## 2018-08-07 LAB — GLUCOSE, CAPILLARY
Glucose-Capillary: 82 mg/dL (ref 70–99)
Glucose-Capillary: 98 mg/dL (ref 70–99)
Glucose-Capillary: 104 mg/dL — ABNORMAL HIGH (ref 70–99)
Glucose-Capillary: 84 mg/dL (ref 70–99)

## 2018-08-07 LAB — MAGNESIUM: Magnesium: 1.7 mg/dL (ref 1.7–2.4)

## 2018-08-07 MED ORDER — FUROSEMIDE 20 MG PO TABS
20.0000 mg | ORAL_TABLET | Freq: Every day | ORAL | Status: DC
  Administered 2018-08-08 – 2018-08-09 (×2): 20 mg via ORAL
  Filled 2018-08-07 (×2): qty 1

## 2018-08-07 MED ORDER — MAGNESIUM SULFATE 2 GM/50ML IV SOLN
2.0000 g | Freq: Once | INTRAVENOUS | Status: AC
  Administered 2018-08-07: 2 g via INTRAVENOUS
  Filled 2018-08-07 (×2): qty 50

## 2018-08-07 MED ORDER — POTASSIUM CHLORIDE CRYS ER 20 MEQ PO TBCR
40.0000 meq | EXTENDED_RELEASE_TABLET | Freq: Two times a day (BID) | ORAL | Status: DC
  Administered 2018-08-07 – 2018-08-08 (×4): 40 meq via ORAL
  Filled 2018-08-07 (×4): qty 2

## 2018-08-07 NOTE — Progress Notes (Signed)
PROGRESS NOTE    Trevor Williams  OIZ:[id number] DOB: 02-Apr-1960 DOA: 07/28/2018 PCP: Marliss Coots, NP    Brief Narrative; 58 year old with past medical history significant for tobacco use disorder, alcohol use disorder and a skin cancer presenting with progressive shortness of breath and dyspnea on exertion for 1 month.  Has had intermittent chest pain.  EMS: Patient was placed on a nonrebreather, given epinephrine, Solu-Medrol, magnesium and albuterol and brought to the ED.  In the ED patient was wheezing and hypoxic.  ABG: pH seven-point 3/46/47/23.  BNP elevated 412.  CRP less than 0.8.  CBC with microcytic anemia at baseline, thrombocytopenia.  Troponin mildly elevated.  EKG with sinus rhythm with possible lateral wall ischemia.  D-dimer was elevated.  CTA showed multifocal pneumonia and changes consistent with emphysema and possible coronary artery disease.  COVID-19 test was negative.  Patient was admitted with acute respiratory failure with hypoxemia secondary to pneumonia and possible COPD exacerbation. During hospitalization patient became more hypoxic, developed alcohol withdrawal and was transferred to ICU.  He was intubated from 5/12 and extubated on 5/16.  Patient was a started on Precedex for alcohol withdrawal.  Patient was treated with with broad-spectrum antibiotics for pneumonia. TRH resume care on 5/17.     Assessment & Plan:   Principal Problem:   Acute respiratory failure with hypoxia (HCC) Active Problems:   Community acquired pneumonia  1-acute hypoxic respiratory failure: Multifactorial secondary to pneumonia, COPD exacerbation and possible CHF exacerbation. Initial ABG showed compensated respiratory acidosis and hypoxemia. He is still requiring 1.5 L of oxygen. Continue with nebulizer. He has received IV Lasix 20 mg IV twice daily. His weight is down to 61 kg from 66.  Will transition to oral Lasix.  2-Community-acquired pneumonia: Noted on CT a chest.   Patient completed course of antibiotics.  COVID-19 negative x2.  RVP negative.  Blood and sputum cultures negative.  3-COPD exacerbation, no formal diagnosis but he has a 40-pack-year history.  Current smoker.. -Continue with LAMA/LABA-Anoro Ellipta -finished solumedrol.  -PRN nebulizer.   4-possible CHF exacerbation: Presented with worsening dyspnea on exertion for months.  BNP elevated.  Started on IV Lasix he had good response to diuretics. -Awaiting echo results.  Discussed with charge nurse this morning, echo was never done.  She spoke with echo department limited, and to echocardiogram today.  5-EKG changes, new T wave changes compared to prior EKG.  He denies any recent chest pain.  Reports chest pain weeks ago.  We will cycle enzymes.  Replete potassium and magnesium repeat EKG in the morning  6-alcohol use disorder/alcohol withdrawal: He reports drinking about half a paint of liquor a day. ETT from 5/12 to 5/16. Continue with multivitamins, thiamine and folic acid. He was treated with Precedex in the ICU.  Thrombocytopenia: Likely related to alcohol use monitor.  Severe protein caloric malnutrition.  Likely due to acute on chronic illness, alcohol use disorder.  Appreciate nutrition's input  Delirium: Impulsive and disoriented intermittently overnight. His Klonopin was reduced to 0.5 twice daily. Appears more calm today.  hypoKalemia/hypomagnesemia: Replete with IV magnesium.  Replete with 40 mEq x 2 KCl   Nutrition Problem: Increased nutrient needs Etiology: chronic illness(COPD, emphysema)    Signs/Symptoms: estimated needs    Interventions: MVI, Ensure Enlive (each supplement provides 350kcal and 20 grams of protein)  Estimated body mass index is 17.92 kg/m as calculated from the following:   Height as of this encounter: 5\' 7"  (1.702 m).   Weight as of  this encounter: 51.9 kg.   DVT prophylaxis: SCDs Code Status; full code Family Communication: Care  discussed with patient Disposition Plan: Awaiting echo to be done, new EKG changes today on 5/20.  We will cycle enzymes and repeat EKG in the morning replete electrolytes  Consultants:  CCM   Procedures:   ETT 5/12-5/16   Antimicrobials:  COVID (in house) 5/10 >> negative RVP 5/10 >> negative Blood 5/10 >> negative COVID (send out) 5/11 >> negative Sputum 5/12 >> normal flora   Subjective: Patient is alert, appears calm.  He denies chest pain today.  He reports that he is breathing better.  He does relate prior episode of chest pain weeks ago  Objective: Vitals:   08/06/18 1652 08/06/18 2331 08/07/18 0459 08/07/18 0805  BP: (!) 141/73 123/90  118/82  Pulse: (!) 108 (!) 115  (!) 102  Resp: 18   18  Temp: 98.7 F (37.1 C) 98.3 F (36.8 C)  99.5 F (37.5 C)  TempSrc:  Oral  Oral  SpO2: 91% 90%  95%  Weight:   51.9 kg   Height:        Intake/Output Summary (Last 24 hours) at 08/07/2018 1421 Last data filed at 08/07/2018 1046 Gross per 24 hour  Intake -  Output 1975 ml  Net -1975 ml   Filed Weights   08/05/18 0300 08/06/18 0500 08/07/18 0459  Weight: 67.7 kg 66.7 kg 51.9 kg    Examination:  General exam: Appears calm and comfortable  Respiratory system: Crackles at the bases, no wheezing. Cardiovascular system: S1 & S2 heard, RRR. No JVD, murmurs, rubs, gallops or clicks. No pedal edema. Gastrointestinal system: Abdomen is nondistended, soft and nontender. No organomegaly or masses felt. Normal bowel sounds heard. Central nervous system: Alert and oriented. No focal neurological deficits. Extremities: Symmetric 5 x 5 power. Skin: No rashes, lesions or ulcers    Data Reviewed: I have personally reviewed following labs and imaging studies  CBC: Recent Labs  Lab 08/01/18 0324 08/02/18 0216 08/03/18 0255 08/04/18 0450 08/05/18 0606 08/06/18 0443 08/07/18 0527  WBC 5.8 6.3 6.5 14.3* 8.9  --   --   NEUTROABS  --  5.0  --   --   --   --   --   HGB 9.2*  9.7* 10.0* 9.9* 9.4* 9.2* 10.3*  HCT 28.1* 29.5* 30.3* 29.5* 28.4* 27.7* 31.4*  MCV 113.8* 112.6* 111.4* 110.9* 111.8*  --   --   PLT 32* 42* 63* 90* 101*  --   --    Basic Metabolic Panel: Recent Labs  Lab 07/31/18 1634  08/02/18 0216 08/03/18 0255 08/04/18 0450 08/05/18 0606 08/06/18 0443 08/07/18 0527  NA  --    < > 138 137 138 141 143 139  K  --    < > 4.2 4.1 3.8 3.5 3.9 3.1*  CL  --    < > 109 107 107 106 104 100  CO2  --    < > 24 23 22 25 26 29   GLUCOSE  --    < > 133* 227* 123* 172* 137* 94  BUN  --    < > 11 15 17 20  25* 20  CREATININE  --    < > 0.69 0.68 0.77 0.93 0.91 0.83  CALCIUM  --    < > 7.8* 8.1* 8.6* 8.3* 8.5* 8.3*  MG 2.0  --  1.8  --  2.0  --  2.0 1.7  PHOS 2.8  --  2.9  --  4.3  --   --   --    < > = values in this interval not displayed.   GFR: Estimated Creatinine Clearance: 72.1 mL/min (by C-G formula based on SCr of 0.83 mg/dL). Liver Function Tests: No results for input(s): AST, ALT, ALKPHOS, BILITOT, PROT, ALBUMIN in the last 168 hours. No results for input(s): LIPASE, AMYLASE in the last 168 hours. No results for input(s): AMMONIA in the last 168 hours. Coagulation Profile: No results for input(s): INR, PROTIME in the last 168 hours. Cardiac Enzymes: No results for input(s): CKTOTAL, CKMB, CKMBINDEX, TROPONINI in the last 168 hours. BNP (last 3 results) No results for input(s): PROBNP in the last 8760 hours. HbA1C: Recent Labs    08/05/18 0606  HGBA1C 5.3   CBG: Recent Labs  Lab 08/06/18 0747 08/06/18 1215 08/06/18 1646 08/06/18 2113 08/07/18 0827  GLUCAP 133* 127* 145* 93 82   Lipid Profile: No results for input(s): CHOL, HDL, LDLCALC, TRIG, CHOLHDL, LDLDIRECT in the last 72 hours. Thyroid Function Tests: No results for input(s): TSH, T4TOTAL, FREET4, T3FREE, THYROIDAB in the last 72 hours. Anemia Panel: No results for input(s): VITAMINB12, FOLATE, FERRITIN, TIBC, IRON, RETICCTPCT in the last 72 hours. Sepsis Labs: No  results for input(s): PROCALCITON, LATICACIDVEN in the last 168 hours.  Recent Results (from the past 240 hour(s))  SARS Coronavirus 2 Operating Room Services order, Performed in [place] hospital lab)     Status: None   Collection Time: 07/28/18  8:36 PM  Result Value Ref Range Status   SARS Coronavirus 2 NEGATIVE NEGATIVE Final    Comment: (NOTE) If result is NEGATIVE SARS-CoV-2 target nucleic acids are NOT DETECTED. The SARS-CoV-2 RNA is generally detectable in upper and lower  respiratory specimens during the acute phase of infection. The lowest  concentration of SARS-CoV-2 viral copies this assay can detect is 250  copies / mL. A negative result does not preclude SARS-CoV-2 infection  and should not be used as the sole basis for treatment or other  patient management decisions.  A negative result may occur with  improper specimen collection / handling, submission of specimen other  than nasopharyngeal swab, presence of viral mutation(s) within the  areas targeted by this assay, and inadequate number of viral copies  (<250 copies / mL). A negative result must be combined with clinical  observations, patient history, and epidemiological information. If result is POSITIVE SARS-CoV-2 target nucleic acids are DETECTED. The SARS-CoV-2 RNA is generally detectable in upper and lower  respiratory specimens dur ing the acute phase of infection.  Positive  results are indicative of active infection with SARS-CoV-2.  Clinical  correlation with patient history and other diagnostic information is  necessary to determine patient infection status.  Positive results do  not rule out bacterial infection or co-infection with other viruses. If result is PRESUMPTIVE POSTIVE SARS-CoV-2 nucleic acids MAY BE PRESENT.   A presumptive positive result was obtained on the submitted specimen  and confirmed on repeat testing.  While 2019 novel coronavirus  (SARS-CoV-2) nucleic acids may be present in the submitted  sample  additional confirmatory testing may be necessary for epidemiological  and / or clinical management purposes  to differentiate between  SARS-CoV-2 and other Sarbecovirus currently known to infect humans.  If clinically indicated additional testing with an alternate test  methodology [phone]) is advised. The SARS-CoV-2 RNA is generally  detectable in upper and lower respiratory sp ecimens during the acute  phase of infection. The expected  result is Negative. Fact Sheet for Patients:  StrictlyIdeas.no Fact Sheet for Healthcare Providers: BankingDealers.co.za This test is not yet approved or cleared by the [place] FDA and has been authorized for detection and/or diagnosis of SARS-CoV-2 by FDA under an Emergency Use Authorization (EUA).  This EUA will remain in effect (meaning this test can be used) for the duration of the COVID-19 declaration under Section 564(b)(1) of the Act, 21 U.S.C. section 360bbb-3(b)(1), unless the authorization is terminated or revoked sooner. Performed at [facility], [place] [address] [place], [place] [address]   Novel Coronavirus,NAA,(SEND-OUT TO REF LAB - TAT 24-48 hrs); Hosp Order     Status: None   Collection Time: 07/28/18  8:36 PM  Result Value Ref Range Status   SARS-CoV-2, NAA NOT DETECTED NOT DETECTED Final    Comment: (NOTE) This test was developed and its performance characteristics determined by [employer]. This test has not been FDA cleared or approved. This test has been authorized by FDA under an Emergency Use Authorization (EUA). This test is only authorized for the duration of time the declaration that circumstances exist justifying the authorization of the emergency use of in vitro diagnostic tests for detection of SARS-CoV-2 virus and/or diagnosis of COVID-19 infection under section 564(b)(1) of the Act, 21 U.S.C. 401UUV-2(Z)(3), unless the authorization is  terminated or revoked sooner. When diagnostic testing is negative, the possibility of a false negative result should be considered in the context of a patient's recent exposures and the presence of clinical signs and symptoms consistent with COVID-19. An individual without symptoms of COVID-19 and who is not shedding SARS-CoV-2 virus would expect to have a negative (not detected) result in this assay. Performed  At: [facility] [address] [place], [place] [id number] Rush Farmer MD QV:[id number]    [place]  Final    Comment: Performed at [facility], [place] [address] [place], [place] [address]  Respiratory Panel by PCR     Status: None   Collection Time: 07/28/18  8:36 PM  Result Value Ref Range Status   Adenovirus NOT DETECTED NOT DETECTED Final   Coronavirus 229E NOT DETECTED NOT DETECTED Final    Comment: (NOTE) The Coronavirus on the Respiratory Panel, DOES NOT test for the novel  Coronavirus (2019 nCoV)    Coronavirus HKU1 NOT DETECTED NOT DETECTED Final   Coronavirus NL63 NOT DETECTED NOT DETECTED Final   Coronavirus OC43 NOT DETECTED NOT DETECTED Final   Metapneumovirus NOT DETECTED NOT DETECTED Final   Rhinovirus / Enterovirus NOT DETECTED NOT DETECTED Final   Influenza A NOT DETECTED NOT DETECTED Final   Influenza B NOT DETECTED NOT DETECTED Final   Parainfluenza Virus 1 NOT DETECTED NOT DETECTED Final   Parainfluenza Virus 2 NOT DETECTED NOT DETECTED Final   Parainfluenza Virus 3 NOT DETECTED NOT DETECTED Final   Parainfluenza Virus 4 NOT DETECTED NOT DETECTED Final   Respiratory Syncytial Virus NOT DETECTED NOT DETECTED Final   Bordetella pertussis NOT DETECTED NOT DETECTED Final   Chlamydophila pneumoniae NOT DETECTED NOT DETECTED Final   Mycoplasma pneumoniae NOT DETECTED NOT DETECTED Final    Comment: Performed at [facility], [place]. [address] [place], [place] [address]  Blood culture (routine x 2)     Status:  None   Collection Time: 07/28/18 10:25 PM  Result Value Ref Range Status   Specimen Description BLOOD RIGHT UPPER ARM  Final   Special Requests   Final  BOTTLES DRAWN AEROBIC AND ANAEROBIC Blood Culture adequate volume   Culture   Final    NO GROWTH 5 DAYS Performed at [facility], [place] [address] [place], [place] [address]    Report Status 08/02/2018 FINAL  Final  Blood culture (routine x 2)     Status: None   Collection Time: 07/28/18 10:36 PM  Result Value Ref Range Status   Specimen Description BLOOD RIGHT ARM  Final   Special Requests   Final    BOTTLES DRAWN AEROBIC AND ANAEROBIC Blood Culture adequate volume   Culture   Final    NO GROWTH 5 DAYS Performed at [facility], [place] [address] [place], [place] [address]    Report Status 08/02/2018 FINAL  Final  Culture, sputum-assessment     Status: None   Collection Time: 07/29/18  3:14 PM  Result Value Ref Range Status   Specimen Description EXPECTORATED SPUTUM  Final   Special Requests NONE  Final   Sputum evaluation   Final    Sputum specimen not acceptable for testing.  Please recollect.   RESULT CALLED TO, READ BACK BY AND VERIFIED WITH: Brennan Bailey RN [id number] 07/29/18 A BROWNING Performed at [facility], [place] [address] [place], [place] [address]    Report Status 07/29/2018 FINAL  Final  MRSA PCR Screening     Status: None   Collection Time: 07/30/18 10:00 AM  Result Value Ref Range Status   MRSA by PCR NEGATIVE NEGATIVE Final    Comment:        The GeneXpert MRSA Assay (FDA approved for NASAL specimens only), is one component of a comprehensive MRSA colonization surveillance program. It is not intended to diagnose MRSA infection nor to guide or monitor treatment for MRSA infections. Performed at [facility], [place] [address] [place], [place] [address]   Culture, respiratory (non-expectorated)     Status: None   Collection Time: 07/30/18 10:21 AM  Result Value Ref Range Status   Specimen  Description TRACHEAL ASPIRATE  Final   Special Requests NONE  Final   Gram Stain NO WBC SEEN NO ORGANISMS SEEN   Final   Culture   Final    RARE Consistent with normal respiratory flora. Performed at [facility], [place] [address], [place], [place] [address]    Report Status 08/01/2018 FINAL  Final  Novel Coronavirus, NAA (hospital order; send-out to ref lab)     Status: None   Collection Time: 08/05/18  5:35 PM  Result Value Ref Range Status   SARS-CoV-2, NAA NOT DETECTED NOT DETECTED Final    Comment: (NOTE) This test was developed and its performance characteristics determined by [employer]. This test has not been FDA cleared or approved. This test has been authorized by FDA under an Emergency Use Authorization (EUA). This test is only authorized for the duration of time the declaration that circumstances exist justifying the authorization of the emergency use of in vitro diagnostic tests for detection of SARS-CoV-2 virus and/or diagnosis of COVID-19 infection under section 564(b)(1) of the Act, 21 U.S.C. 774JOI-7(O)(6), unless the authorization is terminated or revoked sooner. When diagnostic testing is negative, the possibility of a false negative result should be considered in the context of a patient's recent exposures and the presence of clinical signs and symptoms consistent with COVID-19. An individual without symptoms of COVID-19 and who is not shedding SARS-CoV-2 virus would expect to have a negative (not detected) result in  this assay. Performed  At: [facility] [address] [place], [place] [id number] Rush Farmer MD UL:[id number]    [place]  Final    Comment: Performed at [facility], [place] [address], [place], [place] [address]         Radiology Studies: No results found.      Scheduled Meds: . Chlorhexidine Gluconate Cloth  6 each Topical Q0600  . famotidine  20 mg Oral BID  . feeding  supplement (ENSURE ENLIVE)  237 mL Oral TID BM  . folic acid  1 mg Oral Daily  . [START ON 08/08/2018] furosemide  20 mg Oral Daily  . guaiFENesin  5 mL Oral BID  . mouth rinse  15 mL Mouth Rinse BID  . multivitamin with minerals  1 tablet Oral Daily  . nicotine  21 mg Transdermal Daily  . potassium chloride  40 mEq Oral BID  . thiamine  100 mg Oral Daily  . traZODone  50 mg Oral QHS  . umeclidinium-vilanterol  1 puff Inhalation Daily   Continuous Infusions: . sodium chloride 10 mL/hr at 08/01/18 2200  . magnesium sulfate bolus IVPB       LOS: 9 days    Time spent: 35 minutes.     Elmarie Shiley, MD [facility] Pager [phone]  If 7PM-7AM, please contact night-coverage [email or web address] Password [email or web address] 08/07/2018, 2:21 PM

## 2018-08-07 NOTE — Progress Notes (Signed)
Physical Therapy Treatment Patient Details Name: Trevor Williams MRN: [id number] DOB: 04-28-1960 Today's Date: 08/07/2018    History of Present Illness Patient is a 58 y.o. male with PMH: ETOH abuse, active tobacco user, COPD, empysema, skin cancer, who was admitted for dyspnea, wheezing and hypoxia. He developed alcohol withdrawl, respiratory failure and right lower lung infiltrate and was intubated on 5/12 and extubated on 5/16 in AM.     PT Comments    Continuing work on functional mobility and activity tolerance;  Able to walk further today, and RW helps with gait stability; Overall progressing well, though still tachycardic with activity; Will continue to watch HR and O2 sats with activity; Anticipate continuing good progress at post-acute rehabilitation.    Follow Up Recommendations  SNF     Equipment Recommendations  Rolling walker with 5" wheels;3in1 (PT)    Recommendations for Other Services       Precautions / Restrictions Precautions Precautions: Fall Precaution Comments: High fall risk, x1 fall since admission    Mobility  Bed Mobility Overal bed mobility: Needs Assistance Bed Mobility: Supine to Sit     Supine to sit: Min guard     General bed mobility comments: minguard for safety, increased time and effort  Transfers Overall transfer level: Needs assistance Equipment used: Rolling walker (2 wheeled) Transfers: Sit to/from Stand Sit to Stand: Min assist         General transfer comment: Min assist to steady; tends to pull up on RW  Ambulation/Gait Ambulation/Gait assistance: Min assist;+2 safety/equipment Gait Distance (Feet): 40 Feet Assistive device: Rolling walker (2 wheeled) Gait Pattern/deviations: Step-to pattern;Step-through pattern;Decreased stride length;Trunk flexed;Drifts right/left Gait velocity: slow   General Gait Details: Dependent on RW for bil UE support for stability; still unsteady, and with decr activity tolerance, but better  than previous sessions; tachy HR with activity   Stairs             Wheelchair Mobility    Modified Rankin (Stroke Patients Only)       Balance     Sitting balance-Leahy Scale: Fair       Standing balance-Leahy Scale: Poor Standing balance comment: reliant on UE/external assist for standing balance                            Cognition Arousal/Alertness: Awake/alert Behavior During Therapy: WFL for tasks assessed/performed Overall Cognitive Status: No family/caregiver present to determine baseline cognitive functioning                                        Exercises      General Comments General comments (skin integrity, edema, etc.): O2 sats decr to 87% with amb on Room Air and HR max observed was 138      Pertinent Vitals/Pain Pain Assessment: Faces Pain Score: 0-No pain Faces Pain Scale: Hurts a little bit Pain Location: Head Pain Descriptors / Indicators: Headache Pain Intervention(s): Monitored during session    Home Living                      Prior Function            PT Goals (current goals can now be found in the care plan section) Acute Rehab PT Goals Patient Stated Goal: agreeable to further rehab/therapies after discharge PT Goal Formulation: With patient Time For  Goal Achievement: 08/18/18 Potential to Achieve Goals: Good Progress towards PT goals: Progressing toward goals    Frequency    Min 3X/week      PT Plan Discharge plan needs to be updated    Co-evaluation              AM-PAC PT "6 Clicks" Mobility   Outcome Measure  Help needed turning from your back to your side while in a flat bed without using bedrails?: A Little Help needed moving from lying on your back to sitting on the side of a flat bed without using bedrails?: A Little Help needed moving to and from a bed to a chair (including a wheelchair)?: A Little Help needed standing up from a chair using your arms (e.g.,  wheelchair or bedside chair)?: A Lot Help needed to walk in hospital room?: A Lot Help needed climbing 3-5 steps with a railing? : A Lot 6 Click Score: 15    End of Session Equipment Utilized During Treatment: Gait belt Activity Tolerance: Patient tolerated treatment well Patient left: in chair;with call bell/phone within reach;with chair alarm set Nurse Communication: Mobility status PT Visit Diagnosis: Unsteadiness on feet (R26.81);Muscle weakness (generalized) (M62.81);History of falling (Z91.81);Ataxic gait (R26.0);Difficulty in walking, not elsewhere classified (R26.2)     Time: 6286-3817 PT Time Calculation (min) (ACUTE ONLY): 22 min  Charges:  $Gait Training: 8-22 mins                     Roney Marion, [place]  Acute Rehabilitation Services Pager [phone] Office [place] 08/07/2018, 3:49 PM

## 2018-08-07 NOTE — Progress Notes (Signed)
0.03-troponin-Critical called by lab. MD paged to make aware.

## 2018-08-07 NOTE — Progress Notes (Signed)
  Speech Language Pathology Treatment: Dysphagia  Patient Details Name: Trevor Williams MRN: [id number] DOB: 1961/03/13 Today's Date: 08/07/2018 Time: 9324-1991 SLP Time Calculation (min) (ACUTE ONLY): 15 min  Assessment / Plan / Recommendation Clinical Impression  Patient seen to address dysphagia goals and determine readiness for upgrade of diet consistency from dysphagia 3 to regular solids. Patient was sitting up in bed, voice clear, not currently using nasal cannula (using primarily now during physical activities and sleep). Patient no longer exhibits hoarse vocal quality, no s/s of pharyngeal secretions and no coughing or throat clearing with PO's of regular solids and thin liquids via straw sips. Patient is edentulous but reports that although he does not wear dentures, he can chew up any foods (steak, pork chops, etc). Patient able to masticate hard solid (graham crackers) without difficulty and without overt s/s of aspiration or penetration. Patient appears to be back to baseline with his swallow function. SLP to upgrade to regular solids, thin liquids and discharge at this time.    HPI HPI: Patient is a 58 y.o. male with PMH: ETOH abuse, active tobacco user, COPD, empysema, skin cancer, who was admitted for dyspnea, wheezing and hypoxia. He developed alcohol withdrawl, respiratory failure and right lower lung infiltrate and was intubated on 5/12 and extubated on 5/16 in AM.       SLP Plan  Discharge SLP treatment due to (comment)(goals met)       Recommendations  Diet recommendations: Regular;Thin liquid Liquids provided via: Cup;Straw Medication Administration: Whole meds with liquid Supervision: Patient able to self feed Postural Changes and/or Swallow Maneuvers: Seated upright 90 degrees                Oral Care Recommendations: Patient independent with oral care SLP Visit Diagnosis: Dysphagia, unspecified (R13.10) Plan: Discharge SLP treatment due to (comment)(goals  met)       GO                Dannial Monarch 08/07/2018, 1:48 PM   Sonia Baller, MA, CCC-SLP Speech Therapy [facility] Acute Rehab Pager: [phone]

## 2018-08-08 DIAGNOSIS — I5023 Acute on chronic systolic (congestive) heart failure: Secondary | ICD-10-CM

## 2018-08-08 LAB — BASIC METABOLIC PANEL
Anion gap: 9 (ref 5–15)
BUN: 16 mg/dL (ref 6–20)
CO2: 25 mmol/L (ref 22–32)
Calcium: 8.2 mg/dL — ABNORMAL LOW (ref 8.9–10.3)
Chloride: 104 mmol/L (ref 98–111)
Creatinine, Ser: 0.74 mg/dL (ref 0.61–1.24)
GFR calc Af Amer: >60 mL/min (ref >60)
GFR calc non Af Amer: >60 mL/min (ref >60)
Glucose, Bld: 112 mg/dL — ABNORMAL HIGH (ref 70–99)
Potassium: 3.9 mmol/L (ref 3.5–5.1)
Sodium: 138 mmol/L (ref 135–145)

## 2018-08-08 LAB — GLUCOSE, CAPILLARY
Glucose-Capillary: 92 mg/dL (ref 70–99)
Glucose-Capillary: 106 mg/dL — ABNORMAL HIGH (ref 70–99)
Glucose-Capillary: 121 mg/dL — ABNORMAL HIGH (ref 70–99)
Glucose-Capillary: 102 mg/dL — ABNORMAL HIGH (ref 70–99)

## 2018-08-08 LAB — MAGNESIUM: Magnesium: 1.9 mg/dL (ref 1.7–2.4)

## 2018-08-08 LAB — TROPONIN I: Troponin I: 0.03 ng/mL (ref <0.03)

## 2018-08-08 MED ORDER — MAGNESIUM OXIDE 400 (241.3 MG) MG PO TABS
200.0000 mg | ORAL_TABLET | Freq: Two times a day (BID) | ORAL | Status: DC
  Administered 2018-08-08 – 2018-08-12 (×9): 200 mg via ORAL
  Filled 2018-08-08 (×9): qty 1

## 2018-08-08 MED ORDER — SACUBITRIL-VALSARTAN 24-26 MG PO TABS
1.0000 | ORAL_TABLET | Freq: Two times a day (BID) | ORAL | Status: DC
  Administered 2018-08-08 – 2018-08-12 (×9): 1 via ORAL
  Filled 2018-08-08 (×12): qty 1

## 2018-08-08 MED ORDER — POTASSIUM CHLORIDE CRYS ER 20 MEQ PO TBCR
20.0000 meq | EXTENDED_RELEASE_TABLET | Freq: Every day | ORAL | Status: DC
  Administered 2018-08-08: 20 meq via ORAL
  Filled 2018-08-08: qty 1

## 2018-08-08 MED ORDER — SODIUM CHLORIDE 0.9% FLUSH
3.0000 mL | Freq: Two times a day (BID) | INTRAVENOUS | Status: DC
  Administered 2018-08-08 – 2018-08-09 (×3): 3 mL via INTRAVENOUS

## 2018-08-08 MED ORDER — SODIUM CHLORIDE 0.9 % IV SOLN: 250.0000 mL | INTRAVENOUS | Status: DC | PRN

## 2018-08-08 MED ORDER — SODIUM CHLORIDE 0.9 % IV SOLN
INTRAVENOUS | Status: DC
  Administered 2018-08-09: 06:00:00 via INTRAVENOUS

## 2018-08-08 MED ORDER — ASPIRIN 81 MG PO CHEW
81.0000 mg | CHEWABLE_TABLET | ORAL | Status: AC
  Administered 2018-08-09: 81 mg via ORAL
  Filled 2018-08-08: qty 1

## 2018-08-08 MED ORDER — SODIUM CHLORIDE 0.9% FLUSH: 3.0000 mL | INTRAVENOUS | Status: DC | PRN

## 2018-08-08 NOTE — Progress Notes (Signed)
Received report from night shift nurse. Patient was described as ambulatory and able to do for himself. Upon assessment, patient was found resting in the bed comfortably. Patient was alert and oriented to self, place, and time; but not date or day of the week. Upon full assessment, it was found that the patient had multiple small lesions throughout lower extremities, and bruising. Patient complains of bilateral pain in both ankles, 5 out of 10. Upon auscultation, patient exhibits decreased lung sounds bilaterally, and fine crackles in right lower lobe. Patient expressed concern for impending cardiac catheterization tomorrow procedure preparations were made. Procedure was explained to patient and a signed consent was witnessed and obtained. Patient's Left peripheral IV did not produce blood return when flushed. In order to prepare for surgery tomorrow, another L peripheral IV was placed. All patient needs met throughout the day.  Note created by, Griffin Basil, Nurse Extern

## 2018-08-08 NOTE — Progress Notes (Addendum)
PROGRESS NOTE    Trevor Williams  PQZ:[id number] DOB: 1960-11-03 DOA: 07/28/2018 PCP: Marliss Coots, NP    Brief Narrative; 58 year old with past medical history significant for tobacco use disorder, alcohol use disorder and a skin cancer presenting with progressive shortness of breath and dyspnea on exertion for 1 month.  Has had intermittent chest pain.  EMS: Patient was placed on a nonrebreather, given epinephrine, Solu-Medrol, magnesium and albuterol and brought to the ED.  In the ED patient was wheezing and hypoxic.  ABG: pH seven-point 3/46/47/23.  BNP elevated 412.  CRP less than 0.8.  CBC with microcytic anemia at baseline, thrombocytopenia.  Troponin mildly elevated.  EKG with sinus rhythm with possible lateral wall ischemia.  D-dimer was elevated.  CTA showed multifocal pneumonia and changes consistent with emphysema and possible coronary artery disease.  COVID-19 test was negative.  Patient was admitted with acute respiratory failure with hypoxemia secondary to pneumonia and possible COPD exacerbation. During hospitalization patient became more hypoxic, developed alcohol withdrawal and was transferred to ICU.  He was intubated from 5/12 and extubated on 5/16.  Patient was a started on Precedex for alcohol withdrawal.  Patient was treated with with broad-spectrum antibiotics for pneumonia. TRH resume care on 5/17.  Patient has been also be treated for heart failure exacerbation, he received IV Lasix 20 mg IV twice daily.  His weight has been coming down.  He was transitioned to oral Lasix.  Echocardiogram was performed and is consistent with systolic heart failure, ejection fraction 15 to 20%.  Cardiology has been consulted. for further recommendations.   Assessment & Plan:   Principal Problem:   Acute respiratory failure with hypoxia (HCC) Active Problems:   Community acquired pneumonia  1-acute hypoxic respiratory failure: Multifactorial secondary to pneumonia, COPD exacerbation  and possible CHF exacerbation. Initial ABG showed compensated respiratory acidosis and hypoxemia. He is still requiring 1.5 L of oxygen. Continue with nebulizer. He has received IV Lasix 20 mg IV twice daily. His weight is down to 61 kg from 66.  On oral Lasix his symptoms 08/09/2018  2-Community-acquired pneumonia: Noted on CT a chest.  Patient completed course of antibiotics.  COVID-19 negative x2.  RVP negative.  Blood and sputum cultures negative.  3-COPD exacerbation, no formal diagnosis but he has a 40-pack-year history.  Current smoker.. -Continue with LAMA/LABA-Anoro Ellipta -finished solumedrol.  -PRN nebulizer.   4-Acute systolic HF exacerbation: Presented with worsening dyspnea on exertion for months.  BNP elevated.  Started on IV Lasix he had good response to diuretics.  He was transitioned to oral Lasix. -Echocardiogram with reduced ejection fraction 15 to 20%, diffuse wall motion abnormality. -Cardiology has been consulted, will defer to cardiology if starting Entresto or ARB.   5-EKG changes, new T wave changes compared to prior EKG.  He denies any recent chest pain.  Reports chest pain weeks ago.  Enzymes negative. EKG this morning with less pronounced T wave inversion.  Improve after correction of hypomagnesemia and hypokalemia.. Cardiology has been consulted due to history of chest pain and new diagnosis of cardiomyopathy with low ejection fraction.  6-alcohol use disorder/alcohol withdrawal: He reports drinking about half a paint of liquor a day. ETT from 5/12 to 5/16. Continue with multivitamins, thiamine and folic acid. He was treated with Precedex in the ICU.  Thrombocytopenia: Likely related to alcohol use monitor.  Severe protein caloric malnutrition.  Likely due to acute on chronic illness, alcohol use disorder.  Appreciate nutrition's input  Delirium: Impulsive and disoriented  intermittently overnight. His Klonopin was reduced to 0.5 twice daily. Appears more  calm today.  hypoKalemia/hypomagnesemia: Replace. Will start oral magnesium supplement. We will start oral potassium supplement while he is on Lasix.  Nutrition Problem: Increased nutrient needs Etiology: chronic illness(COPD, emphysema)    Signs/Symptoms: estimated needs    Interventions: MVI, Ensure Enlive (each supplement provides 350kcal and 20 grams of protein)  Estimated body mass index is 17.92 kg/m as calculated from the following:   Height as of this encounter: 5\' 7"  (1.702 m).   Weight as of this encounter: 51.9 kg.   DVT prophylaxis: SCDs Code Status; full code Family Communication: Care discussed with patient Disposition Plan: Patient with new diagnosis of systolic heart failure ejection fraction 15 to 20%, prior history of chest pain.  Patient will require cardiology evaluation for new systolic heart failure.  Consultants:  CCM   Procedures:   ETT 5/12-5/16   Antimicrobials:  COVID (in house) 5/10 >> negative RVP 5/10 >> negative Blood 5/10 >> negative COVID (send out) 5/11 >> negative Sputum 5/12 >> normal flora   Subjective: He denies chest pain.  He is breathing better.  Objective: Vitals:   08/07/18 0805 08/07/18 2140 08/07/18 2351 08/08/18 0715  BP: 118/82 (!) 126/93 121/78 119/79  Pulse: (!) 102 100 99 (!) 102  Resp: 18   18  Temp: 99.5 F (37.5 C) 98.4 F (36.9 C)  99.3 F (37.4 C)  TempSrc: Oral Oral Oral Oral  SpO2: 95% 94% 94% 94%  Weight:      Height:        Intake/Output Summary (Last 24 hours) at 08/08/2018 1038 Last data filed at 08/08/2018 0956 Gross per 24 hour  Intake 50 ml  Output 1101 ml  Net -1051 ml   Filed Weights   08/05/18 0300 08/06/18 0500 08/07/18 0459  Weight: 67.7 kg 66.7 kg 51.9 kg    Examination:  General exam: Not acute distress Respiratory system: Crackles at the bases Cardiovascular system: S1, S2 regular rhythm and rate Gastrointestinal system: Bowel sounds present, soft, nontender  nondistended Central nervous system: Nonfocal Extremities: Symmetric power Skin: No rashes    Data Reviewed: I have personally reviewed following labs and imaging studies  CBC: Recent Labs  Lab 08/02/18 0216 08/03/18 0255 08/04/18 0450 08/05/18 0606 08/06/18 0443 08/07/18 0527  WBC 6.3 6.5 14.3* 8.9  --   --   NEUTROABS 5.0  --   --   --   --   --   HGB 9.7* 10.0* 9.9* 9.4* 9.2* 10.3*  HCT 29.5* 30.3* 29.5* 28.4* 27.7* 31.4*  MCV 112.6* 111.4* 110.9* 111.8*  --   --   PLT 42* 63* 90* 101*  --   --    Basic Metabolic Panel: Recent Labs  Lab 08/02/18 0216  08/04/18 0450 08/05/18 0606 08/06/18 0443 08/07/18 0527 08/08/18 0224  NA 138   < > 138 141 143 139 138  K 4.2   < > 3.8 3.5 3.9 3.1* 3.9  CL 109   < > 107 106 104 100 104  CO2 24   < > 22 25 26 29 25   GLUCOSE 133*   < > 123* 172* 137* 94 112*  BUN 11   < > 17 20 25* 20 16  CREATININE 0.69   < > 0.77 0.93 0.91 0.83 0.74  CALCIUM 7.8*   < > 8.6* 8.3* 8.5* 8.3* 8.2*  MG 1.8  --  2.0  --  2.0 1.7 1.9  PHOS 2.9  --  4.3  --   --   --   --    < > = values in this interval not displayed.   GFR: Estimated Creatinine Clearance: 74.8 mL/min (by C-G formula based on SCr of 0.74 mg/dL). Liver Function Tests: No results for input(s): AST, ALT, ALKPHOS, BILITOT, PROT, ALBUMIN in the last 168 hours. No results for input(s): LIPASE, AMYLASE in the last 168 hours. No results for input(s): AMMONIA in the last 168 hours. Coagulation Profile: No results for input(s): INR, PROTIME in the last 168 hours. Cardiac Enzymes: Recent Labs  Lab 08/07/18 1449 08/07/18 2047 08/08/18 0224  TROPONINI 0.03* 0.03* 0.03*   BNP (last 3 results) No results for input(s): PROBNP in the last 8760 hours. HbA1C: No results for input(s): HGBA1C in the last 72 hours. CBG: Recent Labs  Lab 08/07/18 0827 08/07/18 1213 08/07/18 1633 08/07/18 2137 08/08/18 0739  GLUCAP 82 98 104* 84 92   Lipid Profile: No results for input(s): CHOL, HDL,  LDLCALC, TRIG, CHOLHDL, LDLDIRECT in the last 72 hours. Thyroid Function Tests: No results for input(s): TSH, T4TOTAL, FREET4, T3FREE, THYROIDAB in the last 72 hours. Anemia Panel: No results for input(s): VITAMINB12, FOLATE, FERRITIN, TIBC, IRON, RETICCTPCT in the last 72 hours. Sepsis Labs: No results for input(s): PROCALCITON, LATICACIDVEN in the last 168 hours.  Recent Results (from the past 240 hour(s))  Culture, sputum-assessment     Status: None   Collection Time: 07/29/18  3:14 PM  Result Value Ref Range Status   Specimen Description EXPECTORATED SPUTUM  Final   Special Requests NONE  Final   Sputum evaluation   Final    Sputum specimen not acceptable for testing.  Please recollect.   RESULT CALLED TO, READ BACK BY AND VERIFIED WITH: Brennan Bailey RN [id number] 07/29/18 A BROWNING Performed at [facility], [place] [address] [place], [place] [address]    Report Status 07/29/2018 FINAL  Final  MRSA PCR Screening     Status: None   Collection Time: 07/30/18 10:00 AM  Result Value Ref Range Status   MRSA by PCR NEGATIVE NEGATIVE Final    Comment:        The GeneXpert MRSA Assay (FDA approved for NASAL specimens only), is one component of a comprehensive MRSA colonization surveillance program. It is not intended to diagnose MRSA infection nor to guide or monitor treatment for MRSA infections. Performed at [facility], [place] [address], [place], [place] [address]   Culture, respiratory (non-expectorated)     Status: None   Collection Time: 07/30/18 10:21 AM  Result Value Ref Range Status   Specimen Description TRACHEAL ASPIRATE  Final   Special Requests NONE  Final   Gram Stain NO WBC SEEN NO ORGANISMS SEEN   Final   Culture   Final    RARE Consistent with normal respiratory flora. Performed at [facility], [place] [address] [place],  [address]    Report Status 08/01/2018 FINAL  Final  Novel Coronavirus, NAA (hospital order; send-out to ref lab)     Status:  None   Collection Time: 08/05/18  5:35 PM  Result Value Ref Range Status   SARS-CoV-2, NAA NOT DETECTED NOT DETECTED Final    Comment: (NOTE) This test was developed and its performance characteristics determined by [employer]. This test has not been FDA cleared or approved. This test has been authorized by FDA under an Emergency Use Authorization (EUA). This test is only  authorized for the duration of time the declaration that circumstances exist justifying the authorization of the emergency use of in vitro diagnostic tests for detection of SARS-CoV-2 virus and/or diagnosis of COVID-19 infection under section 564(b)(1) of the Act, 21 U.S.C. 335KTG-2(B)(6), unless the authorization is terminated or revoked sooner. When diagnostic testing is negative, the possibility of a false negative result should be considered in the context of a patient's recent exposures and the presence of clinical signs and symptoms consistent with COVID-19. An individual without symptoms of COVID-19 and who is not shedding SARS-CoV-2 virus would expect to have a negative (not detected) result in this assay. Performed  At: [facility] [address] [place], [place] [id number] Rush Farmer MD JG:[id number]    [place]  Final    Comment: Performed at [facility], [place] [address] [place], [place] [address]         Radiology Studies: No results found.      Scheduled Meds: . Chlorhexidine Gluconate Cloth  6 each Topical Q0600  . famotidine  20 mg Oral BID  . feeding supplement (ENSURE ENLIVE)  237 mL Oral TID BM  . folic acid  1 mg Oral Daily  . furosemide  20 mg Oral Daily  . guaiFENesin  5 mL Oral BID  . magnesium oxide  200 mg Oral BID  . mouth rinse  15 mL Mouth Rinse BID  . multivitamin with minerals  1 tablet Oral Daily  . nicotine  21 mg Transdermal Daily  . potassium chloride  20 mEq Oral Daily  . potassium chloride  40 mEq Oral BID   . thiamine  100 mg Oral Daily  . traZODone  50 mg Oral QHS  . umeclidinium-vilanterol  1 puff Inhalation Daily   Continuous Infusions: . sodium chloride 10 mL/hr at 08/01/18 2200     LOS: 10 days    Time spent: 35 minutes.     Elmarie Shiley, MD [facility] Pager [phone]  If 7PM-7AM, please contact night-coverage [email or web address] Password [facility] 08/08/2018, 10:38 AM

## 2018-08-08 NOTE — H&P (View-Only) (Signed)
Cardiology Consult    Patient ID: Trevor Williams MRN: [id number], DOB/AGE: [birth year]   Admit date: 07/28/2018 Date of Consult: 08/08/2018  Primary Physician: Marliss Coots, NP Primary Cardiologist: New to [facility] (Dr. Aundra Dubin) Requesting Provider: Niel Hummer, MD  Patient Profile    Trevor Williams is a 58 y.o. homeless male with a history of COPD, self-reported liver cirrhosis, tobacco use, and alcohol use, who is being seen today for the evaluation of CHF at the request of Dr. Tyrell Antonio.  History of Present Illness    [name] is a 58 year old homeless male with a history of COPD, self-reported liver cirrhosis, tobacco use, and alcohol abuse.  Patient presented to the ED via EMS on 07/28/2018 for evaluation of shortness of breath. Upon EMS arrival, patient was found to have O2 sats in the 80's and was placed on 15 L via non-rebreather mask. He was given IM Epinephrine, Solu-Medrol, Albuterol, and 2g of Magnesium in route to the ED.   In the ED, patient tachypneic, tachycardic, and on 2L of nasal cannula to maintain O2 sats. EKG showed sinus rhythm with artifact. Initial troponin negative. Chest x-ray showed no acute findings. BNP elevated at 412.6. D-dimer elevated at 1.15. Chest CTA showed no evidence of pulmonary embolism but revealed clustered centrilobular and ground-glass opacity of the right upper lober and of the superior segment of the left lower lobe consistent with multifocal infection as well as diffuse bilateral non-specific infectious or inflammatory bronchial wall thickening. WBC 4.5. Hgb 11.2, Plts 47. Na 144, K 3.9, Glucose 98, SCr 0.59.   Patient was admitted for further evaluation and management of acute respiratory failure. He was started on antibiotics for community acquired pneumonia. COVID-19 testing negative x2. Respiratory panel negative. Patient developed alcohol withdrawal and new respiratory distress on 07/30/2018. Repeat chest x-ray showed new large right lung  multilobar pneumonia and/or aspiration so patient was transferred to the ICU where he was intubated. He was noted to be hypotensive with systolic BP in the 16'W and was started on Levophed which was discontinued on 08/01/2018. He was able to be extubated on 08/03/2018 and was able to be transferred out of the ICU the following day. Echocardiogram showed LVEF of 15-20% with diffuse hypokinesis and grade 2 diastolic dysfunction as well as severely reduced RV systolic function. Cardiology was consulted for further evaluation.  Patient is somewhat of a difficult historian but reports progressive dyspnea on exertion, orthopnea, PND, and lower extremity edema over the last couple of months. He denies any significant abdominal distension or weight gain. Patient also report chest pain over the past couple of months both at rest and with exertion but no worse with exertion. He describes the pain as stabbing feeling and states it is worse when sitting up compared to lying down and is sometimes worse with meals. He states some improvement with medicine here in the hospital but could not remember what medicine it was. He states it was not Nitroglycerin though. He notes occasional palpitations, lightheadedness, and dizziness, but denies any syncope. He has a dry cough for while but denies any other cold symptoms, fever, chills, or body aches.   At the time of this evaluation, patient denies any shortness of breath or chest pain. His only complaint at this time is a headache.  Patient has a 40 pack year smoking history and drinks 1/5 of Vodka every day. He also reports occasional marijuana use (last used 1 month ago) but denies any other illicit drug use.  He reports his paternal grandfather had a heart attack in his 30's but no other known family history of heart disease.  Past Medical History   Past Medical History:  Diagnosis Date  . Cancer ([place])    skin (left hand)  . Emphysema of lung ([place])     History reviewed.  No pertinent surgical history.   Allergies  Allergies  Allergen Reactions  . Doxycycline Rash    Rash noted after administration of vancomycin, doxycycline, and ceftriaxone. Unclear cause of rash.  . Ibuprofen Rash  . Rocephin [Ceftriaxone] Rash    Rash noted after administration of vancomycin, doxycycline, and ceftriaxone. Unclear cause of rash.  . Tylenol [Acetaminophen] Rash  . Vancomycin Rash    Rash noted after administration of vancomycin, doxycycline, and ceftriaxone. Unclear cause of rash.    Inpatient Medications    . Chlorhexidine Gluconate Cloth  6 each Topical Q0600  . famotidine  20 mg Oral BID  . feeding supplement (ENSURE ENLIVE)  237 mL Oral TID BM  . folic acid  1 mg Oral Daily  . furosemide  20 mg Oral Daily  . guaiFENesin  5 mL Oral BID  . magnesium oxide  200 mg Oral BID  . mouth rinse  15 mL Mouth Rinse BID  . multivitamin with minerals  1 tablet Oral Daily  . nicotine  21 mg Transdermal Daily  . potassium chloride  20 mEq Oral Daily  . potassium chloride  40 mEq Oral BID  . thiamine  100 mg Oral Daily  . traZODone  50 mg Oral QHS  . umeclidinium-vilanterol  1 puff Inhalation Daily    Family History    Family History  Problem Relation Age of Onset  . Hypertension Maternal Grandfather    He indicated that the status of his maternal grandfather is unknown.   Social History    Social History   Socioeconomic History  . Marital status: Single    Spouse name: Not on file  . Number of children: Not on file  . Years of education: Not on file  . Highest education level: Not on file  Occupational History  . Not on file  Social Needs  . Financial resource strain: Not on file  . Food insecurity:    Worry: Not on file    Inability: Not on file  . Transportation needs:    Medical: Not on file    Non-medical: Not on file  Tobacco Use  . Smoking status: Current Every Day Smoker    Packs/day: 1.00    Types: Cigarettes  . Smokeless tobacco:  Current User  Substance and Sexual Activity  . Alcohol use: Yes    Comment: Liquor daily.  . Drug use: No  . Sexual activity: Not on file  Lifestyle  . Physical activity:    Days per week: Not on file    Minutes per session: Not on file  . Stress: Not on file  Relationships  . Social connections:    Talks on phone: Not on file    Gets together: Not on file    Attends religious service: Not on file    Active member of club or organization: Not on file    Attends meetings of clubs or organizations: Not on file    Relationship status: Not on file  . Intimate partner violence:    Fear of current or ex partner: Not on file    Emotionally abused: Not on file    Physically abused: Not  on file    Forced sexual activity: Not on file  Other Topics Concern  . Not on file  Social History Narrative  . Not on file     Review of Systems    Review of Systems  Constitutional: Negative for chills and fever.  HENT: Negative for congestion.   Respiratory: Positive for cough and shortness of breath. Negative for hemoptysis, sputum production and stridor.   Cardiovascular: Positive for chest pain, palpitations, orthopnea, leg swelling and PND.  Gastrointestinal: Positive for abdominal pain (occasionally). Negative for blood in stool, nausea and vomiting.  Genitourinary: Negative for hematuria.  Musculoskeletal: Negative for myalgias.  Neurological: Positive for dizziness and headaches. Negative for loss of consciousness.  Endo/Heme/Allergies: Does not bruise/bleed easily.  Psychiatric/Behavioral: Positive for substance abuse (tobacco, alcohol, and marijuana).    Physical Exam    Blood pressure 119/79, pulse (!) 102, temperature 99.3 F (37.4 C), temperature source Oral, resp. rate 18, height 5\' 7"  (1.702 m), weight 51.9 kg, SpO2 94 %.   In an effort to minimize exposure given the current COVID-19 pandemic, I called into the patient's room to obtain a history and did not physically examine  the patient. On the phone, patient did not sound like he was in any acute distress. He was able to talk in complete sentences without any labored breathing.  MD to examine.  Labs    Troponin (Point of Care Test) No results for input(s): TROPIPOC in the last 72 hours. Recent Labs    08/07/18 1449 08/07/18 2047 08/08/18 0224  TROPONINI 0.03* 0.03* 0.03*   Lab Results  Component Value Date   WBC 8.9 08/05/2018   HGB 10.3 (L) 08/07/2018   HCT 31.4 (L) 08/07/2018   MCV 111.8 (H) 08/05/2018   PLT 101 (L) 08/05/2018    Recent Labs  Lab 08/08/18 0224  NA 138  K 3.9  CL 104  CO2 25  BUN 16  CREATININE 0.74  CALCIUM 8.2*  GLUCOSE 112*   No results found for: CHOL, HDL, LDLCALC, TRIG Lab Results  Component Value Date   DDIMER 1.15 (H) 07/28/2018     Radiology Studies    Dg Chest 1 View  Result Date: 07/30/2018 CLINICAL DATA:  58 year old male with COVID-19 status pending. Intubated. EXAM: CHEST  1 VIEW COMPARISON:  0413 hours today and earlier. FINDINGS: Portable AP semi upright view at 0904 hours. Endotracheal tube tip in good position between the level the clavicles and carina. The patient is mildly rotated to the right. Continued confluent right mid and lower lung consolidation. No pneumothorax or pulmonary edema. The left lung remains clear. Stable cardiac size and mediastinal contours. IMPRESSION: 1. Endotracheal tube tip in good position. 2. Continued confluent right mid and lower lung consolidation sen earlier today. Electronically Signed   By: Genevie Ann M.D.   On: 07/30/2018 09:42   Ct Angio Chest Pe W And/or Wo Contrast  Result Date: 07/28/2018 CLINICAL DATA:  Shortness of breath, evaluate for PE EXAM: CT ANGIOGRAPHY CHEST WITH CONTRAST TECHNIQUE: Multidetector CT imaging of the chest was performed using the standard protocol during bolus administration of intravenous contrast. Multiplanar CT image reconstructions and MIPs were obtained to evaluate the vascular anatomy.  CONTRAST:  156mL OMNIPAQUE IOHEXOL 350 MG/ML SOLN COMPARISON:  12/24/2014 FINDINGS: Cardiovascular: Satisfactory opacification of the pulmonary arteries to the segmental level. No evidence of pulmonary embolism. Scattered coronary artery calcifications. Normal heart size. No pericardial effusion. Mediastinum/Nodes: No enlarged mediastinal, hilar, or axillary lymph nodes. Thyroid gland,  trachea, and esophagus demonstrate no significant findings. Lungs/Pleura: Moderate centrilobular emphysema. There is clustered centrilobular and ground-glass opacity of the right upper lobe (series 7, image 76) and of the superior segment left lower lobe (series 7, image 82). Diffuse bilateral bronchial wall thickening. No pleural effusion or pneumothorax. Upper Abdomen: No acute abnormality. Musculoskeletal: No chest wall abnormality. No acute or significant osseous findings. Review of the MIP images confirms the above findings. IMPRESSION: 1.  Negative examination for pulmonary embolism. 2. There is clustered centrilobular and ground-glass opacity of the right upper lobe (series 7, image 76) and of the superior segment left lower lobe (series 7, image 82), consistent with multifocal infection. 3. Diffuse bilateral nonspecific infectious or inflammatory bronchial wall thickening. 4.  Emphysema. 5.  Coronary artery disease. Electronically Signed   By: Eddie Candle M.D.   On: 07/28/2018 21:58   Dg Chest Port 1 View  Result Date: 08/03/2018 CLINICAL DATA:  Respiratory failure EXAM: PORTABLE CHEST 1 VIEW COMPARISON:  Aug 02, 2018 FINDINGS: The ETT terminates in the mid trachea. The NG tube terminates below today's film. The cardiomediastinal silhouette is stable. Opacity in the right lung is stable, likely a combination of layering effusion and underlying infiltrate. More mild opacity in left base is stable as well. No other interval changes. IMPRESSION: 1. Stable support apparatus as above. 2. Bilateral pulmonary opacities, right  greater than left, persist, unchanged. The opacity on the right is likely a combination of pleural fluid layering with underlying parenchymal opacity which could represent atelectasis or pneumonia. Electronically Signed   By: Dorise Bullion III M.D   On: 08/03/2018 08:10   Dg Chest Port 1 View  Result Date: 08/02/2018 CLINICAL DATA:  Respiratory failure EXAM: PORTABLE CHEST 1 VIEW COMPARISON:  08/01/2018 FINDINGS: Cardiac shadow is stable. Endotracheal tube and gastric catheter are seen in satisfactory position. Increasing opacity in the right lung is noted likely related to a combination of infiltrate and posterior effusion. New patchy density in the left base is noted. No bony abnormality is seen. IMPRESSION: Bilateral airspace opacities increasing when compared with the prior exam right greater than left. Tubes and lines as described. Electronically Signed   By: Inez Catalina M.D.   On: 08/02/2018 07:59   Dg Chest Port 1 View  Result Date: 08/01/2018 CLINICAL DATA:  Respiratory failure EXAM: PORTABLE CHEST 1 VIEW COMPARISON:  07/31/2018 FINDINGS: Cardiac shadow is stable. Endotracheal tube and gastric catheter are again seen and stable. Increasing confluent density in the right mid and lower lung is again noted. Small right-sided pleural effusion is seen. Mild interstitial edema is noted as well. No pneumothorax is noted. Left lung remains clear. IMPRESSION: Increasing opacity in the right lung. Electronically Signed   By: Inez Catalina M.D.   On: 08/01/2018 07:56   Dg Chest Port 1 View  Result Date: 07/31/2018 CLINICAL DATA:  58 year old male with respiratory failure. Negative for COVID-19. EXAM: PORTABLE CHEST 1 VIEW COMPARISON:  07/30/2018 and earlier. FINDINGS: Portable AP semi upright view at 0529 hours. Stable endotracheal tube. Enteric tube has been placed and courses to the abdomen, tip not included. Regressed but not resolved confluent right mid and lower lung opacity since 0403 hours  yesterday. Stable lung volumes. Stable cardiac size and mediastinal contours. No pneumothorax. No pleural effusion is evident. The left lung remains clear. IMPRESSION: 1. Stable ETT.  Enteric tube placed. 2. Regressed but not resolved confluent right mid and lower lung opacity since yesterday. 3. No new cardiopulmonary abnormality. Electronically  Signed   By: Genevie Ann M.D.   On: 07/31/2018 07:44   Dg Chest Port 1 View  Result Date: 07/30/2018 CLINICAL DATA:  58 year old male with hypoxia. EXAM: PORTABLE CHEST 1 VIEW COMPARISON:  CTA chest 07/28/2018 and earlier. FINDINGS: Portable AP upright view at 0403 hours new large area of confluent opacity and consolidation in the right mid and lower lung since 07/28/2018. No superimposed pneumothorax. No pleural effusion is evident. The left lung remain stable. Stable cardiac size and mediastinal contours. Visualized tracheal air column is within normal limits. Stable visualized osseous structures. Negative visible bowel gas pattern. IMPRESSION: Large right lung multilobar pneumonia and/or aspiration is new since 07/28/2018. These results will be called to the ordering clinician or representative by the Radiologist Assistant, and communication documented in the PACS or zVision Dashboard. Electronically Signed   By: Genevie Ann M.D.   On: 07/30/2018 04:48   Dg Chest Portable 1 View  Result Date: 07/28/2018 CLINICAL DATA:  Shortness of breath and cough EXAM: PORTABLE CHEST 1 VIEW COMPARISON:  06/01/2018 FINDINGS: Cardiac shadow is stable. Aortic calcifications are again seen. The lungs are well aerated bilaterally. No acute infiltrate is seen. Old rib fractures are noted on the left. IMPRESSION: No active disease. Electronically Signed   By: Inez Catalina M.D.   On: 07/28/2018 20:34   Dg Abd Portable 1v  Result Date: 07/30/2018 CLINICAL DATA:  Orogastric tube placement EXAM: PORTABLE ABDOMEN - 1 VIEW COMPARISON:  CT abdomen and pelvis December 24, 2014 FINDINGS: Orogastric  tube tip and side port are in the stomach. There is no bowel dilatation or air-fluid level to suggest bowel obstruction. No free air. Liver appears enlarged. There is a right pleural effusion with ill-defined airspace opacity in the visualized right lung regions. There old healed rib fractures on the left. IMPRESSION: Orogastric tube tip and side port in stomach. No bowel obstruction or free air evident. Liver appears enlarged. Right pleural effusion with airspace opacity concerning for a degree of pneumonia in right lower lung region. Electronically Signed   By: Lowella Grip III M.D.   On: 07/30/2018 15:02    EKG     EKG: EKG was personally reviewed and demonstrates: NSR, anterolateral TWIs  Cardiac Imaging    Echocardiogram 08/04/2018: Impressions: 1. The right ventricle has severely reduced systolic function. The cavity was moderately enlarged. There is no increase in right ventricular wall thickness.  2. Left atrial size was mildly dilated.  3. Right atrial size was mildly dilated.  4. The mitral valve is degenerative. Mild thickening of the mitral valve leaflet. Mild calcification of the mitral valve leaflet. There is mild to moderate mitral annular calcification present. Mitral valve regurgitation is mild to moderate by color  flow Doppler.  5. The aortic valve is tricuspid. Mild thickening of the aortic valve. No stenosis of the aortic valve.  6. Tricuspid valve regurgitation is moderate.  7. Trivial pericardial effusion is present.  8. When compared to the prior study: No prior echocardiogram available for comparison.  Summary: Left ventricle is moderately dilated with severe systolic dysfunction, estimated LVEF 15-20% with diffuse hypokinesis, grade 2 diastolic dysfunction and elevated filling pressures. RVEF is severely reduced.  Assessment & Plan    Newly Diagnosed Systolic CHF with Biventricular Dysfunction - Patient was admitted with acute respiratory failure secondary to  CAP and COPD exacerbation requiring intubation. Patient was intubated on 07/30/2018 and extubated on 08/03/2018. - BNP elevated at 412.6 on admission. - Echo this admission showed  LVEF of 15-20% with diffuse hypokinesis, grade 2 diastolic dysfunction, and elevated filling pressures as well as a severely reduced RVEF.  - Patient was previously on IV Lasix 20mg  twice daily but this was transitioned to PO Lasix 20mg  daily today. Net positive 12 L this admission. - Will defer additional diuresis to MD who will assess volume status.  - Will ultimately need to start evidence based heart failure medications with ACEi/ARB/ARN, beta-blocker, and possibly spironolactone. However, patient is homeless and currently drinks 1/5 Vodka daily so compliance may be an issue. - Possibly secondary to alcohol abuse but patient does report some chest and need to rule out ischemic etiology. Anticipate patient will need right/left heart catheterization for further evaluation. - Continue to monitor daily weight, strict I/O's, and renal function.  Chest Pain - Patient reports atypical chest pain for the past couple of months that he describes as a stabbing feeling. Occurs at rest and with exertion but is not worse with exertion. - Unable to review EKG remotely. MD to review. - Troponin borderline positive and flat at <0.03, <0.03, <0.03, 0.03, 0.03, 0.03, 0.03. Not consistent with ACS. Suspect demand ischemia secondary to acute respiratory failure and severely reduced LVEF. - Anticipate right/left heart catheterization as stated above.  Acute Respiratory Failure/ CAP/ COPD - Intubated on 07/30/2018 and extubated on 08/03/2018. - Management per primary team.  Tobacco Abuse - Patient has a 40 pack year smoking history and smoked 1 pack per day before being admitted. - Complete cessation is advised.  Alcohol Abuse - Patient drinks 1/5 of Vodka daily. He had alcohol withdrawal earlier this admission. - Discussed the  importance of cessation with severely reduced LVEF. - Continue CIWA protocol.  Homelessness - [place]. - Education officer, museum already involved.   Otherwise, per primary team.  Signed, Darreld Arrin Ishler, PA-C 08/08/2018, 8:51 AM Pager: [phone] For questions or updates, please contact   Please consult [email or web address] for contact info under Cardiology/STEMI.  Patient seen with PA, agree with the above note.   History of hospitalization is well-documented above.  He was admitted with PNA and now extubated and doing well.  Had ETOH withdrawal as well.  Echo done, showing EF 15-20% with severely decreased RV systolic function. He is a smoker, heavy drinker, and homeless.  He does describe occasional sharp, exertional chest pain prior to admission.   General: NAD Neck: No JVD, no thyromegaly or thyroid nodule.  Lungs: Rhonchi bilaterally.  CV: Lateral PMI.  Heart regular S1/S2, no S3/S4, no murmur.  No peripheral edema.  No carotid bruit.  Normal pedal pulses.  Abdomen: Soft, nontender, no hepatosplenomegaly, no distention.  Skin: Intact without lesions or rashes.  Neurologic: Alert and oriented x 3.  Psych: Normal affect. Extremities: No clubbing or cyanosis.  HEENT: Normal.   1. Acute on chronic systolic CHF: Newly noted cardiomyopathy, echo with EF 15-20% with moderate LV dilation, severely decreased RV systolic function.  He is not volume overloaded on exam.  It is possible that his cardiomyopathy is due to heavy ETOH use.  He does have a history of exertional chest discomfort, so will need to have CAD ruled out.  On exam, he does not look volume overloaded currently.  - Continue Lasix 20 mg po daily . - Start Entresto 24/26 bid.  - LHC/RHC tomorrow to assess cardiac output/filling pressures and to look for CAD.  Discussed risks/benefits with patient and he agrees to procedure.  - Needs total abstinence from  ETOH.  2. Community-acquired PNA: He has completed  antibiotic course.  3. COPD: needs to quit smoking, discussed today.  4. ETOH abuse: had withdrawal this admission.  Discussed cessation.  5. Homelessness: Social work assistance.   Loralie Champagne 08/08/2018 12:07 PM

## 2018-08-08 NOTE — Consult Note (Signed)
Cardiology Consult    Patient ID: Trevor Williams MRN: [id number], DOB/AGE: 1960/09/22   Admit date: 07/28/2018 Date of Consult: 08/08/2018  Primary Physician: Marliss Coots, NP Primary Cardiologist: New to [facility] (Dr. Aundra Dubin) Requesting Provider: Niel Hummer, MD  Patient Profile    Trevor Williams is a 58 y.o. homeless male with a history of COPD, self-reported liver cirrhosis, tobacco use, and alcohol use, who is being seen today for the evaluation of CHF at the request of Dr. Tyrell Antonio.  History of Present Illness    Mr. 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  Patient presented to the ED via EMS on 07/28/2018 for evaluation of shortness of breath. Upon EMS arrival, patient was found to have O2 sats in the 80's and was placed on 15 L via non-rebreather mask. He was given IM Epinephrine, Solu-Medrol, Albuterol, and 2g of Magnesium in route to the ED.   In the ED, patient tachypneic, tachycardic, and on 2L of nasal cannula to maintain O2 sats. EKG showed sinus rhythm with artifact. Initial troponin negative. Chest x-ray showed no acute findings. BNP elevated at 412.6. D-dimer elevated at 1.15. Chest CTA showed no evidence of pulmonary embolism but revealed clustered centrilobular and ground-glass opacity of the right upper lober and of the superior segment of the left lower lobe consistent with multifocal infection as well as diffuse bilateral non-specific infectious or inflammatory bronchial wall thickening. WBC 4.5. Hgb 11.2, Plts 47. Na 144, K 3.9, Glucose 98, SCr 0.59.   Patient was admitted for further evaluation and management of acute respiratory failure. He was started on antibiotics for community acquired pneumonia. COVID-19 testing negative x2. Respiratory panel negative. Patient developed alcohol withdrawal and new respiratory distress on 07/30/2018. Repeat chest x-ray showed new large right lung  multilobar pneumonia and/or aspiration so patient was transferred to the ICU where he was intubated. He was noted to be hypotensive with systolic BP in the 50'D and was started on Levophed which was discontinued on 08/01/2018. He was able to be extubated on 08/03/2018 and was able to be transferred out of the ICU the following day. Echocardiogram showed LVEF of 15-20% with diffuse hypokinesis and grade 2 diastolic dysfunction as well as severely reduced RV systolic function. Cardiology was consulted for further evaluation.  Patient is somewhat of a difficult historian but reports progressive dyspnea on exertion, orthopnea, PND, and lower extremity edema over the last couple of months. He denies any significant abdominal distension or weight gain. Patient also report chest pain over the past couple of months both at rest and with exertion but no worse with exertion. He describes the pain as stabbing feeling and states it is worse when sitting up compared to lying down and is sometimes worse with meals. He states some improvement with medicine here in the hospital but could not remember what medicine it was. He states it was not Nitroglycerin though. He notes occasional palpitations, lightheadedness, and dizziness, but denies any syncope. He has a dry cough for while but denies any other cold symptoms, fever, chills, or body aches.   At the time of this evaluation, patient denies any shortness of breath or chest pain. His only complaint at this time is a headache.  Patient has a 40 pack year smoking history and drinks 1/5 of Vodka every day. He also reports occasional marijuana use (last used 1 month ago) but denies any other illicit drug use.  He reports his paternal grandfather had a heart attack in his 65's but no other known family history of heart disease.  Past Medical History   Past Medical History:  Diagnosis Date   Cancer ([place])    skin (left hand)   Emphysema of lung ([place])     History reviewed.  No pertinent surgical history.   Allergies  Allergies  Allergen Reactions   Doxycycline Rash    Rash noted after administration of vancomycin, doxycycline, and ceftriaxone. Unclear cause of rash.   Ibuprofen Rash   Rocephin [Ceftriaxone] Rash    Rash noted after administration of vancomycin, doxycycline, and ceftriaxone. Unclear cause of rash.   Tylenol [Acetaminophen] Rash   Vancomycin Rash    Rash noted after administration of vancomycin, doxycycline, and ceftriaxone. Unclear cause of rash.    Inpatient Medications     Chlorhexidine Gluconate Cloth  6 each Topical Q0600   famotidine  20 mg Oral BID   feeding supplement (ENSURE ENLIVE)  237 mL Oral TID BM   folic acid  1 mg Oral Daily   furosemide  20 mg Oral Daily   guaiFENesin  5 mL Oral BID   magnesium oxide  200 mg Oral BID   mouth rinse  15 mL Mouth Rinse BID   multivitamin with minerals  1 tablet Oral Daily   nicotine  21 mg Transdermal Daily   potassium chloride  20 mEq Oral Daily   potassium chloride  40 mEq Oral BID   thiamine  100 mg Oral Daily   traZODone  50 mg Oral QHS   umeclidinium-vilanterol  1 puff Inhalation Daily    Family History    Family History  Problem Relation Age of Onset   Hypertension Maternal Grandfather    He indicated that the status of his maternal grandfather is unknown.   Social History    Social History   Socioeconomic History   Marital status: Single    Spouse name: Not on file   Number of children: Not on file   Years of education: Not on file   Highest education level: Not on file  Occupational History   Not on file  Social Needs   Financial resource strain: Not on file   Food insecurity:    Worry: Not on file    Inability: Not on file   Transportation needs:    Medical: Not on file    Non-medical: Not on file  Tobacco Use   Smoking status: Current Every Day Smoker    Packs/day: 1.00    Types: Cigarettes   Smokeless tobacco:  Current User  Substance and Sexual Activity   Alcohol use: Yes    Comment: Liquor daily.   Drug use: No   Sexual activity: Not on file  Lifestyle   Physical activity:    Days per week: Not on file    Minutes per session: Not on file   Stress: Not on file  Relationships   Social connections:    Talks on phone: Not on file    Gets together: Not on file    Attends religious service: Not on file    Active member of club or organization: Not on file    Attends meetings of clubs or organizations: Not on file    Relationship status: Not on file   Intimate partner violence:    Fear of current or ex partner: Not on file    Emotionally abused: Not on file    Physically abused: Not  on file    Forced sexual activity: Not on file  Other Topics Concern   Not on file  Social History Narrative   Not on file     Review of Systems    Review of Systems  Constitutional: Negative for chills and fever.  HENT: Negative for congestion.   Respiratory: Positive for cough and shortness of breath. Negative for hemoptysis, sputum production and stridor.   Cardiovascular: Positive for chest pain, palpitations, orthopnea, leg swelling and PND.  Gastrointestinal: Positive for abdominal pain (occasionally). Negative for blood in stool, nausea and vomiting.  Genitourinary: Negative for hematuria.  Musculoskeletal: Negative for myalgias.  Neurological: Positive for dizziness and headaches. Negative for loss of consciousness.  Endo/Heme/Allergies: Does not bruise/bleed easily.  Psychiatric/Behavioral: Positive for substance abuse (tobacco, alcohol, and marijuana).    Physical Exam    Blood pressure 119/79, pulse (!) 102, temperature 99.3 F (37.4 C), temperature source Oral, resp. rate 18, height 5\' 7"  (1.702 m), weight 51.9 kg, SpO2 94 %.   In an effort to minimize exposure given the current COVID-19 pandemic, I called into the patient's room to obtain a history and did not physically examine  the patient. On the phone, patient did not sound like he was in any acute distress. He was able to talk in complete sentences without any labored breathing.  MD to examine.  Labs    Troponin (Point of Care Test) No results for input(s): TROPIPOC in the last 72 hours. Recent Labs    08/07/18 1449 08/07/18 2047 08/08/18 0224  TROPONINI 0.03* 0.03* 0.03*   Lab Results  Component Value Date   WBC 8.9 08/05/2018   HGB 10.3 (L) 08/07/2018   HCT 31.4 (L) 08/07/2018   MCV 111.8 (H) 08/05/2018   PLT 101 (L) 08/05/2018    Recent Labs  Lab 08/08/18 0224  NA 138  K 3.9  CL 104  CO2 25  BUN 16  CREATININE 0.74  CALCIUM 8.2*  GLUCOSE 112*   No results found for: CHOL, HDL, LDLCALC, TRIG Lab Results  Component Value Date   DDIMER 1.15 (H) 07/28/2018     Radiology Studies    Dg Chest 1 View  Result Date: 07/30/2018 CLINICAL DATA:  58 year old male with COVID-19 status pending. Intubated. EXAM: CHEST  1 VIEW COMPARISON:  0413 hours today and earlier. FINDINGS: Portable AP semi upright view at 0904 hours. Endotracheal tube tip in good position between the level the clavicles and carina. The patient is mildly rotated to the right. Continued confluent right mid and lower lung consolidation. No pneumothorax or pulmonary edema. The left lung remains clear. Stable cardiac size and mediastinal contours. IMPRESSION: 1. Endotracheal tube tip in good position. 2. Continued confluent right mid and lower lung consolidation sen earlier today. Electronically Signed   By: Genevie Ann M.D.   On: 07/30/2018 09:42   Ct Angio Chest Pe W And/or Wo Contrast  Result Date: 07/28/2018 CLINICAL DATA:  Shortness of breath, evaluate for PE EXAM: CT ANGIOGRAPHY CHEST WITH CONTRAST TECHNIQUE: Multidetector CT imaging of the chest was performed using the standard protocol during bolus administration of intravenous contrast. Multiplanar CT image reconstructions and MIPs were obtained to evaluate the vascular anatomy.  CONTRAST:  133mL OMNIPAQUE IOHEXOL 350 MG/ML SOLN COMPARISON:  12/24/2014 FINDINGS: Cardiovascular: Satisfactory opacification of the pulmonary arteries to the segmental level. No evidence of pulmonary embolism. Scattered coronary artery calcifications. Normal heart size. No pericardial effusion. Mediastinum/Nodes: No enlarged mediastinal, hilar, or axillary lymph nodes. Thyroid gland,  trachea, and esophagus demonstrate no significant findings. Lungs/Pleura: Moderate centrilobular emphysema. There is clustered centrilobular and ground-glass opacity of the right upper lobe (series 7, image 76) and of the superior segment left lower lobe (series 7, image 82). Diffuse bilateral bronchial wall thickening. No pleural effusion or pneumothorax. Upper Abdomen: No acute abnormality. Musculoskeletal: No chest wall abnormality. No acute or significant osseous findings. Review of the MIP images confirms the above findings. IMPRESSION: 1.  Negative examination for pulmonary embolism. 2. There is clustered centrilobular and ground-glass opacity of the right upper lobe (series 7, image 76) and of the superior segment left lower lobe (series 7, image 82), consistent with multifocal infection. 3. Diffuse bilateral nonspecific infectious or inflammatory bronchial wall thickening. 4.  Emphysema. 5.  Coronary artery disease. Electronically Signed   By: Eddie Candle M.D.   On: 07/28/2018 21:58   Dg Chest Port 1 View  Result Date: 08/03/2018 CLINICAL DATA:  Respiratory failure EXAM: PORTABLE CHEST 1 VIEW COMPARISON:  Aug 02, 2018 FINDINGS: The ETT terminates in the mid trachea. The NG tube terminates below today's film. The cardiomediastinal silhouette is stable. Opacity in the right lung is stable, likely a combination of layering effusion and underlying infiltrate. More mild opacity in left base is stable as well. No other interval changes. IMPRESSION: 1. Stable support apparatus as above. 2. Bilateral pulmonary opacities, right  greater than left, persist, unchanged. The opacity on the right is likely a combination of pleural fluid layering with underlying parenchymal opacity which could represent atelectasis or pneumonia. Electronically Signed   By: Dorise Bullion III M.D   On: 08/03/2018 08:10   Dg Chest Port 1 View  Result Date: 08/02/2018 CLINICAL DATA:  Respiratory failure EXAM: PORTABLE CHEST 1 VIEW COMPARISON:  08/01/2018 FINDINGS: Cardiac shadow is stable. Endotracheal tube and gastric catheter are seen in satisfactory position. Increasing opacity in the right lung is noted likely related to a combination of infiltrate and posterior effusion. New patchy density in the left base is noted. No bony abnormality is seen. IMPRESSION: Bilateral airspace opacities increasing when compared with the prior exam right greater than left. Tubes and lines as described. Electronically Signed   By: Inez Catalina M.D.   On: 08/02/2018 07:59   Dg Chest Port 1 View  Result Date: 08/01/2018 CLINICAL DATA:  Respiratory failure EXAM: PORTABLE CHEST 1 VIEW COMPARISON:  07/31/2018 FINDINGS: Cardiac shadow is stable. Endotracheal tube and gastric catheter are again seen and stable. Increasing confluent density in the right mid and lower lung is again noted. Small right-sided pleural effusion is seen. Mild interstitial edema is noted as well. No pneumothorax is noted. Left lung remains clear. IMPRESSION: Increasing opacity in the right lung. Electronically Signed   By: Inez Catalina M.D.   On: 08/01/2018 07:56   Dg Chest Port 1 View  Result Date: 07/31/2018 CLINICAL DATA:  58 year old male with respiratory failure. Negative for COVID-19. EXAM: PORTABLE CHEST 1 VIEW COMPARISON:  07/30/2018 and earlier. FINDINGS: Portable AP semi upright view at 0529 hours. Stable endotracheal tube. Enteric tube has been placed and courses to the abdomen, tip not included. Regressed but not resolved confluent right mid and lower lung opacity since 0403 hours  yesterday. Stable lung volumes. Stable cardiac size and mediastinal contours. No pneumothorax. No pleural effusion is evident. The left lung remains clear. IMPRESSION: 1. Stable ETT.  Enteric tube placed. 2. Regressed but not resolved confluent right mid and lower lung opacity since yesterday. 3. No new cardiopulmonary abnormality. Electronically  Signed   By: Genevie Ann M.D.   On: 07/31/2018 07:44   Dg Chest Port 1 View  Result Date: 07/30/2018 CLINICAL DATA:  58 year old male with hypoxia. EXAM: PORTABLE CHEST 1 VIEW COMPARISON:  CTA chest 07/28/2018 and earlier. FINDINGS: Portable AP upright view at 0403 hours new large area of confluent opacity and consolidation in the right mid and lower lung since 07/28/2018. No superimposed pneumothorax. No pleural effusion is evident. The left lung remain stable. Stable cardiac size and mediastinal contours. Visualized tracheal air column is within normal limits. Stable visualized osseous structures. Negative visible bowel gas pattern. IMPRESSION: Large right lung multilobar pneumonia and/or aspiration is new since 07/28/2018. These results will be called to the ordering clinician or representative by the Radiologist Assistant, and communication documented in the PACS or zVision Dashboard. Electronically Signed   By: Genevie Ann M.D.   On: 07/30/2018 04:48   Dg Chest Portable 1 View  Result Date: 07/28/2018 CLINICAL DATA:  Shortness of breath and cough EXAM: PORTABLE CHEST 1 VIEW COMPARISON:  06/01/2018 FINDINGS: Cardiac shadow is stable. Aortic calcifications are again seen. The lungs are well aerated bilaterally. No acute infiltrate is seen. Old rib fractures are noted on the left. IMPRESSION: No active disease. Electronically Signed   By: Inez Catalina M.D.   On: 07/28/2018 20:34   Dg Abd Portable 1v  Result Date: 07/30/2018 CLINICAL DATA:  Orogastric tube placement EXAM: PORTABLE ABDOMEN - 1 VIEW COMPARISON:  CT abdomen and pelvis December 24, 2014 FINDINGS: Orogastric  tube tip and side port are in the stomach. There is no bowel dilatation or air-fluid level to suggest bowel obstruction. No free air. Liver appears enlarged. There is a right pleural effusion with ill-defined airspace opacity in the visualized right lung regions. There old healed rib fractures on the left. IMPRESSION: Orogastric tube tip and side port in stomach. No bowel obstruction or free air evident. Liver appears enlarged. Right pleural effusion with airspace opacity concerning for a degree of pneumonia in right lower lung region. Electronically Signed   By: Lowella Grip III M.D.   On: 07/30/2018 15:02    EKG     EKG: EKG was personally reviewed and demonstrates: NSR, anterolateral TWIs  Cardiac Imaging    Echocardiogram 08/04/2018: Impressions: 1. The right ventricle has severely reduced systolic function. The cavity was moderately enlarged. There is no increase in right ventricular wall thickness.  2. Left atrial size was mildly dilated.  3. Right atrial size was mildly dilated.  4. The mitral valve is degenerative. Mild thickening of the mitral valve leaflet. Mild calcification of the mitral valve leaflet. There is mild to moderate mitral annular calcification present. Mitral valve regurgitation is mild to moderate by color  flow Doppler.  5. The aortic valve is tricuspid. Mild thickening of the aortic valve. No stenosis of the aortic valve.  6. Tricuspid valve regurgitation is moderate.  7. Trivial pericardial effusion is present.  8. When compared to the prior study: No prior echocardiogram available for comparison.  Summary: Left ventricle is moderately dilated with severe systolic dysfunction, estimated LVEF 15-20% with diffuse hypokinesis, grade 2 diastolic dysfunction and elevated filling pressures. RVEF is severely reduced.  Assessment & Plan    Newly Diagnosed Systolic CHF with Biventricular Dysfunction - Patient was admitted with acute respiratory failure secondary to  CAP and COPD exacerbation requiring intubation. Patient was intubated on 07/30/2018 and extubated on 08/03/2018. - BNP elevated at 412.6 on admission. - Echo this admission showed  LVEF of 15-20% with diffuse hypokinesis, grade 2 diastolic dysfunction, and elevated filling pressures as well as a severely reduced RVEF.  - Patient was previously on IV Lasix 20mg  twice daily but this was transitioned to PO Lasix 20mg  daily today. Net positive 12 L this admission. - Will defer additional diuresis to MD who will assess volume status.  - Will ultimately need to start evidence based heart failure medications with ACEi/ARB/ARN, beta-blocker, and possibly spironolactone. However, patient is homeless and currently drinks 1/5 Vodka daily so compliance may be an issue. - Possibly secondary to alcohol abuse but patient does report some chest and need to rule out ischemic etiology. Anticipate patient will need right/left heart catheterization for further evaluation. - Continue to monitor daily weight, strict I/O's, and renal function.  Chest Pain - Patient reports atypical chest pain for the past couple of months that he describes as a stabbing feeling. Occurs at rest and with exertion but is not worse with exertion. - Unable to review EKG remotely. MD to review. - Troponin borderline positive and flat at <0.03, <0.03, <0.03, 0.03, 0.03, 0.03, 0.03. Not consistent with ACS. Suspect demand ischemia secondary to acute respiratory failure and severely reduced LVEF. - Anticipate right/left heart catheterization as stated above.  Acute Respiratory Failure/ CAP/ COPD - Intubated on 07/30/2018 and extubated on 08/03/2018. - Management per primary team.  Tobacco Abuse - Patient has a 40 pack year smoking history and smoked 1 pack per day before being admitted. - Complete cessation is advised.  Alcohol Abuse - Patient drinks 1/5 of Vodka daily. He had alcohol withdrawal earlier this admission. - Discussed the  importance of cessation with severely reduced LVEF. - Continue CIWA protocol.  Homelessness - [place]. - Education officer, museum already involved.   Otherwise, per primary team.  Signed, Darreld Asyah Candler, PA-C 08/08/2018, 8:51 AM Pager: [phone] For questions or updates, please contact   Please consult [email or web address] for contact info under Cardiology/STEMI.  Patient seen with PA, agree with the above note.   History of hospitalization is well-documented above.  He was admitted with PNA and now extubated and doing well.  Had ETOH withdrawal as well.  Echo done, showing EF 15-20% with severely decreased RV systolic function. He is a smoker, heavy drinker, and homeless.  He does describe occasional sharp, exertional chest pain prior to admission.   General: NAD Neck: No JVD, no thyromegaly or thyroid nodule.  Lungs: Rhonchi bilaterally.  CV: Lateral PMI.  Heart regular S1/S2, no S3/S4, no murmur.  No peripheral edema.  No carotid bruit.  Normal pedal pulses.  Abdomen: Soft, nontender, no hepatosplenomegaly, no distention.  Skin: Intact without lesions or rashes.  Neurologic: Alert and oriented x 3.  Psych: Normal affect. Extremities: No clubbing or cyanosis.  HEENT: Normal.   1. Acute on chronic systolic CHF: Newly noted cardiomyopathy, echo with EF 15-20% with moderate LV dilation, severely decreased RV systolic function.  He is not volume overloaded on exam.  It is possible that his cardiomyopathy is due to heavy ETOH use.  He does have a history of exertional chest discomfort, so will need to have CAD ruled out.  On exam, he does not look volume overloaded currently.  - Continue Lasix 20 mg po daily . - Start Entresto 24/26 bid.  - LHC/RHC tomorrow to assess cardiac output/filling pressures and to look for CAD.  Discussed risks/benefits with patient and he agrees to procedure.  - Needs total abstinence from  ETOH.  2. Community-acquired PNA: He has completed  antibiotic course.  3. COPD: needs to quit smoking, discussed today.  4. ETOH abuse: had withdrawal this admission.  Discussed cessation.  5. Homelessness: Social work assistance.   Loralie Champagne 08/08/2018 12:07 PM

## 2018-08-08 NOTE — Progress Notes (Addendum)
Occupational Therapy Treatment Patient Details Name: Trevor Williams MRN: [id number] DOB: 03/03/1961 Today's Date: 08/08/2018    History of present illness Patient is a 58 y.o. male with PMH: ETOH abuse, active tobacco user, COPD, empysema, skin cancer, who was admitted for dyspnea, wheezing and hypoxia. He developed alcohol withdrawl, respiratory failure and right lower lung infiltrate and was intubated on 5/12 and extubated on 5/16 in AM.    OT comments  Pt performing ADL functional transfers and ADL functional mobility with minA overall for safety and impulsivity of pt to do something before OTR is ready. Pt standing at sink for grooming, but as pt neared the sink, he dipped his head under the water for hair washing with minguardA for stability. Pt performing grooming at sink with minguardA. Pt tolerating ambulating in room with poor balance to fair minus with no AD. Pt would benefit from continued OT skilled services for ADL, mobility and safety in CIR setting. Pt progressing well with therapy, but continues to have poor insight into activity tolerance and safety. OT follow acutely for safety with ADL.    Follow Up Recommendations  SNF;Supervision - Intermittent(progessing with therapy, but poor safety awareness)    Equipment Recommendations  Other (comment)(to be determined)    Recommendations for Other Services      Precautions / Restrictions Precautions Precautions: Fall Precaution Comments: High fall risk, x1 fall since admission Restrictions Weight Bearing Restrictions: No       Mobility Bed Mobility Overal bed mobility: Needs Assistance Bed Mobility: Supine to Sit     Supine to sit: Supervision     General bed mobility comments: supervisionA for mobility  Transfers Overall transfer level: Needs assistance Equipment used: Rolling walker (2 wheeled) Transfers: Sit to/from Stand Sit to Stand: Min assist;Min guard              Balance Overall balance  assessment: Needs assistance   Sitting balance-Leahy Scale: Fair       Standing balance-Leahy Scale: Poor Standing balance comment: Increasing to fair minus with mobility today                            ADL either performed or assessed with clinical judgement   ADL Overall ADL's : Needs assistance/impaired     Grooming: Minimal assistance;Standing Grooming Details (indicate cue type and reason): Pt placing head under faucet for shampooing task. Pt impuslive and not listening to OTR for instructions for safety. Chair placed behind pt for standing instability and gait belt worn. Upper Body Bathing: Min guard;Standing                           Functional mobility during ADLs: Moderate assistance General ADL Comments: Pt performing ADL task at sink in standing with impulsiveness requireing cues for sequencing and safety. pt with increased weakness, decreased standing balance, decreased endurance with activity requiring seated rest     Vision Baseline Vision/History: No visual deficits Vision Assessment?: No apparent visual deficits   Perception     Praxis      Cognition Arousal/Alertness: Awake/alert Behavior During Therapy: WFL for tasks assessed/performed Overall Cognitive Status: No family/caregiver present to determine baseline cognitive functioning                                 General Comments: Pt impulsive even after going over use of  call bell in chair as charm alarm sounding, pt got out of chair into bed with alarm sounding with no use of call bell.         Exercises     Shoulder Instructions       General Comments O2 >90% on 2L [place]    Pertinent Vitals/ Pain       Pain Assessment: Faces Faces Pain Scale: Hurts a little bit Pain Location: low back Pain Descriptors / Indicators: Headache Pain Intervention(s): Monitored during session  Home Living Family/patient expects to be discharged to:: Shelter/Homeless Living  Arrangements: Other (Comment)(reports living in tent, has not family and friends are dead)                                      Prior Functioning/Environment Level of Independence: Independent            Frequency  Min 2X/week        Progress Toward Goals  OT Goals(current goals can now be found in the care plan section)  Progress towards OT goals: Progressing toward goals  Acute Rehab OT Goals Patient Stated Goal: agreeable to further rehab/therapies after discharge OT Goal Formulation: With patient Time For Goal Achievement: 08/19/18 Potential to Achieve Goals: Good ADL Goals Pt Will Perform Grooming: with modified independence;standing Pt Will Perform Lower Body Bathing: with modified independence;sit to/from stand Pt Will Perform Lower Body Dressing: with modified independence;sit to/from stand Pt Will Transfer to Toilet: with modified independence;ambulating Pt Will Perform Toileting - Clothing Manipulation and hygiene: with modified independence;sit to/from stand Pt/caregiver will Perform Home Exercise Program: Increased strength;Both right and left upper extremity;Independently Additional ADL Goal #1: Pt will tolerate standing ADL activity >5 min at mod independent level prior to requiring seated rest break.  Plan Discharge plan remains appropriate    Co-evaluation                 AM-PAC OT "6 Clicks" Daily Activity     Outcome Measure   Help from another person eating meals?: None Help from another person taking care of personal grooming?: None Help from another person toileting, which includes using toliet, bedpan, or urinal?: A Lot Help from another person bathing (including washing, rinsing, drying)?: A Lot Help from another person to put on and taking off regular upper body clothing?: A Little Help from another person to put on and taking off regular lower body clothing?: A Lot 6 Click Score: 17    End of Session Equipment  Utilized During Treatment: Gait belt;Oxygen  OT Visit Diagnosis: Muscle weakness (generalized) (M62.81);Unsteadiness on feet (R26.81)   Activity Tolerance Patient tolerated treatment well   Patient Left in bed;with call bell/phone within reach;with bed alarm set   Nurse Communication Mobility status        Time: 3710-6269 OT Time Calculation (min): 26 min  Charges: OT General Charges $OT Visit: 1 Visit OT Treatments $Self Care/Home Management : 23-37 mins  Ebony Hail Harold Hedge) Marsa Aris OTR/L Acute Rehabilitation Services Pager: [phone] Office: [place] 08/08/2018, 1:01 PM

## 2018-08-09 ENCOUNTER — Encounter (HOSPITAL_COMMUNITY)
Admission: EM | Disposition: A | Discharge status: Home / Self Care | Payer: Self-pay | Source: Home / Self Care | Attending: Pulmonary Disease

## 2018-08-09 ENCOUNTER — Encounter (HOSPITAL_COMMUNITY): Payer: Self-pay | Admitting: Cardiology

## 2018-08-09 DIAGNOSIS — I5021 Acute systolic (congestive) heart failure: Secondary | ICD-10-CM

## 2018-08-09 DIAGNOSIS — I429 Cardiomyopathy, unspecified: Secondary | ICD-10-CM

## 2018-08-09 DIAGNOSIS — I2510 Atherosclerotic heart disease of native coronary artery without angina pectoris: Secondary | ICD-10-CM

## 2018-08-09 HISTORY — PX: RIGHT/LEFT HEART CATH AND CORONARY ANGIOGRAPHY: CATH118266

## 2018-08-09 LAB — CBC
HCT: 34 % — ABNORMAL LOW (ref 39.0–52.0)
HCT: 32.5 % — ABNORMAL LOW (ref 39.0–52.0)
Hemoglobin: 11.5 g/dL — ABNORMAL LOW (ref 13.0–17.0)
Hemoglobin: 11 g/dL — ABNORMAL LOW (ref 13.0–17.0)
MCH: 36.7 pg — ABNORMAL HIGH (ref 26.0–34.0)
MCH: 36.9 pg — ABNORMAL HIGH (ref 26.0–34.0)
MCHC: 33.8 g/dL (ref 30.0–36.0)
MCHC: 33.8 g/dL (ref 30.0–36.0)
MCV: 108.6 fL — ABNORMAL HIGH (ref 80.0–100.0)
MCV: 109.1 fL — ABNORMAL HIGH (ref 80.0–100.0)
Platelets: 160 10*3/uL (ref 150–400)
Platelets: 168 10*3/uL (ref 150–400)
RBC: 3.13 MIL/uL — ABNORMAL LOW (ref 4.22–5.81)
RBC: 2.98 MIL/uL — ABNORMAL LOW (ref 4.22–5.81)
RDW: 12.1 % (ref 11.5–15.5)
RDW: 12.1 % (ref 11.5–15.5)
WBC: 9.5 10*3/uL (ref 4.0–10.5)
WBC: 11 10*3/uL — ABNORMAL HIGH (ref 4.0–10.5)
nRBC: 0 % (ref 0.0–0.2)
nRBC: 0 % (ref 0.0–0.2)

## 2018-08-09 LAB — POCT I-STAT EG7
Acid-base deficit: 1 mmol/L (ref 0.0–2.0)
Acid-base deficit: 1 mmol/L (ref 0.0–2.0)
Bicarbonate: 23 mmol/L (ref 20.0–28.0)
Bicarbonate: 23.5 mmol/L (ref 20.0–28.0)
Calcium, Ion: 1.2 mmol/L (ref 1.15–1.40)
Calcium, Ion: 1.23 mmol/L (ref 1.15–1.40)
HCT: 31 % — ABNORMAL LOW (ref 39.0–52.0)
HCT: 32 % — ABNORMAL LOW (ref 39.0–52.0)
Hemoglobin: 10.5 g/dL — ABNORMAL LOW (ref 13.0–17.0)
Hemoglobin: 10.9 g/dL — ABNORMAL LOW (ref 13.0–17.0)
O2 Saturation: 65 %
O2 Saturation: 63 %
Potassium: 4.3 mmol/L (ref 3.5–5.1)
Potassium: 4.4 mmol/L (ref 3.5–5.1)
Sodium: 137 mmol/L (ref 135–145)
Sodium: 137 mmol/L (ref 135–145)
TCO2: 24 mmol/L (ref 22–32)
TCO2: 25 mmol/L (ref 22–32)
pCO2, Ven: 36.7 mmHg — ABNORMAL LOW (ref 44.0–60.0)
pCO2, Ven: 37.3 mmHg — ABNORMAL LOW (ref 44.0–60.0)
pH, Ven: 7.404 (ref 7.250–7.430)
pH, Ven: 7.406 (ref 7.250–7.430)
pO2, Ven: 33 mmHg (ref 32.0–45.0)
pO2, Ven: 32 mmHg (ref 32.0–45.0)

## 2018-08-09 LAB — CREATININE, SERUM
Creatinine, Ser: 0.85 mg/dL (ref 0.61–1.24)
GFR calc Af Amer: >60 mL/min (ref >60)
GFR calc non Af Amer: >60 mL/min (ref >60)

## 2018-08-09 LAB — COMPREHENSIVE METABOLIC PANEL
ALT: 129 U/L — ABNORMAL HIGH (ref 0–44)
AST: 78 U/L — ABNORMAL HIGH (ref 15–41)
Albumin: 2.8 g/dL — ABNORMAL LOW (ref 3.5–5.0)
Alkaline Phosphatase: 76 U/L (ref 38–126)
Anion gap: 9 (ref 5–15)
BUN: 12 mg/dL (ref 6–20)
CO2: 23 mmol/L (ref 22–32)
Calcium: 8.4 mg/dL — ABNORMAL LOW (ref 8.9–10.3)
Chloride: 106 mmol/L (ref 98–111)
Creatinine, Ser: 0.77 mg/dL (ref 0.61–1.24)
GFR calc Af Amer: >60 mL/min (ref >60)
GFR calc non Af Amer: >60 mL/min (ref >60)
Glucose, Bld: 97 mg/dL (ref 70–99)
Potassium: 4.5 mmol/L (ref 3.5–5.1)
Sodium: 138 mmol/L (ref 135–145)
Total Bilirubin: 1.4 mg/dL — ABNORMAL HIGH (ref 0.3–1.2)
Total Protein: 5.8 g/dL — ABNORMAL LOW (ref 6.5–8.1)

## 2018-08-09 LAB — GLUCOSE, CAPILLARY
Glucose-Capillary: 79 mg/dL (ref 70–99)
Glucose-Capillary: 119 mg/dL — ABNORMAL HIGH (ref 70–99)
Glucose-Capillary: 90 mg/dL (ref 70–99)
Glucose-Capillary: 101 mg/dL — ABNORMAL HIGH (ref 70–99)

## 2018-08-09 LAB — MAGNESIUM: Magnesium: 1.9 mg/dL (ref 1.7–2.4)

## 2018-08-09 SURGERY — RIGHT/LEFT HEART CATH AND CORONARY ANGIOGRAPHY
Anesthesia: LOCAL

## 2018-08-09 MED ORDER — FENTANYL CITRATE (PF) 100 MCG/2ML IJ SOLN
INTRAMUSCULAR | Status: DC | PRN
  Administered 2018-08-09 (×2): 25 ug via INTRAVENOUS

## 2018-08-09 MED ORDER — LIDOCAINE HCL (PF) 1 % IJ SOLN
INTRAMUSCULAR | Status: AC
  Filled 2018-08-09: qty 30

## 2018-08-09 MED ORDER — MIDAZOLAM HCL 2 MG/2ML IJ SOLN
INTRAMUSCULAR | Status: DC | PRN
  Administered 2018-08-09: 1 mg via INTRAVENOUS

## 2018-08-09 MED ORDER — HEPARIN SODIUM (PORCINE) 1000 UNIT/ML IJ SOLN
INTRAMUSCULAR | Status: DC | PRN
  Administered 2018-08-09: 3000 [IU] via INTRAVENOUS

## 2018-08-09 MED ORDER — HEPARIN (PORCINE) IN NACL 1000-0.9 UT/500ML-% IV SOLN
INTRAVENOUS | Status: AC
  Filled 2018-08-09: qty 1500

## 2018-08-09 MED ORDER — HEPARIN (PORCINE) IN NACL 1000-0.9 UT/500ML-% IV SOLN
INTRAVENOUS | Status: DC | PRN
  Administered 2018-08-09 (×2): 500 mL

## 2018-08-09 MED ORDER — SODIUM CHLORIDE 0.9% FLUSH
3.0000 mL | Freq: Two times a day (BID) | INTRAVENOUS | Status: DC
  Administered 2018-08-09 – 2018-08-10 (×3): 3 mL via INTRAVENOUS

## 2018-08-09 MED ORDER — MIDAZOLAM HCL 2 MG/2ML IJ SOLN
INTRAMUSCULAR | Status: AC
  Filled 2018-08-09: qty 2

## 2018-08-09 MED ORDER — ASPIRIN 81 MG PO CHEW
81.0000 mg | CHEWABLE_TABLET | Freq: Every day | ORAL | Status: DC
  Administered 2018-08-09 – 2018-08-10 (×2): 81 mg via ORAL
  Filled 2018-08-09 (×2): qty 1

## 2018-08-09 MED ORDER — SODIUM CHLORIDE 0.9 % IV SOLN: 250.0000 mL | INTRAVENOUS | Status: DC | PRN

## 2018-08-09 MED ORDER — VERAPAMIL HCL 2.5 MG/ML IV SOLN
INTRAVENOUS | Status: AC
  Filled 2018-08-09: qty 2

## 2018-08-09 MED ORDER — CARVEDILOL 3.125 MG PO TABS
3.1250 mg | ORAL_TABLET | Freq: Two times a day (BID) | ORAL | Status: DC
  Administered 2018-08-09: 3.125 mg via ORAL
  Filled 2018-08-09: qty 1

## 2018-08-09 MED ORDER — ROSUVASTATIN CALCIUM 5 MG PO TABS
10.0000 mg | ORAL_TABLET | Freq: Every day | ORAL | Status: DC
  Administered 2018-08-09 – 2018-08-11 (×3): 10 mg via ORAL
  Filled 2018-08-09 (×3): qty 2

## 2018-08-09 MED ORDER — LABETALOL HCL 5 MG/ML IV SOLN: 10.0000 mg | INTRAVENOUS | Status: DC | PRN

## 2018-08-09 MED ORDER — SODIUM CHLORIDE 0.9 % WEIGHT BASED INFUSION
1.0000 mL/kg/h | INTRAVENOUS | Status: DC
  Administered 2018-08-09: 1 mL/kg/h via INTRAVENOUS

## 2018-08-09 MED ORDER — MAGNESIUM SULFATE 2 GM/50ML IV SOLN
2.0000 g | Freq: Once | INTRAVENOUS | Status: AC
  Administered 2018-08-09: 2 g via INTRAVENOUS
  Filled 2018-08-09: qty 50

## 2018-08-09 MED ORDER — HEPARIN SODIUM (PORCINE) 1000 UNIT/ML IJ SOLN
INTRAMUSCULAR | Status: AC
  Filled 2018-08-09: qty 1

## 2018-08-09 MED ORDER — ACETAMINOPHEN 325 MG PO TABS: 650.0000 mg | ORAL_TABLET | ORAL | Status: DC | PRN

## 2018-08-09 MED ORDER — FENTANYL CITRATE (PF) 100 MCG/2ML IJ SOLN
INTRAMUSCULAR | Status: AC
  Filled 2018-08-09: qty 2

## 2018-08-09 MED ORDER — ONDANSETRON HCL 4 MG/2ML IJ SOLN: 4.0000 mg | Freq: Four times a day (QID) | INTRAMUSCULAR | Status: DC | PRN

## 2018-08-09 MED ORDER — IOPAMIDOL (ISOVUE-370) INJECTION 76%
INTRAVENOUS | Status: DC | PRN
  Administered 2018-08-09: 10:00:00 50 mL via INTRAVENOUS

## 2018-08-09 MED ORDER — HEPARIN SODIUM (PORCINE) 5000 UNIT/ML IJ SOLN
5000.0000 [IU] | Freq: Three times a day (TID) | INTRAMUSCULAR | Status: DC
  Administered 2018-08-10 – 2018-08-11 (×4): 5000 [IU] via SUBCUTANEOUS
  Filled 2018-08-09 (×4): qty 1

## 2018-08-09 MED ORDER — SODIUM CHLORIDE 0.9% FLUSH: 3.0000 mL | INTRAVENOUS | Status: DC | PRN

## 2018-08-09 MED ORDER — HYDRALAZINE HCL 20 MG/ML IJ SOLN: 10.0000 mg | INTRAMUSCULAR | Status: DC | PRN

## 2018-08-09 MED ORDER — VERAPAMIL HCL 2.5 MG/ML IV SOLN
INTRAVENOUS | Status: DC | PRN
  Administered 2018-08-09: 10:00:00 via INTRA_ARTERIAL

## 2018-08-09 MED ORDER — LIDOCAINE HCL (PF) 1 % IJ SOLN
INTRAMUSCULAR | Status: DC | PRN
  Administered 2018-08-09 (×2): 2 mL via SUBCUTANEOUS

## 2018-08-09 MED ORDER — HEPARIN (PORCINE) IN NACL 1000-0.9 UT/500ML-% IV SOLN
INTRAVENOUS | Status: DC | PRN
  Administered 2018-08-09: 500 mL

## 2018-08-09 SURGICAL SUPPLY — 12 items

## 2018-08-09 NOTE — Progress Notes (Signed)
Nutrition Follow-up  INTERVENTION:   -Continue MVI with minerals daily -Continue Ensure Enlive po TID, each supplement provides 350 kcal and 20 grams of protein  NUTRITION DIAGNOSIS:   Increased nutrient needs related to chronic illness(COPD, emphysema) as evidenced by estimated needs.  Ongoing.  GOAL:   Patient will meet greater than or equal to 90% of their needs  Progressing.  MONITOR:   PO intake, Supplement acceptance, Diet advancement, Labs, Weight trends, Skin, I & O's  ASSESSMENT:   58 yo male with PMH of COPD/emphysema, skin cancer, smoker, alcohol abuse who was admitted with COPD exacerbation. Developed alcohol withdrawal and respiratory failure requiring intubation and transfer to the ICU on 5/12.  5/16- extubated, s/p BSE- advanced to dysphagia 3 diet with thin liquids  **RD working remotely**  Patient is s/p RIGHT/LEFT HEART CATH AND CORONARY ANGIOGRAPHY this morning. Was NPO for procedure, now back on heart healthy diet. Last PO documented was 60% of breakfast on 5/18. Patient is drinking his Ensure supplements, at least 2 each day.  Per weight records, pt's weight is now trending down. Consistent with weight from 5/11. Per I/O's: +11.8L since admission.  Medications: Folic acid tablet daily, Lasix tablet daily, MAG-OX tablet BID, Multivitamin with minerals daily, Thiamine tablet daily Labs reviewed: CBGs: 79-119  Diet Order:   Diet Order            Diet Heart Room service appropriate? Yes; Fluid consistency: Thin  Diet effective now              EDUCATION NEEDS:   Education needs have been addressed  Skin:  Skin Assessment: Skin Integrity Issues: Skin Integrity Issues:: Other (Comment) Other: MASD to bilateral feet, rash to abdomen and bilateral thighs  Last BM:  5/21- loose  Height:   Ht Readings from Last 1 Encounters:  07/29/18 5\' 7"  (1.702 m)    Weight:   Wt Readings from Last 1 Encounters:  08/09/18 57.3 kg    Ideal Body  Weight:  67.3 kg  BMI:  Body mass index is 19.79 kg/m.  Estimated Nutritional Needs:   Kcal:  2000-2200  Protein:  100-115 grams  Fluid:  > 2.0 L  Clayton Bibles, MS, RD, LDN [facility] Pager: [phone] After Hours Pager: [phone]

## 2018-08-09 NOTE — Interval H&P Note (Signed)
History and Physical Interval Note:  08/09/2018 9:25 AM  Trevor Williams  has presented today for surgery, with the diagnosis of heart failure.  The various methods of treatment have been discussed with the patient and family. After consideration of risks, benefits and other options for treatment, the patient has consented to  Procedure(s): RIGHT/LEFT HEART CATH AND CORONARY ANGIOGRAPHY (N/A) as a surgical intervention.  The patient's history has been reviewed, patient examined, no change in status, stable for surgery.  I have reviewed the patient's chart and labs.  Questions were answered to the patient's satisfaction.     Darshay Deupree [employer]

## 2018-08-09 NOTE — Progress Notes (Signed)
PROGRESS NOTE    Trevor Williams  EGB:[id number] DOB: [birth year] DOA: 07/28/2018 PCP: Marliss Coots, NP    Brief Narrative; 58 year old with past medical history significant for tobacco use disorder, alcohol use disorder and a skin cancer presenting with progressive shortness of breath and dyspnea on exertion for 1 month.  Has had intermittent chest pain.  EMS: Patient was placed on a nonrebreather, given epinephrine, Solu-Medrol, magnesium and albuterol and brought to the ED.  In the ED patient was wheezing and hypoxic.  ABG: pH seven-point 3/46/47/23.  BNP elevated 412.  CRP less than 0.8.  CBC with microcytic anemia at baseline, thrombocytopenia.  Troponin mildly elevated.  EKG with sinus rhythm with possible lateral wall ischemia.  D-dimer was elevated.  CTA showed multifocal pneumonia and changes consistent with emphysema and possible coronary artery disease.  COVID-19 test was negative.  Patient was admitted with acute respiratory failure with hypoxemia secondary to pneumonia and possible COPD exacerbation. During hospitalization patient became more hypoxic, developed alcohol withdrawal and was transferred to ICU.  He was intubated from 5/12 and extubated on 5/16.  Patient was a started on Precedex for alcohol withdrawal.  Patient was treated with with broad-spectrum antibiotics for pneumonia. TRH resume care on 5/17.  Patient has been also be treated for heart failure exacerbation, he received IV Lasix 20 mg IV twice daily.  His weight has been coming down.  He was transitioned to oral Lasix.  Echocardiogram was performed and is consistent with systolic heart failure, ejection fraction 15 to 20%.  Cardiology has been consulted. for further recommendations.   Assessment & Plan:   Principal Problem:   Acute respiratory failure with hypoxia (HCC) Active Problems:   Community acquired pneumonia  1-acute hypoxic respiratory failure: Multifactorial secondary to pneumonia, COPD exacerbation  and possible CHF exacerbation. Initial ABG showed compensated respiratory acidosis and hypoxemia. He is still requiring oxygen. Continue with nebulizer. He has received IV Lasix 20 mg IV twice daily. His weight is down  2-Community-acquired pneumonia: Noted on CT a chest.  Patient completed course of antibiotics.  COVID-19 negative x2.  RVP negative.  Blood and sputum cultures negative.  3-COPD exacerbation, no formal diagnosis but he has a 40-pack-year history.  Current smoker.. -Continue with LAMA/LABA-Anoro Ellipta -finished solumedrol.  -PRN nebulizer.   4-Acute systolic HF exacerbation: Presented with worsening dyspnea on exertion for months.  BNP elevated.  Started on IV Lasix he had good response to diuretics.  He was transitioned to oral Lasix. -Echocardiogram with reduced ejection fraction 15 to 20%, diffuse wall motion abnormality. -Cardiology has been consulted, will defer to cardiology if starting Entresto or ARB.   5-EKG changes, new T wave changes compared to prior EKG.  He denies any recent chest pain.  Reports chest pain weeks ago.  Enzymes negative. EKG this morning with less pronounced T wave inversion.  Improve after correction of hypomagnesemia and hypokalemia.. Cardiology has been consulted due to history of chest pain and new diagnosis of cardiomyopathy with low ejection fraction. Underwent cardiac catheterization nonobstructive cardiomyopathy.  6-alcohol use disorder/alcohol withdrawal: He reports drinking about half a paint of liquor a day. ETT from 5/12 to 5/16. Continue with multivitamins, thiamine and folic acid. He was treated with Precedex in the ICU.  Thrombocytopenia: Likely related to alcohol use monitor.  Severe protein caloric malnutrition.  Likely due to acute on chronic illness, alcohol use disorder.  Appreciate nutrition's input  Delirium: Impulsive and disoriented intermittently overnight. His Klonopin was reduced to 0.5 twice daily.  Appears  more calm today.  hypoKalemia/hypomagnesemia: Replace. Will start oral magnesium supplement. We will start oral potassium supplement while he is on Lasix.  Nutrition Problem: Increased nutrient needs Etiology: chronic illness(COPD, emphysema)    Signs/Symptoms: estimated needs    Interventions: MVI, Ensure Enlive (each supplement provides 350kcal and 20 grams of protein)  Estimated body mass index is 19.79 kg/m as calculated from the following:   Height as of this encounter: 5\' 7"  (1.702 m).   Weight as of this encounter: 57.3 kg.   DVT prophylaxis: SCDs Code Status; full code Family Communication: Care discussed with patient Disposition Plan: Patient with new diagnosis of systolic heart failure ejection fraction 15 to 20%, prior history of chest pain.  Still hypoxic with poor safety awareness.  Need to go to SNF.  Consultants:  CCM   Procedures:   ETT 5/12-5/16   Antimicrobials:  COVID (in house) 5/10 >> negative RVP 5/10 >> negative Blood 5/10 >> negative COVID (send out) 5/11 >> negative Sputum 5/12 >> normal flora   Subjective: Reports some nausea.  No chest pain.  No shortness of breath.  Eating okay.  Objective: Vitals:   08/09/18 0956 08/09/18 1001 08/09/18 1006 08/09/18 1224  BP: 110/78 110/74 106/75 100/76  Pulse: 99 100 97 95  Resp: 15 17 19 18   Temp:    98.2 F (36.8 C)  TempSrc:    Oral  SpO2: 100% 100% 99% 100%  Weight:      Height:        Intake/Output Summary (Last 24 hours) at 08/09/2018 1918 Last data filed at 08/09/2018 1700 Gross per 24 hour  Intake 170.12 ml  Output 200 ml  Net -29.88 ml   Filed Weights   08/08/18 1432 08/09/18 0335 08/09/18 0551  Weight: 57.3 kg 59.8 kg 57.3 kg    Examination:  General exam: Not acute distress Respiratory system: Crackles at the bases Cardiovascular system: S1, S2 regular rhythm and rate Gastrointestinal system: Bowel sounds present, soft, nontender nondistended Central nervous system:  Nonfocal Extremities: Symmetric power Skin: No rashes    Data Reviewed: I have personally reviewed following labs and imaging studies  CBC: Recent Labs  Lab 08/03/18 0255 08/04/18 0450 08/05/18 0606 08/06/18 0443 08/07/18 0527 08/09/18 0628 08/09/18 0945 08/09/18 0946  WBC 6.5 14.3* 8.9  --   --  9.5  --   --   HGB 10.0* 9.9* 9.4* 9.2* 10.3* 11.5* 10.5* 10.9*  HCT 30.3* 29.5* 28.4* 27.7* 31.4* 34.0* 31.0* 32.0*  MCV 111.4* 110.9* 111.8*  --   --  108.6*  --   --   PLT 63* 90* 101*  --   --  160  --   --    Basic Metabolic Panel: Recent Labs  Lab 08/04/18 0450 08/05/18 0606 08/06/18 0443 08/07/18 0527 08/08/18 0224 08/09/18 0628 08/09/18 0945 08/09/18 0946  NA 138 141 143 139 138 138 137 137  K 3.8 3.5 3.9 3.1* 3.9 4.5 4.3 4.4  CL 107 106 104 100 104 106  --   --   CO2 22 25 26 29 25 23   --   --   GLUCOSE 123* 172* 137* 94 112* 97  --   --   BUN 17 20 25* 20 16 12   --   --   CREATININE 0.77 0.93 0.91 0.83 0.74 0.77  --   --   CALCIUM 8.6* 8.3* 8.5* 8.3* 8.2* 8.4*  --   --   MG 2.0  --  2.0 1.7 1.9 1.9  --   --   PHOS 4.3  --   --   --   --   --   --   --    GFR: Estimated Creatinine Clearance: 82.6 mL/min (by C-G formula based on SCr of 0.77 mg/dL). Liver Function Tests: Recent Labs  Lab 08/09/18 0628  AST 78*  ALT 129*  ALKPHOS 76  BILITOT 1.4*  PROT 5.8*  ALBUMIN 2.8*   No results for input(s): LIPASE, AMYLASE in the last 168 hours. No results for input(s): AMMONIA in the last 168 hours. Coagulation Profile: No results for input(s): INR, PROTIME in the last 168 hours. Cardiac Enzymes: Recent Labs  Lab 08/07/18 1449 08/07/18 2047 08/08/18 0224  TROPONINI 0.03* 0.03* 0.03*   BNP (last 3 results) No results for input(s): PROBNP in the last 8760 hours. HbA1C: No results for input(s): HGBA1C in the last 72 hours. CBG: Recent Labs  Lab 08/08/18 1600 08/08/18 2109 08/09/18 0810 08/09/18 1221 08/09/18 1627  GLUCAP 121* 102* 79 119* 90    Lipid Profile: No results for input(s): CHOL, HDL, LDLCALC, TRIG, CHOLHDL, LDLDIRECT in the last 72 hours. Thyroid Function Tests: No results for input(s): TSH, T4TOTAL, FREET4, T3FREE, THYROIDAB in the last 72 hours. Anemia Panel: No results for input(s): VITAMINB12, FOLATE, FERRITIN, TIBC, IRON, RETICCTPCT in the last 72 hours. Sepsis Labs: No results for input(s): PROCALCITON, LATICACIDVEN in the last 168 hours.  Recent Results (from the past 240 hour(s))  Novel Coronavirus, NAA (hospital order; send-out to ref lab)     Status: None   Collection Time: 08/05/18  5:35 PM  Result Value Ref Range Status   SARS-CoV-2, NAA NOT DETECTED NOT DETECTED Final    Comment: (NOTE) This test was developed and its performance characteristics determined by [employer]. This test has not been FDA cleared or approved. This test has been authorized by FDA under an Emergency Use Authorization (EUA). This test is only authorized for the duration of time the declaration that circumstances exist justifying the authorization of the emergency use of in vitro diagnostic tests for detection of SARS-CoV-2 virus and/or diagnosis of COVID-19 infection under section 564(b)(1) of the Act, 21 U.S.C. 354SFK-8(L)(2), unless the authorization is terminated or revoked sooner. When diagnostic testing is negative, the possibility of a false negative result should be considered in the context of a patient's recent exposures and the presence of clinical signs and symptoms consistent with COVID-19. An individual without symptoms of COVID-19 and who is not shedding SARS-CoV-2 virus would expect to have a negative (not detected) result in this assay. Performed  At: [facility] [address] [place], [place] [id number] Rush Farmer MD BS:[id number]    [place]  Final    Comment: Performed at [facility], [place] [address] [place], [place] [address]          Radiology Studies: No results found.      Scheduled Meds: . aspirin  81 mg Oral Daily  . carvedilol  3.125 mg Oral BID WC  . Chlorhexidine Gluconate Cloth  6 each Topical Q0600  . famotidine  20 mg Oral BID  . feeding supplement (ENSURE ENLIVE)  237 mL Oral TID BM  . folic acid  1 mg Oral Daily  . guaiFENesin  5 mL Oral BID  . magnesium oxide  200 mg Oral BID  . mouth rinse  15 mL Mouth Rinse BID  . multivitamin with minerals  1 tablet Oral Daily  .  nicotine  21 mg Transdermal Daily  . rosuvastatin  10 mg Oral q1800  . sacubitril-valsartan  1 tablet Oral BID  . thiamine  100 mg Oral Daily  . traZODone  50 mg Oral QHS  . umeclidinium-vilanterol  1 puff Inhalation Daily   Continuous Infusions: . sodium chloride 10 mL/hr at 08/01/18 2200     LOS: 11 days    Time spent: 35 minutes.     Berle Mull, MD [facility]  If 7PM-7AM, please contact night-coverage [email or web address] Password [facility] 08/09/2018, 7:18 PM

## 2018-08-09 NOTE — Progress Notes (Addendum)
Patient ID: Trevor Williams, male   DOB: 1960-06-29, 58 y.o.   MRN: [id number]     Advanced Heart Failure Rounding Note  PCP-Cardiologist: No primary care provider on file.   Subjective:    Patient feels good this morning, no complaints.   LHC/RHC: Coronary Findings   Diagnostic  Dominance: Right  Left Main  No significant disease.  Left Anterior Descending  Luminal irregularities.  Ramus Intermedius  Large branching vessel. There was 60% ostial stenosis in the moderate-sized first branch off the main ramus.  Left Circumflex  Luminal irregularities.  Right Coronary Artery  Luminal irregularities.  Intervention   No interventions have been documented.  Right Heart   Right Heart Pressures RHC Procedural Findings: Hemodynamics (mmHg) RA mean 1 RV 16/1 PA 15/7 PCWP mean 2 LV 91/1 AO 94/60  Oxygen saturations: PA 64% AO 100%  Cardiac Output (Fick) 4.04  Cardiac Index (Fick) 2.43      Objective:   Weight Range: 57.3 kg Body mass index is 19.79 kg/m.   Vital Signs:   Temp:  [98.2 F (36.8 C)-98.7 F (37.1 C)] 98.7 F (37.1 C) (05/22 0831) Pulse Rate:  [92-106] 97 (05/22 1006) Resp:  [0-41] 19 (05/22 1006) BP: (100-119)/(69-82) 106/75 (05/22 1006) SpO2:  [0 %-100 %] 99 % (05/22 1006) Weight:  [57.3 kg-59.8 kg] 57.3 kg (05/22 0551) Last BM Date: 08/08/18  Weight change: Filed Weights   08/08/18 1432 08/09/18 0335 08/09/18 0551  Weight: 57.3 kg 59.8 kg 57.3 kg    Intake/Output:   Intake/Output Summary (Last 24 hours) at 08/09/2018 1016 Last data filed at 08/09/2018 0831 Gross per 24 hour  Intake 170.12 ml  Output 950 ml  Net -779.88 ml      Physical Exam    General:  Thin. No resp difficulty HEENT: Normal Neck: Supple. JVP not elevated. Carotids 2+ bilat; no bruits. No lymphadenopathy or thyromegaly appreciated. Cor: PMI nondisplaced. Regular rate & rhythm. No rubs, gallops or murmurs. Lungs: Clear Abdomen: Soft, nontender, nondistended. No  hepatosplenomegaly. No bruits or masses. Good bowel sounds. Extremities: No cyanosis, clubbing, rash, edema Neuro: Alert & orientedx3, cranial nerves grossly intact. moves all 4 extremities w/o difficulty. Affect pleasant   Telemetry    NSR 90s-100s (personally reviewed)  Labs    CBC Recent Labs    08/07/18 0527 08/09/18 0628  WBC  --  9.5  HGB 10.3* 11.5*  HCT 31.4* 34.0*  MCV  --  108.6*  PLT  --  211   Basic Metabolic Panel Recent Labs    08/08/18 0224 08/09/18 0628  NA 138 138  K 3.9 4.5  CL 104 106  CO2 25 23  GLUCOSE 112* 97  BUN 16 12  CREATININE 0.74 0.77  CALCIUM 8.2* 8.4*  MG 1.9 1.9   Liver Function Tests Recent Labs    08/09/18 0628  AST 78*  ALT 129*  ALKPHOS 76  BILITOT 1.4*  PROT 5.8*  ALBUMIN 2.8*   No results for input(s): LIPASE, AMYLASE in the last 72 hours. Cardiac Enzymes Recent Labs    08/07/18 1449 08/07/18 2047 08/08/18 0224  TROPONINI 0.03* 0.03* 0.03*    BNP: BNP (last 3 results) Recent Labs    06/01/18 1800 07/28/18 2012  BNP 428.8* 412.6*    ProBNP (last 3 results) No results for input(s): PROBNP in the last 8760 hours.   D-Dimer No results for input(s): DDIMER in the last 72 hours. Hemoglobin A1C No results for input(s): HGBA1C in the last 72  hours. Fasting Lipid Panel No results for input(s): CHOL, HDL, LDLCALC, TRIG, CHOLHDL, LDLDIRECT in the last 72 hours. Thyroid Function Tests No results for input(s): TSH, T4TOTAL, T3FREE, THYROIDAB in the last 72 hours.  Invalid input(s): FREET3  Other results:   Imaging     No results found.   Medications:     Scheduled Medications: . aspirin  81 mg Oral Daily  . carvedilol  3.125 mg Oral BID WC  . [MAR Hold] Chlorhexidine Gluconate Cloth  6 each Topical Q0600  . [MAR Hold] famotidine  20 mg Oral BID  . [MAR Hold] feeding supplement (ENSURE ENLIVE)  237 mL Oral TID BM  . [MAR Hold] folic acid  1 mg Oral Daily  . [MAR Hold] guaiFENesin  5 mL Oral  BID  . [MAR Hold] magnesium oxide  200 mg Oral BID  . [MAR Hold] mouth rinse  15 mL Mouth Rinse BID  . [MAR Hold] multivitamin with minerals  1 tablet Oral Daily  . [MAR Hold] nicotine  21 mg Transdermal Daily  . rosuvastatin  10 mg Oral q1800  . [MAR Hold] sacubitril-valsartan  1 tablet Oral BID  . sodium chloride flush  3 mL Intravenous Q12H  . [MAR Hold] thiamine  100 mg Oral Daily  . [MAR Hold] traZODone  50 mg Oral QHS  . [MAR Hold] umeclidinium-vilanterol  1 puff Inhalation Daily     Infusions: . sodium chloride 10 mL/hr at 08/01/18 2200  . sodium chloride    . sodium chloride 10 mL/hr at 08/09/18 0559     PRN Medications:  sodium chloride, [MAR Hold] acetaminophen, [MAR Hold] bisacodyl, [MAR Hold] clonazePAM, [MAR Hold] docusate, [MAR Hold] ipratropium-albuterol, sodium chloride flush    Assessment/Plan   1. Acute systolic CHF: Newly noted cardiomyopathy, echo with EF 15-20% with moderate LV dilation, severely decreased RV systolic function.  Nonischemic cardiomyopathy based on coronary angiography.  He is not volume overloaded on exam or by RHC today. Cardiac output preserved on RHC.  It is possible that his cardiomyopathy is due to heavy ETOH use.   - Can stop Lasix and will gently hydrate post-cath. - Continue Entresto 24/26 bid.  - Start Coreg 3.125 mg bid. - Needs total abstinence from ETOH.  2. Community-acquired PNA: He has completed antibiotic course.  3. COPD: needs to quit smoking, discussed again today.  4. ETOH abuse: had withdrawal this admission.  Discussed cessation again today.  5. Homelessness: Social work assistance.  6. CAD: Nonobstructive CAD on cath (60% ostial stenosis in moderate branch off ramus).   - Would continue ASA 81 daily.  - Will check lipids, start Crestor 10 mg daily.   Length of Stay: 95  Loralie Champagne, MD  08/09/2018, 10:16 AM  Advanced Heart Failure Team Pager [phone] (M-F; [place])  Please contact [facility] for  night-coverage after hours (4p -7a ) and weekends on [email or web address]

## 2018-08-09 NOTE — Progress Notes (Signed)
Received phone call regarding Entresto at d/c since pt has no insurance. Per CSW pt has no bed offers and the plan is for SNF rehab under a LOG at d/c. No weekend d/c. Pt may need Entresto filled prior to d/c to SNF through [facility] since medication is expensive. CM will follow.

## 2018-08-09 NOTE — Plan of Care (Signed)

## 2018-08-10 LAB — CBC WITH DIFFERENTIAL/PLATELET
Abs Immature Granulocytes: 0.05 10*3/uL (ref 0.00–0.07)
Basophils Absolute: 0 10*3/uL (ref 0.0–0.1)
Basophils Relative: 0 %
Eosinophils Absolute: 0.4 10*3/uL (ref 0.0–0.5)
Eosinophils Relative: 4 %
HCT: 31.1 % — ABNORMAL LOW (ref 39.0–52.0)
Hemoglobin: 10.3 g/dL — ABNORMAL LOW (ref 13.0–17.0)
Immature Granulocytes: 1 %
Lymphocytes Relative: 11 %
Lymphs Abs: 1.1 10*3/uL (ref 0.7–4.0)
MCH: 36.1 pg — ABNORMAL HIGH (ref 26.0–34.0)
MCHC: 33.1 g/dL (ref 30.0–36.0)
MCV: 109.1 fL — ABNORMAL HIGH (ref 80.0–100.0)
Monocytes Absolute: 0.8 10*3/uL (ref 0.1–1.0)
Monocytes Relative: 8 %
Neutro Abs: 7.4 10*3/uL (ref 1.7–7.7)
Neutrophils Relative %: 76 %
Platelets: 176 10*3/uL (ref 150–400)
RBC: 2.85 MIL/uL — ABNORMAL LOW (ref 4.22–5.81)
RDW: 12.2 % (ref 11.5–15.5)
WBC: 9.7 10*3/uL (ref 4.0–10.5)
nRBC: 0 % (ref 0.0–0.2)

## 2018-08-10 LAB — GLUCOSE, CAPILLARY
Glucose-Capillary: 98 mg/dL (ref 70–99)
Glucose-Capillary: 93 mg/dL (ref 70–99)
Glucose-Capillary: 108 mg/dL — ABNORMAL HIGH (ref 70–99)

## 2018-08-10 LAB — LIPID PANEL
Cholesterol: 87 mg/dL (ref 0–200)
HDL: 26 mg/dL — ABNORMAL LOW (ref >40)
LDL Cholesterol: 45 mg/dL (ref 0–99)
Total CHOL/HDL Ratio: 3.3 RATIO
Triglycerides: 80 mg/dL (ref <150)
VLDL: 16 mg/dL (ref 0–40)

## 2018-08-10 LAB — BASIC METABOLIC PANEL
Anion gap: 10 (ref 5–15)
BUN: 13 mg/dL (ref 6–20)
CO2: 23 mmol/L (ref 22–32)
Calcium: 8.6 mg/dL — ABNORMAL LOW (ref 8.9–10.3)
Chloride: 104 mmol/L (ref 98–111)
Creatinine, Ser: 0.79 mg/dL (ref 0.61–1.24)
GFR calc Af Amer: >60 mL/min (ref >60)
GFR calc non Af Amer: >60 mL/min (ref >60)
Glucose, Bld: 113 mg/dL — ABNORMAL HIGH (ref 70–99)
Potassium: 4.5 mmol/L (ref 3.5–5.1)
Sodium: 137 mmol/L (ref 135–145)

## 2018-08-10 LAB — MAGNESIUM: Magnesium: 2 mg/dL (ref 1.7–2.4)

## 2018-08-10 MED ORDER — THIAMINE HCL 100 MG PO TABS: 100.0000 mg | ORAL_TABLET | Freq: Every day | ORAL | 0 refills | Status: DC

## 2018-08-10 MED ORDER — FAMOTIDINE 20 MG PO TABS: 20.0000 mg | ORAL_TABLET | Freq: Every day | ORAL | 0 refills | Status: DC

## 2018-08-10 MED ORDER — TRAZODONE HCL 50 MG PO TABS: 50.0000 mg | ORAL_TABLET | Freq: Every day | ORAL | 0 refills | Status: DC

## 2018-08-10 MED ORDER — FOLIC ACID 1 MG PO TABS: 1.0000 mg | ORAL_TABLET | Freq: Every day | ORAL | 0 refills | Status: DC

## 2018-08-10 MED ORDER — ENSURE ENLIVE PO LIQD: 237.0000 mL | Freq: Three times a day (TID) | ORAL | 0 refills | Status: DC

## 2018-08-10 MED ORDER — NICOTINE 21 MG/24HR TD PT24: 21.0000 mg | MEDICATED_PATCH | Freq: Every day | TRANSDERMAL | 0 refills | Status: DC

## 2018-08-10 MED ORDER — CARVEDILOL 6.25 MG PO TABS
6.2500 mg | ORAL_TABLET | Freq: Two times a day (BID) | ORAL | Status: DC
  Administered 2018-08-10 – 2018-08-11 (×4): 6.25 mg via ORAL
  Filled 2018-08-10 (×4): qty 1

## 2018-08-10 MED ORDER — CARVEDILOL 6.25 MG PO TABS: 6.2500 mg | ORAL_TABLET | Freq: Two times a day (BID) | ORAL | 0 refills | Status: DC

## 2018-08-10 MED ORDER — ASPIRIN EC 81 MG PO TBEC: 81.0000 mg | DELAYED_RELEASE_TABLET | Freq: Every day | ORAL | 3 refills | Status: DC

## 2018-08-10 MED ORDER — SACUBITRIL-VALSARTAN 24-26 MG PO TABS: 1.0000 | ORAL_TABLET | Freq: Two times a day (BID) | ORAL | 0 refills | Status: DC

## 2018-08-10 MED ORDER — ROSUVASTATIN CALCIUM 10 MG PO TABS: 10.0000 mg | ORAL_TABLET | Freq: Every day | ORAL | 0 refills | Status: DC

## 2018-08-10 NOTE — Progress Notes (Signed)
PROGRESS NOTE    Trevor Williams  BJY:[id number] DOB: 11-14-1960 DOA: 07/28/2018 PCP: Marliss Coots, NP    Brief Narrative; 58 year old with past medical history significant for tobacco use disorder, alcohol use disorder and a skin cancer presenting with progressive shortness of breath and dyspnea on exertion for 1 month.  Has had intermittent chest pain.  EMS: Patient was placed on a nonrebreather, given epinephrine, Solu-Medrol, magnesium and albuterol and brought to the ED.  In the ED patient was wheezing and hypoxic.  ABG: pH seven-point 3/46/47/23.  BNP elevated 412.  CRP less than 0.8.  CBC with microcytic anemia at baseline, thrombocytopenia.  Troponin mildly elevated.  EKG with sinus rhythm with possible lateral wall ischemia.  D-dimer was elevated.  CTA showed multifocal pneumonia and changes consistent with emphysema and possible coronary artery disease.  COVID-19 test was negative.  Patient was admitted with acute respiratory failure with hypoxemia secondary to pneumonia and possible COPD exacerbation. During hospitalization patient became more hypoxic, developed alcohol withdrawal and was transferred to ICU.  He was intubated from 5/12 and extubated on 5/16.  Patient was a started on Precedex for alcohol withdrawal.  Patient was treated with with broad-spectrum antibiotics for pneumonia. TRH resume care on 5/17.  Patient has been also be treated for heart failure exacerbation, he received IV Lasix 20 mg IV twice daily.  His weight has been coming down.  He was transitioned to oral Lasix.  Echocardiogram was performed and is consistent with systolic heart failure, ejection fraction 15 to 20%.  Cardiology has been consulted. for further recommendations.   Assessment & Plan:   Principal Problem:   Acute respiratory failure with hypoxia (HCC) Active Problems:   Community acquired pneumonia  1-acute hypoxic respiratory failure: Multifactorial secondary to pneumonia, COPD exacerbation  and possible CHF exacerbation. Initial ABG showed compensated respiratory acidosis and hypoxemia. Continue with nebulizer. He has received IV Lasix 20 mg IV twice daily. His weight is down Continue to monitor.  2-Community-acquired pneumonia: Noted on CT a chest.  Patient completed course of antibiotics.  COVID-19 negative x2.  RVP negative.  Blood and sputum cultures negative.  3-COPD exacerbation, no formal diagnosis but he has a 40-pack-year history.  Current smoker.. -Continue with LAMA/LABA-Anoro Ellipta -finished solumedrol.  -PRN nebulizer.   4-Acute systolic HF exacerbation: Presented with worsening dyspnea on exertion for months.  BNP elevated.  Started on IV Lasix he had good response to diuretics.  He was transitioned to oral Lasix. -Echocardiogram with reduced ejection fraction 15 to 20%, diffuse wall motion abnormality. -Cardiology has been consulted, will defer to cardiology if starting Entresto or ARB.   5-EKG changes, new T wave changes compared to prior EKG.  He denies any recent chest pain.  Reports chest pain weeks ago.  Enzymes negative. EKG this morning with less pronounced T wave inversion.  Improve after correction of hypomagnesemia and hypokalemia.. Cardiology has been consulted due to history of chest pain and new diagnosis of cardiomyopathy with low ejection fraction. Underwent cardiac catheterization nonobstructive cardiomyopathy.  6-alcohol use disorder/alcohol withdrawal: He reports drinking about half a paint of liquor a day. ETT from 5/12 to 5/16. Continue with multivitamins, thiamine and folic acid. He was treated with Precedex in the ICU.  Thrombocytopenia: Likely related to alcohol use monitor.  Severe protein caloric malnutrition.  Likely due to acute on chronic illness, alcohol use disorder.  Appreciate nutrition's input  Delirium: Impulsive and disoriented intermittently overnight. His Klonopin was reduced to 0.5 twice daily. Appears more  calm  today.  hypoKalemia/hypomagnesemia: Replace. Will start oral magnesium supplement. We will start oral potassium supplement while he is on Lasix.  Nutrition Problem: Increased nutrient needs Etiology: chronic illness(COPD, emphysema)    Signs/Symptoms: estimated needs    Interventions: MVI, Ensure Enlive (each supplement provides 350kcal and 20 grams of protein)  Estimated body mass index is 19.92 kg/m as calculated from the following:   Height as of this encounter: 5\' 7"  (1.702 m).   Weight as of this encounter: 57.7 kg.   DVT prophylaxis: SCDs Code Status; full code Family Communication: Care discussed with patient Disposition Plan: Patient with new diagnosis of systolic heart failure ejection fraction 15 to 20%, prior history of chest pain.  Still hypoxic with poor safety awareness.  Need to go to SNF.  Consultants:  CCM   Procedures:   ETT 5/12-5/16   Antimicrobials:  COVID (in house) 5/10 >> negative RVP 5/10 >> negative Blood 5/10 >> negative COVID (send out) 5/11 >> negative Sputum 5/12 >> normal flora   Subjective: No further nausea.  Breathing better.  No chest pain abdominal pain.  Initially wanted to go home instead of rehab.  Now possible going to rehab again.  Objective: Vitals:   08/09/18 2154 08/10/18 0500 08/10/18 0740 08/10/18 0745  BP: 118/67  103/72   Pulse: 86  92   Resp: 18  17   Temp: 98.8 F (37.1 C)  97.6 F (36.4 C)   TempSrc: Oral  Oral   SpO2: 97%  99% 99%  Weight:  57.7 kg    Height:        Intake/Output Summary (Last 24 hours) at 08/10/2018 1647 Last data filed at 08/10/2018 0500 Gross per 24 hour  Intake 825.54 ml  Output 925 ml  Net -99.46 ml   Filed Weights   08/09/18 0335 08/09/18 0551 08/10/18 0500  Weight: 59.8 kg 57.3 kg 57.7 kg    Examination:  General exam: Not acute distress Respiratory system: Crackles at the bases Cardiovascular system: S1, S2 regular rhythm and rate Gastrointestinal system: Bowel  sounds present, soft, nontender nondistended Central nervous system: Nonfocal Extremities: Symmetric power Skin: No rashes    Data Reviewed: I have personally reviewed following labs and imaging studies  CBC: Recent Labs  Lab 08/04/18 0450 08/05/18 0606  08/09/18 0628 08/09/18 0945 08/09/18 0946 08/09/18 2012 08/10/18 0541  WBC 14.3* 8.9  --  9.5  --   --  11.0* 9.7  NEUTROABS  --   --   --   --   --   --   --  7.4  HGB 9.9* 9.4*   < > 11.5* 10.5* 10.9* 11.0* 10.3*  HCT 29.5* 28.4*   < > 34.0* 31.0* 32.0* 32.5* 31.1*  MCV 110.9* 111.8*  --  108.6*  --   --  109.1* 109.1*  PLT 90* 101*  --  160  --   --  168 176   < > = values in this interval not displayed.   Basic Metabolic Panel: Recent Labs  Lab 08/04/18 0450  08/06/18 0443 08/07/18 0527 08/08/18 0224 08/09/18 0628 08/09/18 0945 08/09/18 0946 08/09/18 2012 08/10/18 0541  NA 138   < > 143 139 138 138 137 137  --  137  K 3.8   < > 3.9 3.1* 3.9 4.5 4.3 4.4  --  4.5  CL 107   < > 104 100 104 106  --   --   --  104  CO2 22   < >  26 29 25 23   --   --   --  23  GLUCOSE 123*   < > 137* 94 112* 97  --   --   --  113*  BUN 17   < > 25* 20 16 12   --   --   --  13  CREATININE 0.77   < > 0.91 0.83 0.74 0.77  --   --  0.85 0.79  CALCIUM 8.6*   < > 8.5* 8.3* 8.2* 8.4*  --   --   --  8.6*  MG 2.0  --  2.0 1.7 1.9 1.9  --   --   --  2.0  PHOS 4.3  --   --   --   --   --   --   --   --   --    < > = values in this interval not displayed.   GFR: Estimated Creatinine Clearance: 83.1 mL/min (by C-G formula based on SCr of 0.79 mg/dL). Liver Function Tests: Recent Labs  Lab 08/09/18 0628  AST 78*  ALT 129*  ALKPHOS 76  BILITOT 1.4*  PROT 5.8*  ALBUMIN 2.8*   No results for input(s): LIPASE, AMYLASE in the last 168 hours. No results for input(s): AMMONIA in the last 168 hours. Coagulation Profile: No results for input(s): INR, PROTIME in the last 168 hours. Cardiac Enzymes: Recent Labs  Lab 08/07/18 1449 08/07/18  2047 08/08/18 0224  TROPONINI 0.03* 0.03* 0.03*   BNP (last 3 results) No results for input(s): PROBNP in the last 8760 hours. HbA1C: No results for input(s): HGBA1C in the last 72 hours. CBG: Recent Labs  Lab 08/09/18 1221 08/09/18 1627 08/09/18 2108 08/10/18 0741 08/10/18 1119  GLUCAP 119* 90 101* 98 93   Lipid Profile: Recent Labs    08/10/18 0541  CHOL 87  HDL 26*  LDLCALC 45  TRIG 80  CHOLHDL 3.3   Thyroid Function Tests: No results for input(s): TSH, T4TOTAL, FREET4, T3FREE, THYROIDAB in the last 72 hours. Anemia Panel: No results for input(s): VITAMINB12, FOLATE, FERRITIN, TIBC, IRON, RETICCTPCT in the last 72 hours. Sepsis Labs: No results for input(s): PROCALCITON, LATICACIDVEN in the last 168 hours.  Recent Results (from the past 240 hour(s))  Novel Coronavirus, NAA (hospital order; send-out to ref lab)     Status: None   Collection Time: 08/05/18  5:35 PM  Result Value Ref Range Status   SARS-CoV-2, NAA NOT DETECTED NOT DETECTED Final    Comment: (NOTE) This test was developed and its performance characteristics determined by [employer]. This test has not been FDA cleared or approved. This test has been authorized by FDA under an Emergency Use Authorization (EUA). This test is only authorized for the duration of time the declaration that circumstances exist justifying the authorization of the emergency use of in vitro diagnostic tests for detection of SARS-CoV-2 virus and/or diagnosis of COVID-19 infection under section 564(b)(1) of the Act, 21 U.S.C. 053ZJQ-7(H)(4), unless the authorization is terminated or revoked sooner. When diagnostic testing is negative, the possibility of a false negative result should be considered in the context of a patient's recent exposures and the presence of clinical signs and symptoms consistent with COVID-19. An individual without symptoms of COVID-19 and who is not shedding SARS-CoV-2 virus would expect to  have a negative (not detected) result in this assay. Performed  At: [facility] [address] [place], [place] [id number] Rush Farmer MD [id number]    [place]  NASOPHARYNGEAL  Final    Comment: Performed at [facility], [place] [address], [place], [place] [address]         Radiology Studies: No results found.      Scheduled Meds: . aspirin  81 mg Oral Daily  . carvedilol  6.25 mg Oral BID WC  . Chlorhexidine Gluconate Cloth  6 each Topical Q0600  . famotidine  20 mg Oral BID  . feeding supplement (ENSURE ENLIVE)  237 mL Oral TID BM  . folic acid  1 mg Oral Daily  . guaiFENesin  5 mL Oral BID  . heparin  5,000 Units Subcutaneous Q8H  . magnesium oxide  200 mg Oral BID  . mouth rinse  15 mL Mouth Rinse BID  . multivitamin with minerals  1 tablet Oral Daily  . nicotine  21 mg Transdermal Daily  . rosuvastatin  10 mg Oral q1800  . sacubitril-valsartan  1 tablet Oral BID  . sodium chloride flush  3 mL Intravenous Q12H  . thiamine  100 mg Oral Daily  . traZODone  50 mg Oral QHS  . umeclidinium-vilanterol  1 puff Inhalation Daily   Continuous Infusions: . sodium chloride 10 mL/hr at 08/01/18 2200  . sodium chloride       LOS: 12 days    Time spent: 35 minutes.     Berle Mull, MD [facility]  If 7PM-7AM, please contact night-coverage [email or web address] Password [facility] 08/10/2018, 4:47 PM

## 2018-08-10 NOTE — Progress Notes (Signed)
Progress Note  Patient Name: Trevor Williams Date of Encounter: 08/10/2018  Primary Cardiologist: Dr Aundra Dubin  Subjective   Denies dyspnea or CP  Inpatient Medications    Scheduled Meds: . aspirin  81 mg Oral Daily  . carvedilol  3.125 mg Oral BID WC  . Chlorhexidine Gluconate Cloth  6 each Topical Q0600  . famotidine  20 mg Oral BID  . feeding supplement (ENSURE ENLIVE)  237 mL Oral TID BM  . folic acid  1 mg Oral Daily  . guaiFENesin  5 mL Oral BID  . heparin  5,000 Units Subcutaneous Q8H  . magnesium oxide  200 mg Oral BID  . mouth rinse  15 mL Mouth Rinse BID  . multivitamin with minerals  1 tablet Oral Daily  . nicotine  21 mg Transdermal Daily  . rosuvastatin  10 mg Oral q1800  . sacubitril-valsartan  1 tablet Oral BID  . sodium chloride flush  3 mL Intravenous Q12H  . thiamine  100 mg Oral Daily  . traZODone  50 mg Oral QHS  . umeclidinium-vilanterol  1 puff Inhalation Daily   Continuous Infusions: . sodium chloride 10 mL/hr at 08/01/18 2200  . sodium chloride     PRN Meds: sodium chloride, acetaminophen, bisacodyl, clonazePAM, docusate, ipratropium-albuterol, ondansetron (ZOFRAN) IV, sodium chloride flush   Vital Signs    Vitals:   08/09/18 1006 08/09/18 1224 08/09/18 2154 08/10/18 0500  BP: 106/75 100/76 118/67   Pulse: 97 95 86   Resp: 19 18 18    Temp:  98.2 F (36.8 C) 98.8 F (37.1 C)   TempSrc:  Oral Oral   SpO2: 99% 100% 97%   Weight:    57.7 kg  Height:        Intake/Output Summary (Last 24 hours) at 08/10/2018 0715 Last data filed at 08/10/2018 0500 Gross per 24 hour  Intake 875.54 ml  Output 925 ml  Net -49.46 ml   Last 3 Weights 08/10/2018 08/09/2018 08/09/2018  Weight (lbs) 127 lb 3.3 oz 126 lb 5.2 oz 131 lb 13.4 oz  Weight (kg) 57.7 kg 57.3 kg 59.8 kg      Telemetry    Sinus - Personally Reviewed  Physical Exam   GEN: No acute distress.   Neck: No JVD Cardiac: RRR, no murmurs, rubs, or gallops.  Respiratory: Clear to  auscultation bilaterally. GI: Soft, nontender, non-distended  MS: No edema; radial cath site with no hematoma Neuro:  Nonfocal  Psych: Normal affect   Labs    Chemistry Recent Labs  Lab 08/07/18 0527 08/08/18 0224 08/09/18 0628 08/09/18 0945 08/09/18 0946 08/09/18 2012  NA 139 138 138 137 137  --   K 3.1* 3.9 4.5 4.3 4.4  --   CL 100 104 106  --   --   --   CO2 29 25 23   --   --   --   GLUCOSE 94 112* 97  --   --   --   BUN 20 16 12   --   --   --   CREATININE 0.83 0.74 0.77  --   --  0.85  CALCIUM 8.3* 8.2* 8.4*  --   --   --   PROT  --   --  5.8*  --   --   --   ALBUMIN  --   --  2.8*  --   --   --   AST  --   --  78*  --   --   --  ALT  --   --  129*  --   --   --   ALKPHOS  --   --  76  --   --   --   BILITOT  --   --  1.4*  --   --   --   GFRNONAA >60 >60 >60  --   --  >60  GFRAA >60 >60 >60  --   --  >60  ANIONGAP 10 9 9   --   --   --      Hematology Recent Labs  Lab 08/09/18 0628  08/09/18 0946 08/09/18 2012 08/10/18 0541  WBC 9.5  --   --  11.0* 9.7  RBC 3.13*  --   --  2.98* 2.85*  HGB 11.5*   < > 10.9* 11.0* 10.3*  HCT 34.0*   < > 32.0* 32.5* 31.1*  MCV 108.6*  --   --  109.1* 109.1*  MCH 36.7*  --   --  36.9* 36.1*  MCHC 33.8  --   --  33.8 33.1  RDW 12.1  --   --  12.1 12.2  PLT 160  --   --  168 176   < > = values in this interval not displayed.    Cardiac Enzymes Recent Labs  Lab 08/07/18 1449 08/07/18 2047 08/08/18 0224  TROPONINI 0.03* 0.03* 0.03*    Patient Profile     58 y.o. male with past medical history of alcohol abuse, cirrhosis, tobacco abuse, COPD admitted with pneumonia and found to have new acute systolic congestive heart failure.  Ejection fraction was 15 to 20%.  Cardiac catheterization revealed nonobstructive coronary disease and low filling pressures PCWP 2.  Assessment & Plan    1 cardiomyopathy-cardiac catheterization revealed nonobstructive coronary disease.  Etiology likely secondary to alcohol abuse.  Plan  medical therapy.  Continue Entresto.  Increase carvedilol to 6.25 mg twice daily.  Titrate medications following discharge.  Repeat echocardiogram in 3 to 4 months.  Hopefully LV function will have improved with medical therapy and avoiding alcohol.  2 acute systolic congestive heart failure-patient was diuresed and based on cardiac catheterization filling pressures were low.  Diuretics discontinued.  Would not diurese further.  3 alcohol abuse-patient counseled on discontinuing.  4 tobacco abuse-patient counseled on discontinuing.  5 pneumonia-patient has completed antibiotics.  6 coronary artery disease-continue aspirin and statin.  Check lipids and liver in 8 weeks.  7 COPD-management per primary care.  [facility] will sign off.   Medication Recommendations:  Continue present cardiac meds as listed in [facility] Other recommendations (labs, testing, etc):  No additional recommendations Follow up as an outpatient:  Dr Aundra Dubin 4 weeks following DC  For questions or updates, please contact [place] Please consult [email or web address] for contact info under        Signed, Kirk Ruths, MD  08/10/2018, 7:15 AM

## 2018-08-10 NOTE — Care Management (Signed)
Spoke w patient, he states that he would like to DC to SNF, CM verified attending and CSW were aware of his choice.

## 2018-08-11 LAB — BASIC METABOLIC PANEL
Anion gap: 10 (ref 5–15)
BUN: 13 mg/dL (ref 6–20)
CO2: 23 mmol/L (ref 22–32)
Calcium: 8.8 mg/dL — ABNORMAL LOW (ref 8.9–10.3)
Chloride: 103 mmol/L (ref 98–111)
Creatinine, Ser: 0.79 mg/dL (ref 0.61–1.24)
GFR calc Af Amer: >60 mL/min (ref >60)
GFR calc non Af Amer: >60 mL/min (ref >60)
Glucose, Bld: 90 mg/dL (ref 70–99)
Potassium: 4.5 mmol/L (ref 3.5–5.1)
Sodium: 136 mmol/L (ref 135–145)

## 2018-08-11 LAB — CBC WITH DIFFERENTIAL/PLATELET
Abs Immature Granulocytes: 0.06 10*3/uL (ref 0.00–0.07)
Basophils Absolute: 0 10*3/uL (ref 0.0–0.1)
Basophils Relative: 0 %
Eosinophils Absolute: 0.4 10*3/uL (ref 0.0–0.5)
Eosinophils Relative: 4 %
HCT: 31.6 % — ABNORMAL LOW (ref 39.0–52.0)
Hemoglobin: 10.6 g/dL — ABNORMAL LOW (ref 13.0–17.0)
Immature Granulocytes: 1 %
Lymphocytes Relative: 13 %
Lymphs Abs: 1.3 10*3/uL (ref 0.7–4.0)
MCH: 36.7 pg — ABNORMAL HIGH (ref 26.0–34.0)
MCHC: 33.5 g/dL (ref 30.0–36.0)
MCV: 109.3 fL — ABNORMAL HIGH (ref 80.0–100.0)
Monocytes Absolute: 1.1 10*3/uL — ABNORMAL HIGH (ref 0.1–1.0)
Monocytes Relative: 11 %
Neutro Abs: 7.5 10*3/uL (ref 1.7–7.7)
Neutrophils Relative %: 71 %
Platelets: 190 10*3/uL (ref 150–400)
RBC: 2.89 MIL/uL — ABNORMAL LOW (ref 4.22–5.81)
RDW: 11.9 % (ref 11.5–15.5)
WBC: 10.4 10*3/uL (ref 4.0–10.5)
nRBC: 0 % (ref 0.0–0.2)

## 2018-08-11 MED ORDER — ONDANSETRON HCL 4 MG/2ML IJ SOLN: 4.0000 mg | Freq: Four times a day (QID) | INTRAMUSCULAR | Status: DC | PRN

## 2018-08-11 MED ORDER — GUAIFENESIN 100 MG/5ML PO SOLN: 5.0000 mL | Freq: Four times a day (QID) | ORAL | Status: DC | PRN

## 2018-08-11 MED ORDER — ASPIRIN EC 81 MG PO TBEC
81.0000 mg | DELAYED_RELEASE_TABLET | Freq: Every day | ORAL | Status: DC
  Administered 2018-08-11 – 2018-08-12 (×2): 81 mg via ORAL
  Filled 2018-08-11 (×2): qty 1

## 2018-08-11 MED ORDER — ENOXAPARIN SODIUM 40 MG/0.4ML ~~LOC~~ SOLN
40.0000 mg | SUBCUTANEOUS | Status: DC
  Administered 2018-08-11: 40 mg via SUBCUTANEOUS
  Filled 2018-08-11: qty 0.4

## 2018-08-11 NOTE — Plan of Care (Signed)
  Problem: Education: Goal: Knowledge of General Education information will improve Description Including pain rating scale, medication(s)/side effects and non-pharmacologic comfort measures Outcome: Progressing   Problem: Nutrition: Goal: Adequate nutrition will be maintained Outcome: Progressing   Problem: Coping: Goal: Level of anxiety will decrease Outcome: Progressing   Problem: Pain Managment: Goal: General experience of comfort will improve Outcome: Progressing   Problem: Activity: Goal: Ability to tolerate increased activity will improve Outcome: Progressing

## 2018-08-11 NOTE — Progress Notes (Signed)
Physical Therapy Treatment Patient Details Name: Trevor Williams MRN: [id number] DOB: Jun 30, 1960 Today's Date: 08/11/2018    History of Present Illness Patient is a 58 y.o. male with PMH: ETOH abuse, active tobacco user, COPD, empysema, skin cancer, who was admitted for dyspnea, wheezing and hypoxia. He developed alcohol withdrawl, respiratory failure and right lower lung infiltrate and was intubated on 5/12 and extubated on 5/16 in AM.     PT Comments    Pt progressing well towards all goals. Pt functioning near baseline. Patient ambulating without assist or AD and is not demonstrating instability. Pt with  R LE Limp due to R LE shorter than the L. Although it isn't an ideal lifestyle pt is functioning near baseline and is safe to return to his tent once medically stable. Acute PT to cont to follow.   Follow Up Recommendations  No PT follow up     Equipment Recommendations  None recommended by PT    Recommendations for Other Services       Precautions / Restrictions Precautions Precautions: Fall Precaution Comments: pt with R LE shorter than the other Restrictions Weight Bearing Restrictions: No    Mobility  Bed Mobility Overal bed mobility: Independent Bed Mobility: Supine to Sit;Sit to Supine     Supine to sit: Independent Sit to supine: Independent   General bed mobility comments: HOB was elevated but no difficulty or physical assist needed  Transfers Overall transfer level: Independent Equipment used: None Transfers: Sit to/from Stand Sit to Stand: Independent         General transfer comment: no difficulty, observed amb in room without assist  Ambulation/Gait Ambulation/Gait assistance: Supervision Gait Distance (Feet): 300 Feet Assistive device: None Gait Pattern/deviations: Antalgic;Step-through pattern Gait velocity: wfl Gait velocity interpretation: >2.62 ft/sec, indicative of community ambulatory General Gait Details: pt's R LE shorter than the L  reulting in R LE limp during ambulation, pt with no episodes of LOB and was able to withstand moderate pertebations and maintain balance   Stairs Stairs: Yes Stairs assistance: Min guard Stair Management: One rail Left;Alternating pattern;Step to pattern Number of Stairs: 10 General stair comments: pt with caught R foot on first step going up but was able to ascend the rest of the way safely, pt step to descending, pt reports "i dont have to do any stairs and I've never been one for stairs anyways"   Wheelchair Mobility    Modified Rankin (Stroke Patients Only)       Balance Overall balance assessment: Modified Independent                               Standardized Balance Assessment Standardized Balance Assessment : Dynamic Gait Index   Dynamic Gait Index Level Surface: Mild Impairment Change in Gait Speed: Mild Impairment Gait with Horizontal Head Turns: Mild Impairment Gait with Vertical Head Turns: Mild Impairment Gait and Pivot Turn: Mild Impairment Step Over Obstacle: Mild Impairment Step Around Obstacles: Mild Impairment Steps: Mild Impairment Total Score: 16      Cognition Arousal/Alertness: Awake/alert Behavior During Therapy: WFL for tasks assessed/performed Overall Cognitive Status: No family/caregiver present to determine baseline cognitive functioning                                 General Comments: pt has been homeless for 2 years, told me he pan-handled for food and stood on a corner  with a sign, when asked what he would do if his tent was gone he said that he has a friend who also lives in a tent near him and no one knows about their area becuase it's coverred by greens and trees      Exercises      General Comments General comments (skin integrity, edema, etc.): SpO2 >88% on RA      Pertinent Vitals/Pain Pain Assessment: No/denies pain    Home Living                      Prior Function            PT  Goals (current goals can now be found in the care plan section) Acute Rehab PT Goals Patient Stated Goal: get to my tent Progress towards PT goals: Progressing toward goals    Frequency    Min 3X/week      PT Plan Discharge plan needs to be updated    Co-evaluation              AM-PAC PT "6 Clicks" Mobility   Outcome Measure  Help needed turning from your back to your side while in a flat bed without using bedrails?: None Help needed moving from lying on your back to sitting on the side of a flat bed without using bedrails?: None Help needed moving to and from a bed to a chair (including a wheelchair)?: None Help needed standing up from a chair using your arms (e.g., wheelchair or bedside chair)?: None Help needed to walk in hospital room?: None Help needed climbing 3-5 steps with a railing? : A Little 6 Click Score: 23    End of Session Equipment Utilized During Treatment: Gait belt Activity Tolerance: Patient tolerated treatment well Patient left: in bed;with call bell/phone within reach Nurse Communication: Mobility status PT Visit Diagnosis: Unsteadiness on feet (R26.81);Muscle weakness (generalized) (M62.81);History of falling (Z91.81);Ataxic gait (R26.0);Difficulty in walking, not elsewhere classified (R26.2)     Time: 0865-7846 PT Time Calculation (min) (ACUTE ONLY): 14 min  Charges:  $Gait Training: 8-22 mins                     Kittie Plater, PT, DPT Acute Rehabilitation Services Pager #: [phone] Office #: [phone]    Berline Lopes 08/11/2018, 2:07 PM

## 2018-08-11 NOTE — Progress Notes (Signed)
PROGRESS NOTE    Trevor Williams  FIE:[id number] DOB: 1961/01/30 DOA: 07/28/2018 PCP: Marliss Coots, NP    Brief Narrative; 58 year old with past medical history significant for tobacco use disorder, alcohol use disorder and a skin cancer presenting with progressive shortness of breath and dyspnea on exertion for 1 month.  Has had intermittent chest pain.  EMS: Patient was placed on a nonrebreather, given epinephrine, Solu-Medrol, magnesium and albuterol and brought to the ED.  In the ED patient was wheezing and hypoxic.  ABG: pH seven-point 3/46/47/23.  BNP elevated 412.  CRP less than 0.8.  CBC with microcytic anemia at baseline, thrombocytopenia.  Troponin mildly elevated.  EKG with sinus rhythm with possible lateral wall ischemia.  D-dimer was elevated.  CTA showed multifocal pneumonia and changes consistent with emphysema and possible coronary artery disease.  COVID-19 test was negative.  Patient was admitted with acute respiratory failure with hypoxemia secondary to pneumonia and possible COPD exacerbation. During hospitalization patient became more hypoxic, developed alcohol withdrawal and was transferred to ICU.  He was intubated from 5/12 and extubated on 5/16.  Patient was a started on Precedex for alcohol withdrawal.  Patient was treated with with broad-spectrum antibiotics for pneumonia. TRH resume care on 5/17.  Patient has been also be treated for heart failure exacerbation, he received IV Lasix 20 mg IV twice daily.  His weight has been coming down.  He was transitioned to oral Lasix.  Echocardiogram was performed and is consistent with systolic heart failure, ejection fraction 15 to 20%.  Cardiology has been consulted. for further recommendations.   Assessment & Plan:   Principal Problem:   Acute respiratory failure with hypoxia (HCC) Active Problems:   Community acquired pneumonia  1-acute hypoxic respiratory failure: Multifactorial secondary to pneumonia, COPD exacerbation  and possible CHF exacerbation. Initial ABG showed compensated respiratory acidosis and hypoxemia. Continue with nebulizer. He has received IV Lasix 20 mg IV twice daily. His weight is down Continue to monitor.  2-Community-acquired pneumonia: Noted on CT a chest.  Patient completed course of antibiotics.  COVID-19 negative x2.  RVP negative.  Blood and sputum cultures negative.  3-COPD exacerbation, no formal diagnosis but he has a 40-pack-year history.  Current smoker.. -Continue with LAMA/LABA-Anoro Ellipta -finished solumedrol.  -PRN nebulizer.   4-Acute systolic HF exacerbation: Presented with worsening dyspnea on exertion for months.  BNP elevated.  Started on IV Lasix he had good response to diuretics.  He was transitioned to oral Lasix. -Echocardiogram with reduced ejection fraction 15 to 20%, diffuse wall motion abnormality. -Cardiology has been consulted,  5-EKG changes, new T wave changes compared to prior EKG.  He denies any recent chest pain.  Reports chest pain weeks ago.  Enzymes negative. EKG this morning with less pronounced T wave inversion.  Improve after correction of hypomagnesemia and hypokalemia.. Cardiology has been consulted due to history of chest pain and new diagnosis of cardiomyopathy with low ejection fraction. Underwent cardiac catheterization nonobstructive cardiomyopathy.  6-alcohol use disorder/alcohol withdrawal: He reports drinking about half a paint of liquor a day. ETT from 5/12 to 5/16. Continue with multivitamins, thiamine and folic acid. He was treated with Precedex in the ICU.  Thrombocytopenia: Likely related to alcohol use monitor.  Severe protein caloric malnutrition.  Likely due to acute on chronic illness, alcohol use disorder.  Appreciate nutrition's input  Delirium: Impulsive and disoriented intermittently overnight. His Klonopin was reduced to 0.5 twice daily.  hypoKalemia/hypomagnesemia: Replace. Will start oral magnesium  supplement. We will start  oral potassium supplement while he is on Lasix.  Nutrition Problem: Increased nutrient needs Etiology: chronic illness(COPD, emphysema)    Signs/Symptoms: estimated needs    Interventions: MVI, Ensure Enlive (each supplement provides 350kcal and 20 grams of protein)  Estimated body mass index is 19.03 kg/m as calculated from the following:   Height as of this encounter: 5\' 7"  (1.702 m).   Weight as of this encounter: 55.1 kg.   DVT prophylaxis: SCDs Code Status; full code Family Communication: Care discussed with patient Disposition Plan: Patient with new diagnosis of systolic heart failure ejection fraction 15 to 20%, prior history of chest pain.  Patient unable to afford his medication. Plan is to provide medication assistance from the hospital.  Will be discharged on Monday after availability of the medications.  Consultants:  CCM   Procedures:   ETT 5/12-5/16   Antimicrobials:  COVID (in house) 5/10 >> negative RVP 5/10 >> negative Blood 5/10 >> negative COVID (send out) 5/11 >> negative Sputum 5/12 >> normal flora   Subjective: No nausea no vomiting no chest pain abdominal pain.  No fever no chills.  Objective: Vitals:   08/11/18 0500 08/11/18 0738 08/11/18 0747 08/11/18 1639  BP:  94/66  97/71  Pulse:  84  83  Resp:  18  17  Temp:  97.9 F (36.6 C)  98.6 F (37 C)  TempSrc:  Oral  Oral  SpO2:  100% 99% 99%  Weight: 55.1 kg     Height:        Intake/Output Summary (Last 24 hours) at 08/11/2018 2010 Last data filed at 08/11/2018 0500 Gross per 24 hour  Intake -  Output 700 ml  Net -700 ml   Filed Weights   08/09/18 0551 08/10/18 0500 08/11/18 0500  Weight: 57.3 kg 57.7 kg 55.1 kg    Examination:  General exam: Not acute distress Respiratory system: Crackles at the bases Cardiovascular system: S1, S2 regular rhythm and rate Gastrointestinal system: Bowel sounds present, soft, nontender nondistended Central  nervous system: Nonfocal Extremities: Symmetric power Skin: No rashes    Data Reviewed: I have personally reviewed following labs and imaging studies  CBC: Recent Labs  Lab 08/05/18 0606  08/09/18 0628 08/09/18 0945 08/09/18 0946 08/09/18 2012 08/10/18 0541 08/11/18 0454  WBC 8.9  --  9.5  --   --  11.0* 9.7 10.4  NEUTROABS  --   --   --   --   --   --  7.4 7.5  HGB 9.4*   < > 11.5* 10.5* 10.9* 11.0* 10.3* 10.6*  HCT 28.4*   < > 34.0* 31.0* 32.0* 32.5* 31.1* 31.6*  MCV 111.8*  --  108.6*  --   --  109.1* 109.1* 109.3*  PLT 101*  --  160  --   --  168 176 190   < > = values in this interval not displayed.   Basic Metabolic Panel: Recent Labs  Lab 08/06/18 0443 08/07/18 0527 08/08/18 0224 08/09/18 5284 08/09/18 0945 08/09/18 0946 08/09/18 2012 08/10/18 0541 08/11/18 0454  NA 143 139 138 138 137 137  --  137 136  K 3.9 3.1* 3.9 4.5 4.3 4.4  --  4.5 4.5  CL 104 100 104 106  --   --   --  104 103  CO2 26 29 25 23   --   --   --  23 23  GLUCOSE 137* 94 112* 97  --   --   --  113*  90  BUN 25* 20 16 12   --   --   --  13 13  CREATININE 0.91 0.83 0.74 0.77  --   --  0.85 0.79 0.79  CALCIUM 8.5* 8.3* 8.2* 8.4*  --   --   --  8.6* 8.8*  MG 2.0 1.7 1.9 1.9  --   --   --  2.0  --    GFR: Estimated Creatinine Clearance: 79.4 mL/min (by C-G formula based on SCr of 0.79 mg/dL). Liver Function Tests: Recent Labs  Lab 08/09/18 0628  AST 78*  ALT 129*  ALKPHOS 76  BILITOT 1.4*  PROT 5.8*  ALBUMIN 2.8*   No results for input(s): LIPASE, AMYLASE in the last 168 hours. No results for input(s): AMMONIA in the last 168 hours. Coagulation Profile: No results for input(s): INR, PROTIME in the last 168 hours. Cardiac Enzymes: Recent Labs  Lab 08/07/18 1449 08/07/18 2047 08/08/18 0224  TROPONINI 0.03* 0.03* 0.03*   BNP (last 3 results) No results for input(s): PROBNP in the last 8760 hours. HbA1C: No results for input(s): HGBA1C in the last 72 hours. CBG: Recent Labs   Lab 08/09/18 1627 08/09/18 2108 08/10/18 0741 08/10/18 1119 08/10/18 1655  GLUCAP 90 101* 98 93 108*   Lipid Profile: Recent Labs    08/10/18 0541  CHOL 87  HDL 26*  LDLCALC 45  TRIG 80  CHOLHDL 3.3   Thyroid Function Tests: No results for input(s): TSH, T4TOTAL, FREET4, T3FREE, THYROIDAB in the last 72 hours. Anemia Panel: No results for input(s): VITAMINB12, FOLATE, FERRITIN, TIBC, IRON, RETICCTPCT in the last 72 hours. Sepsis Labs: No results for input(s): PROCALCITON, LATICACIDVEN in the last 168 hours.  Recent Results (from the past 240 hour(s))  Novel Coronavirus, NAA (hospital order; send-out to ref lab)     Status: None   Collection Time: 08/05/18  5:35 PM  Result Value Ref Range Status   SARS-CoV-2, NAA NOT DETECTED NOT DETECTED Final    Comment: (NOTE) This test was developed and its performance characteristics determined by [employer]. This test has not been FDA cleared or approved. This test has been authorized by FDA under an Emergency Use Authorization (EUA). This test is only authorized for the duration of time the declaration that circumstances exist justifying the authorization of the emergency use of in vitro diagnostic tests for detection of SARS-CoV-2 virus and/or diagnosis of COVID-19 infection under section 564(b)(1) of the Act, 21 U.S.C. 419QQI-2(L)(7), unless the authorization is terminated or revoked sooner. When diagnostic testing is negative, the possibility of a false negative result should be considered in the context of a patient's recent exposures and the presence of clinical signs and symptoms consistent with COVID-19. An individual without symptoms of COVID-19 and who is not shedding SARS-CoV-2 virus would expect to have a negative (not detected) result in this assay. Performed  At: [facility] [address] [place], [place] [id number] Rush Farmer MD DE:[id number]    [place]  Final     Comment: Performed at [facility], [place] [address], [place], [place] [address]         Radiology Studies: No results found.      Scheduled Meds: . aspirin EC  81 mg Oral Daily  . carvedilol  6.25 mg Oral BID WC  . Chlorhexidine Gluconate Cloth  6 each Topical Q0600  . enoxaparin (LOVENOX) injection  40 mg Subcutaneous Q24H  . famotidine  20 mg Oral BID  . feeding  supplement (ENSURE ENLIVE)  237 mL Oral TID BM  . folic acid  1 mg Oral Daily  . magnesium oxide  200 mg Oral BID  . mouth rinse  15 mL Mouth Rinse BID  . multivitamin with minerals  1 tablet Oral Daily  . nicotine  21 mg Transdermal Daily  . rosuvastatin  10 mg Oral q1800  . sacubitril-valsartan  1 tablet Oral BID  . thiamine  100 mg Oral Daily  . traZODone  50 mg Oral QHS  . umeclidinium-vilanterol  1 puff Inhalation Daily   Continuous Infusions: . sodium chloride Stopped (08/11/18 0513)     LOS: 13 days    Time spent: 35 minutes.     Berle Mull, MD [facility]  If 7PM-7AM, please contact night-coverage [email or web address] Password [email or web address] 08/11/2018, 8:10 PM

## 2018-08-12 MED ORDER — ENSURE ENLIVE PO LIQD: 237.0000 mL | Freq: Three times a day (TID) | ORAL | 0 refills | Status: DC

## 2018-08-12 MED ORDER — ROSUVASTATIN CALCIUM 10 MG PO TABS: 10.0000 mg | ORAL_TABLET | Freq: Every day | ORAL | 0 refills | Status: DC

## 2018-08-12 MED ORDER — CARVEDILOL 3.125 MG PO TABS: 3.1250 mg | ORAL_TABLET | Freq: Two times a day (BID) | ORAL | Status: DC

## 2018-08-12 MED ORDER — FOLIC ACID 1 MG PO TABS: 1.0000 mg | ORAL_TABLET | Freq: Every day | ORAL | 0 refills | Status: DC

## 2018-08-12 MED ORDER — SACUBITRIL-VALSARTAN 24-26 MG PO TABS: 1.0000 | ORAL_TABLET | Freq: Two times a day (BID) | ORAL | 0 refills | Status: DC

## 2018-08-12 MED ORDER — CARVEDILOL 3.125 MG PO TABS: 3.1250 mg | ORAL_TABLET | Freq: Two times a day (BID) | ORAL | 0 refills | Status: DC

## 2018-08-12 MED ORDER — ASPIRIN EC 81 MG PO TBEC: 81.0000 mg | DELAYED_RELEASE_TABLET | Freq: Every day | ORAL | 0 refills | Status: DC

## 2018-08-12 MED ORDER — NICOTINE 21 MG/24HR TD PT24: 21.0000 mg | MEDICATED_PATCH | Freq: Every day | TRANSDERMAL | 0 refills | Status: DC

## 2018-08-12 MED ORDER — CARVEDILOL 3.125 MG PO TABS
3.1250 mg | ORAL_TABLET | Freq: Two times a day (BID) | ORAL | Status: DC
  Administered 2018-08-12: 3.125 mg via ORAL

## 2018-08-12 MED ORDER — THIAMINE HCL 100 MG PO TABS: 100.0000 mg | ORAL_TABLET | Freq: Every day | ORAL | 0 refills | Status: DC

## 2018-08-12 NOTE — Progress Notes (Signed)
CSW provided taxi vouchers to the RN so that the patient could pick up his medications then go back to his tent.   CSW signing off.   Domenic Schwab, MSW, [place]

## 2018-08-12 NOTE — TOC Transition Note (Addendum)
Transition of Care (TOC) - CM/SW Discharge Note   Patient Details  Name: Trevor Williams MRN: [id number] Date of Birth: 01/01/1961  Transition of Care (TOC) CM/SW Contact:  Natalie Gay RN, BSN, NCM-BC, ACM-RN [phone] Phone Number: 08/12/2018, 12:20 PM   Clinical Narrative:    CM consult acknowledged for medication assistance. CM met with the patient to discuss the POC. Patient states he is homeless, lives in a tent near "[employer]." Patient verified as having no active health insurance or money to afford his Rxs. MATCH completed, with the Entresto 30-day free information provided to [address] [place], [place] [address]. The patient will be provided (2)cab vouchers for transportation to the pharmacy and then to his tent. The MATCH letter will be faxed to [facility] and provided to the patient; override was provided d/t patient inability to afford; MATCH will not cover OTC Rx with the patient informed. No further needs from CM.    .     Final next level of care: Home/Self Care Barriers to Discharge: No Barriers Identified   Patient Goals and CMS Choice Patient states their goals for this hospitalization and ongoing recovery are:: "I'm ready to eat and go home" CMS Medicare.gov Compare Post Acute Care list provided to:: Patient Choice offered to / list presented to : NA  Discharge Plan and Services In-house Referral: Clinical Social Work Discharge Planning Services: Medication Assistance, MATCH Program(Entresto 30-day free card) Post Acute Care Choice: Skilled Nursing Facility          DME Arranged: N/A DME Agency: NA       HH Arranged: NA HH Agency: NA        Social Determinants of Health (SDOH) Interventions     Readmission Risk Interventions No flowsheet data found.     

## 2018-08-12 NOTE — Progress Notes (Signed)
   Dr. Posey Pronto paged to request appointment date/time be put on patient's discharge paperwork due to his social situation. Patient is homeless and would otherwise not have reliable access to a phone to receive appointment instructions. Dr. Stanford Breed had recommended CHF clinic f/u in 4 weeks. Heartcare and CHF offices are closed. I spoke to Dr. Haroldine Laws and we are unable to accurately schedule on the CHF schedule with the office being closed, due to need to coordinate social distancing with office staff. Our [facility] team has built in-office days to see this type of patient. Was able to schedule for 6/8 on my flex day. At that time we can further help coordinate f/u with the CHF clinic. Appointment info placed on AVS.   Melina Copa PA-C

## 2018-08-16 NOTE — Discharge Summary (Signed)
[facility] Discharge Summary   Patient: Trevor Williams ZJI:[id number]   PCP: Trevor Coots, NP DOB: 1960/07/27   Date of admission: 07/28/2018   Date of discharge: 08/12/2018     Discharge Diagnoses:   Principal Problem:   Acute respiratory failure with hypoxia [facility]) Active Problems:   Community acquired pneumonia   Admitted From: home Disposition:  home  Recommendations for Outpatient Follow-up:  1. Please follow up with cardiology ans PCP in 1 week   Follow-up Information    Placey, Trevor Muscat, NP. Schedule an appointment as soon as possible for a visit in 1 week(s).   Contact information: [place] [address] [phone]        Trevor Pitter, PA-C Follow up.   Specialties:  Cardiology, Radiology Why:  [facility] - 08/26/18 at 1:45pm. Please arrive at 1:30pm to check in. The office is on the 3rd foor of the [facility] building on [address]. Trevor Williams is one of the PAs that works with the cardiology team. Contact information: [address] [place] [address] [phone]          Diet recommendation: cardiac diet  Activity: The patient is advised to gradually reintroduce usual activities.  Discharge Condition: good  Code Status: full code  History of present illness: As per the H and P dictated on admission, "Trevor Williams is a 58 y.o. male with history of tobacco abuse and [place] to [place] ER because of persistent shortness of breath ongoing for last 1 month.  Patient states he is getting short of breath on exertion which is progressively worsened.  At times he also gets some central chest pain which last for few minutes.  EMS was called and patient was placed on nonrebreather given epinephrine Solu-Medrol magnesium and albuterol and was brought to the ER.  ED Course: By the time patient reached ER patient's wheezing is improved.  CT angiogram of the chest was done since  d-dimer was elevated.  Shows multifocal pneumonia and changes consistent with emphysema and also coronary artery disease.  COVID-19 test at the hospital was negative.  Patient was started on empiric antibiotics for pneumonia and admitted for further management.  Labs also revealed BNP of 412 troponin less than 0.03 CRP less than 0.8 hemoglobin 11.2 MCV 110 platelets 47.  EKG with normal sinus rhythm with possible lateral wall ischemia."  Hospital Course:  Summary of his active problems in the hospital is as following. acute hypoxic respiratory failure:  Multifactorial secondary to pneumonia, COPD exacerbation and possible CHF exacerbation. Initial ABG showed compensated respiratory acidosis and hypoxemia. He has received IV Lasix His weight is down and now on room air.  Community-acquired pneumonia:  Noted on CT a chest.   Patient completed course of antibiotics.  COVID-19 negative x2.  RVP negative.  Blood and sputum cultures negative.  COPD exacerbation, no formal diagnosis but he has a 40-pack-year history.  Current smoker. No active wheezing, recommended outpatient follow up for definitive diagnosis and treatment  finished solumedrol.   Acute systolic HF exacerbation Alcoholic non ischemic cardiomyopathy Presented with worsening dyspnea on exertion for months.  BNP elevated.   Started on IV Lasix he had good response to diuretics. He was transitioned to oral Lasix. Echocardiogram with reduced ejection fraction 15 to 20%, diffuse wall motion abnormality. Cardiology has been consulted,  EKG changes, new T wave changes compared to prior EKG.  He denies any recent  chest pain.  Reports chest pain weeks ago.  Enzymes negative. Cardiology has been consulted due to history of chest pain and new diagnosis of cardiomyopathy with low ejection fraction. Underwent cardiac catheterization nonobstructive cardiomyopathy.  alcohol use disorder/alcohol withdrawal: He reports drinking about half a  paint of liquor a day. ETT from 5/12 to 5/16. Continue with multivitamins, thiamine and folic acid. He was treated with Precedex in the ICU.  Thrombocytopenia: Likely related to alcohol use monitor.  Severe protein caloric malnutrition.  Likely due to acute on chronic illness, alcohol use disorder.  Appreciate nutrition's input  Delirium: Impulsive and disoriented intermittently resolved   hypoKalemia/hypomagnesemia: Replaced.  Patient was seen by physical therapy, who recommended no PT follow up needed. assistance was provided to the pt so that he can afford his medication and his follow ups are arranged. On the day of the discharge the patient's vitals were stable , and no other acute medical condition were reported by patient. the patient was felt safe to be discharge at home with family.  Consultants: cardiology PCCM  Procedures: Echocardiogram  Cardiac Catheterization  DISCHARGE MEDICATION: Allergies as of 08/12/2018      Reactions   Doxycycline Rash   Rash noted after administration of vancomycin, doxycycline, and ceftriaxone. Unclear cause of rash.   Ibuprofen Rash   Rocephin [ceftriaxone] Rash   Rash noted after administration of vancomycin, doxycycline, and ceftriaxone. Unclear cause of rash.   Tylenol [acetaminophen] Rash   Vancomycin Rash   Rash noted after administration of vancomycin, doxycycline, and ceftriaxone. Unclear cause of rash.      Medication List    STOP taking these medications   doxycycline 100 MG capsule Commonly known as:  VIBRAMYCIN   GOODY HEADACHE PO   potassium chloride 10 MEQ tablet Commonly known as:  K-DUR   predniSONE 10 MG tablet Commonly known as:  DELTASONE     TAKE these medications   aspirin EC 81 MG tablet Take 1 tablet (81 mg total) by mouth daily.   carvedilol 3.125 MG tablet Commonly known as:  COREG Take 1 tablet (3.125 mg total) by mouth 2 (two) times daily with a meal.   feeding supplement (ENSURE ENLIVE) Liqd  Take 237 mLs by mouth 3 (three) times daily between meals.   folic acid 1 MG tablet Commonly known as:  FOLVITE Take 1 tablet (1 mg total) by mouth daily.   nicotine 21 mg/24hr patch Commonly known as:  NICODERM CQ - dosed in mg/24 hours Place 1 patch (21 mg total) onto the skin daily.   rosuvastatin 10 MG tablet Commonly known as:  CRESTOR Take 1 tablet (10 mg total) by mouth daily at 6 PM.   sacubitril-valsartan 24-26 MG Commonly known as:  ENTRESTO Take 1 tablet by mouth 2 (two) times daily.   thiamine 100 MG tablet Take 1 tablet (100 mg total) by mouth daily.      Allergies  Allergen Reactions  . Doxycycline Rash    Rash noted after administration of vancomycin, doxycycline, and ceftriaxone. Unclear cause of rash.  . Ibuprofen Rash  . Rocephin [Ceftriaxone] Rash    Rash noted after administration of vancomycin, doxycycline, and ceftriaxone. Unclear cause of rash.  . Tylenol [Acetaminophen] Rash  . Vancomycin Rash    Rash noted after administration of vancomycin, doxycycline, and ceftriaxone. Unclear cause of rash.   Discharge Instructions    Diet - low sodium heart healthy   Complete by:  As directed    Discharge instructions  Complete by:  As directed    It is important that you read the given instructions as well as go over your medication list with RN to help you understand your care after this hospitalization.  Discharge Instructions: Please follow-up with PCP in 1-2 weeks  Please request your primary care physician to go over all Hospital Tests and Procedure/Radiological results at the follow up. Please get all Hospital records sent to your PCP by signing hospital release before you go home.   Do not drive, operating heavy machinery, perform activities at heights, swimming or participation in water activities or provide baby sitting services; until you have been seen by Primary Care Physician or a Neurologist and advised to do so again. Do not take more than  prescribed Pain, Sleep and Anxiety Medications. You were cared for by a hospitalist during your hospital stay. If you have any questions about your discharge medications or the care you received while you were in the hospital after you are discharged, you can call the unit @UNIT @ you were admitted to and ask to speak with the hospitalist on call if the hospitalist that took care of you is not available.  Once you are discharged, your primary care physician will handle any further medical issues. Please note that NO REFILLS for any discharge medications will be authorized once you are discharged, as it is imperative that you return to your primary care physician (or establish a relationship with a primary care physician if you do not have one) for your aftercare needs so that they can reassess your need for medications and monitor your lab values. You Must read complete instructions/literature along with all the possible adverse reactions/side effects for all the Medicines you take and that have been prescribed to you. Take any new Medicines after you have completely understood and accept all the possible adverse reactions/side effects. Wear Seat belts while driving. If you have smoked or chewed Tobacco in the last 2 yrs please stop smoking and/or stop any Recreational drug use.  If you drink alcohol, please STOP the use and do not drive, operating heavy machinery, perform activities at heights, swimming or participation in water activities or provide baby sitting services under influence.   Increase activity slowly   Complete by:  As directed      Discharge Exam: Filed Weights   08/10/18 0500 08/11/18 0500 08/12/18 0300  Weight: 57.7 kg 55.1 kg 56.4 kg   Vitals:   08/12/18 0826 08/12/18 0828  BP: (!) 89/59 (!) 87/59  Pulse: 88   Resp: 18   Temp: 98.7 F (37.1 C)   SpO2: 100%    General: Appear in no distress, no Rash; Oral Mucosa moist. Cardiovascular: S1 and S2 Present, no Murmur, no JVD  Respiratory: Bilateral Air entry present and Clear to Auscultation, no Crackles, no wheezes Abdomen: Bowel Sound present, Soft and no tenderness Extremities: no Pedal edema, no calf tenderness Neurology: Grossly no focal neuro deficit.  The results of significant diagnostics from this hospitalization (including imaging, microbiology, ancillary and laboratory) are listed below for reference.    Significant Diagnostic Studies: Dg Chest 1 View  Result Date: 07/30/2018 CLINICAL DATA:  58 year old male with COVID-19 status pending. Intubated. EXAM: CHEST  1 VIEW COMPARISON:  0413 hours today and earlier. FINDINGS: Portable AP semi upright view at 0904 hours. Endotracheal tube tip in good position between the level the clavicles and carina. The patient is mildly rotated to the right. Continued confluent right mid and lower lung consolidation.  No pneumothorax or pulmonary edema. The left lung remains clear. Stable cardiac size and mediastinal contours. IMPRESSION: 1. Endotracheal tube tip in good position. 2. Continued confluent right mid and lower lung consolidation sen earlier today. Electronically Signed   By: Genevie Ann M.D.   On: 07/30/2018 09:42   Ct Angio Chest Pe W And/or Wo Contrast  Result Date: 07/28/2018 CLINICAL DATA:  Shortness of breath, evaluate for PE EXAM: CT ANGIOGRAPHY CHEST WITH CONTRAST TECHNIQUE: Multidetector CT imaging of the chest was performed using the standard protocol during bolus administration of intravenous contrast. Multiplanar CT image reconstructions and MIPs were obtained to evaluate the vascular anatomy. CONTRAST:  120mL OMNIPAQUE IOHEXOL 350 MG/ML SOLN COMPARISON:  12/24/2014 FINDINGS: Cardiovascular: Satisfactory opacification of the pulmonary arteries to the segmental level. No evidence of pulmonary embolism. Scattered coronary artery calcifications. Normal heart size. No pericardial effusion. Mediastinum/Nodes: No enlarged mediastinal, hilar, or axillary lymph nodes.  Thyroid gland, trachea, and esophagus demonstrate no significant findings. Lungs/Pleura: Moderate centrilobular emphysema. There is clustered centrilobular and ground-glass opacity of the right upper lobe (series 7, image 76) and of the superior segment left lower lobe (series 7, image 82). Diffuse bilateral bronchial wall thickening. No pleural effusion or pneumothorax. Upper Abdomen: No acute abnormality. Musculoskeletal: No chest wall abnormality. No acute or significant osseous findings. Review of the MIP images confirms the above findings. IMPRESSION: 1.  Negative examination for pulmonary embolism. 2. There is clustered centrilobular and ground-glass opacity of the right upper lobe (series 7, image 76) and of the superior segment left lower lobe (series 7, image 82), consistent with multifocal infection. 3. Diffuse bilateral nonspecific infectious or inflammatory bronchial wall thickening. 4.  Emphysema. 5.  Coronary artery disease. Electronically Signed   By: Eddie Candle M.D.   On: 07/28/2018 21:58   Dg Chest Port 1 View  Result Date: 08/03/2018 CLINICAL DATA:  Respiratory failure EXAM: PORTABLE CHEST 1 VIEW COMPARISON:  Aug 02, 2018 FINDINGS: The ETT terminates in the mid trachea. The NG tube terminates below today's film. The cardiomediastinal silhouette is stable. Opacity in the right lung is stable, likely a combination of layering effusion and underlying infiltrate. More mild opacity in left base is stable as well. No other interval changes. IMPRESSION: 1. Stable support apparatus as above. 2. Bilateral pulmonary opacities, right greater than left, persist, unchanged. The opacity on the right is likely a combination of pleural fluid layering with underlying parenchymal opacity which could represent atelectasis or pneumonia. Electronically Signed   By: Dorise Bullion III M.D   On: 08/03/2018 08:10   Dg Chest Port 1 View  Result Date: 08/02/2018 CLINICAL DATA:  Respiratory failure EXAM: PORTABLE  CHEST 1 VIEW COMPARISON:  08/01/2018 FINDINGS: Cardiac shadow is stable. Endotracheal tube and gastric catheter are seen in satisfactory position. Increasing opacity in the right lung is noted likely related to a combination of infiltrate and posterior effusion. New patchy density in the left base is noted. No bony abnormality is seen. IMPRESSION: Bilateral airspace opacities increasing when compared with the prior exam right greater than left. Tubes and lines as described. Electronically Signed   By: Inez Catalina M.D.   On: 08/02/2018 07:59   Dg Chest Port 1 View  Result Date: 08/01/2018 CLINICAL DATA:  Respiratory failure EXAM: PORTABLE CHEST 1 VIEW COMPARISON:  07/31/2018 FINDINGS: Cardiac shadow is stable. Endotracheal tube and gastric catheter are again seen and stable. Increasing confluent density in the right mid and lower lung is again noted. Small right-sided pleural  effusion is seen. Mild interstitial edema is noted as well. No pneumothorax is noted. Left lung remains clear. IMPRESSION: Increasing opacity in the right lung. Electronically Signed   By: Inez Catalina M.D.   On: 08/01/2018 07:56   Dg Chest Port 1 View  Result Date: 07/31/2018 CLINICAL DATA:  58 year old male with respiratory failure. Negative for COVID-19. EXAM: PORTABLE CHEST 1 VIEW COMPARISON:  07/30/2018 and earlier. FINDINGS: Portable AP semi upright view at 0529 hours. Stable endotracheal tube. Enteric tube has been placed and courses to the abdomen, tip not included. Regressed but not resolved confluent right mid and lower lung opacity since 0403 hours yesterday. Stable lung volumes. Stable cardiac size and mediastinal contours. No pneumothorax. No pleural effusion is evident. The left lung remains clear. IMPRESSION: 1. Stable ETT.  Enteric tube placed. 2. Regressed but not resolved confluent right mid and lower lung opacity since yesterday. 3. No new cardiopulmonary abnormality. Electronically Signed   By: Genevie Ann M.D.   On:  07/31/2018 07:44   Dg Chest Port 1 View  Result Date: 07/30/2018 CLINICAL DATA:  58 year old male with hypoxia. EXAM: PORTABLE CHEST 1 VIEW COMPARISON:  CTA chest 07/28/2018 and earlier. FINDINGS: Portable AP upright view at 0403 hours new large area of confluent opacity and consolidation in the right mid and lower lung since 07/28/2018. No superimposed pneumothorax. No pleural effusion is evident. The left lung remain stable. Stable cardiac size and mediastinal contours. Visualized tracheal air column is within normal limits. Stable visualized osseous structures. Negative visible bowel gas pattern. IMPRESSION: Large right lung multilobar pneumonia and/or aspiration is new since 07/28/2018. These results will be called to the ordering clinician or representative by the Radiologist Assistant, and communication documented in the PACS or zVision Dashboard. Electronically Signed   By: Genevie Ann M.D.   On: 07/30/2018 04:48   Dg Chest Portable 1 View  Result Date: 07/28/2018 CLINICAL DATA:  Shortness of breath and cough EXAM: PORTABLE CHEST 1 VIEW COMPARISON:  06/01/2018 FINDINGS: Cardiac shadow is stable. Aortic calcifications are again seen. The lungs are well aerated bilaterally. No acute infiltrate is seen. Old rib fractures are noted on the left. IMPRESSION: No active disease. Electronically Signed   By: Inez Catalina M.D.   On: 07/28/2018 20:34   Dg Abd Portable 1v  Result Date: 07/30/2018 CLINICAL DATA:  Orogastric tube placement EXAM: PORTABLE ABDOMEN - 1 VIEW COMPARISON:  CT abdomen and pelvis December 24, 2014 FINDINGS: Orogastric tube tip and side port are in the stomach. There is no bowel dilatation or air-fluid level to suggest bowel obstruction. No free air. Liver appears enlarged. There is a right pleural effusion with ill-defined airspace opacity in the visualized right lung regions. There old healed rib fractures on the left. IMPRESSION: Orogastric tube tip and side port in stomach. No bowel  obstruction or free air evident. Liver appears enlarged. Right pleural effusion with airspace opacity concerning for a degree of pneumonia in right lower lung region. Electronically Signed   By: Lowella Grip III M.D.   On: 07/30/2018 15:02    Microbiology: No results found for this or any previous visit (from the past 240 hour(s)).   Labs: CBC: Recent Labs  Lab 08/09/18 2012 08/10/18 0541 08/11/18 0454  WBC 11.0* 9.7 10.4  NEUTROABS  --  7.4 7.5  HGB 11.0* 10.3* 10.6*  HCT 32.5* 31.1* 31.6*  MCV 109.1* 109.1* 109.3*  PLT 168 176 425   Basic Metabolic Panel: Recent Labs  Lab 08/09/18 2012  08/10/18 0541 08/11/18 0454  NA  --  137 136  K  --  4.5 4.5  CL  --  104 103  CO2  --  23 23  GLUCOSE  --  113* 90  BUN  --  13 13  CREATININE 0.85 0.79 0.79  CALCIUM  --  8.6* 8.8*  MG  --  2.0  --    Liver Function Tests: No results for input(s): AST, ALT, ALKPHOS, BILITOT, PROT, ALBUMIN in the last 168 hours. No results for input(s): LIPASE, AMYLASE in the last 168 hours. No results for input(s): AMMONIA in the last 168 hours. Cardiac Enzymes: No results for input(s): CKTOTAL, CKMB, CKMBINDEX, TROPONINI in the last 168 hours. BNP (last 3 results) Recent Labs    06/01/18 1800 07/28/18 2012  BNP 428.8* 412.6*   CBG: Recent Labs  Lab 08/09/18 1627 08/09/18 2108 08/10/18 0741 08/10/18 1119 08/10/18 1655  GLUCAP 90 101* 98 93 108*   Time spent: 35 minutes  Signed:  Berle Mull  [facility] 08/12/2018

## 2018-08-23 ENCOUNTER — Encounter: Payer: Self-pay | Admitting: Physician Assistant

## 2018-08-23 NOTE — Progress Notes (Deleted)
Cardiology Office Note    Date:  08/23/2018  ID:  Trevor Williams, DOB 13-Jan-1961, MRN [id number] PCP:  Marliss Coots, NP  Cardiologist:  Dr. Aundra Dubin (advanced HF)   Chief Complaint: f/u CHF  History of Present Illness:  Trevor Williams is a 58 y.o. homeless male with history of alcohol abuse, cirrhosis, tobacco abuse, presumed COPD, recent diagnosis of biventricular CHF/NICM and mild-moderate MR, abnormal LFTs, macrocytic anemia, malnutrition who presents for post-hospital follow-up. In 07/2018 he was admitted with hypoxic respiratory failure and pneumonia and found to have new acute systolic congestive heart failure. 2D echo 08/04/18 showed EF 15-20%, moderately enlarged RV with severely reduced RVEF, mild LAE/RAE, degenerative mitral valve with mild-moderate MR, moderate TR, trivial perciardial effusion, grade 2 DD.  During his admission he required intubation and pressors. He was gently diuresed. Cardiac catheterization 08/09/18 revealed nonobstructive coronary disease (50% ostial ramus) and low filling pressures with preserved cardiac output. Other diagnoses include intermittent delirium, severe protein calorie malnutrition, thrombocytopenia, macrocytic anemia, and presumed COPD exacerbation. Last labs 08/11/18 showed Hgb 10.6, K 4.5, Cr 0.79, LDL 45, Mg 2.0, albumin 2.8, AST 78, ALT 129, TBili 1.4.   Chronic combined CHF/NICM Mild CAD Alcohol abuse Mitral regurgitation    Past Medical History:  Diagnosis Date  . Abnormal liver function tests   . Acute respiratory failure ([place])    a. 07/2018 requiring intubation - CAP/CHF.  Marland Kitchen Alcohol abuse   . Biventricular heart failure ([place])   . Cancer ([place])    skin (left hand)  . Delirium    a. h/o delirium while admitted  . Emphysema of lung ([place])   . Macrocytic anemia   . Mild CAD    a. cath 07/2018 60% ostial ramus otherwise OK.  . Mitral regurgitation   . NICM (nonischemic cardiomyopathy) ([place])   . Protein calorie malnutrition ([place])   .  Tricuspid regurgitation     Past Surgical History:  Procedure Laterality Date  . RIGHT/LEFT HEART CATH AND CORONARY ANGIOGRAPHY N/A 08/09/2018   Procedure: RIGHT/LEFT HEART CATH AND CORONARY ANGIOGRAPHY;  Surgeon: Larey Dresser, MD;  Location: [facility];  Service: Cardiovascular;  Laterality: N/A;    Current Medications: No outpatient medications have been marked as taking for the 08/26/18 encounter (Appointment) with Charlie Pitter, PA-C.   ***   Allergies:   Doxycycline; Ibuprofen; Rocephin [ceftriaxone]; Tylenol [acetaminophen]; and Vancomycin   Social History   Socioeconomic History  . Marital status: Single    Spouse name: Not on file  . Number of children: Not on file  . Years of education: Not on file  . Highest education level: Not on file  Occupational History  . Not on file  Social Needs  . Financial resource strain: Not on file  . Food insecurity:    Worry: Not on file    Inability: Not on file  . Transportation needs:    Medical: Not on file    Non-medical: Not on file  Tobacco Use  . Smoking status: Current Every Day Smoker    Packs/day: 1.00    Types: Cigarettes  . Smokeless tobacco: Current User  Substance and Sexual Activity  . Alcohol use: Yes    Comment: Liquor daily.  . Drug use: No  . Sexual activity: Not on file  Lifestyle  . Physical activity:    Days per week: Not on file    Minutes per session: Not on file  . Stress: Not on file  Relationships  .  Social connections:    Talks on phone: Not on file    Gets together: Not on file    Attends religious service: Not on file    Active member of club or organization: Not on file    Attends meetings of clubs or organizations: Not on file    Relationship status: Not on file  Other Topics Concern  . Not on file  Social History Narrative  . Not on file     Family History:  The patient's family history includes Hypertension in his maternal grandfather.  ROS:   Please see the history  of present illness. Otherwise, review of systems is positive for ***.  All other systems are reviewed and otherwise negative.    PHYSICAL EXAM:   VS:  There were no vitals taken for this visit.  BMI: There is no height or weight on file to calculate BMI. GEN: Well nourished, well developed, in no acute distress HEENT: normocephalic, atraumatic Neck: no JVD, carotid bruits, or masses Cardiac: ***RRR; no murmurs, rubs, or gallops, no edema  Respiratory:  clear to auscultation bilaterally, normal work of breathing GI: soft, nontender, nondistended, + BS MS: no deformity or atrophy Skin: warm and dry, no rash Neuro:  Alert and Oriented x 3, Strength and sensation are intact, follows commands Psych: euthymic mood, full affect  Wt Readings from Last 3 Encounters:  08/12/18 124 lb 5.4 oz (56.4 kg)  03/04/18 135 lb (61.2 kg)  01/01/17 125 lb (56.7 kg)      Studies/Labs Reviewed:   EKG:  EKG was ordered today and personally reviewed by me and demonstrates *** EKG was not ordered today.***  Recent Labs: 07/28/2018: B Natriuretic Peptide 412.6 08/09/2018: ALT 129 08/10/2018: Magnesium 2.0 08/11/2018: BUN 13; Creatinine, Ser 0.79; Hemoglobin 10.6; Platelets 190; Potassium 4.5; Sodium 136   Lipid Panel    Component Value Date/Time   CHOL 87 08/10/2018 0541   TRIG 80 08/10/2018 0541   HDL 26 (L) 08/10/2018 0541   CHOLHDL 3.3 08/10/2018 0541   VLDL 16 08/10/2018 0541   LDLCALC 45 08/10/2018 0541    Additional studies/ records that were reviewed today include: Summarized above   ASSESSMENT & PLAN:   1. ***  Disposition: F/u with ***   Medication Adjustments/Labs and Tests Ordered: Current medicines are reviewed at length with the patient today.  Concerns regarding medicines are outlined above. Medication changes, Labs and Tests ordered today are summarized above and listed in the Patient Instructions accessible in Encounters.   Signed, Charlie Pitter, PA-C  08/23/2018 7:55 PM     [facility] [address], [place], [place]  [address] Phone: [phone]; Fax: [phone]

## 2018-08-26 ENCOUNTER — Ambulatory Visit: Payer: Self-pay | Admitting: Physician Assistant

## 2018-08-26 ENCOUNTER — Other Ambulatory Visit: Payer: Self-pay

## 2018-09-11 ENCOUNTER — Inpatient Hospital Stay (HOSPITAL_COMMUNITY): Payer: Self-pay

## 2018-09-27 ENCOUNTER — Encounter (HOSPITAL_COMMUNITY): Payer: Self-pay | Admitting: *Deleted

## 2018-11-02 ENCOUNTER — Emergency Department (HOSPITAL_BASED_OUTPATIENT_CLINIC_OR_DEPARTMENT_OTHER)
Admission: EM | Admit: 2018-11-02 | Discharge: 2018-11-02 | Disposition: A | Discharge status: Home / Self Care | Payer: Medicaid Other | Attending: Emergency Medicine | Admitting: Emergency Medicine

## 2018-11-02 ENCOUNTER — Other Ambulatory Visit: Payer: Self-pay

## 2018-11-02 DIAGNOSIS — R2231 Localized swelling, mass and lump, right upper limb: Secondary | ICD-10-CM | POA: Diagnosis not present

## 2018-11-02 DIAGNOSIS — Z7982 Long term (current) use of aspirin: Secondary | ICD-10-CM | POA: Insufficient documentation

## 2018-11-02 DIAGNOSIS — F17210 Nicotine dependence, cigarettes, uncomplicated: Secondary | ICD-10-CM | POA: Insufficient documentation

## 2018-11-02 DIAGNOSIS — F17228 Nicotine dependence, chewing tobacco, with other nicotine-induced disorders: Secondary | ICD-10-CM | POA: Diagnosis not present

## 2018-11-02 DIAGNOSIS — Y939 Activity, unspecified: Secondary | ICD-10-CM | POA: Diagnosis not present

## 2018-11-02 DIAGNOSIS — Z590 Homelessness: Secondary | ICD-10-CM | POA: Insufficient documentation

## 2018-11-02 DIAGNOSIS — M25521 Pain in right elbow: Secondary | ICD-10-CM | POA: Diagnosis present

## 2018-11-02 DIAGNOSIS — Z85828 Personal history of other malignant neoplasm of skin: Secondary | ICD-10-CM | POA: Insufficient documentation

## 2018-11-02 DIAGNOSIS — M7021 Olecranon bursitis, right elbow: Secondary | ICD-10-CM | POA: Insufficient documentation

## 2018-11-02 DIAGNOSIS — Z79899 Other long term (current) drug therapy: Secondary | ICD-10-CM | POA: Diagnosis not present

## 2018-11-02 MED ORDER — DICLOFENAC SODIUM 1 % TD GEL: 2.0000 g | Freq: Four times a day (QID) | TRANSDERMAL | 0 refills | Status: DC

## 2018-11-02 NOTE — Discharge Instructions (Signed)
You have an olecranon bursitis, or inflammation of the bursa of your right elbow.  Apply voltaren gel 4 times daily to affected area for pain control.  Keep elbow wrapped.  Follow up with orthopedist specialist as needed.

## 2018-11-02 NOTE — ED Triage Notes (Signed)
Patient states that he is having pain to his right elbow pain and swell and right knee pain and swelling " for a long time" - he states that he " takes a drink of liquor and I keeps on going' - patient reports that he came in today because now his elbow is starting to hurt
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
## 2018-11-02 NOTE — ED Notes (Signed)
ED Provider at bedside. 

## 2018-11-02 NOTE — ED Notes (Signed)
Patient is not taking any of the medications that is on his medication list

## 2018-12-10 ENCOUNTER — Emergency Department (HOSPITAL_BASED_OUTPATIENT_CLINIC_OR_DEPARTMENT_OTHER)
Admission: EM | Admit: 2018-12-10 | Discharge: 2018-12-10 | Disposition: A | Discharge status: Home / Self Care | Payer: Medicaid Other | Attending: Emergency Medicine | Admitting: Emergency Medicine

## 2018-12-10 ENCOUNTER — Other Ambulatory Visit: Payer: Self-pay

## 2018-12-10 ENCOUNTER — Emergency Department (HOSPITAL_BASED_OUTPATIENT_CLINIC_OR_DEPARTMENT_OTHER): Payer: Medicaid Other

## 2018-12-10 ENCOUNTER — Encounter (HOSPITAL_BASED_OUTPATIENT_CLINIC_OR_DEPARTMENT_OTHER): Payer: Self-pay | Admitting: *Deleted

## 2018-12-10 DIAGNOSIS — Z79899 Other long term (current) drug therapy: Secondary | ICD-10-CM | POA: Diagnosis not present

## 2018-12-10 DIAGNOSIS — R21 Rash and other nonspecific skin eruption: Secondary | ICD-10-CM | POA: Diagnosis not present

## 2018-12-10 DIAGNOSIS — Z590 Homelessness: Secondary | ICD-10-CM | POA: Diagnosis not present

## 2018-12-10 DIAGNOSIS — Z7982 Long term (current) use of aspirin: Secondary | ICD-10-CM | POA: Insufficient documentation

## 2018-12-10 DIAGNOSIS — J441 Chronic obstructive pulmonary disease with (acute) exacerbation: Secondary | ICD-10-CM | POA: Diagnosis not present

## 2018-12-10 DIAGNOSIS — F17210 Nicotine dependence, cigarettes, uncomplicated: Secondary | ICD-10-CM | POA: Insufficient documentation

## 2018-12-10 DIAGNOSIS — Z85828 Personal history of other malignant neoplasm of skin: Secondary | ICD-10-CM | POA: Diagnosis not present

## 2018-12-10 DIAGNOSIS — R0602 Shortness of breath: Secondary | ICD-10-CM | POA: Diagnosis not present

## 2018-12-10 LAB — CBC WITH DIFFERENTIAL/PLATELET
Abs Immature Granulocytes: 0.03 10*3/uL (ref 0.00–0.07)
Basophils Absolute: 0.1 10*3/uL (ref 0.0–0.1)
Basophils Relative: 2 %
Eosinophils Absolute: 0.1 10*3/uL (ref 0.0–0.5)
Eosinophils Relative: 3 %
HCT: 33.9 % — ABNORMAL LOW (ref 39.0–52.0)
Hemoglobin: 11.5 g/dL — ABNORMAL LOW (ref 13.0–17.0)
Immature Granulocytes: 1 %
Lymphocytes Relative: 41 %
Lymphs Abs: 1.6 10*3/uL (ref 0.7–4.0)
MCH: 36.7 pg — ABNORMAL HIGH (ref 26.0–34.0)
MCHC: 33.9 g/dL (ref 30.0–36.0)
MCV: 108.3 fL — ABNORMAL HIGH (ref 80.0–100.0)
Monocytes Absolute: 0.4 10*3/uL (ref 0.1–1.0)
Monocytes Relative: 10 %
Neutro Abs: 1.7 10*3/uL (ref 1.7–7.7)
Neutrophils Relative %: 43 %
Platelets: 61 10*3/uL — ABNORMAL LOW (ref 150–400)
RBC: 3.13 MIL/uL — ABNORMAL LOW (ref 4.22–5.81)
RDW: 12.9 % (ref 11.5–15.5)
Smear Review: DECREASED
WBC: 3.9 10*3/uL — ABNORMAL LOW (ref 4.0–10.5)
nRBC: 0 % (ref 0.0–0.2)

## 2018-12-10 LAB — COMPREHENSIVE METABOLIC PANEL
ALT: 78 U/L — ABNORMAL HIGH (ref 0–44)
AST: 181 U/L — ABNORMAL HIGH (ref 15–41)
Albumin: 3.9 g/dL (ref 3.5–5.0)
Alkaline Phosphatase: 95 U/L (ref 38–126)
Anion gap: 13 (ref 5–15)
BUN: 6 mg/dL (ref 6–20)
CO2: 26 mmol/L (ref 22–32)
Calcium: 9 mg/dL (ref 8.9–10.3)
Chloride: 105 mmol/L (ref 98–111)
Creatinine, Ser: 0.51 mg/dL — ABNORMAL LOW (ref 0.61–1.24)
GFR calc Af Amer: >60 mL/min (ref >60)
GFR calc non Af Amer: >60 mL/min (ref >60)
Glucose, Bld: 89 mg/dL (ref 70–99)
Potassium: 3.2 mmol/L — ABNORMAL LOW (ref 3.5–5.1)
Sodium: 144 mmol/L (ref 135–145)
Total Bilirubin: 0.5 mg/dL (ref 0.3–1.2)
Total Protein: 7.2 g/dL (ref 6.5–8.1)

## 2018-12-10 MED ORDER — ALBUTEROL SULFATE HFA 108 (90 BASE) MCG/ACT IN AERS
2.0000 | INHALATION_SPRAY | Freq: Four times a day (QID) | RESPIRATORY_TRACT | Status: DC
  Administered 2018-12-10: 18:00:00 2 via RESPIRATORY_TRACT
  Filled 2018-12-10: qty 6.7

## 2018-12-10 MED ORDER — PREDNISONE 10 MG PO TABS: 40.0000 mg | ORAL_TABLET | Freq: Every day | ORAL | 0 refills | Status: DC

## 2018-12-10 MED ORDER — PREDNISONE 50 MG PO TABS
60.0000 mg | ORAL_TABLET | Freq: Once | ORAL | Status: AC
  Administered 2018-12-10: 60 mg via ORAL
  Filled 2018-12-10: qty 1

## 2018-12-10 NOTE — ED Notes (Signed)
Potable X-ray at bedside

## 2018-12-10 NOTE — Discharge Instructions (Addendum)
Use the albuterol inhaler 2 puffs every 6 hours at least for the next 7 days.  But then as needed.  Take the prednisone for the next 5 days.  This will both help the rash as well as the shortness of breath.  Return for any new or worse symptoms.  Chest x-ray here today without evidence of pneumonia or any fluid on the lungs.

## 2018-12-10 NOTE — ED Provider Notes (Signed)
[facility] EMERGENCY DEPARTMENT Provider Note   CSN: [id number] Arrival date & time: 12/10/18  1357     History   Chief Complaint Chief Complaint  Patient presents with  . Shortness of Breath    HPI Trevor Williams is a 58 y.o. male.     Patient with complaint of shortness of breath for 2 days.  Also describing some body aches.  Patient states he is currently homeless.  Oxygen saturations here 100%.  Also talking about a long-term rash predominantly on his hands and legs and chest.  Which is itchy.  That is been present for a long time.  Patient also has a known history of emphysema COPD.  Currently does not have an albuterol inhaler.  Currently not on steroids.  Patient denies any fevers.  Denies any cough.     Past Medical History:  Diagnosis Date  . Abnormal liver function tests   . Acute respiratory failure ([place])    a. 07/2018 requiring intubation - CAP/CHF.  Marland Kitchen Alcohol abuse   . Biventricular heart failure ([place])   . Cancer ([place])    skin (left hand)  . Delirium    a. h/o delirium while admitted  . Emphysema of lung ([place])   . Homelessness   . Macrocytic anemia   . Mild CAD    a. cath 07/2018 60% ostial ramus otherwise OK.  . Mitral regurgitation   . NICM (nonischemic cardiomyopathy) ([place])   . Protein calorie malnutrition ([place])   . Tricuspid regurgitation     Patient Active Problem List   Diagnosis Date Noted  . Acute respiratory failure with hypoxia ([place]) 07/29/2018  . Community acquired pneumonia 07/29/2018    Past Surgical History:  Procedure Laterality Date  . RIGHT/LEFT HEART CATH AND CORONARY ANGIOGRAPHY N/A 08/09/2018   Procedure: RIGHT/LEFT HEART CATH AND CORONARY ANGIOGRAPHY;  Surgeon: Larey Dresser, MD;  Location: [facility];  Service: Cardiovascular;  Laterality: N/A;        Home Medications    Prior to Admission medications   Medication Sig Start Date End Date Taking? Authorizing Provider  aspirin EC 81 MG tablet Take 1  tablet (81 mg total) by mouth daily. 08/12/18 08/12/19  Lavina Hamman, MD  carvedilol (COREG) 3.125 MG tablet Take 1 tablet (3.125 mg total) by mouth 2 (two) times daily with a meal. 08/12/18   Lavina Hamman, MD  diclofenac sodium (VOLTAREN) 1 % GEL Apply 2 g topically 4 (four) times daily. 11/02/18   Domenic Moras, PA-C  feeding supplement, ENSURE ENLIVE, (ENSURE ENLIVE) LIQD Take 237 mLs by mouth 3 (three) times daily between meals. 08/12/18   Lavina Hamman, MD  folic acid (FOLVITE) 1 MG tablet Take 1 tablet (1 mg total) by mouth daily. 08/12/18   Lavina Hamman, MD  nicotine (NICODERM CQ - DOSED IN MG/24 HOURS) 21 mg/24hr patch Place 1 patch (21 mg total) onto the skin daily. 08/12/18   Lavina Hamman, MD  predniSONE (DELTASONE) 10 MG tablet Take 4 tablets (40 mg total) by mouth daily. 12/10/18   Fredia Sorrow, MD  rosuvastatin (CRESTOR) 10 MG tablet Take 1 tablet (10 mg total) by mouth daily at 6 PM. 08/12/18   Lavina Hamman, MD  sacubitril-valsartan (ENTRESTO) 24-26 MG Take 1 tablet by mouth 2 (two) times daily. 08/12/18   Lavina Hamman, MD  thiamine 100 MG tablet Take 1 tablet (100 mg total) by mouth daily. 08/12/18   Lavina Hamman, MD  Family History Family History  Problem Relation Age of Onset  . Hypertension Maternal Grandfather     Social History Social History   Tobacco Use  . Smoking status: Current Every Day Smoker    Packs/day: 1.00    Types: Cigarettes  . Smokeless tobacco: Current User  Substance Use Topics  . Alcohol use: Yes    Comment: Liquor daily.  . Drug use: No     Allergies   Doxycycline, Ibuprofen, Rocephin [ceftriaxone], Tylenol [acetaminophen], and Vancomycin   Review of Systems Review of Systems  Constitutional: Negative for chills and fever.  HENT: Negative for congestion, rhinorrhea and sore throat.   Eyes: Negative for visual disturbance.  Respiratory: Positive for shortness of breath. Negative for cough.   Cardiovascular: Negative for  chest pain and leg swelling.  Gastrointestinal: Negative for abdominal pain, diarrhea, nausea and vomiting.  Genitourinary: Negative for dysuria.  Musculoskeletal: Positive for myalgias. Negative for back pain and neck pain.  Skin: Positive for rash.  Neurological: Negative for dizziness, light-headedness and headaches.  Hematological: Does not bruise/bleed easily.  Psychiatric/Behavioral: Negative for confusion.     Physical Exam Updated Vital Signs BP 111/82   Pulse 93   Temp 97.9 F (36.6 C) (Oral)   Resp 16   Ht 1.702 m (5\' 7" )   Wt 59 kg   SpO2 100%   BMI 20.36 kg/m   Physical Exam Vitals signs and nursing note reviewed.  Constitutional:      General: He is not in acute distress.    Appearance: Normal appearance. He is well-developed. He is not ill-appearing or toxic-appearing.  HENT:     Head: Normocephalic and atraumatic.  Eyes:     Extraocular Movements: Extraocular movements intact.     Conjunctiva/sclera: Conjunctivae normal.     Pupils: Pupils are equal, round, and reactive to light.  Neck:     Musculoskeletal: Normal range of motion and neck supple.  Cardiovascular:     Rate and Rhythm: Normal rate and regular rhythm.     Heart sounds: No murmur.  Pulmonary:     Effort: Pulmonary effort is normal. No respiratory distress.     Breath sounds: Wheezing present.  Abdominal:     Palpations: Abdomen is soft.     Tenderness: There is no abdominal tenderness.  Musculoskeletal: Normal range of motion.        General: No swelling.  Skin:    General: Skin is warm and dry.     Findings: Rash present.     Comments: Patient with nonvesicular papules.  Also some excoriation of the skin.  With some scaling.  No vesicles no open wounds.  The rashes abdominally on the dorsum of the hands.  Anterior chest and legs.  Neurological:     General: No focal deficit present.     Mental Status: He is alert and oriented to person, place, and time.      ED Treatments /  Results  Labs (all labs ordered are listed, but only abnormal results are displayed) Labs Reviewed  CBC WITH DIFFERENTIAL/PLATELET - Abnormal; Notable for the following components:      Result Value   WBC 3.9 (*)    RBC 3.13 (*)    Hemoglobin 11.5 (*)    HCT 33.9 (*)    MCV 108.3 (*)    MCH 36.7 (*)    Platelets 61 (*)    All other components within normal limits  COMPREHENSIVE METABOLIC PANEL - Abnormal; Notable for the following  components:   Potassium 3.2 (*)    Creatinine, Ser 0.51 (*)    AST 181 (*)    ALT 78 (*)    All other components within normal limits    EKG EKG Interpretation  Date/Time:  Tuesday December 10 2018 14:04:08 EDT Ventricular Rate:  86 PR Interval:  146 QRS Duration: 82 QT Interval:  404 QTC Calculation: 483 R Axis:   68 Text Interpretation:  Normal sinus rhythm Moderate voltage criteria for LVH, may be normal variant Nonspecific T wave abnormality Prolonged QT Abnormal ECG similar to prior 5/20 Confirmed by Aletta Edouard [phone]) on 12/10/2018 2:07:59 PM   Radiology Dg Chest Port 1 View  Result Date: 12/10/2018 CLINICAL DATA:  Shortness of breath EXAM: PORTABLE CHEST 1 VIEW COMPARISON:  Aug 03, 2018 FINDINGS: There is no evident edema or consolidation. Heart size and pulmonary vascularity are normal. No adenopathy. No pneumothorax. No bone lesions. IMPRESSION: No edema or consolidation. Electronically Signed   By: Lowella Grip III M.D.   On: 12/10/2018 16:53    Procedures Procedures (including critical care time)  Medications Ordered in ED Medications  albuterol (VENTOLIN HFA) 108 (90 Base) MCG/ACT inhaler 2 puff (2 puffs Inhalation Given 12/10/18 1828)  predniSONE (DELTASONE) tablet 60 mg (60 mg Oral Given 12/10/18 1822)     Initial Impression / Assessment and Plan / ED Course  I have reviewed the triage vital signs and the nursing notes.  Pertinent labs & imaging results that were available during my care of the patient were  reviewed by me and considered in my medical decision making (see chart for details).        Patient with a little bit of wheezing does have a history of COPD.  Patient's chest x-ray negative for any pneumonia or pulmonary edema.  Will treat with albuterol inhaler and a course of prednisone.  The rash is nonspecific.  No evidence of any significant bullae or peeling of the skin.  Also not consistent directly with a contact dermatitis.  Will treat with prednisone already treating with prednisone for the COPD emphysema exacerbation.  Patient given albuterol inhaler to go.  Given a prescription for prednisone.  First dose of prednisone provided here.  Patient symptoms do not seem to be consistent with COVID-19 infection.  Final Clinical Impressions(s) / ED Diagnoses   Final diagnoses:  COPD exacerbation ([place])  Rash    ED Discharge Orders         Ordered    predniSONE (DELTASONE) 10 MG tablet  Daily     12/10/18 1848           Fredia Sorrow, MD 12/10/18 [phone]

## 2018-12-10 NOTE — ED Triage Notes (Signed)
Sob x 2 days. Body aches. Homeless. Oxygen sat 100%. States he has a rash.

## 2019-01-26 ENCOUNTER — Emergency Department (HOSPITAL_COMMUNITY): Payer: Medicaid Other

## 2019-01-26 ENCOUNTER — Inpatient Hospital Stay (HOSPITAL_COMMUNITY)
Admission: EM | Admit: 2019-01-26 | Discharge: 2019-02-02 | DRG: 378 | Disposition: A | Discharge status: Home / Self Care | Payer: Medicaid Other | Attending: Internal Medicine | Admitting: Internal Medicine

## 2019-01-26 ENCOUNTER — Other Ambulatory Visit: Payer: Self-pay

## 2019-01-26 ENCOUNTER — Encounter (HOSPITAL_COMMUNITY): Payer: Self-pay | Admitting: Internal Medicine

## 2019-01-26 DIAGNOSIS — Y92009 Unspecified place in unspecified non-institutional (private) residence as the place of occurrence of the external cause: Secondary | ICD-10-CM | POA: Diagnosis not present

## 2019-01-26 DIAGNOSIS — I959 Hypotension, unspecified: Secondary | ICD-10-CM | POA: Diagnosis present

## 2019-01-26 DIAGNOSIS — J449 Chronic obstructive pulmonary disease, unspecified: Secondary | ICD-10-CM | POA: Diagnosis present

## 2019-01-26 DIAGNOSIS — R296 Repeated falls: Secondary | ICD-10-CM | POA: Diagnosis present

## 2019-01-26 DIAGNOSIS — K635 Polyp of colon: Secondary | ICD-10-CM | POA: Diagnosis present

## 2019-01-26 DIAGNOSIS — R1011 Right upper quadrant pain: Secondary | ICD-10-CM | POA: Diagnosis not present

## 2019-01-26 DIAGNOSIS — F17210 Nicotine dependence, cigarettes, uncomplicated: Secondary | ICD-10-CM | POA: Diagnosis present

## 2019-01-26 DIAGNOSIS — I2510 Atherosclerotic heart disease of native coronary artery without angina pectoris: Secondary | ICD-10-CM | POA: Diagnosis not present

## 2019-01-26 DIAGNOSIS — K5900 Constipation, unspecified: Secondary | ICD-10-CM | POA: Diagnosis not present

## 2019-01-26 DIAGNOSIS — R64 Cachexia: Secondary | ICD-10-CM | POA: Diagnosis not present

## 2019-01-26 DIAGNOSIS — E871 Hypo-osmolality and hyponatremia: Secondary | ICD-10-CM | POA: Diagnosis not present

## 2019-01-26 DIAGNOSIS — I428 Other cardiomyopathies: Secondary | ICD-10-CM | POA: Diagnosis not present

## 2019-01-26 DIAGNOSIS — K922 Gastrointestinal hemorrhage, unspecified: Secondary | ICD-10-CM | POA: Diagnosis not present

## 2019-01-26 DIAGNOSIS — Z681 Body mass index (BMI) 19 or less, adult: Secondary | ICD-10-CM

## 2019-01-26 DIAGNOSIS — S0990XA Unspecified injury of head, initial encounter: Secondary | ICD-10-CM | POA: Diagnosis not present

## 2019-01-26 DIAGNOSIS — F1010 Alcohol abuse, uncomplicated: Secondary | ICD-10-CM | POA: Diagnosis not present

## 2019-01-26 DIAGNOSIS — K2980 Duodenitis without bleeding: Secondary | ICD-10-CM | POA: Diagnosis present

## 2019-01-26 DIAGNOSIS — Z5900 Homelessness unspecified: Secondary | ICD-10-CM

## 2019-01-26 DIAGNOSIS — R945 Abnormal results of liver function studies: Secondary | ICD-10-CM

## 2019-01-26 DIAGNOSIS — W19XXXA Unspecified fall, initial encounter: Secondary | ICD-10-CM | POA: Diagnosis present

## 2019-01-26 DIAGNOSIS — R197 Diarrhea, unspecified: Secondary | ICD-10-CM | POA: Diagnosis present

## 2019-01-26 DIAGNOSIS — K648 Other hemorrhoids: Secondary | ICD-10-CM | POA: Diagnosis not present

## 2019-01-26 DIAGNOSIS — K828 Other specified diseases of gallbladder: Secondary | ICD-10-CM | POA: Diagnosis not present

## 2019-01-26 DIAGNOSIS — R0789 Other chest pain: Secondary | ICD-10-CM | POA: Diagnosis not present

## 2019-01-26 DIAGNOSIS — K449 Diaphragmatic hernia without obstruction or gangrene: Secondary | ICD-10-CM | POA: Diagnosis present

## 2019-01-26 DIAGNOSIS — I110 Hypertensive heart disease with heart failure: Secondary | ICD-10-CM | POA: Diagnosis present

## 2019-01-26 DIAGNOSIS — D125 Benign neoplasm of sigmoid colon: Secondary | ICD-10-CM | POA: Diagnosis not present

## 2019-01-26 DIAGNOSIS — R188 Other ascites: Secondary | ICD-10-CM | POA: Diagnosis not present

## 2019-01-26 DIAGNOSIS — K921 Melena: Secondary | ICD-10-CM | POA: Diagnosis not present

## 2019-01-26 DIAGNOSIS — R519 Headache, unspecified: Secondary | ICD-10-CM | POA: Diagnosis present

## 2019-01-26 DIAGNOSIS — J9601 Acute respiratory failure with hypoxia: Secondary | ICD-10-CM | POA: Diagnosis not present

## 2019-01-26 DIAGNOSIS — Z20828 Contact with and (suspected) exposure to other viral communicable diseases: Secondary | ICD-10-CM | POA: Diagnosis not present

## 2019-01-26 DIAGNOSIS — K709 Alcoholic liver disease, unspecified: Secondary | ICD-10-CM | POA: Diagnosis present

## 2019-01-26 DIAGNOSIS — K222 Esophageal obstruction: Secondary | ICD-10-CM | POA: Diagnosis present

## 2019-01-26 DIAGNOSIS — Z85118 Personal history of other malignant neoplasm of bronchus and lung: Secondary | ICD-10-CM

## 2019-01-26 DIAGNOSIS — F1020 Alcohol dependence, uncomplicated: Secondary | ICD-10-CM | POA: Diagnosis present

## 2019-01-26 DIAGNOSIS — Z9861 Coronary angioplasty status: Secondary | ICD-10-CM

## 2019-01-26 DIAGNOSIS — Z8249 Family history of ischemic heart disease and other diseases of the circulatory system: Secondary | ICD-10-CM

## 2019-01-26 DIAGNOSIS — Z7982 Long term (current) use of aspirin: Secondary | ICD-10-CM

## 2019-01-26 DIAGNOSIS — E440 Moderate protein-calorie malnutrition: Secondary | ICD-10-CM | POA: Diagnosis present

## 2019-01-26 DIAGNOSIS — R7989 Other specified abnormal findings of blood chemistry: Secondary | ICD-10-CM

## 2019-01-26 DIAGNOSIS — Z7952 Long term (current) use of systemic steroids: Secondary | ICD-10-CM

## 2019-01-26 DIAGNOSIS — R079 Chest pain, unspecified: Secondary | ICD-10-CM | POA: Diagnosis not present

## 2019-01-26 DIAGNOSIS — K2971 Gastritis, unspecified, with bleeding: Secondary | ICD-10-CM | POA: Diagnosis not present

## 2019-01-26 DIAGNOSIS — Z789 Other specified health status: Secondary | ICD-10-CM

## 2019-01-26 DIAGNOSIS — K2970 Gastritis, unspecified, without bleeding: Secondary | ICD-10-CM | POA: Diagnosis present

## 2019-01-26 DIAGNOSIS — E876 Hypokalemia: Secondary | ICD-10-CM | POA: Diagnosis not present

## 2019-01-26 DIAGNOSIS — D6959 Other secondary thrombocytopenia: Secondary | ICD-10-CM | POA: Diagnosis present

## 2019-01-26 DIAGNOSIS — K621 Rectal polyp: Secondary | ICD-10-CM | POA: Diagnosis not present

## 2019-01-26 DIAGNOSIS — D128 Benign neoplasm of rectum: Secondary | ICD-10-CM | POA: Diagnosis not present

## 2019-01-26 DIAGNOSIS — Z7289 Other problems related to lifestyle: Secondary | ICD-10-CM | POA: Diagnosis not present

## 2019-01-26 DIAGNOSIS — S2242XA Multiple fractures of ribs, left side, initial encounter for closed fracture: Secondary | ICD-10-CM | POA: Diagnosis not present

## 2019-01-26 DIAGNOSIS — I5082 Biventricular heart failure: Secondary | ICD-10-CM | POA: Diagnosis present

## 2019-01-26 DIAGNOSIS — D62 Acute posthemorrhagic anemia: Secondary | ICD-10-CM | POA: Diagnosis not present

## 2019-01-26 DIAGNOSIS — Z79899 Other long term (current) drug therapy: Secondary | ICD-10-CM

## 2019-01-26 DIAGNOSIS — D539 Nutritional anemia, unspecified: Secondary | ICD-10-CM | POA: Diagnosis present

## 2019-01-26 DIAGNOSIS — S199XXA Unspecified injury of neck, initial encounter: Secondary | ICD-10-CM | POA: Diagnosis not present

## 2019-01-26 DIAGNOSIS — Z85828 Personal history of other malignant neoplasm of skin: Secondary | ICD-10-CM

## 2019-01-26 DIAGNOSIS — D649 Anemia, unspecified: Secondary | ICD-10-CM

## 2019-01-26 DIAGNOSIS — K2950 Unspecified chronic gastritis without bleeding: Secondary | ICD-10-CM | POA: Diagnosis not present

## 2019-01-26 DIAGNOSIS — K2920 Alcoholic gastritis without bleeding: Secondary | ICD-10-CM | POA: Diagnosis not present

## 2019-01-26 DIAGNOSIS — Z888 Allergy status to other drugs, medicaments and biological substances status: Secondary | ICD-10-CM

## 2019-01-26 DIAGNOSIS — S299XXA Unspecified injury of thorax, initial encounter: Secondary | ICD-10-CM | POA: Diagnosis not present

## 2019-01-26 LAB — CBC WITH DIFFERENTIAL/PLATELET
Abs Immature Granulocytes: 0.05 10*3/uL (ref 0.00–0.07)
Basophils Absolute: 0.1 10*3/uL (ref 0.0–0.1)
Basophils Relative: 1 %
Eosinophils Absolute: 0.1 10*3/uL (ref 0.0–0.5)
Eosinophils Relative: 1 %
HCT: 23.8 % — ABNORMAL LOW (ref 39.0–52.0)
Hemoglobin: 8.2 g/dL — ABNORMAL LOW (ref 13.0–17.0)
Immature Granulocytes: 1 %
Lymphocytes Relative: 16 %
Lymphs Abs: 1.6 10*3/uL (ref 0.7–4.0)
MCH: 36.6 pg — ABNORMAL HIGH (ref 26.0–34.0)
MCHC: 34.5 g/dL (ref 30.0–36.0)
MCV: 106.3 fL — ABNORMAL HIGH (ref 80.0–100.0)
Monocytes Absolute: 0.8 10*3/uL (ref 0.1–1.0)
Monocytes Relative: 9 %
Neutro Abs: 7.3 10*3/uL (ref 1.7–7.7)
Neutrophils Relative %: 72 %
Platelets: 87 10*3/uL — ABNORMAL LOW (ref 150–400)
RBC: 2.24 MIL/uL — ABNORMAL LOW (ref 4.22–5.81)
RDW: 11.8 % (ref 11.5–15.5)
WBC: 9.9 10*3/uL (ref 4.0–10.5)
nRBC: 0 % (ref 0.0–0.2)

## 2019-01-26 LAB — CBC
HCT: 21.9 % — ABNORMAL LOW (ref 39.0–52.0)
Hemoglobin: 7.6 g/dL — ABNORMAL LOW (ref 13.0–17.0)
MCH: 37.1 pg — ABNORMAL HIGH (ref 26.0–34.0)
MCHC: 34.7 g/dL (ref 30.0–36.0)
MCV: 106.8 fL — ABNORMAL HIGH (ref 80.0–100.0)
Platelets: 76 10*3/uL — ABNORMAL LOW (ref 150–400)
RBC: 2.05 MIL/uL — ABNORMAL LOW (ref 4.22–5.81)
RDW: 11.8 % (ref 11.5–15.5)
WBC: 9.2 10*3/uL (ref 4.0–10.5)
nRBC: 0 % (ref 0.0–0.2)

## 2019-01-26 LAB — COMPREHENSIVE METABOLIC PANEL
ALT: 33 U/L (ref 0–44)
ALT: 34 U/L (ref 0–44)
AST: 117 U/L — ABNORMAL HIGH (ref 15–41)
AST: 111 U/L — ABNORMAL HIGH (ref 15–41)
Albumin: 3.2 g/dL — ABNORMAL LOW (ref 3.5–5.0)
Albumin: 2.9 g/dL — ABNORMAL LOW (ref 3.5–5.0)
Alkaline Phosphatase: 99 U/L (ref 38–126)
Alkaline Phosphatase: 93 U/L (ref 38–126)
Anion gap: 19 — ABNORMAL HIGH (ref 5–15)
Anion gap: 10 (ref 5–15)
BUN: 7 mg/dL (ref 6–20)
BUN: 6 mg/dL (ref 6–20)
CO2: 22 mmol/L (ref 22–32)
CO2: 24 mmol/L (ref 22–32)
Calcium: 8.2 mg/dL — ABNORMAL LOW (ref 8.9–10.3)
Calcium: 7.8 mg/dL — ABNORMAL LOW (ref 8.9–10.3)
Chloride: 98 mmol/L (ref 98–111)
Chloride: 102 mmol/L (ref 98–111)
Creatinine, Ser: 0.66 mg/dL (ref 0.61–1.24)
Creatinine, Ser: 0.6 mg/dL — ABNORMAL LOW (ref 0.61–1.24)
GFR calc Af Amer: >60 mL/min (ref >60)
GFR calc Af Amer: >60 mL/min (ref >60)
GFR calc non Af Amer: >60 mL/min (ref >60)
GFR calc non Af Amer: >60 mL/min (ref >60)
Glucose, Bld: 91 mg/dL (ref 70–99)
Glucose, Bld: 93 mg/dL (ref 70–99)
Potassium: 2.3 mmol/L — CL (ref 3.5–5.1)
Potassium: 2.4 mmol/L — CL (ref 3.5–5.1)
Sodium: 139 mmol/L (ref 135–145)
Sodium: 136 mmol/L (ref 135–145)
Total Bilirubin: 1.4 mg/dL — ABNORMAL HIGH (ref 0.3–1.2)
Total Bilirubin: 1.1 mg/dL (ref 0.3–1.2)
Total Protein: 6.4 g/dL — ABNORMAL LOW (ref 6.5–8.1)
Total Protein: 6 g/dL — ABNORMAL LOW (ref 6.5–8.1)

## 2019-01-26 LAB — ETHANOL: Alcohol, Ethyl (B): 261 mg/dL — ABNORMAL HIGH (ref <10)

## 2019-01-26 LAB — URINALYSIS, ROUTINE W REFLEX MICROSCOPIC
Bacteria, UA: NONE SEEN
Glucose, UA: NEGATIVE mg/dL
Hgb urine dipstick: NEGATIVE
Ketones, ur: NEGATIVE mg/dL
Leukocytes,Ua: NEGATIVE
Nitrite: NEGATIVE
Protein, ur: 100 mg/dL — AB
Specific Gravity, Urine: 1.016 (ref 1.005–1.030)
pH: 6 (ref 5.0–8.0)

## 2019-01-26 LAB — AMMONIA: Ammonia: 40 umol/L — ABNORMAL HIGH (ref 9–35)

## 2019-01-26 LAB — MAGNESIUM
Magnesium: 1.6 mg/dL — ABNORMAL LOW (ref 1.7–2.4)
Magnesium: 2 mg/dL (ref 1.7–2.4)

## 2019-01-26 LAB — PROTIME-INR
INR: 1.1 (ref 0.8–1.2)
Prothrombin Time: 14.1 s (ref 11.4–15.2)

## 2019-01-26 LAB — LIPASE, BLOOD: Lipase: 75 U/L — ABNORMAL HIGH (ref 11–51)

## 2019-01-26 LAB — TROPONIN I (HIGH SENSITIVITY)
Troponin I (High Sensitivity): 5 ng/L (ref <18)
Troponin I (High Sensitivity): 6 ng/L (ref <18)

## 2019-01-26 LAB — POC OCCULT BLOOD, ED: Fecal Occult Bld: NEGATIVE

## 2019-01-26 LAB — PHOSPHORUS: Phosphorus: 2.3 mg/dL — ABNORMAL LOW (ref 2.5–4.6)

## 2019-01-26 MED ORDER — SODIUM CHLORIDE 0.9 % IV BOLUS
1000.0000 mL | Freq: Once | INTRAVENOUS | Status: AC
  Administered 2019-01-26: 1000 mL via INTRAVENOUS

## 2019-01-26 MED ORDER — SODIUM CHLORIDE 0.9% FLUSH
3.0000 mL | Freq: Two times a day (BID) | INTRAVENOUS | Status: DC
  Administered 2019-01-26 – 2019-02-01 (×6): 3 mL via INTRAVENOUS

## 2019-01-26 MED ORDER — POTASSIUM CHLORIDE 10 MEQ/100ML IV SOLN
10.0000 meq | INTRAVENOUS | Status: DC
  Filled 2019-01-26: qty 100

## 2019-01-26 MED ORDER — THIAMINE HCL 100 MG/ML IJ SOLN
100.0000 mg | Freq: Every day | INTRAMUSCULAR | Status: DC
  Filled 2019-01-26 (×2): qty 2

## 2019-01-26 MED ORDER — TRAMADOL HCL 50 MG PO TABS
50.0000 mg | ORAL_TABLET | Freq: Four times a day (QID) | ORAL | Status: DC | PRN
  Administered 2019-01-26 – 2019-02-01 (×10): 50 mg via ORAL
  Filled 2019-01-26 (×10): qty 1

## 2019-01-26 MED ORDER — POTASSIUM CHLORIDE 10 MEQ/100ML IV SOLN
10.0000 meq | INTRAVENOUS | Status: AC
  Administered 2019-01-26 – 2019-01-27 (×6): 10 meq via INTRAVENOUS
  Filled 2019-01-26 (×5): qty 100

## 2019-01-26 MED ORDER — SODIUM CHLORIDE 0.9 % IV SOLN
250.0000 mL | INTRAVENOUS | Status: DC | PRN
  Administered 2019-01-28: 250 mL via INTRAVENOUS

## 2019-01-26 MED ORDER — VITAMIN B-1 100 MG PO TABS
100.0000 mg | ORAL_TABLET | Freq: Every day | ORAL | Status: DC
  Administered 2019-01-26 – 2019-02-02 (×8): 100 mg via ORAL
  Filled 2019-01-26 (×8): qty 1

## 2019-01-26 MED ORDER — MAGNESIUM SULFATE 2 GM/50ML IV SOLN
2.0000 g | Freq: Once | INTRAVENOUS | Status: AC
  Administered 2019-01-26: 2 g via INTRAVENOUS
  Filled 2019-01-26: qty 50

## 2019-01-26 MED ORDER — FOLIC ACID 1 MG PO TABS
1.0000 mg | ORAL_TABLET | Freq: Every day | ORAL | Status: DC
  Administered 2019-01-26 – 2019-02-02 (×8): 1 mg via ORAL
  Filled 2019-01-26 (×8): qty 1

## 2019-01-26 MED ORDER — LORAZEPAM 2 MG/ML IJ SOLN: 1.0000 mg | INTRAMUSCULAR | Status: AC | PRN

## 2019-01-26 MED ORDER — POTASSIUM CHLORIDE 10 MEQ/100ML IV SOLN
10.0000 meq | Freq: Once | INTRAVENOUS | Status: AC
  Administered 2019-01-26: 10 meq via INTRAVENOUS
  Filled 2019-01-26: qty 100

## 2019-01-26 MED ORDER — ROSUVASTATIN CALCIUM 10 MG PO TABS
10.0000 mg | ORAL_TABLET | Freq: Every day | ORAL | Status: DC
  Administered 2019-01-27 – 2019-02-01 (×6): 10 mg via ORAL
  Filled 2019-01-26 (×6): qty 1

## 2019-01-26 MED ORDER — SODIUM CHLORIDE 0.9 % IV SOLN
80.0000 mg | Freq: Once | INTRAVENOUS | Status: AC
  Administered 2019-01-26: 80 mg via INTRAVENOUS
  Filled 2019-01-26: qty 80

## 2019-01-26 MED ORDER — LORAZEPAM 1 MG PO TABS: 1.0000 mg | ORAL_TABLET | ORAL | Status: AC | PRN

## 2019-01-26 MED ORDER — ADULT MULTIVITAMIN W/MINERALS CH
1.0000 | ORAL_TABLET | Freq: Every day | ORAL | Status: DC
  Administered 2019-01-26 – 2019-02-02 (×8): 1 via ORAL
  Filled 2019-01-26 (×8): qty 1

## 2019-01-26 MED ORDER — SODIUM CHLORIDE 0.9 % IV SOLN
8.0000 mg/h | INTRAVENOUS | Status: DC
  Administered 2019-01-26 – 2019-01-28 (×5): 8 mg/h via INTRAVENOUS
  Filled 2019-01-26 (×7): qty 80

## 2019-01-26 MED ORDER — SODIUM CHLORIDE 0.9% FLUSH: 3.0000 mL | INTRAVENOUS | Status: DC | PRN

## 2019-01-26 MED ORDER — VITAMIN B-1 100 MG PO TABS: 100.0000 mg | ORAL_TABLET | Freq: Every day | ORAL | Status: DC

## 2019-01-26 MED ORDER — SODIUM CHLORIDE 0.9% FLUSH
3.0000 mL | Freq: Two times a day (BID) | INTRAVENOUS | Status: DC
  Administered 2019-01-26 – 2019-01-31 (×6): 3 mL via INTRAVENOUS

## 2019-01-26 MED ORDER — NICOTINE 21 MG/24HR TD PT24
21.0000 mg | MEDICATED_PATCH | Freq: Every day | TRANSDERMAL | Status: DC
  Administered 2019-01-27 – 2019-02-02 (×7): 21 mg via TRANSDERMAL
  Filled 2019-01-26 (×7): qty 1

## 2019-01-26 NOTE — ED Triage Notes (Signed)
Pt arrived [facility] from community pt is homeless CC Head and chest pain r/t Fall 2 days ago. Per EMS pt smells of ETOH and is covered in feces. Time of last drink is unknown.   18G Left FA 563ml in route

## 2019-01-26 NOTE — ED Provider Notes (Signed)
[facility] DEPT Provider Note   CSN: [id number] Arrival date & time: 01/26/19  1513     History   Chief Complaint Chief Complaint  Patient presents with   Fall    Head Pain    HPI Trevor Williams is a 58 y.o. male.     Pt presents to the ED today with head and CP.  The pt has a hx of alcohol abuse and is homeless.  [place]  He has been drinking heavily and fell yesterday and today.  He said he's been feeling very dizzy.  The pt's initial BP by EMS was in the 80s.  The pt c/o headache and right sided cp from the fall.  Pt also said he's had severe diarrhea for the last few days.  He said his stool is very black.  He denies any vomiting blood.     Past Medical History:  Diagnosis Date   Abnormal liver function tests    Acute respiratory failure ([place])    a. 07/2018 requiring intubation - CAP/CHF.   Alcohol abuse    Biventricular heart failure (HCC)    Cancer (HCC)    skin (left hand)   Delirium    a. h/o delirium while admitted   Emphysema of lung (HCC)    Homelessness    Macrocytic anemia    Mild CAD    a. cath 07/2018 60% ostial ramus otherwise OK.   Mitral regurgitation    NICM (nonischemic cardiomyopathy) (HCC)    Protein calorie malnutrition (HCC)    Tricuspid regurgitation     Patient Active Problem List   Diagnosis Date Noted   Acute respiratory failure with hypoxia ([place]) 07/29/2018   Community acquired pneumonia 07/29/2018    Past Surgical History:  Procedure Laterality Date   RIGHT/LEFT HEART CATH AND CORONARY ANGIOGRAPHY N/A 08/09/2018   Procedure: RIGHT/LEFT HEART CATH AND CORONARY ANGIOGRAPHY;  Surgeon: Larey Dresser, MD;  Location: [facility];  Service: Cardiovascular;  Laterality: N/A;        Home Medications    Prior to Admission medications   Medication Sig Start Date End Date Taking? Authorizing Provider  aspirin EC 81 MG tablet Take 1 tablet (81 mg total) by mouth  daily. 08/12/18 08/12/19  Lavina Hamman, MD  carvedilol (COREG) 3.125 MG tablet Take 1 tablet (3.125 mg total) by mouth 2 (two) times daily with a meal. 08/12/18   Lavina Hamman, MD  diclofenac sodium (VOLTAREN) 1 % GEL Apply 2 g topically 4 (four) times daily. 11/02/18   Domenic Moras, PA-C  feeding supplement, ENSURE ENLIVE, (ENSURE ENLIVE) LIQD Take 237 mLs by mouth 3 (three) times daily between meals. 08/12/18   Lavina Hamman, MD  folic acid (FOLVITE) 1 MG tablet Take 1 tablet (1 mg total) by mouth daily. 08/12/18   Lavina Hamman, MD  nicotine (NICODERM CQ - DOSED IN MG/24 HOURS) 21 mg/24hr patch Place 1 patch (21 mg total) onto the skin daily. 08/12/18   Lavina Hamman, MD  predniSONE (DELTASONE) 10 MG tablet Take 4 tablets (40 mg total) by mouth daily. 12/10/18   Fredia Sorrow, MD  rosuvastatin (CRESTOR) 10 MG tablet Take 1 tablet (10 mg total) by mouth daily at 6 PM. 08/12/18   Lavina Hamman, MD  sacubitril-valsartan (ENTRESTO) 24-26 MG Take 1 tablet by mouth 2 (two) times daily. 08/12/18   Lavina Hamman, MD  thiamine 100 MG tablet Take 1 tablet (100  mg total) by mouth daily. 08/12/18   Lavina Hamman, MD    Family History Family History  Problem Relation Age of Onset   Hypertension Maternal Grandfather     Social History Social History   Tobacco Use   Smoking status: Current Every Day Smoker    Packs/day: 1.00    Types: Cigarettes   Smokeless tobacco: Current User  Substance Use Topics   Alcohol use: Yes    Comment: Liquor daily.   Drug use: No     Allergies   Doxycycline, Ibuprofen, Rocephin [ceftriaxone], Tylenol [acetaminophen], and Vancomycin   Review of Systems Review of Systems  Gastrointestinal: Positive for diarrhea.  Musculoskeletal:       Chest wall pain  Neurological: Positive for headaches.  All other systems reviewed and are negative.    Physical Exam Updated Vital Signs BP (!) 119/45    Pulse 89    Temp 98.2 F (36.8 C) (Oral)    Resp  19    SpO2 99%   Physical Exam Vitals signs and nursing note reviewed.  Constitutional:      Appearance: Normal appearance. He is underweight.  HENT:     Head: Normocephalic.      Right Ear: External ear normal.     Left Ear: External ear normal.     Nose: Nose normal.     Mouth/Throat:     Mouth: Mucous membranes are dry.  Eyes:     General: Scleral icterus present.     Extraocular Movements: Extraocular movements intact.     Pupils: Pupils are equal, round, and reactive to light.  Neck:     Musculoskeletal: Normal range of motion and neck supple.  Cardiovascular:     Rate and Rhythm: Normal rate and regular rhythm.     Pulses: Normal pulses.     Heart sounds: Normal heart sounds.  Pulmonary:     Effort: Pulmonary effort is normal.     Breath sounds: Normal breath sounds.  Chest:    Abdominal:     General: Abdomen is flat. Bowel sounds are normal.     Palpations: Abdomen is soft.  Genitourinary:    Rectum: Guaiac result negative.     Comments: Guaiac was negative, but no stool on finger. Musculoskeletal: Normal range of motion.  Skin:    General: Skin is warm.     Capillary Refill: Capillary refill takes less than 2 seconds.  Neurological:     General: No focal deficit present.     Mental Status: He is alert and oriented to person, place, and time.  Psychiatric:        Mood and Affect: Mood normal.        Behavior: Behavior normal.      ED Treatments / Results  Labs (all labs ordered are listed, but only abnormal results are displayed) Labs Reviewed  COMPREHENSIVE METABOLIC PANEL - Abnormal; Notable for the following components:      Result Value   Potassium 2.3 (*)    Calcium 8.2 (*)    Total Protein 6.4 (*)    Albumin 3.2 (*)    AST 117 (*)    Total Bilirubin 1.4 (*)    Anion gap 19 (*)    All other components within normal limits  ETHANOL - Abnormal; Notable for the following components:   Alcohol, Ethyl (B) 261 (*)    All other components within  normal limits  CBC WITH DIFFERENTIAL/PLATELET - Abnormal; Notable for the following components:  RBC 2.24 (*)    Hemoglobin 8.2 (*)    HCT 23.8 (*)    MCV 106.3 (*)    MCH 36.6 (*)    Platelets 87 (*)    All other components within normal limits  LIPASE, BLOOD - Abnormal; Notable for the following components:   Lipase 75 (*)    All other components within normal limits  URINALYSIS, ROUTINE W REFLEX MICROSCOPIC - Abnormal; Notable for the following components:   Color, Urine AMBER (*)    Bilirubin Urine SMALL (*)    Protein, ur 100 (*)    All other components within normal limits  AMMONIA - Abnormal; Notable for the following components:   Ammonia 40 (*)    All other components within normal limits  MAGNESIUM - Abnormal; Notable for the following components:   Magnesium 1.6 (*)    All other components within normal limits  SARS CORONAVIRUS 2 (TAT 6-24 HRS)  PROTIME-INR  PROTIME-INR  POC OCCULT BLOOD, ED  TYPE AND SCREEN  ABO/RH  TROPONIN I (HIGH SENSITIVITY)  TROPONIN I (HIGH SENSITIVITY)    EKG EKG Interpretation  Date/Time:  Sunday January 26 2019 16:06:49 EST Ventricular Rate:  85 PR Interval:    QRS Duration: 99 QT Interval:  444 QTC Calculation: 528 R Axis:   80 Text Interpretation: Normal sinus rhythm Anteroseptal infarct, age indeterminate Prolonged QT interval No significant change since last tracing Confirmed by Isla Pence [phone]) on 01/26/2019 4:26:04 PM   Radiology Dg Chest 2 View  Result Date: 01/26/2019 CLINICAL DATA:  Fall EXAM: CHEST - 2 VIEW COMPARISON:  12/10/2018 FINDINGS: The heart size and mediastinal contours are within normal limits. Pulmonary hyperinflation and emphysema. Disc degenerative disease of the thoracic spine. Multiple nonacute left-sided rib fractures. IMPRESSION: Emphysema without acute abnormality of the lungs. Electronically Signed   By: Eddie Candle M.D.   On: 01/26/2019 16:43   Ct Head Wo Contrast  Result Date:  01/26/2019 CLINICAL DATA:  Headache, post-traumatic, fall 2 days ago EXAM: CT HEAD WITHOUT CONTRAST CT CERVICAL SPINE WITHOUT CONTRAST TECHNIQUE: Multidetector CT imaging of the head and cervical spine was performed following the standard protocol without intravenous contrast. Multiplanar CT image reconstructions of the cervical spine were also generated. COMPARISON:  09/07/2016 FINDINGS: CT HEAD FINDINGS Brain: No evidence of acute infarction, hemorrhage, hydrocephalus, extra-axial collection or mass lesion/mass effect. Redemonstrated right MCA territory encephalomalacia. Periventricular white matter hypodensity and volume loss, increased compared to prior examination. Vascular: No hyperdense vessel or unexpected calcification. Skull: Normal. Negative for fracture or focal lesion. Sinuses/Orbits: No acute finding. Other: None. CT CERVICAL SPINE FINDINGS Alignment: Normal. Skull base and vertebrae: No acute fracture. No primary bone lesion or focal pathologic process. Soft tissues and spinal canal: No prevertebral fluid or swelling. No visible canal hematoma. Disc levels: Moderate multilevel disc space height loss and osteophytosis. Upper chest: Emphysema in the lung apices. Other: None. IMPRESSION: 1.  No acute intracranial pathology. 2. Redemonstrated right MCA territory encephalomalacia, in keeping with prior infarction. 3. Small-vessel white matter disease and global volume loss, notably increased compared to prior examination dated 09/07/2016. 4. No fracture or static subluxation of the cervical spine. Moderate multilevel disc degenerative disease and osteophytosis. 5.  Emphysema (ICD10-J43.9). Electronically Signed   By: Eddie Candle M.D.   On: 01/26/2019 16:43   Ct Cervical Spine Wo Contrast  Result Date: 01/26/2019 CLINICAL DATA:  Headache, post-traumatic, fall 2 days ago EXAM: CT HEAD WITHOUT CONTRAST CT CERVICAL SPINE WITHOUT CONTRAST TECHNIQUE: Multidetector CT  imaging of the head and cervical spine  was performed following the standard protocol without intravenous contrast. Multiplanar CT image reconstructions of the cervical spine were also generated. COMPARISON:  09/07/2016 FINDINGS: CT HEAD FINDINGS Brain: No evidence of acute infarction, hemorrhage, hydrocephalus, extra-axial collection or mass lesion/mass effect. Redemonstrated right MCA territory encephalomalacia. Periventricular white matter hypodensity and volume loss, increased compared to prior examination. Vascular: No hyperdense vessel or unexpected calcification. Skull: Normal. Negative for fracture or focal lesion. Sinuses/Orbits: No acute finding. Other: None. CT CERVICAL SPINE FINDINGS Alignment: Normal. Skull base and vertebrae: No acute fracture. No primary bone lesion or focal pathologic process. Soft tissues and spinal canal: No prevertebral fluid or swelling. No visible canal hematoma. Disc levels: Moderate multilevel disc space height loss and osteophytosis. Upper chest: Emphysema in the lung apices. Other: None. IMPRESSION: 1.  No acute intracranial pathology. 2. Redemonstrated right MCA territory encephalomalacia, in keeping with prior infarction. 3. Small-vessel white matter disease and global volume loss, notably increased compared to prior examination dated 09/07/2016. 4. No fracture or static subluxation of the cervical spine. Moderate multilevel disc degenerative disease and osteophytosis. 5.  Emphysema (ICD10-J43.9). Electronically Signed   By: Eddie Candle M.D.   On: 01/26/2019 16:43    Procedures Procedures (including critical care time)  Medications Ordered in ED Medications  pantoprazole (PROTONIX) 80 mg in sodium chloride 0.9 % 250 mL (0.32 mg/mL) infusion (8 mg/hr Intravenous New Bag/Given 01/26/19 1818)  potassium chloride 10 mEq in 100 mL IVPB (10 mEq Intravenous New Bag/Given 01/26/19 1815)  sodium chloride 0.9 % bolus 1,000 mL (0 mLs Intravenous Stopped 01/26/19 1817)  pantoprazole (PROTONIX) 80 mg in sodium  chloride 0.9 % 100 mL IVPB (0 mg Intravenous Stopped 01/26/19 1819)  magnesium sulfate IVPB 2 g 50 mL (0 g Intravenous Stopped 01/26/19 1817)     Initial Impression / Assessment and Plan / ED Course  I have reviewed the triage vital signs and the nursing notes.  Pertinent labs & imaging results that were available during my care of the patient were reviewed by me and considered in my medical decision making (see chart for details).    I suspect pt has a GI bleed as he's a drinker and has been having black stools.  His hgb has dropped from 11.5 in September to 8.2 today.  The pt was started on protonix.  The pt was d/w Dr. Paulita Fujita who does not have any further suggestions.  The pt is hypokalemic and hypomagnesemic, which are replaced.  Pt d/w Dr. Gloriann Loan for admission.  CRITICAL CARE Performed by: Isla Pence   Total critical care time: 30 minutes  Critical care time was exclusive of separately billable procedures and treating other patients.  Critical care was necessary to treat or prevent imminent or life-threatening deterioration.  Critical care was time spent personally by me on the following activities: development of treatment plan with patient and/or surrogate as well as nursing, discussions with consultants, evaluation of patient's response to treatment, examination of patient, obtaining history from patient or surrogate, ordering and performing treatments and interventions, ordering and review of laboratory studies, ordering and review of radiographic studies, pulse oximetry and re-evaluation of patient's condition.  Trevor Williams was evaluated in Emergency Department on 01/26/2019 for the symptoms described in the history of present illness. He was evaluated in the context of the global COVID-19 pandemic, which necessitated consideration that the patient might be at risk for infection with the SARS-CoV-2 virus that causes COVID-19. Institutional protocols and  algorithms that pertain  to the evaluation of patients at risk for COVID-19 are in a state of rapid change based on information released by regulatory bodies including the CDC and federal and state organizations. These policies and algorithms were followed during the patient's care in the ED.    Final Clinical Impressions(s) / ED Diagnoses   Final diagnoses:  Hypokalemia  Hypomagnesemia  Fall, initial encounter  Anemia, unspecified type  Homeless single person  Alcohol abuse    ED Discharge Orders    None       Isla Pence, MD 01/26/19 1836

## 2019-01-26 NOTE — Progress Notes (Signed)
CRITICAL VALUE ALERT  Critical Value:  2.4 Date & Time Notied:  2203  Provider Notified:yes Orders Received/Actions taken:yes

## 2019-01-26 NOTE — H&P (Addendum)
History and Physical    Trevor Williams [id number] DOB: February 15, 1961 DOA: 01/26/2019  PCP: Marliss Coots, NP  Patient coming from: Home  I have personally briefly reviewed patient's old medical records in [place]  Chief Complaint: Fall  HPI: Trevor Williams is a 58 y.o. male with medical history significant of alcohol abuse, COPD, tobacco abuse, and lung cancer who presents for multiple falls with generalized weakness.  He states that over the last 2 to 3 weeks he has noticed increasing weakness as well as lightheadedness and dizziness.  He describes the weakness as severe.  He is unable to identify any exacerbating or remitting factors.  He admits to associated dark and tarry stools.  He also admits to associated shortness of breath however denies any chest pain.  He denies any syncopal events despite the multiple falls.  He denies any injuries from the falls.  He reports no similar events in the past.  Of note, the patient is homeless.  He denies any fever, chills, nausea, vomiting, abdominal pain, dysuria.  ED Course: In the ED the patient was found to be mildly hypotensive however responded to IV fluid.  He was also found to be severely hypokalemic and hypomagnesemic and received replacement for both of these.  GI was called and consulted with no acute intervention required overnight.  Review of Systems: As per HPI otherwise 10 point review of systems negative.    Past Medical History:  Diagnosis Date   Abnormal liver function tests    Acute respiratory failure ([place])    a. 07/2018 requiring intubation - CAP/CHF.   Alcohol abuse    Biventricular heart failure (HCC)    Cancer (HCC)    skin (left hand)   Delirium    a. h/o delirium while admitted   Emphysema of lung (HCC)    Homelessness    Macrocytic anemia    Mild CAD    a. cath 07/2018 60% ostial ramus otherwise OK.   Mitral regurgitation    NICM (nonischemic cardiomyopathy) (HCC)    Protein calorie  malnutrition (HCC)    Tricuspid regurgitation     Past Surgical History:  Procedure Laterality Date   RIGHT/LEFT HEART CATH AND CORONARY ANGIOGRAPHY N/A 08/09/2018   Procedure: RIGHT/LEFT HEART CATH AND CORONARY ANGIOGRAPHY;  Surgeon: Larey Dresser, MD;  Location: [facility];  Service: Cardiovascular;  Laterality: N/A;     reports that he has been smoking cigarettes. He has been smoking about 1.00 pack per day. He uses smokeless tobacco. He reports current alcohol use. He reports that he does not use drugs.  Allergies  Allergen Reactions   Doxycycline Rash    Rash noted after administration of vancomycin, doxycycline, and ceftriaxone. Unclear cause of rash.   Ibuprofen Rash   Rocephin [Ceftriaxone] Rash    Rash noted after administration of vancomycin, doxycycline, and ceftriaxone. Unclear cause of rash.   Tylenol [Acetaminophen] Rash   Vancomycin Rash    Rash noted after administration of vancomycin, doxycycline, and ceftriaxone. Unclear cause of rash.    Family History  Problem Relation Age of Onset   Hypertension Maternal Grandfather   Family history reviewed  Prior to Admission medications   Medication Sig Start Date End Date Taking? Authorizing Provider  aspirin EC 81 MG tablet Take 1 tablet (81 mg total) by mouth daily. 08/12/18 08/12/19  Lavina Hamman, MD  carvedilol (COREG) 3.125 MG tablet Take 1 tablet (3.125 mg total) by mouth 2 (two) times daily with  a meal. 08/12/18   Lavina Hamman, MD  diclofenac sodium (VOLTAREN) 1 % GEL Apply 2 g topically 4 (four) times daily. 11/02/18   Domenic Moras, PA-C  feeding supplement, ENSURE ENLIVE, (ENSURE ENLIVE) LIQD Take 237 mLs by mouth 3 (three) times daily between meals. 08/12/18   Lavina Hamman, MD  folic acid (FOLVITE) 1 MG tablet Take 1 tablet (1 mg total) by mouth daily. 08/12/18   Lavina Hamman, MD  nicotine (NICODERM CQ - DOSED IN MG/24 HOURS) 21 mg/24hr patch Place 1 patch (21 mg total) onto the skin daily.  08/12/18   Lavina Hamman, MD  predniSONE (DELTASONE) 10 MG tablet Take 4 tablets (40 mg total) by mouth daily. 12/10/18   Fredia Sorrow, MD  rosuvastatin (CRESTOR) 10 MG tablet Take 1 tablet (10 mg total) by mouth daily at 6 PM. 08/12/18   Lavina Hamman, MD  sacubitril-valsartan (ENTRESTO) 24-26 MG Take 1 tablet by mouth 2 (two) times daily. 08/12/18   Lavina Hamman, MD  thiamine 100 MG tablet Take 1 tablet (100 mg total) by mouth daily. 08/12/18   Lavina Hamman, MD    Physical Exam: Vitals:   01/26/19 1700 01/26/19 1830 01/26/19 1900 01/26/19 2010  BP: 105/73 (!) 85/56 93/66 106/72  Pulse: 86 82  84  Resp: 15 18 16 16   Temp:    97.9 F (36.6 C)  TempSrc:    Oral  SpO2: 100% 100%  100%    Constitutional: NAD, calm, comfortable, thin and disheveled Vitals:   01/26/19 1700 01/26/19 1830 01/26/19 1900 01/26/19 2010  BP: 105/73 (!) 85/56 93/66 106/72  Pulse: 86 82  84  Resp: 15 18 16 16   Temp:    97.9 F (36.6 C)  TempSrc:    Oral  SpO2: 100% 100%  100%   Eyes: PERRL, lids and conjunctivae normal ENMT: Mucous membranes are moist. Posterior pharynx clear of any exudate or lesions.Normal dentition.  Neck: normal, supple, no masses, no thyromegaly Respiratory: clear to auscultation bilaterally, no wheezing, no crackles. Normal respiratory effort. No accessory muscle use.  Cardiovascular: Regular rate and rhythm, no murmurs / rubs / gallops. No extremity edema. 2+ pedal pulses. No carotid bruits.  Abdomen: no tenderness, no masses palpated. No hepatosplenomegaly. Bowel sounds positive.  Musculoskeletal: no clubbing / cyanosis. No joint deformity upper and lower extremities. Good ROM, no contractures. Normal muscle tone.  Skin: no rashes, lesions, ulcers. No induration.  Skin is dry particularly in the lower extremities. Neurologic: CN 2-12 grossly intact. Sensation intact, DTR normal. Strength 5/5 in all 4.  Psychiatric: Normal judgment and insight. Alert and oriented x 3. Normal  mood.   Labs on Admission: I have personally reviewed following labs and imaging studies  CBC: Recent Labs  Lab 01/26/19 1535  WBC 9.9  NEUTROABS 7.3  HGB 8.2*  HCT 23.8*  MCV 106.3*  PLT 87*   Basic Metabolic Panel: Recent Labs  Lab 01/26/19 1535  NA 139  K 2.3*  CL 98  CO2 22  GLUCOSE 91  BUN 7  CREATININE 0.66  CALCIUM 8.2*  MG 1.6*   GFR: CrCl cannot be calculated (Unknown ideal weight.). Liver Function Tests: Recent Labs  Lab 01/26/19 1535  AST 117*  ALT 33  ALKPHOS 99  BILITOT 1.4*  PROT 6.4*  ALBUMIN 3.2*   Recent Labs  Lab 01/26/19 1535  LIPASE 75*   Recent Labs  Lab 01/26/19 1535  AMMONIA 40*   Coagulation Profile: Recent  Labs  Lab 01/26/19 1735  INR 1.1   Cardiac Enzymes: No results for input(s): CKTOTAL, CKMB, CKMBINDEX, TROPONINI in the last 168 hours. BNP (last 3 results) No results for input(s): PROBNP in the last 8760 hours. HbA1C: No results for input(s): HGBA1C in the last 72 hours. CBG: No results for input(s): GLUCAP in the last 168 hours. Lipid Profile: No results for input(s): CHOL, HDL, LDLCALC, TRIG, CHOLHDL, LDLDIRECT in the last 72 hours. Thyroid Function Tests: No results for input(s): TSH, T4TOTAL, FREET4, T3FREE, THYROIDAB in the last 72 hours. Anemia Panel: No results for input(s): VITAMINB12, FOLATE, FERRITIN, TIBC, IRON, RETICCTPCT in the last 72 hours. Urine analysis:    Component Value Date/Time   COLORURINE AMBER (A) 01/26/2019 1535   APPEARANCEUR CLEAR 01/26/2019 1535   LABSPEC 1.016 01/26/2019 1535   PHURINE 6.0 01/26/2019 1535   GLUCOSEU NEGATIVE 01/26/2019 1535   HGBUR NEGATIVE 01/26/2019 1535   BILIRUBINUR SMALL (A) 01/26/2019 1535   KETONESUR NEGATIVE 01/26/2019 1535   PROTEINUR 100 (A) 01/26/2019 1535   NITRITE NEGATIVE 01/26/2019 1535   LEUKOCYTESUR NEGATIVE 01/26/2019 1535    Radiological Exams on Admission: Dg Chest 2 View  Result Date: 01/26/2019 CLINICAL DATA:  Fall EXAM: CHEST - 2  VIEW COMPARISON:  12/10/2018 FINDINGS: The heart size and mediastinal contours are within normal limits. Pulmonary hyperinflation and emphysema. Disc degenerative disease of the thoracic spine. Multiple nonacute left-sided rib fractures. IMPRESSION: Emphysema without acute abnormality of the lungs. Electronically Signed   By: Eddie Candle M.D.   On: 01/26/2019 16:43   Ct Head Wo Contrast  Result Date: 01/26/2019 CLINICAL DATA:  Headache, post-traumatic, fall 2 days ago EXAM: CT HEAD WITHOUT CONTRAST CT CERVICAL SPINE WITHOUT CONTRAST TECHNIQUE: Multidetector CT imaging of the head and cervical spine was performed following the standard protocol without intravenous contrast. Multiplanar CT image reconstructions of the cervical spine were also generated. COMPARISON:  09/07/2016 FINDINGS: CT HEAD FINDINGS Brain: No evidence of acute infarction, hemorrhage, hydrocephalus, extra-axial collection or mass lesion/mass effect. Redemonstrated right MCA territory encephalomalacia. Periventricular white matter hypodensity and volume loss, increased compared to prior examination. Vascular: No hyperdense vessel or unexpected calcification. Skull: Normal. Negative for fracture or focal lesion. Sinuses/Orbits: No acute finding. Other: None. CT CERVICAL SPINE FINDINGS Alignment: Normal. Skull base and vertebrae: No acute fracture. No primary bone lesion or focal pathologic process. Soft tissues and spinal canal: No prevertebral fluid or swelling. No visible canal hematoma. Disc levels: Moderate multilevel disc space height loss and osteophytosis. Upper chest: Emphysema in the lung apices. Other: None. IMPRESSION: 1.  No acute intracranial pathology. 2. Redemonstrated right MCA territory encephalomalacia, in keeping with prior infarction. 3. Small-vessel white matter disease and global volume loss, notably increased compared to prior examination dated 09/07/2016. 4. No fracture or static subluxation of the cervical spine.  Moderate multilevel disc degenerative disease and osteophytosis. 5.  Emphysema (ICD10-J43.9). Electronically Signed   By: Eddie Candle M.D.   On: 01/26/2019 16:43   Ct Cervical Spine Wo Contrast  Result Date: 01/26/2019 CLINICAL DATA:  Headache, post-traumatic, fall 2 days ago EXAM: CT HEAD WITHOUT CONTRAST CT CERVICAL SPINE WITHOUT CONTRAST TECHNIQUE: Multidetector CT imaging of the head and cervical spine was performed following the standard protocol without intravenous contrast. Multiplanar CT image reconstructions of the cervical spine were also generated. COMPARISON:  09/07/2016 FINDINGS: CT HEAD FINDINGS Brain: No evidence of acute infarction, hemorrhage, hydrocephalus, extra-axial collection or mass lesion/mass effect. Redemonstrated right MCA territory encephalomalacia. Periventricular white matter hypodensity and  volume loss, increased compared to prior examination. Vascular: No hyperdense vessel or unexpected calcification. Skull: Normal. Negative for fracture or focal lesion. Sinuses/Orbits: No acute finding. Other: None. CT CERVICAL SPINE FINDINGS Alignment: Normal. Skull base and vertebrae: No acute fracture. No primary bone lesion or focal pathologic process. Soft tissues and spinal canal: No prevertebral fluid or swelling. No visible canal hematoma. Disc levels: Moderate multilevel disc space height loss and osteophytosis. Upper chest: Emphysema in the lung apices. Other: None. IMPRESSION: 1.  No acute intracranial pathology. 2. Redemonstrated right MCA territory encephalomalacia, in keeping with prior infarction. 3. Small-vessel white matter disease and global volume loss, notably increased compared to prior examination dated 09/07/2016. 4. No fracture or static subluxation of the cervical spine. Moderate multilevel disc degenerative disease and osteophytosis. 5.  Emphysema (ICD10-J43.9). Electronically Signed   By: Eddie Candle M.D.   On: 01/26/2019 16:43    EKG: Independently reviewed.    Assessment/Plan Active Problems:   GI bleed   Alcohol use   Homeless   Hypokalemia   Hypotensive episode   Fall at home, initial encounter #GI bleed GI consult and admit the patient n.p.o. at midnight. Protonix drip. Every 6 hour hemoglobin and hematocrit PT/PTT in the a.m.  #Alcohol abuse Have placed patient on alcohol withdrawal prophylaxis  #Homeless Consulting social work  #Hypokalemia Patient with severe hypokalemia.  Providing IV potassium and rechecking BMP in a.m. also replacing magnesium.  #Hypotensive Patient was hypotensive in the ED and responded well to IV fluid.  Will monitor.  Will have orthostatic vitals checked.  #Hypertension  Patient on antihypertensives at home and holding it is due to his episode of hypotension.  DVT prophylaxis: Mechanical DVT prophylaxis only due to active GI bleed Code Status: Full code Disposition Plan: Anticipate discharge in 3 to 4 days pending GI work-up Consults called: Dr. Paulita Fujita, GI, called by ED Admission status: Inpatient    [name] DO [facility]  01/26/2019, 8:10 PM

## 2019-01-27 ENCOUNTER — Inpatient Hospital Stay (HOSPITAL_COMMUNITY): Payer: Medicaid Other

## 2019-01-27 DIAGNOSIS — K921 Melena: Principal | ICD-10-CM

## 2019-01-27 LAB — COMPREHENSIVE METABOLIC PANEL
ALT: 32 U/L (ref 0–44)
AST: 100 U/L — ABNORMAL HIGH (ref 15–41)
Albumin: 2.8 g/dL — ABNORMAL LOW (ref 3.5–5.0)
Alkaline Phosphatase: 90 U/L (ref 38–126)
Anion gap: 12 (ref 5–15)
BUN: 6 mg/dL (ref 6–20)
CO2: 23 mmol/L (ref 22–32)
Calcium: 7.8 mg/dL — ABNORMAL LOW (ref 8.9–10.3)
Chloride: 102 mmol/L (ref 98–111)
Creatinine, Ser: 0.62 mg/dL (ref 0.61–1.24)
GFR calc Af Amer: >60 mL/min (ref >60)
GFR calc non Af Amer: >60 mL/min (ref >60)
Glucose, Bld: 86 mg/dL (ref 70–99)
Potassium: 2.9 mmol/L — ABNORMAL LOW (ref 3.5–5.1)
Sodium: 137 mmol/L (ref 135–145)
Total Bilirubin: 1.1 mg/dL (ref 0.3–1.2)
Total Protein: 5.7 g/dL — ABNORMAL LOW (ref 6.5–8.1)

## 2019-01-27 LAB — SARS CORONAVIRUS 2 (TAT 6-24 HRS): SARS Coronavirus 2: NEGATIVE

## 2019-01-27 LAB — PREPARE RBC (CROSSMATCH)

## 2019-01-27 LAB — ABO/RH: ABO/RH(D): A POS

## 2019-01-27 LAB — PROTIME-INR
INR: 1.1 (ref 0.8–1.2)
Prothrombin Time: 14.5 seconds (ref 11.4–15.2)

## 2019-01-27 LAB — CBC
HCT: 21.3 % — ABNORMAL LOW (ref 39.0–52.0)
Hemoglobin: 7.2 g/dL — ABNORMAL LOW (ref 13.0–17.0)
MCH: 36.5 pg — ABNORMAL HIGH (ref 26.0–34.0)
MCHC: 33.8 g/dL (ref 30.0–36.0)
MCV: 108.1 fL — ABNORMAL HIGH (ref 80.0–100.0)
Platelets: 71 10*3/uL — ABNORMAL LOW (ref 150–400)
RBC: 1.97 MIL/uL — ABNORMAL LOW (ref 4.22–5.81)
RDW: 11.9 % (ref 11.5–15.5)
WBC: 8.9 10*3/uL (ref 4.0–10.5)
nRBC: 0 % (ref 0.0–0.2)

## 2019-01-27 LAB — MAGNESIUM: Magnesium: 1.9 mg/dL (ref 1.7–2.4)

## 2019-01-27 LAB — HEMOGLOBIN
Hemoglobin: 7.5 g/dL — ABNORMAL LOW (ref 13.0–17.0)
Hemoglobin: 9.3 g/dL — ABNORMAL LOW (ref 13.0–17.0)

## 2019-01-27 LAB — APTT: aPTT: 35 seconds (ref 24–36)

## 2019-01-27 LAB — PHOSPHORUS: Phosphorus: 2.4 mg/dL — ABNORMAL LOW (ref 2.5–4.6)

## 2019-01-27 LAB — VITAMIN B12: Vitamin B-12: 698 pg/mL (ref 180–914)

## 2019-01-27 MED ORDER — K PHOS MONO-SOD PHOS DI & MONO 155-852-130 MG PO TABS
500.0000 mg | ORAL_TABLET | Freq: Two times a day (BID) | ORAL | Status: AC
  Administered 2019-01-27 (×2): 500 mg via ORAL
  Filled 2019-01-27 (×2): qty 2

## 2019-01-27 MED ORDER — SODIUM CHLORIDE 0.9% IV SOLUTION
Freq: Once | INTRAVENOUS | Status: AC
  Administered 2019-01-27: 13:00:00 via INTRAVENOUS

## 2019-01-27 MED ORDER — FUROSEMIDE 10 MG/ML IJ SOLN
20.0000 mg | Freq: Once | INTRAMUSCULAR | Status: AC
  Administered 2019-01-27: 20 mg via INTRAVENOUS
  Filled 2019-01-27: qty 2

## 2019-01-27 MED ORDER — POTASSIUM CHLORIDE CRYS ER 20 MEQ PO TBCR
40.0000 meq | EXTENDED_RELEASE_TABLET | Freq: Two times a day (BID) | ORAL | Status: AC
  Administered 2019-01-27 (×2): 40 meq via ORAL
  Filled 2019-01-27 (×3): qty 2

## 2019-01-27 NOTE — Progress Notes (Signed)
PROGRESS NOTE    Trevor Williams  [id number] DOB: 1960-10-22 DOA: 01/26/2019 PCP: Marliss Coots, NP   Brief Narrative:  HPI per Dr. Darien Ramus on 01/26/2019 Trevor Williams is a 58 y.o. male with medical history significant of alcohol abuse, COPD, tobacco abuse, and lung cancer who presents for multiple falls with generalized weakness.  He states that over the last 2 to 3 weeks he has noticed increasing weakness as well as lightheadedness and dizziness.  He describes the weakness as severe.  He is unable to identify any exacerbating or remitting factors.  He admits to associated dark and tarry stools.  He also admits to associated shortness of breath however denies any chest pain.  He denies any syncopal events despite the multiple falls.  He denies any injuries from the falls.  He reports no similar events in the past.  Of note, the patient is homeless.  He denies any fever, chills, nausea, vomiting, abdominal pain, dysuria.  ED Course: In the ED the patient was found to be mildly hypotensive however responded to IV fluid.  He was also found to be severely hypokalemic and hypomagnesemic and received replacement for both of these.  GI was called and consulted with no acute intervention required overnight.  **Interim History Gastroenterology was consulted and recommending EGD tomorrow.  Patient was transfused 1 unit of PRBCs and was continued on Protonix drip.   Assessment & Plan:   Active Problems:   GI bleed   Alcohol use   Homeless   Hypokalemia   Hypotensive episode   Fall at home, initial encounter  GI bleed with Melena Macrocytic Anemia -In the setting of alcoholism and possibly gastritis versus peptic ulcer disease -Placed on a Protonix drip and will continue for now  -Gastroenterology was consulted for further evaluation recommendations -GI recommending full liquid diet today and n.p.o. tomorrow for an EGD -Continue to monitor H&H's and transfuse to keep hemoglobin around  7-8 but given his cardiomyopathy will give him 1 unit of blood given that his hemoglobin was last at 7.5 -The hemoglobin/hematocrit prior to the 7.5 value was 7.2/21.3 -Continue to monitor for signs and symptoms of bleeding -Patient's vitamin B12 level was 698 -Repeat CBC in the a.m.  Alcohol Abuse with concern for Withdrawal -Placed on CIWA protocol with lorazepam 1 to 4 mg every 1 hour as needed for withdrawal symptoms and will also continue with folic acid 1 mg p.o. daily, multivitamin plus minerals 1 tab p.o. daily, and thiamine 100 hours p.o./IV -Continue to monitor CIWA scores and evidence for withdrawal  Homeless -Consulting social work for assistance  Abnormal LFTs specifically elevated AST -In the setting of alcoholism -Gastroenterology is checking a right upper quadrant ultrasound -Patient AST went from 111 is 100 and ALT is normal at 32 -We will obtain an acute hepatitis panel in the a.m. -Continue monitor and trend hepatic function -Repeat CMP in a.m.  Nonischemic cardiomyopathy with history of biventricular heart failure of mild CAD History of mitral regurgitation and history of tricuspid regurgitation -Nonischemic cardiomyopathy likely in the setting of alcoholism -Has a history an EF of 15 to 20% -Currently holding his cardiac medications including carvedilol 3.125 mg p.o. twice daily, Entresto 24-26 1 tab p.o. twice daily -Have resumed rosuvastatin 10 mg p.o. nightly -Continue to monitor for signs and symptoms of volume overload -Will be given a 1 dose time of Lasix 20 mg IV after his blood transfusion and will on the blood slowly  Hypotension but has a Hx  of Hypertension -Patient was hypotensive in the ED and responded well to IV fluid.  -We will not continue any further IV fluid hydration but will continue Protonix drip -Patient on antihypertensives at home including carvedilol 3.125 mg p.o. twice daily and Entresto 1 tab p.o. twice daily and holding it is due to  his episode of hypotension. -Since he is getting a unit of blood will give one-time dose of Lasix 20 mg  Hypokalemia -Patient's potassium this morning was 2.9 -Replete with p.o. potassium chloride 40 mEq twice daily x2 doses along with p.o. K-Phos Neutral 500 mg p.o. twice daily x2 doses -Continue to monitor and replete as necessary -Repeat CMP in a.m.  Hypophosphatemia -Patient's phosphorus this morning was 2.4 -Replete with p.o. K-Phos Neutral 500 mg p.o. twice daily x2 doses -Continue to monitor and replete as necessary -Repeat phosphorus level in a.m.  History of emphysema of the lungs in the setting of tobacco abuse -Has a barrel chest likely consistent with COPD -Continue with nicotine 21 mg transdermal patch every 24 hours  DVT prophylaxis: SCDs Code Status: FULL CODE Family Communication: No family present at beside Disposition Plan: Pending further GI workup and evaluation with EGD in AM. Will need social work assistance given his Homelessness to help with Discharge Disposition  Consultants:   Gastroenterology   Procedures:  EGD in AM   Antimicrobials:  Anti-infectives (From admission, onward)   None     Subjective: And examined at bedside and states that he is doing okay and states he is doing little better and that he got to eat now.  No nausea or vomiting and has not any more bleeding so far.  No chest pain, lightheadedness or dizziness.  No other concerns or complaints at this time.  Objective: Vitals:   01/27/19 1230 01/27/19 1240 01/27/19 1315 01/27/19 1544  BP: 103/73 101/71 108/71 126/80  Pulse: 79 81 81 82  Resp: 16 16 16 17   Temp:  98.6 F (37 C)    TempSrc:  Oral Oral   SpO2: 100% 100% 100% 100%    Intake/Output Summary (Last 24 hours) at 01/27/2019 1631 Last data filed at 01/27/2019 1544 Gross per 24 hour  Intake 2260.85 ml  Output 200 ml  Net 2060.85 ml   There were no vitals filed for this visit.  Examination: Physical Exam:   Constitutional: Thin disheveled Caucasian male in NAD and appears calm  Eyes: Lids and conjunctivae normal, sclerae anicteric  ENMT: External Ears, Nose appear normal. Grossly normal hearing.  Neck: Appears normal, supple, no cervical masses, normal ROM, no appreciable thyromegaly; no JVD Respiratory: Diminished to auscultation bilaterally, no wheezing, rales, rhonchi or crackles. Normal respiratory effort and patient is not tachypenic. No accessory muscle use.  Unlabored breathing but has a barrel chest Cardiovascular: RRR, no murmurs / rubs / gallops. S1 and S2 auscultated. No extremity edema. Abdomen: Soft, mildly-tender, non-distended.  Bowel sounds positive x4.  GU: Deferred. Musculoskeletal: No clubbing / cyanosis of digits/nails. No joint deformity upper and lower extremities.   Skin: No rashes, lesions, ulcers on limited skin evaluation but has some tattoos. No induration; Warm and dry.  Neurologic: CN 2-12 grossly intact with no focal deficits. Romberg sign and cerebellar reflexes not assessed.  Psychiatric: Normal judgment and insight. Alert and oriented x 3. Normal mood and appropriate affect.   Data Reviewed: I have personally reviewed following labs and imaging studies  CBC: Recent Labs  Lab 01/26/19 1535 01/26/19 2045 01/27/19 0154 01/27/19 0757  WBC  9.9 9.2 8.9  --   NEUTROABS 7.3  --   --   --   HGB 8.2* 7.6* 7.2* 7.5*  HCT 23.8* 21.9* 21.3*  --   MCV 106.3* 106.8* 108.1*  --   PLT 87* 76* 71*  --    Basic Metabolic Panel: Recent Labs  Lab 01/26/19 1535 01/26/19 2045 01/27/19 0154 01/27/19 0932  NA 139 136 137  --   K 2.3* 2.4* 2.9*  --   CL 98 102 102  --   CO2 22 24 23   --   GLUCOSE 91 93 86  --   BUN 7 6 6   --   CREATININE 0.66 0.60* 0.62  --   CALCIUM 8.2* 7.8* 7.8*  --   MG 1.6* 2.0  --  1.9  PHOS  --  2.3*  --  2.4*   GFR: CrCl cannot be calculated (Unknown ideal weight.). Liver Function Tests: Recent Labs  Lab 01/26/19 1535 01/26/19 2045  01/27/19 0154  AST 117* 111* 100*  ALT 33 34 32  ALKPHOS 99 93 90  BILITOT 1.4* 1.1 1.1  PROT 6.4* 6.0* 5.7*  ALBUMIN 3.2* 2.9* 2.8*   Recent Labs  Lab 01/26/19 1535  LIPASE 75*   Recent Labs  Lab 01/26/19 1535  AMMONIA 40*   Coagulation Profile: Recent Labs  Lab 01/26/19 1735 01/27/19 0154  INR 1.1 1.1   Cardiac Enzymes: No results for input(s): CKTOTAL, CKMB, CKMBINDEX, TROPONINI in the last 168 hours. BNP (last 3 results) No results for input(s): PROBNP in the last 8760 hours. HbA1C: No results for input(s): HGBA1C in the last 72 hours. CBG: No results for input(s): GLUCAP in the last 168 hours. Lipid Profile: No results for input(s): CHOL, HDL, LDLCALC, TRIG, CHOLHDL, LDLDIRECT in the last 72 hours. Thyroid Function Tests: No results for input(s): TSH, T4TOTAL, FREET4, T3FREE, THYROIDAB in the last 72 hours. Anemia Panel: Recent Labs    01/27/19 0931  VITAMINB12 698   Sepsis Labs: No results for input(s): PROCALCITON, LATICACIDVEN in the last 168 hours.  Recent Results (from the past 240 hour(s))  SARS CORONAVIRUS 2 (TAT 6-24 HRS) Nasopharyngeal Nasopharyngeal Swab     Status: None   Collection Time: 01/26/19  5:08 PM   Specimen: Nasopharyngeal Swab  Result Value Ref Range Status   SARS Coronavirus 2 NEGATIVE NEGATIVE Final    Comment: (NOTE) SARS-CoV-2 target nucleic acids are NOT DETECTED. The SARS-CoV-2 RNA is generally detectable in upper and lower respiratory specimens during the acute phase of infection. Negative results do not preclude SARS-CoV-2 infection, do not rule out co-infections with other pathogens, and should not be used as the sole basis for treatment or other patient management decisions. Negative results must be combined with clinical observations, patient history, and epidemiological information. The expected result is Negative. Fact Sheet for Patients: SugarRoll[employer]be Fact Sheet for Healthcare  Providers: https://www.woods-mathews.com/ This test is not yet approved or cleared by the [place] FDA and  has been authorized for detection and/or diagnosis of SARS-CoV-2 by FDA under an Emergency Use Authorization (EUA). This EUA will remain  in effect (meaning this test can be used) for the duration of the COVID-19 declaration under Section 56 4(b)(1) of the Act, 21 U.S.C. section 360bbb-3(b)(1), unless the authorization is terminated or revoked sooner. Performed at [facility], [place] [address] [place], [place] [address]     Radiology Studies: Dg Chest 2 View  Result Date: 01/26/2019 CLINICAL DATA:  Fall EXAM: CHEST -  2 VIEW COMPARISON:  12/10/2018 FINDINGS: The heart size and mediastinal contours are within normal limits. Pulmonary hyperinflation and emphysema. Disc degenerative disease of the thoracic spine. Multiple nonacute left-sided rib fractures. IMPRESSION: Emphysema without acute abnormality of the lungs. Electronically Signed   By: Eddie Candle M.D.   On: 01/26/2019 16:43   Ct Head Wo Contrast  Result Date: 01/26/2019 CLINICAL DATA:  Headache, post-traumatic, fall 2 days ago EXAM: CT HEAD WITHOUT CONTRAST CT CERVICAL SPINE WITHOUT CONTRAST TECHNIQUE: Multidetector CT imaging of the head and cervical spine was performed following the standard protocol without intravenous contrast. Multiplanar CT image reconstructions of the cervical spine were also generated. COMPARISON:  09/07/2016 FINDINGS: CT HEAD FINDINGS Brain: No evidence of acute infarction, hemorrhage, hydrocephalus, extra-axial collection or mass lesion/mass effect. Redemonstrated right MCA territory encephalomalacia. Periventricular white matter hypodensity and volume loss, increased compared to prior examination. Vascular: No hyperdense vessel or unexpected calcification. Skull: Normal. Negative for fracture or focal lesion. Sinuses/Orbits: No acute finding. Other: None. CT CERVICAL SPINE FINDINGS  Alignment: Normal. Skull base and vertebrae: No acute fracture. No primary bone lesion or focal pathologic process. Soft tissues and spinal canal: No prevertebral fluid or swelling. No visible canal hematoma. Disc levels: Moderate multilevel disc space height loss and osteophytosis. Upper chest: Emphysema in the lung apices. Other: None. IMPRESSION: 1.  No acute intracranial pathology. 2. Redemonstrated right MCA territory encephalomalacia, in keeping with prior infarction. 3. Small-vessel white matter disease and global volume loss, notably increased compared to prior examination dated 09/07/2016. 4. No fracture or static subluxation of the cervical spine. Moderate multilevel disc degenerative disease and osteophytosis. 5.  Emphysema (ICD10-J43.9). Electronically Signed   By: Eddie Candle M.D.   On: 01/26/2019 16:43   Ct Cervical Spine Wo Contrast  Result Date: 01/26/2019 CLINICAL DATA:  Headache, post-traumatic, fall 2 days ago EXAM: CT HEAD WITHOUT CONTRAST CT CERVICAL SPINE WITHOUT CONTRAST TECHNIQUE: Multidetector CT imaging of the head and cervical spine was performed following the standard protocol without intravenous contrast. Multiplanar CT image reconstructions of the cervical spine were also generated. COMPARISON:  09/07/2016 FINDINGS: CT HEAD FINDINGS Brain: No evidence of acute infarction, hemorrhage, hydrocephalus, extra-axial collection or mass lesion/mass effect. Redemonstrated right MCA territory encephalomalacia. Periventricular white matter hypodensity and volume loss, increased compared to prior examination. Vascular: No hyperdense vessel or unexpected calcification. Skull: Normal. Negative for fracture or focal lesion. Sinuses/Orbits: No acute finding. Other: None. CT CERVICAL SPINE FINDINGS Alignment: Normal. Skull base and vertebrae: No acute fracture. No primary bone lesion or focal pathologic process. Soft tissues and spinal canal: No prevertebral fluid or swelling. No visible canal  hematoma. Disc levels: Moderate multilevel disc space height loss and osteophytosis. Upper chest: Emphysema in the lung apices. Other: None. IMPRESSION: 1.  No acute intracranial pathology. 2. Redemonstrated right MCA territory encephalomalacia, in keeping with prior infarction. 3. Small-vessel white matter disease and global volume loss, notably increased compared to prior examination dated 09/07/2016. 4. No fracture or static subluxation of the cervical spine. Moderate multilevel disc degenerative disease and osteophytosis. 5.  Emphysema (ICD10-J43.9). Electronically Signed   By: Eddie Candle M.D.   On: 01/26/2019 16:43   US Abdomen Limited Ruq  Result Date: 01/27/2019 CLINICAL DATA:  Abnormal LFTs. EXAM: ULTRASOUND ABDOMEN LIMITED RIGHT UPPER QUADRANT COMPARISON:  Abdomen 07/30/2018. FINDINGS: Gallbladder: Gallbladder sludge noted. Gallbladder wall thickness up to 7.5 mm. Prominent soft tissue edema noted about the gallbladder. Mild pericholecystic fluid collection cannot be excluded. Findings suggest the possibility of cholecystitis.  Common bile duct: Diameter: 5.1 mm Liver: Increase hepatic echogenicity consistent fatty infiltration or hepatocellular disease. Liver appears prominent. Hepatomegaly cannot be excluded. Portal vein is patent on color Doppler imaging with normal direction of blood flow towards the liver. Other: Mild ascites cannot be excluded. IMPRESSION: 1. Gallbladder sludge noted. Gallbladder wall thickness of 7.5 mm. Prominent soft tissue edema noted about the gallbladder. Mild pericholecystic fluid collection cannot be excluded. Findings suggest gallbladder sludge with cholecystitis. 2. Increased hepatic echogenicity consistent fatty infiltration or hepatocellular disease. Hepatomegaly cannot be excluded. 3.  Mild ascites cannot be excluded. Electronically Signed   By: [place]   On: 01/27/2019 10:15   Scheduled Meds: . folic acid  1 mg Oral Daily  . multivitamin with minerals   1 tablet Oral Daily  . nicotine  21 mg Transdermal Daily  . phosphorus  500 mg Oral BID  . potassium chloride  40 mEq Oral BID  . rosuvastatin  10 mg Oral q1800  . sodium chloride flush  3 mL Intravenous Q12H  . sodium chloride flush  3 mL Intravenous Q12H  . thiamine  100 mg Oral Daily   Or  . thiamine  100 mg Intravenous Daily   Continuous Infusions: . sodium chloride    . pantoprozole (PROTONIX) infusion 8 mg/hr (01/27/19 1430)    LOS: 1 day    Kerney Elbe, DO [facility] PAGER is on [place]  If 7PM-7AM, please contact night-coverage [email or web address] Password [email or web address] 01/27/2019, 4:31 PM

## 2019-01-27 NOTE — Consult Note (Signed)
Referring Provider: EDP Primary Care Physician:  Marliss Coots, NP Primary Gastroenterologist: Althia Forts  Reason for Consultation: GI bleed  HPI: Trevor Williams is a 58 y.o. male with past medical history of cardiomyopathy with EF of 15 to 20% as of May 2020, homeless, history of alcohol abuse, tobacco abuse, COPD and history of falls presented to the hospital with multiple falls and generalized weakness.  Upon initial evaluation yesterday, he was found to have anemia with hemoglobin of 8.2, platelet count of 87, positive blood alcohol level, mild elevated lipase at 75.  FOBT negative.  Normal INR.  AST 117 otherwise normal LFTs.  GI is consulted for further evaluation.   Patient seen and examined at bedside.  He is complaining of intermittent black tarry stool for last 2 weeks.  Also having multiple bowel movement for last 1 to 2 weeks.  Complaining of nausea and nonbloody vomiting.  Denies any dysphagia or odynophagia.  He continues to drink alcohol.  Denies generalized abdominal discomfort.  Colonoscopy in January 2017 at [facility] showed 3 mm ascending colon polyp and small 4 to 5 mm submucosal lipoma in the ascending colon.  Pathology showed hyperplastic polyp.  Repeat colonoscopy was recommended in 10 years.  Past Medical History:  Diagnosis Date  . Abnormal liver function tests   . Acute respiratory failure ([place])    a. 07/2018 requiring intubation - CAP/CHF.  Marland Kitchen Alcohol abuse   . Biventricular heart failure ([place])   . Cancer ([place])    skin (left hand)  . Delirium    a. h/o delirium while admitted  . Emphysema of lung ([place])   . Homelessness   . Macrocytic anemia   . Mild CAD    a. cath 07/2018 60% ostial ramus otherwise OK.  . Mitral regurgitation   . NICM (nonischemic cardiomyopathy) ([place])   . Protein calorie malnutrition ([place])   . Tricuspid regurgitation     Past Surgical History:  Procedure Laterality Date  . RIGHT/LEFT HEART CATH AND CORONARY ANGIOGRAPHY N/A 08/09/2018    Procedure: RIGHT/LEFT HEART CATH AND CORONARY ANGIOGRAPHY;  Surgeon: Larey Dresser, MD;  Location: [place] [facility];  Service: Cardiovascular;  Laterality: N/A;    Prior to Admission medications   Medication Sig Start Date End Date Taking? Authorizing Provider  carvedilol (COREG) 3.125 MG tablet Take 1 tablet (3.125 mg total) by mouth 2 (two) times daily with a meal. Patient not taking: Reported on 01/26/2019 08/12/18   Lavina Hamman, MD  diclofenac sodium (VOLTAREN) 1 % GEL Apply 2 g topically 4 (four) times daily. Patient not taking: Reported on 01/26/2019 11/02/18   Domenic Moras, PA-C  feeding supplement, ENSURE ENLIVE, (ENSURE ENLIVE) LIQD Take 237 mLs by mouth 3 (three) times daily between meals. Patient not taking: Reported on 01/26/2019 08/12/18   Lavina Hamman, MD  folic acid (FOLVITE) 1 MG tablet Take 1 tablet (1 mg total) by mouth daily. Patient not taking: Reported on 01/26/2019 08/12/18   Lavina Hamman, MD  nicotine (NICODERM CQ - DOSED IN MG/24 HOURS) 21 mg/24hr patch Place 1 patch (21 mg total) onto the skin daily. Patient not taking: Reported on 01/26/2019 08/12/18   Lavina Hamman, MD  predniSONE (DELTASONE) 10 MG tablet Take 4 tablets (40 mg total) by mouth daily. Patient not taking: Reported on 01/26/2019 12/10/18   Fredia Sorrow, MD  rosuvastatin (CRESTOR) 10 MG tablet Take 1 tablet (10 mg total) by mouth daily at 6 PM. Patient not taking: Reported on 01/26/2019  08/12/18   Lavina Hamman, MD  sacubitril-valsartan (ENTRESTO) 24-26 MG Take 1 tablet by mouth 2 (two) times daily. Patient not taking: Reported on 01/26/2019 08/12/18   Lavina Hamman, MD  thiamine 100 MG tablet Take 1 tablet (100 mg total) by mouth daily. Patient not taking: Reported on 01/26/2019 08/12/18   Lavina Hamman, MD    Scheduled Meds: . folic acid  1 mg Oral Daily  . multivitamin with minerals  1 tablet Oral Daily  . nicotine  21 mg Transdermal Daily  . rosuvastatin  10 mg Oral q1800  . sodium  chloride flush  3 mL Intravenous Q12H  . sodium chloride flush  3 mL Intravenous Q12H  . thiamine  100 mg Oral Daily   Or  . thiamine  100 mg Intravenous Daily   Continuous Infusions: . sodium chloride    . pantoprozole (PROTONIX) infusion 8 mg/hr (01/27/19 0432)   PRN Meds:.sodium chloride, LORazepam **OR** LORazepam, sodium chloride flush, traMADol  Allergies as of 01/26/2019 - Review Complete 01/26/2019  Allergen Reaction Noted  . Doxycycline Rash 08/02/2018  . Ibuprofen Rash 03/12/2017  . Rocephin [ceftriaxone] Rash 08/02/2018  . Tylenol [acetaminophen] Rash 03/12/2017  . Vancomycin Rash 08/02/2018    Family History  Problem Relation Age of Onset  . Hypertension Maternal Grandfather     Social History   Socioeconomic History  . Marital status: Single    Spouse name: Not on file  . Number of children: Not on file  . Years of education: Not on file  . Highest education level: Not on file  Occupational History  . Not on file  Social Needs  . Financial resource strain: Not on file  . Food insecurity    Worry: Not on file    Inability: Not on file  . Transportation needs    Medical: Not on file    Non-medical: Not on file  Tobacco Use  . Smoking status: Current Every Day Smoker    Packs/day: 1.00    Types: Cigarettes  . Smokeless tobacco: Current User  Substance and Sexual Activity  . Alcohol use: Yes    Comment: Liquor daily - 1/2 of a fifth   . Drug use: No  . Sexual activity: Not on file  Lifestyle  . Physical activity    Days per week: Not on file    Minutes per session: Not on file  . Stress: Not on file  Relationships  . Social Herbalist on phone: Not on file    Gets together: Not on file    Attends religious service: Not on file    Active member of club or organization: Not on file    Attends meetings of clubs or organizations: Not on file    Relationship status: Not on file  . Intimate partner violence    Fear of current or ex  partner: Not on file    Emotionally abused: Not on file    Physically abused: Not on file    Forced sexual activity: Not on file  Other Topics Concern  . Not on file  Social History Narrative  . Not on file    Review of Systems: Review of Systems  Constitutional: Positive for malaise/fatigue. Negative for chills and fever.  HENT: Negative for hearing loss and tinnitus.   Eyes: Negative for blurred vision and double vision.  Respiratory: Positive for shortness of breath. Negative for cough.   Cardiovascular: Negative for chest pain and palpitations.  Gastrointestinal: Positive for abdominal pain, heartburn, melena, nausea and vomiting.  Genitourinary: Negative for dysuria and urgency.  Musculoskeletal: Positive for falls and joint pain.  Skin: Negative for itching and rash.  Neurological: Negative for seizures and loss of consciousness.  Endo/Heme/Allergies: Does not bruise/bleed easily.  Psychiatric/Behavioral: Positive for substance abuse. Negative for suicidal ideas.    Physical Exam: Vital signs: Vitals:   01/26/19 2208 01/27/19 0611  BP: 102/66 113/82  Pulse: 84 79  Resp: 18 17  Temp: 98 F (36.7 C) 98.3 F (36.8 C)  SpO2: 100% 99%   Last BM Date: 01/26/19 Physical Exam  Constitutional: He is oriented to person, place, and time. No distress.  Thin appearing patient  HENT:  Head: Normocephalic.  Mouth/Throat: Oropharynx is clear and moist. No oropharyngeal exudate.  Eyes: EOM are normal. No scleral icterus.  Neck: Normal range of motion. Neck supple.  Cardiovascular: Normal rate and regular rhythm.  Pulmonary/Chest: Effort normal. No respiratory distress.  Abdominal: Soft. Bowel sounds are normal. He exhibits no distension. There is no abdominal tenderness. There is no rebound and no guarding.  Musculoskeletal: Normal range of motion.        General: No edema.  Neurological: He is alert and oriented to person, place, and time.  Skin: Skin is dry. No erythema.   Psychiatric: He has a normal mood and affect. Judgment and thought content normal.    GI:  Lab Results: Recent Labs    01/26/19 1535 01/26/19 2045 01/27/19 0154 01/27/19 0757  WBC 9.9 9.2 8.9  --   HGB 8.2* 7.6* 7.2* 7.5*  HCT 23.8* 21.9* 21.3*  --   PLT 87* 76* 71*  --    BMET Recent Labs    01/26/19 1535 01/26/19 2045 01/27/19 0154  NA 139 136 137  K 2.3* 2.4* 2.9*  CL 98 102 102  CO2 22 24 23   GLUCOSE 91 93 86  BUN 7 6 6   CREATININE 0.66 0.60* 0.62  CALCIUM 8.2* 7.8* 7.8*   LFT Recent Labs    01/27/19 0154  PROT 5.7*  ALBUMIN 2.8*  AST 100*  ALT 32  ALKPHOS 90  BILITOT 1.1   PT/INR Recent Labs    01/26/19 1735 01/27/19 0154  LABPROT 14.1 14.5  INR 1.1 1.1     Studies/Results: Dg Chest 2 View  Result Date: 01/26/2019 CLINICAL DATA:  Fall EXAM: CHEST - 2 VIEW COMPARISON:  12/10/2018 FINDINGS: The heart size and mediastinal contours are within normal limits. Pulmonary hyperinflation and emphysema. Disc degenerative disease of the thoracic spine. Multiple nonacute left-sided rib fractures. IMPRESSION: Emphysema without acute abnormality of the lungs. Electronically Signed   By: Eddie Candle M.D.   On: 01/26/2019 16:43   Ct Head Wo Contrast  Result Date: 01/26/2019 CLINICAL DATA:  Headache, post-traumatic, fall 2 days ago EXAM: CT HEAD WITHOUT CONTRAST CT CERVICAL SPINE WITHOUT CONTRAST TECHNIQUE: Multidetector CT imaging of the head and cervical spine was performed following the standard protocol without intravenous contrast. Multiplanar CT image reconstructions of the cervical spine were also generated. COMPARISON:  09/07/2016 FINDINGS: CT HEAD FINDINGS Brain: No evidence of acute infarction, hemorrhage, hydrocephalus, extra-axial collection or mass lesion/mass effect. Redemonstrated right MCA territory encephalomalacia. Periventricular white matter hypodensity and volume loss, increased compared to prior examination. Vascular: No hyperdense vessel or  unexpected calcification. Skull: Normal. Negative for fracture or focal lesion. Sinuses/Orbits: No acute finding. Other: None. CT CERVICAL SPINE FINDINGS Alignment: Normal. Skull base and vertebrae: No acute fracture. No primary bone  lesion or focal pathologic process. Soft tissues and spinal canal: No prevertebral fluid or swelling. No visible canal hematoma. Disc levels: Moderate multilevel disc space height loss and osteophytosis. Upper chest: Emphysema in the lung apices. Other: None. IMPRESSION: 1.  No acute intracranial pathology. 2. Redemonstrated right MCA territory encephalomalacia, in keeping with prior infarction. 3. Small-vessel white matter disease and global volume loss, notably increased compared to prior examination dated 09/07/2016. 4. No fracture or static subluxation of the cervical spine. Moderate multilevel disc degenerative disease and osteophytosis. 5.  Emphysema (ICD10-J43.9). Electronically Signed   By: Eddie Candle M.D.   On: 01/26/2019 16:43   Ct Cervical Spine Wo Contrast  Result Date: 01/26/2019 CLINICAL DATA:  Headache, post-traumatic, fall 2 days ago EXAM: CT HEAD WITHOUT CONTRAST CT CERVICAL SPINE WITHOUT CONTRAST TECHNIQUE: Multidetector CT imaging of the head and cervical spine was performed following the standard protocol without intravenous contrast. Multiplanar CT image reconstructions of the cervical spine were also generated. COMPARISON:  09/07/2016 FINDINGS: CT HEAD FINDINGS Brain: No evidence of acute infarction, hemorrhage, hydrocephalus, extra-axial collection or mass lesion/mass effect. Redemonstrated right MCA territory encephalomalacia. Periventricular white matter hypodensity and volume loss, increased compared to prior examination. Vascular: No hyperdense vessel or unexpected calcification. Skull: Normal. Negative for fracture or focal lesion. Sinuses/Orbits: No acute finding. Other: None. CT CERVICAL SPINE FINDINGS Alignment: Normal. Skull base and vertebrae: No  acute fracture. No primary bone lesion or focal pathologic process. Soft tissues and spinal canal: No prevertebral fluid or swelling. No visible canal hematoma. Disc levels: Moderate multilevel disc space height loss and osteophytosis. Upper chest: Emphysema in the lung apices. Other: None. IMPRESSION: 1.  No acute intracranial pathology. 2. Redemonstrated right MCA territory encephalomalacia, in keeping with prior infarction. 3. Small-vessel white matter disease and global volume loss, notably increased compared to prior examination dated 09/07/2016. 4. No fracture or static subluxation of the cervical spine. Moderate multilevel disc degenerative disease and osteophytosis. 5.  Emphysema (ICD10-J43.9). Electronically Signed   By: Eddie Candle M.D.   On: 01/26/2019 16:43    Impression/Plan: -58 year old patient with past medical history of alcohol abuse presents with GI bleed/melena.  Patient with low platelet counts.  Normal INR.  Most likely ulcer disease versus gastritis from alcohol use.  Less likely to have esophageal varices but remains in differential. -Abnormal LFTs/AST.  Most likely from alcohol use. -Cardiomyopathy with EF of 15 to 20% -Severe hypokalemia.  Currently being replaced  Recommendations ------------------------ -Replace potassium -Monitor H&H.  Transfuse to keep hemoglobin around 7-8 -Check ultrasound right upper quadrant to rule out cirrhosis. -Plan for EGD tomorrow.  Okay to have full liquid diet today.  N.p.o. past midnight  Risks (bleeding, infection, bowel perforation that could require surgery, sedation-related changes in cardiopulmonary systems), benefits (identification and possible treatment of source of symptoms, exclusion of certain causes of symptoms), and alternatives (watchful waiting, radiographic imaging studies, empiric medical treatment)  were explained to patient in detail and patient wishes to proceed.     LOS: 1 day   Otis Brace  MD,  FACP 01/27/2019, 8:35 AM  Contact #  [phone]

## 2019-01-28 ENCOUNTER — Encounter (HOSPITAL_COMMUNITY): Payer: Self-pay

## 2019-01-28 ENCOUNTER — Inpatient Hospital Stay (HOSPITAL_COMMUNITY): Payer: Medicaid Other | Admitting: Registered Nurse

## 2019-01-28 ENCOUNTER — Encounter (HOSPITAL_COMMUNITY)
Admission: EM | Disposition: A | Discharge status: Home / Self Care | Payer: Self-pay | Source: Home / Self Care | Attending: Internal Medicine

## 2019-01-28 DIAGNOSIS — K2920 Alcoholic gastritis without bleeding: Secondary | ICD-10-CM

## 2019-01-28 HISTORY — PX: BIOPSY: SHX5522

## 2019-01-28 HISTORY — PX: ESOPHAGOGASTRODUODENOSCOPY (EGD) WITH PROPOFOL: SHX5813

## 2019-01-28 LAB — HEMOGLOBIN
Hemoglobin: 8.8 g/dL — ABNORMAL LOW (ref 13.0–17.0)
Hemoglobin: 9.8 g/dL — ABNORMAL LOW (ref 13.0–17.0)
Hemoglobin: 8.4 g/dL — ABNORMAL LOW (ref 13.0–17.0)

## 2019-01-28 LAB — CBC WITH DIFFERENTIAL/PLATELET
Abs Immature Granulocytes: 0.05 10*3/uL (ref 0.00–0.07)
Basophils Absolute: 0.1 10*3/uL (ref 0.0–0.1)
Basophils Relative: 1 %
Eosinophils Absolute: 0.2 10*3/uL (ref 0.0–0.5)
Eosinophils Relative: 2 %
HCT: 25.3 % — ABNORMAL LOW (ref 39.0–52.0)
Hemoglobin: 8.7 g/dL — ABNORMAL LOW (ref 13.0–17.0)
Immature Granulocytes: 1 %
Lymphocytes Relative: 19 %
Lymphs Abs: 1.6 10*3/uL (ref 0.7–4.0)
MCH: 35.7 pg — ABNORMAL HIGH (ref 26.0–34.0)
MCHC: 34.4 g/dL (ref 30.0–36.0)
MCV: 103.7 fL — ABNORMAL HIGH (ref 80.0–100.0)
Monocytes Absolute: 1 10*3/uL (ref 0.1–1.0)
Monocytes Relative: 12 %
Neutro Abs: 5.6 10*3/uL (ref 1.7–7.7)
Neutrophils Relative %: 65 %
Platelets: 61 10*3/uL — ABNORMAL LOW (ref 150–400)
RBC: 2.44 MIL/uL — ABNORMAL LOW (ref 4.22–5.81)
RDW: 15 % (ref 11.5–15.5)
WBC: 8.5 10*3/uL (ref 4.0–10.5)
nRBC: 0 % (ref 0.0–0.2)

## 2019-01-28 LAB — COMPREHENSIVE METABOLIC PANEL
ALT: 29 U/L (ref 0–44)
AST: 84 U/L — ABNORMAL HIGH (ref 15–41)
Albumin: 2.7 g/dL — ABNORMAL LOW (ref 3.5–5.0)
Alkaline Phosphatase: 104 U/L (ref 38–126)
Anion gap: 9 (ref 5–15)
BUN: 8 mg/dL (ref 6–20)
CO2: 24 mmol/L (ref 22–32)
Calcium: 8.1 mg/dL — ABNORMAL LOW (ref 8.9–10.3)
Chloride: 98 mmol/L (ref 98–111)
Creatinine, Ser: 0.61 mg/dL (ref 0.61–1.24)
GFR calc Af Amer: >60 mL/min (ref >60)
GFR calc non Af Amer: >60 mL/min (ref >60)
Glucose, Bld: 90 mg/dL (ref 70–99)
Potassium: 3.7 mmol/L (ref 3.5–5.1)
Sodium: 131 mmol/L — ABNORMAL LOW (ref 135–145)
Total Bilirubin: 1.5 mg/dL — ABNORMAL HIGH (ref 0.3–1.2)
Total Protein: 5.7 g/dL — ABNORMAL LOW (ref 6.5–8.1)

## 2019-01-28 LAB — BPAM RBC
Blood Product Expiration Date: 202012052359
ISSUE DATE / TIME: 202011091246
Unit Type and Rh: 6200

## 2019-01-28 LAB — TYPE AND SCREEN
ABO/RH(D): A POS
Antibody Screen: NEGATIVE
Unit division: 0

## 2019-01-28 LAB — FOLATE RBC
Folate, Hemolysate: 213 ng/mL
Folate, RBC: 1014 ng/mL (ref >498)
Hematocrit: 21 % — ABNORMAL LOW (ref 37.5–51.0)

## 2019-01-28 LAB — PHOSPHORUS: Phosphorus: 2.4 mg/dL — ABNORMAL LOW (ref 2.5–4.6)

## 2019-01-28 LAB — MAGNESIUM: Magnesium: 1.3 mg/dL — ABNORMAL LOW (ref 1.7–2.4)

## 2019-01-28 SURGERY — ESOPHAGOGASTRODUODENOSCOPY (EGD) WITH PROPOFOL
Anesthesia: Monitor Anesthesia Care

## 2019-01-28 MED ORDER — PROPOFOL 500 MG/50ML IV EMUL
INTRAVENOUS | Status: DC | PRN
  Administered 2019-01-28: 170 ug/kg/min via INTRAVENOUS

## 2019-01-28 MED ORDER — PROPOFOL 500 MG/50ML IV EMUL
INTRAVENOUS | Status: AC
  Filled 2019-01-28: qty 50

## 2019-01-28 MED ORDER — PROPOFOL 10 MG/ML IV BOLUS
INTRAVENOUS | Status: DC | PRN
  Administered 2019-01-28 (×2): 20 mg via INTRAVENOUS

## 2019-01-28 MED ORDER — SODIUM PHOSPHATES 45 MMOLE/15ML IV SOLN
10.0000 mmol | Freq: Once | INTRAVENOUS | Status: AC
  Administered 2019-01-28: 10 mmol via INTRAVENOUS
  Filled 2019-01-28: qty 3.33

## 2019-01-28 MED ORDER — LIDOCAINE 5 % EX OINT
TOPICAL_OINTMENT | Freq: Two times a day (BID) | CUTANEOUS | Status: DC
  Administered 2019-01-28 – 2019-02-01 (×8): via TOPICAL
  Filled 2019-01-28 (×3): qty 35.44

## 2019-01-28 MED ORDER — LACTATED RINGERS IV SOLN
INTRAVENOUS | Status: DC | PRN
  Administered 2019-01-28: 12:00:00 via INTRAVENOUS

## 2019-01-28 MED ORDER — MAGNESIUM SULFATE 50 % IJ SOLN
3.0000 g | Freq: Once | INTRAVENOUS | Status: AC
  Administered 2019-01-28: 3 g via INTRAVENOUS
  Filled 2019-01-28: qty 6

## 2019-01-28 MED ORDER — PANTOPRAZOLE SODIUM 40 MG PO TBEC
40.0000 mg | DELAYED_RELEASE_TABLET | Freq: Two times a day (BID) | ORAL | Status: DC
  Administered 2019-01-28 – 2019-02-02 (×11): 40 mg via ORAL
  Filled 2019-01-28 (×11): qty 1

## 2019-01-28 MED ORDER — POLYETHYLENE GLYCOL 3350 17 G PO PACK
17.0000 g | PACK | Freq: Every day | ORAL | Status: DC
  Administered 2019-01-28 – 2019-01-29 (×2): 17 g via ORAL
  Filled 2019-01-28 (×5): qty 1

## 2019-01-28 MED ORDER — PROPOFOL 10 MG/ML IV BOLUS
INTRAVENOUS | Status: AC
  Filled 2019-01-28: qty 20

## 2019-01-28 MED ORDER — LIDOCAINE 5 % EX OINT: TOPICAL_OINTMENT | Freq: Two times a day (BID) | CUTANEOUS | Status: DC | PRN

## 2019-01-28 SURGICAL SUPPLY — 14 items

## 2019-01-28 NOTE — Brief Op Note (Signed)
01/26/2019 - 01/28/2019  12:26 PM  PATIENT:  Trevor Williams  58 y.o. male  PRE-OPERATIVE DIAGNOSIS:  Melena  POST-OPERATIVE DIAGNOSIS:  gastritis  PROCEDURE:  Procedure(s): ESOPHAGOGASTRODUODENOSCOPY (EGD) WITH PROPOFOL (N/A) BIOPSY  SURGEON:  Surgeon(s) and Role:    * Octa Uplinger, MD - Primary  Findings ---------- -EGD showed lower esophageal ring, diffuse gastritis and mild duodenitis.  No evidence of varices or ulcer disease.  No active bleeding.  Recommendations ------------------------ -Advance diet -Follow biopsy -Change Protonix to p.o. -Monitor H&H -GI will follow  Otis Brace MD, FACP 01/28/2019, 12:27 PM  Contact #  [phone]

## 2019-01-28 NOTE — Progress Notes (Addendum)
[facility] Progress Note  Trevor Williams 58 y.o. 1960/08/09  CC: GI bleed   Subjective: Patient had few episodes of bowel movement today containing dark and brown-colored stool.  Denies any frank blood in stool.  Complaining of rectal burning with bowel movement and complaining of constipation.  ROS : Afebrile.  Negative for chest pain   Objective: Vital signs in last 24 hours: Vitals:   01/28/19 0553 01/28/19 1100  BP: 122/82 110/73  Pulse: 80 87  Resp: 18 15  Temp: 98.7 F (37.1 C) 98.6 F (37 C)  SpO2: 98% 99%    Physical Exam:  General:  Alert, cooperative, no distress,   Head:  Normocephalic, without obvious abnormality, atraumatic  Eyes:  , EOM's intact,   Lungs:   Clear to auscultation bilaterally, respirations unlabored  Heart:  Regular rate and rhythm, S1, S2 normal  Abdomen:   Soft, non-tender, nondistended, bowel sounds present.  Extremities: Extremities normal, atraumatic, no  edema       Lab Results: Recent Labs    01/27/19 0154 01/27/19 0932 01/28/19 0158  NA 137  --  131*  K 2.9*  --  3.7  CL 102  --  98  CO2 23  --  24  GLUCOSE 86  --  90  BUN 6  --  8  CREATININE 0.62  --  0.61  CALCIUM 7.8*  --  8.1*  MG  --  1.9 1.3*  PHOS  --  2.4* 2.4*   Recent Labs    01/27/19 0154 01/28/19 0158  AST 100* 84*  ALT 32 29  ALKPHOS 90 104  BILITOT 1.1 1.5*  PROT 5.7* 5.7*  ALBUMIN 2.8* 2.7*   Recent Labs    01/26/19 1535  01/27/19 0154  01/28/19 0158 01/28/19 0825  WBC 9.9   < > 8.9  --  8.5  --   NEUTROABS 7.3  --   --   --  5.6  --   HGB 8.2*   < > 7.2*   < > 8.7* 8.8*  HCT 23.8*   < > 21.3*  --  25.3*  --   MCV 106.3*   < > 108.1*  --  103.7*  --   PLT 87*   < > 71*  --  61*  --    < > = values in this interval not displayed.   Recent Labs    01/26/19 1735 01/27/19 0154  LABPROT 14.1 14.5  INR 1.1 1.1      Assessment/Plan: -58 year old patient with past medical history of alcohol abuse presents with GI  bleed/melena.  Patient with low platelet counts.  Normal INR.  Most likely ulcer disease versus gastritis from alcohol use.  Less likely to have esophageal varices but remains in differential. -Abnormal LFTs/AST.  Most likely from alcohol use. -Cardiomyopathy with EF of 15 to 20% -Severe hypokalemia.    Resolved.  Recommendations ------------------------ -Ultrasound showed some gallbladder sludge.  It also showed gallbladder wall thickening which could be secondary from mild ascites.  Patient is denying any right upper quadrant abdominal pain.  -Proceed with EGD. -Add MiraLAX for constipation  Risks (bleeding, infection, bowel perforation that could require surgery, sedation-related changes in cardiopulmonary systems), benefits (identification and possible treatment of source of symptoms, exclusion of certain causes of symptoms), and alternatives (watchful waiting, radiographic imaging studies, empiric medical treatment)  were explained to patient in detail and patient wishes to proceed.   Otis Brace MD, FACP 01/28/2019, 11:07 AM  Contact #  [phone]

## 2019-01-28 NOTE — Anesthesia Preprocedure Evaluation (Signed)
Anesthesia Evaluation  Patient identified by MRN, date of birth, ID band Patient awake    Reviewed: Allergy & Precautions, NPO status , Patient's Chart, lab work & pertinent test results  Airway Mallampati: I  TM Distance: >3 FB Neck ROM: Full    Dental  (+) Edentulous Lower, Edentulous Upper   Pulmonary COPD, Current Smoker and Patient abstained from smoking.,    breath sounds clear to auscultation       Cardiovascular + CAD   Rhythm:Regular Rate:Normal     Neuro/Psych negative neurological ROS  negative psych ROS   GI/Hepatic negative GI ROS, Neg liver ROS,   Endo/Other  negative endocrine ROS  Renal/GU negative Renal ROS     Musculoskeletal negative musculoskeletal ROS (+)   Abdominal Normal abdominal exam  (+)   Peds  Hematology   Anesthesia Other Findings   Reproductive/Obstetrics                             Anesthesia Physical Anesthesia Plan  ASA: III  Anesthesia Plan: MAC   Post-op Pain Management:    Induction: Intravenous  PONV Risk Score and Plan: 0 and Propofol infusion  Airway Management Planned: Natural Airway and Nasal Cannula  Additional Equipment: None  Intra-op Plan:   Post-operative Plan:   Informed Consent: I have reviewed the patients History and Physical, chart, labs and discussed the procedure including the risks, benefits and alternatives for the proposed anesthesia with the patient or authorized representative who has indicated his/her understanding and acceptance.       Plan Discussed with: CRNA  Anesthesia Plan Comments:         Anesthesia Quick Evaluation

## 2019-01-28 NOTE — Progress Notes (Signed)
PROGRESS NOTE    Trevor Williams  [id number] DOB: Feb 22, 1961 DOA: 01/26/2019 PCP: Marliss Coots, NP   Brief Narrative:  HPI per Dr. Darien Ramus on 01/26/2019 Trevor Williams is a 58 y.o. male with medical history significant of alcohol abuse, COPD, tobacco abuse, and lung cancer who presents for multiple falls with generalized weakness.  He states that over the last 2 to 3 weeks he has noticed increasing weakness as well as lightheadedness and dizziness.  He describes the weakness as severe.  He is unable to identify any exacerbating or remitting factors.  He admits to associated dark and tarry stools.  He also admits to associated shortness of breath however denies any chest pain.  He denies any syncopal events despite the multiple falls.  He denies any injuries from the falls.  He reports no similar events in the past.  Of note, the patient is homeless.  He denies any fever, chills, nausea, vomiting, abdominal pain, dysuria.  ED Course: In the ED the patient was found to be mildly hypotensive however responded to IV fluid.  He was also found to be severely hypokalemic and hypomagnesemic and received replacement for both of these.  GI was called and consulted with no acute intervention required overnight.  **Interim History Gastroenterology was consulted and recommending EGD which was done today.  Patient was transfused 1 unit of PRBCs and was continued on Protonix drip.  Ultrasound was ordered by gastroenterology for abnormal LFTs and showed some gallbladder sludge and also some fluid, gallbladder wall thickening which could be secondary to mild ascites as patient denied any right upper quadrant abdominal pain.  Dr. Alessandra Bevels added MiraLAX for the patient and did an EGD which showed a lower esophageal ring, gastritis which was diffuse and mild duodenitis but no evidence of any varices or ulcer disease and no active bleeding.  Diet was advanced and biopsies were taken which will need to be followed  on.  GI recommending continue monitor H&H and change Protonix drip to p.o.  Assessment & Plan:   Active Problems:   GI bleed   Alcohol use   Homeless   Hypokalemia   Hypotensive episode   Fall at home, initial encounter  GI bleed with Melena Macrocytic Anemia -In the setting of alcoholism and possibly gastritis versus peptic ulcer disease -Placed on a Protonix drip and this was changed to p.o. Protonix by gastroenterology after EGD findings -Gastroenterology was consulted for further evaluation recommendations -GI recommending advancing diet as tolerated and is now on a soft diet -Continue to monitor H&H's and transfuse to keep hemoglobin around 7-8 but given his cardiomyopathy will give him 1 unit of blood given that his hemoglobin was last at 7.5 -The hemoglobin/hematocrit prior to the 7.5 value was 7.2/21.3; now hemoglobin/hematocrit is improved and was 8.7/25.3 in the last hemoglobin was 9.8 -EGD showed a lower esophageal ring, diffuse gastritis as well as mild duodenitis and biopsies were taken -Further care per gastroenterology; Gastroenterology took biopsies of the gastritis which are still pending -Continue to monitor for signs and symptoms of bleeding -Patient's vitamin B12 level was 698 -Repeat CBC in the a.m.  Alcohol Abuse with concern for Withdrawal -Placed on CIWA protocol with lorazepam 1 to 4 mg every 1 hour as needed for withdrawal symptoms and will also continue with folic acid 1 mg p.o. daily, multivitamin plus minerals 1 tab p.o. daily, and thiamine 100 hours p.o./IV -Continue to monitor CIWA scores and evidence for withdrawal; the last few CIWA's were 0  Homeless -Consulting Social work forAssistance  Abnormal LFTs specifically elevated AST -In the setting of alcoholism -Gastroenterology is checking a right upper quadrant ultrasound -RUQ showed "Gallbladder sludge noted. Gallbladder wall thickness of 7.5 mm.Prominent soft tissue edema noted about the  gallbladder. Mild pericholecystic fluid collection cannot be excluded. Findings suggest gallbladder sludge with cholecystitis. Increased hepatic echogenicity consistent fatty infiltration or hepatocellular disease. Hepatomegaly cannot be excluded.  Mild ascites cannot be excluded." -Patient not having any right upper quadrant abdominal pain and likely can be referred to surgery in outpatient setting for further evaluation -Patient AST went from 111  and is now 84 and ALT is normal at 29 -We will obtain an acute hepatitis panel in the a.m. -Continue monitor and trend hepatic function -Repeat CMP in a.m.  Nonischemic cardiomyopathy with history of biventricular heart failure of mild CAD History of mitral regurgitation and history of tricuspid regurgitation -Nonischemic cardiomyopathy likely in the setting of alcoholism -Has a history an EF of 15 to 20% -Currently holding his cardiac medications including carvedilol 3.125 mg p.o. twice daily, Entresto 24-26 1 tab p.o. twice daily -Have resumed rosuvastatin 10 mg p.o. nightly and will continue -Continue to monitor for signs and symptoms of volume overload -Given a 1 dose time of Lasix 20 mg IV after his blood transfusion  -Continue to monitor for signs and symptoms of volume overload  Hypotension but has a Hx of Hypertension -Patient was hypotensive in the ED and responded well to IV fluid.  -We will not continue any further IV fluid hydration but will continue Protonix drip -Patient on antihypertensives at home including carvedilol 3.125 mg p.o. twice daily and Entresto 1 tab p.o. twice daily and holding it is due to his episode of hypotension likely can be resumed in the a.m. if blood pressure is stable.  Last blood pressure was 93/79 -Since he is getting a unit of blood will give one-time dose of Lasix 20 mg  Hypokalemia -Patient's potassium this morning was 3.7 -Replete with p.o. potassium chloride 40 mEq twice daily x2 doses along with p.o.  K-Phos Neutral 500 mg p.o. twice daily x2 doses yesterday -Continue to monitor and replete as necessary -Repeat CMP in a.m.  Hypophosphatemia -Patient's phosphorus this morning was 2.4 -Replete with p.o. K-Phos Neutral 500 mg p.o. twice daily x2 doses again -Continue to monitor and replete as necessary -Repeat phosphorus level in a.m.  Hypomagnesemia -Patient magnesium level is 1.3 this morning -Replete with IV mag sulfate 3 g -Continue to monitor and replete as necessary -Repeat magnesium level in the a.m.  History of emphysema of the lungs in the setting of tobacco abuse -Has a barrel chest likely consistent with COPD -Continue with nicotine 21 mg transdermal patch every 24 hours  Constipation and rectal pain and burning -May need a steroid suppository or rectal cream -GI starting the patient on MiraLAX  -Continue monitor carefully  Hyperbilirubinemia -Mild as patient's T bili is now 1.5 -LFTs are trending down -Right upper quadrant ultrasound showed some gallbladder sludge but patient is asymptomatic and not complain of any abdominal pain and is afebrile and has no white count -Continue monitor and trend and repeat CMP in a.m.  Hyponatremia -Patient's Na+ was 131 -Continue to Monitor and Repeat  DVT prophylaxis: SCDs Code Status: FULL CODE Family Communication: No family present at beside Disposition Plan: Pending further GI workup and evaluation with EGD in AM. Will need social work assistance given his Homelessness to help with Discharge Disposition  Consultants:  Gastroenterology   Procedures:  EGD  Findings:      A non-obstructing Schatzki ring was found in the distal esophagus.      There is no endoscopic evidence of bleeding, ulcerations or varices in       the entire esophagus.      Diffuse moderate inflammation characterized by congestion (edema),       erythema and friability was found in the entire examined stomach.       Biopsies were taken with a  cold forceps for histology.      The cardia and gastric fundus were normal on retroflexion.      Localized mild inflammation characterized by congestion (edema) and       erythema was found in the duodenal bulb.      The first portion of the duodenum and second portion of the duodenum       were normal.      A small hiatal hernia was present. Impression:               - Non-obstructing Schatzki ring.                           - Gastritis. Biopsied.                           - Duodenitis.                           - Normal first portion of the duodenum and second                            portion of the duodenum.                           - Small hiatal hernia.  Antimicrobials:  Anti-infectives (From admission, onward)   None     Subjective: Seen and examined at bedside and states that he is not doing as well as complaining of buttock pain and rectal pain and burning.  No nausea or vomiting.  Awaiting for EGD to happen.  No other concerns or complaints at this time  Objective: Vitals:   01/28/19 1227 01/28/19 1230 01/28/19 1240 01/28/19 1257  BP: (!) 88/51 (!) 87/56 98/62 93/79   Pulse:  79 73 78  Resp:  19 16 17   Temp:    97.8 F (36.6 C)  TempSrc:    Oral  SpO2:  100% 98% 100%  Weight:      Height:        Intake/Output Summary (Last 24 hours) at 01/28/2019 1730 Last data filed at 01/28/2019 1400 Gross per 24 hour  Intake 1437.12 ml  Output 975 ml  Net 462.12 ml   Filed Weights   01/28/19 0756  Weight: 54 kg   Examination: Physical Exam:  Constitutional: Thin disheveled Caucasian male currently in NAD and appears calm  Eyes: Lids and conjunctivae normal, sclerae anicteric  ENMT: External Ears, Nose appear normal. Grossly normal hearing. Mucous membranes are moist;  Neck: Appears normal, supple, no cervical masses, normal ROM, no appreciable thyromegaly; no JVD Respiratory: Diminished to auscultation bilaterally, no wheezing, rales, rhonchi or crackles. Normal  respiratory effort and patient is not tachypenic. No accessory muscle use.  Unlabored breathing Cardiovascular: RRR, no murmurs / rubs / gallops. S1  and S2 auscultated. No extremity edema.  Abdomen: Soft, non-tender, non-distended. Bowel sounds positive x4.  GU: Deferred. Musculoskeletal: No clubbing / cyanosis of digits/nails. No joint deformity upper and lower extremities. Skin: No rashes, lesions, ulcers but does have some scattered tattoos throughout his body. No induration; Warm and dry.  Neurologic: CN 2-12 grossly intact with no focal deficits. Romberg sign and cerebellar reflexes not assessed.  Psychiatric: Normal judgment and insight. Alert and oriented x 3. Normal mood and appropriate affect.   Data Reviewed: I have personally reviewed following labs and imaging studies  CBC: Recent Labs  Lab 01/26/19 1535 01/26/19 2045 01/27/19 0154 01/27/19 0757 01/27/19 1723 01/28/19 0158 01/28/19 0825 01/28/19 1403  WBC 9.9 9.2 8.9  --   --  8.5  --   --   NEUTROABS 7.3  --   --   --   --  5.6  --   --   HGB 8.2* 7.6* 7.2* 7.5* 9.3* 8.7* 8.8* 9.8*  HCT 23.8* 21.9* 21.3*  --   --  25.3*  --   --   MCV 106.3* 106.8* 108.1*  --   --  103.7*  --   --   PLT 87* 76* 71*  --   --  61*  --   --    Basic Metabolic Panel: Recent Labs  Lab 01/26/19 1535 01/26/19 2045 01/27/19 0154 01/27/19 0932 01/28/19 0158  NA 139 136 137  --  131*  K 2.3* 2.4* 2.9*  --  3.7  CL 98 102 102  --  98  CO2 22 24 23   --  24  GLUCOSE 91 93 86  --  90  BUN 7 6 6   --  8  CREATININE 0.66 0.60* 0.62  --  0.61  CALCIUM 8.2* 7.8* 7.8*  --  8.1*  MG 1.6* 2.0  --  1.9 1.3*  PHOS  --  2.3*  --  2.4* 2.4*   GFR: Estimated Creatinine Clearance: 77.8 mL/min (by C-G formula based on SCr of 0.61 mg/dL). Liver Function Tests: Recent Labs  Lab 01/26/19 1535 01/26/19 2045 01/27/19 0154 01/28/19 0158  AST 117* 111* 100* 84*  ALT 33 34 32 29  ALKPHOS 99 93 90 104  BILITOT 1.4* 1.1 1.1 1.5*  PROT 6.4* 6.0*  5.7* 5.7*  ALBUMIN 3.2* 2.9* 2.8* 2.7*   Recent Labs  Lab 01/26/19 1535  LIPASE 75*   Recent Labs  Lab 01/26/19 1535  AMMONIA 40*   Coagulation Profile: Recent Labs  Lab 01/26/19 1735 01/27/19 0154  INR 1.1 1.1   Cardiac Enzymes: No results for input(s): CKTOTAL, CKMB, CKMBINDEX, TROPONINI in the last 168 hours. BNP (last 3 results) No results for input(s): PROBNP in the last 8760 hours. HbA1C: No results for input(s): HGBA1C in the last 72 hours. CBG: No results for input(s): GLUCAP in the last 168 hours. Lipid Profile: No results for input(s): CHOL, HDL, LDLCALC, TRIG, CHOLHDL, LDLDIRECT in the last 72 hours. Thyroid Function Tests: No results for input(s): TSH, T4TOTAL, FREET4, T3FREE, THYROIDAB in the last 72 hours. Anemia Panel: Recent Labs    01/27/19 0931  VITAMINB12 698   Sepsis Labs: No results for input(s): PROCALCITON, LATICACIDVEN in the last 168 hours.  Recent Results (from the past 240 hour(s))  SARS CORONAVIRUS 2 (TAT 6-24 HRS) Nasopharyngeal Nasopharyngeal Swab     Status: None   Collection Time: 01/26/19  5:08 PM   Specimen: Nasopharyngeal Swab  Result Value Ref Range Status  SARS Coronavirus 2 NEGATIVE NEGATIVE Final    Comment: (NOTE) SARS-CoV-2 target nucleic acids are NOT DETECTED. The SARS-CoV-2 RNA is generally detectable in upper and lower respiratory specimens during the acute phase of infection. Negative results do not preclude SARS-CoV-2 infection, do not rule out co-infections with other pathogens, and should not be used as the sole basis for treatment or other patient management decisions. Negative results must be combined with clinical observations, patient history, and epidemiological information. The expected result is Negative. Fact Sheet for Patients: SugarRoll.be Fact Sheet for Healthcare Providers: https://www.woods-mathews.com/ This test is not yet approved or cleared by the  [place] FDA and  has been authorized for detection and/or diagnosis of SARS-CoV-2 by FDA under an Emergency Use Authorization (EUA). This EUA will remain  in effect (meaning this test can be used) for the duration of the COVID-19 declaration under Section 56 4(b)(1) of the Act, 21 U.S.C. section 360bbb-3(b)(1), unless the authorization is terminated or revoked sooner. Performed at [facility], [place] [address], [place], [place] [address]     Radiology Studies: US Abdomen Limited Ruq  Result Date: 01/27/2019 CLINICAL DATA:  Abnormal LFTs. EXAM: ULTRASOUND ABDOMEN LIMITED RIGHT UPPER QUADRANT COMPARISON:  Abdomen 07/30/2018. FINDINGS: Gallbladder: Gallbladder sludge noted. Gallbladder wall thickness up to 7.5 mm. Prominent soft tissue edema noted about the gallbladder. Mild pericholecystic fluid collection cannot be excluded. Findings suggest the possibility of cholecystitis. Common bile duct: Diameter: 5.1 mm Liver: Increase hepatic echogenicity consistent fatty infiltration or hepatocellular disease. Liver appears prominent. Hepatomegaly cannot be excluded. Portal vein is patent on color Doppler imaging with normal direction of blood flow towards the liver. Other: Mild ascites cannot be excluded. IMPRESSION: 1. Gallbladder sludge noted. Gallbladder wall thickness of 7.5 mm. Prominent soft tissue edema noted about the gallbladder. Mild pericholecystic fluid collection cannot be excluded. Findings suggest gallbladder sludge with cholecystitis. 2. Increased hepatic echogenicity consistent fatty infiltration or hepatocellular disease. Hepatomegaly cannot be excluded. 3.  Mild ascites cannot be excluded. Electronically Signed   By: Marcello Moores  Register   On: 01/27/2019 10:15   Scheduled Meds: . folic acid  1 mg Oral Daily  . lidocaine   Topical BID  . multivitamin with minerals  1 tablet Oral Daily  . nicotine  21 mg Transdermal Daily  . pantoprazole  40 mg Oral BID  . polyethylene glycol   17 g Oral Daily  . rosuvastatin  10 mg Oral q1800  . sodium chloride flush  3 mL Intravenous Q12H  . sodium chloride flush  3 mL Intravenous Q12H  . thiamine  100 mg Oral Daily   Or  . thiamine  100 mg Intravenous Daily   Continuous Infusions: . sodium chloride 250 mL (01/28/19 0929)  . sodium phosphate  Dextrose 5% IVPB 10 mmol (01/28/19 1415)    LOS: 2 days    Kerney Elbe, DO [facility] PAGER is on [place]  If 7PM-7AM, please contact night-coverage [email or web address] Password [email or web address] 01/28/2019, 5:30 PM

## 2019-01-28 NOTE — Plan of Care (Signed)
  Problem: Education: Goal: Knowledge of General Education information will improve Description: Including pain rating scale, medication(s)/side effects and non-pharmacologic comfort measures Outcome: Progressing   Problem: Clinical Measurements: Goal: Ability to maintain clinical measurements within normal limits will improve Outcome: Progressing Goal: Will remain free from infection Outcome: Progressing Goal: Diagnostic test results will improve Outcome: Progressing Goal: Respiratory complications will improve Outcome: Progressing Goal: Cardiovascular complication will be avoided Outcome: Progressing   Problem: Activity: Goal: Risk for activity intolerance will decrease Outcome: Progressing   Problem: Nutrition: Goal: Adequate nutrition will be maintained Outcome: Progressing   Problem: Coping: Goal: Level of anxiety will decrease Outcome: Progressing   Problem: Elimination: Goal: Will not experience complications related to bowel motility Outcome: Progressing Goal: Will not experience complications related to urinary retention Outcome: Progressing   Problem: Pain Managment: Goal: General experience of comfort will improve Outcome: Progressing   Problem: Safety: Goal: Ability to remain free from injury will improve Outcome: Progressing   Problem: Skin Integrity: Goal: Risk for impaired skin integrity will decrease Outcome: Progressing   Problem: Education: Goal: Ability to identify signs and symptoms of gastrointestinal bleeding will improve Outcome: Progressing   Problem: Bowel/Gastric: Goal: Will show no signs and symptoms of gastrointestinal bleeding Outcome: Progressing   Problem: Fluid Volume: Goal: Will show no signs and symptoms of excessive bleeding Outcome: Progressing

## 2019-01-28 NOTE — Transfer of Care (Signed)
Immediate Anesthesia Transfer of Care Note  Patient: Trevor Williams  Procedure(s) Performed: ESOPHAGOGASTRODUODENOSCOPY (EGD) WITH PROPOFOL (N/A ) BIOPSY  Patient Location: PACU and Endoscopy Unit  Anesthesia Type:MAC  Level of Consciousness: awake, alert , oriented and patient cooperative  Airway & Oxygen Therapy: Patient Spontanous Breathing and Patient connected to face mask oxygen  Post-op Assessment: Report given to RN, Post -op Vital signs reviewed and stable and Patient moving all extremities  Post vital signs: Reviewed and stable  Last Vitals:  Vitals Value Taken Time  BP 84/52 01/28/19 1225  Temp    Pulse 83 01/28/19 1226  Resp 18 01/28/19 1226  SpO2 100 % 01/28/19 1226  Vitals shown include unvalidated device data.  Last Pain:  Vitals:   01/28/19 1100  TempSrc: Oral  PainSc: 0-No pain      Patients Stated Pain Goal: 2 (83/09/40 7680)  Complications: No apparent anesthesia complications

## 2019-01-28 NOTE — Progress Notes (Signed)
RN to notify when patient returns to the room for PIV placement

## 2019-01-28 NOTE — Anesthesia Postprocedure Evaluation (Signed)
Anesthesia Post Note  Patient: Trevor Williams  Procedure(s) Performed: ESOPHAGOGASTRODUODENOSCOPY (EGD) WITH PROPOFOL (N/A ) BIOPSY     Patient location during evaluation: PACU Anesthesia Type: MAC Level of consciousness: awake and alert Pain management: pain level controlled Vital Signs Assessment: post-procedure vital signs reviewed and stable Respiratory status: spontaneous breathing, nonlabored ventilation, respiratory function stable and patient connected to nasal cannula oxygen Cardiovascular status: stable and blood pressure returned to baseline Postop Assessment: no apparent nausea or vomiting Anesthetic complications: no    Last Vitals:  Vitals:   01/28/19 1240 01/28/19 1257  BP: 98/62 93/79  Pulse: 73 78  Resp: 16 17  Temp:  36.6 C  SpO2: 98% 100%    Last Pain:  Vitals:   01/28/19 1257  TempSrc: Oral  PainSc:                  Effie Berkshire

## 2019-01-28 NOTE — Op Note (Signed)
[facility] Patient Name: Trevor Williams Procedure Date: 01/28/2019 MRN: [id number] Attending MD: Otis Brace , MD Date of Birth: [birth year] CSN: [id number] Age: 58 Admit Type: Inpatient Procedure:                Upper GI endoscopy Indications:              Melena Providers:                Otis Brace, MD, Cleda Daub, RN, Courtney Heys.                            [place], CRNA, Marguerita Merles, Technician Referring MD:              Medicines:                Sedation Administered by an Anesthesia Professional Complications:            No immediate complications. Estimated Blood Loss:     Estimated blood loss was minimal. Procedure:                Pre-Anesthesia Assessment:                           - Prior to the procedure, a History and Physical                            was performed, and patient medications and                            allergies were reviewed. The patient's tolerance of                            previous anesthesia was also reviewed. The risks                            and benefits of the procedure and the sedation                            options and risks were discussed with the patient.                            All questions were answered, and informed consent                            was obtained. Prior Anticoagulants: The patient has                            taken no previous anticoagulant or antiplatelet                            agents. ASA Grade Assessment: III - A patient with                            severe systemic disease. After reviewing the risks  and benefits, the patient was deemed in                            satisfactory condition to undergo the procedure.                           After obtaining informed consent, the endoscope was                            passed under direct vision. Throughout the                            procedure, the patient's blood pressure, pulse, and                            oxygen saturations were monitored continuously. The                            [id number] Olympus gastroscope was                            introduced through the mouth, and advanced to the                            second part of duodenum. The upper GI endoscopy was                            accomplished without difficulty. The patient                            tolerated the procedure well. Scope In: Scope Out: Findings:      A non-obstructing Schatzki ring was found in the distal esophagus.      There is no endoscopic evidence of bleeding, ulcerations or varices in       the entire esophagus.      Diffuse moderate inflammation characterized by congestion (edema),       erythema and friability was found in the entire examined stomach.       Biopsies were taken with a cold forceps for histology.      The cardia and gastric fundus were normal on retroflexion.      Localized mild inflammation characterized by congestion (edema) and       erythema was found in the duodenal bulb.      The first portion of the duodenum and second portion of the duodenum       were normal.      A small hiatal hernia was present. Impression:               - Non-obstructing Schatzki ring.                           - Gastritis. Biopsied.                           - Duodenitis.                           -  Normal first portion of the duodenum and second                            portion of the duodenum.                           - Small hiatal hernia. Moderate Sedation:      Moderate (conscious) sedation was personally administered by an       anesthesia professional. The following parameters were monitored: oxygen       saturation, heart rate, blood pressure, and response to care. Recommendation:           - Return patient to hospital ward for ongoing care.                           - Soft diet.                           - Continue present medications.                            - Await pathology results. Procedure Code(s):        --- Professional ---                           [phone], Esophagogastroduodenoscopy, flexible,                            transoral; with biopsy, single or multiple Diagnosis Code(s):        --- Professional ---                           K22.2, Esophageal obstruction                           K29.70, Gastritis, unspecified, without bleeding                           K29.80, Duodenitis without bleeding                           K92.1, Melena (includes Hematochezia) CPT copyright 2019 American Medical Association. All rights reserved. The codes documented in this report are preliminary and upon coder review may  be revised to meet current compliance requirements. Otis Brace, MD Otis Brace, MD 01/28/2019 12:25:47 PM Number of Addenda: 0

## 2019-01-29 DIAGNOSIS — Z590 Homelessness: Secondary | ICD-10-CM

## 2019-01-29 DIAGNOSIS — W19XXXA Unspecified fall, initial encounter: Secondary | ICD-10-CM

## 2019-01-29 DIAGNOSIS — E876 Hypokalemia: Secondary | ICD-10-CM

## 2019-01-29 DIAGNOSIS — Z7289 Other problems related to lifestyle: Secondary | ICD-10-CM

## 2019-01-29 DIAGNOSIS — Y92009 Unspecified place in unspecified non-institutional (private) residence as the place of occurrence of the external cause: Secondary | ICD-10-CM

## 2019-01-29 DIAGNOSIS — D62 Acute posthemorrhagic anemia: Secondary | ICD-10-CM

## 2019-01-29 DIAGNOSIS — K922 Gastrointestinal hemorrhage, unspecified: Secondary | ICD-10-CM

## 2019-01-29 LAB — CBC WITH DIFFERENTIAL/PLATELET
Abs Immature Granulocytes: 0.05 10*3/uL (ref 0.00–0.07)
Basophils Absolute: 0.1 10*3/uL (ref 0.0–0.1)
Basophils Relative: 1 %
Eosinophils Absolute: 0.2 10*3/uL (ref 0.0–0.5)
Eosinophils Relative: 2 %
HCT: 24.3 % — ABNORMAL LOW (ref 39.0–52.0)
Hemoglobin: 8.2 g/dL — ABNORMAL LOW (ref 13.0–17.0)
Immature Granulocytes: 1 %
Lymphocytes Relative: 22 %
Lymphs Abs: 1.8 10*3/uL (ref 0.7–4.0)
MCH: 35 pg — ABNORMAL HIGH (ref 26.0–34.0)
MCHC: 33.7 g/dL (ref 30.0–36.0)
MCV: 103.8 fL — ABNORMAL HIGH (ref 80.0–100.0)
Monocytes Absolute: 0.8 10*3/uL (ref 0.1–1.0)
Monocytes Relative: 10 %
Neutro Abs: 5.2 10*3/uL (ref 1.7–7.7)
Neutrophils Relative %: 64 %
Platelets: 64 10*3/uL — ABNORMAL LOW (ref 150–400)
RBC: 2.34 MIL/uL — ABNORMAL LOW (ref 4.22–5.81)
RDW: 14.6 % (ref 11.5–15.5)
WBC: 8 10*3/uL (ref 4.0–10.5)
nRBC: 0 % (ref 0.0–0.2)

## 2019-01-29 LAB — PHOSPHORUS: Phosphorus: 3.1 mg/dL (ref 2.5–4.6)

## 2019-01-29 LAB — HEPATITIS PANEL, ACUTE
HCV Ab: NONREACTIVE
Hep A IgM: NONREACTIVE
Hep B C IgM: NONREACTIVE
Hepatitis B Surface Ag: NONREACTIVE

## 2019-01-29 LAB — COMPREHENSIVE METABOLIC PANEL
ALT: 32 U/L (ref 0–44)
AST: 93 U/L — ABNORMAL HIGH (ref 15–41)
Albumin: 2.6 g/dL — ABNORMAL LOW (ref 3.5–5.0)
Alkaline Phosphatase: 106 U/L (ref 38–126)
Anion gap: 8 (ref 5–15)
BUN: 7 mg/dL (ref 6–20)
CO2: 26 mmol/L (ref 22–32)
Calcium: 8 mg/dL — ABNORMAL LOW (ref 8.9–10.3)
Chloride: 98 mmol/L (ref 98–111)
Creatinine, Ser: 0.65 mg/dL (ref 0.61–1.24)
GFR calc Af Amer: >60 mL/min (ref >60)
GFR calc non Af Amer: >60 mL/min (ref >60)
Glucose, Bld: 94 mg/dL (ref 70–99)
Potassium: 3.9 mmol/L (ref 3.5–5.1)
Sodium: 132 mmol/L — ABNORMAL LOW (ref 135–145)
Total Bilirubin: 1 mg/dL (ref 0.3–1.2)
Total Protein: 5.6 g/dL — ABNORMAL LOW (ref 6.5–8.1)

## 2019-01-29 LAB — HEMOGLOBIN
Hemoglobin: 8.1 g/dL — ABNORMAL LOW (ref 13.0–17.0)
Hemoglobin: 9.3 g/dL — ABNORMAL LOW (ref 13.0–17.0)

## 2019-01-29 LAB — MAGNESIUM: Magnesium: 1.5 mg/dL — ABNORMAL LOW (ref 1.7–2.4)

## 2019-01-29 LAB — SURGICAL PATHOLOGY

## 2019-01-29 MED ORDER — MAGNESIUM SULFATE 2 GM/50ML IV SOLN
2.0000 g | Freq: Once | INTRAVENOUS | Status: AC
  Administered 2019-01-29: 2 g via INTRAVENOUS
  Filled 2019-01-29: qty 50

## 2019-01-29 MED ORDER — PEG 3350-KCL-NA BICARB-NACL 420 G PO SOLR
4000.0000 mL | Freq: Once | ORAL | Status: AC
  Administered 2019-01-29: 4000 mL via ORAL

## 2019-01-29 MED ORDER — MAGNESIUM CITRATE PO SOLN
1.0000 | Freq: Once | ORAL | Status: AC
  Administered 2019-01-29: 1 via ORAL
  Filled 2019-01-29: qty 296

## 2019-01-29 NOTE — Evaluation (Signed)
Physical Therapy Evaluation Patient Details Name: Trevor Williams MRN: [id number] DOB: 06-14-1960 Today's Date: 01/29/2019   History of Present Illness  Pt is a 58 yo male with medical history significant of alcohol abuse, COPD, tobacco abuse, and lung cancer who presents for multiple falls with generalized weakness.  He was found to be hypotensive, hypokalemic, and hypomagnesemic at admission.  EGD on 01/28/19 was negative for active bleed. Pt to have colonscopy.  Current Hgb is 9.3 and Potassium is 3.9.  Clinical Impression    Pt admitted with above diagnosis. Pt currently with functional limitations due to the deficits listed below (see PT Problem List). Pt will benefit from skilled PT to increase their independence and safety with mobility.  Pt requiring CGA with ambulation and fatigued easily.  Pt d/c plan is complicated by the fact that he is homeless and lives in a tent.    Follow Up Recommendations Other (comment)(Complicated d/c plan due to homeless, but does not need SNF level of care)    Equipment Recommendations  Rolling walker with 5" wheels    Recommendations for Other Services       Precautions / Restrictions Precautions Precautions: Fall      Mobility  Bed Mobility Overal bed mobility: Needs Assistance Bed Mobility: Supine to Sit;Sit to Supine     Supine to sit: Supervision Sit to supine: Supervision      Transfers Overall transfer level: Needs assistance Equipment used: Rolling walker (2 wheeled) Transfers: Sit to/from Stand Sit to Stand: Min guard         General transfer comment: pt with shakiness in standing  Ambulation/Gait Ambulation/Gait assistance: Min guard Gait Distance (Feet): 30 Feet Assistive device: Rolling walker (2 wheeled)       General Gait Details: mild unsteadiness, cued for RW, ambulated 30'x2 with seated rest break  Stairs            Wheelchair Mobility    Modified Rankin (Stroke Patients Only)       Balance  Overall balance assessment: Needs assistance Sitting-balance support: Bilateral upper extremity supported;Feet supported Sitting balance-Leahy Scale: Good     Standing balance support: Bilateral upper extremity supported Standing balance-Leahy Scale: Fair                               Pertinent Vitals/Pain Pain Assessment: No/denies pain    Home Living Family/patient expects to be discharged to:: Shelter/Homeless                 Additional Comments: Pt reports he was living in a tent    Prior Function Level of Independence: Independent         Comments: Reports not able to ambulate far (<50') for the past month due to weakness.     Hand Dominance        Extremity/Trunk Assessment   Upper Extremity Assessment Upper Extremity Assessment: Overall WFL for tasks assessed    Lower Extremity Assessment Lower Extremity Assessment: RLE deficits/detail;LLE deficits/detail RLE Deficits / Details: ROM WFL; MMT 4/5 throughout LLE Deficits / Details: ROM WFL; MMT 4/5 throughout    Cervical / Trunk Assessment Cervical / Trunk Assessment: Normal  Communication   Communication: No difficulties  Cognition Arousal/Alertness: Awake/alert Behavior During Therapy: WFL for tasks assessed/performed Overall Cognitive Status: Within Functional Limits for tasks assessed  General Comments      Exercises     Assessment/Plan    PT Assessment Patient needs continued PT services  PT Problem List Decreased strength;Decreased mobility;Decreased activity tolerance;Decreased balance;Decreased knowledge of use of DME       PT Treatment Interventions DME instruction;Therapeutic exercise;Gait training;Balance training;Functional mobility training;Therapeutic activities;Patient/family education    PT Goals (Current goals can be found in the Care Plan section)  Acute Rehab PT Goals Patient Stated Goal: "not  sure" PT Goal Formulation: With patient Time For Goal Achievement: 02/12/19 Potential to Achieve Goals: Good    Frequency Min 3X/week   Barriers to discharge Inaccessible home environment pt is homeless    Co-evaluation               AM-PAC PT "6 Clicks" Mobility  Outcome Measure Help needed turning from your back to your side while in a flat bed without using bedrails?: None Help needed moving from lying on your back to sitting on the side of a flat bed without using bedrails?: None Help needed moving to and from a bed to a chair (including a wheelchair)?: A Little Help needed standing up from a chair using your arms (e.g., wheelchair or bedside chair)?: A Little Help needed to walk in hospital room?: A Little Help needed climbing 3-5 steps with a railing? : A Little 6 Click Score: 20    End of Session Equipment Utilized During Treatment: Gait belt Activity Tolerance: Patient tolerated treatment well Patient left: in chair;with chair alarm set;with call bell/phone within reach Nurse Communication: Mobility status(white board communication) PT Visit Diagnosis: Unsteadiness on feet (R26.81);Muscle weakness (generalized) (M62.81)    Time: 1010-1033 PT Time Calculation (min) (ACUTE ONLY): 23 min   Charges:   PT Evaluation $PT Eval Low Complexity: 1 Low          Maggie Font, PT Acute Rehab Services Pager [phone] [facility] Rehab [phone] [facility] [phone]   Karlton Lemon 01/29/2019, 11:31 AM

## 2019-01-29 NOTE — Progress Notes (Signed)
PROGRESS NOTE    Trevor Williams  [id number] DOB: 1960-12-09 DOA: 01/26/2019 PCP: Marliss Coots, NP   Brief Narrative:  Patient is a 58 year old male past medical history significant for alcohol abuse, COPD, tobacco abuse and lung cancer.  Patient is a poor historian.  Patient presented with rectal bleed, multiple falls with generalized weakness, lightheadedness and dizziness.  On presentation, patient was found to be mildly hypotensive but responded to IV fluid, severely hypokalemic and hypomagnesemic.  Patient is an alcoholic with abnormal LFTs.  Patient was admitted for further assessment and management.  GI team was consulted, and GI has been directing care.  Patient has undergone EGD that revealed duodenitis and gastritis.  The plan is to proceed with colonoscopy in the morning.  According to the patient, last bleeding was yesterday, 01/28/2019.  Assessment & Plan:   Active Problems:   GI bleed   Alcohol use   Homeless   Hypokalemia   Hypotensive episode   Fall at home, initial encounter  Rectal bleed:  -Patient has undergone EGD. -EGD revealed duodenitis and gastritis.  -Patient reported bright red blood per rectum yesterday.  -For colonoscopy tomorrow.  -H&H is stable for now.  -No bleeding since yesterday.  -GI team input is highly appreciated.   Alcohol Abuse with concerns for Withdrawal: -Continue to monitor patient closely.   -Patient has been on CIWA protocol.    Homeless: -Social work team is Sports administrator.  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  I -RUQ showed "Gallbladder sludge noted. Gallbladder wall thickness of 7.5 mm.Prominent soft tissue edema noted about the gallbladder. Mild pericholecystic fluid collection cannot be excluded. Findings suggest gallbladder sludge with cholecystitis. Increased hepatic echogenicity consistent fatty infiltration or hepatocellular disease. Hepatomegaly cannot be excluded.  Mild ascites cannot be  excluded." -Patient not having any right upper quadrant abdominal pain and likely can be referred to surgery in outpatient setting for further evaluation -Continue to monitor closely -Acute hepatitis panel came back negative.  Nonischemic cardiomyopathy with history of biventricular heart failure of mild CAD History of mitral regurgitation and history of tricuspid regurgitation -Stable.  No symptoms. -Has a history an EF of 15 to 20% -Currently holding his cardiac medications including carvedilol 3.125 mg p.o. twice daily, Entresto 24-26 1 tab p.o. twice daily -Continue rosuvastatin 10 mg p.o. nightly -Continue to monitor for signs and symptoms of volume overload  Hypotension but has a Hx of Hypertension -Blood pressure has improved. -Expect low systolic blood pressure in the face of cardiomyopathy and liver disease.  Hypokalemia -Resolved. -At risk for refeeding syndrome.  Hypophosphatemia -Resolved.   -At risk for refeeding syndrome.    Hypomagnesemia -Magnesium today is 1.5. -At risk for refeeding syndrome.  History of emphysema of the lungs in the setting of tobacco abuse -Stable  Hyponatremia -Likely multifactorial.   -Monitor closely.   DVT prophylaxis: SCDs Code Status: FULL CODE Family Communication:  Disposition Plan: This will depend on hospital course.  Patient is homeless.  Consultants:   Gastroenterology   Procedures:  EGD  Findings:      A non-obstructing Schatzki ring was found in the distal esophagus.      There is no endoscopic evidence of bleeding, ulcerations or varices in       the entire esophagus.      Diffuse moderate inflammation characterized by congestion (edema),       erythema and friability was found in the entire examined stomach.       Biopsies were  taken with a cold forceps for histology.      The cardia and gastric fundus were normal on retroflexion.      Localized mild inflammation characterized by congestion (edema) and        erythema was found in the duodenal bulb.      The first portion of the duodenum and second portion of the duodenum       were normal.      A small hiatal hernia was present. Impression:               - Non-obstructing Schatzki ring.                           - Gastritis. Biopsied.                           - Duodenitis.                           - Normal first portion of the duodenum and second                            portion of the duodenum.                           - Small hiatal hernia.  Antimicrobials:  Anti-infectives (From admission, onward)   None     Subjective: No rectal bleed reported today.  Objective: Vitals:   01/28/19 1240 01/28/19 1257 01/28/19 2059 01/29/19 0457  BP: 98/62 93/79 120/74 122/84  Pulse: 73 78 77 83  Resp: 16 17  18   Temp:  97.8 F (36.6 C) 98.5 F (36.9 C) 98.6 F (37 C)  TempSrc:  Oral Oral   SpO2: 98% 100% 98% 97%  Weight:      Height:        Intake/Output Summary (Last 24 hours) at 01/29/2019 1153 Last data filed at 01/29/2019 0600 Gross per 24 hour  Intake 1617.59 ml  Output 150 ml  Net 1467.59 ml   Filed Weights   01/28/19 0756  Weight: 54 kg   Examination: Physical Exam:  Constitutional: Cachectic. Eyes: PERRLA. Neck: Supple.  No JVD  Respiratory: Decreased air entry globally.   Cardiovascular: S1-S2. Abdomen: Soft, non-tender, non-distended.  Organs are difficult to assess. Musculoskeletal: No leg edema.    Neurologic: Awake and alert.  Patient moves all extremities.  Data Reviewed: I have personally reviewed following labs and imaging studies  CBC: Recent Labs  Lab 01/26/19 1535 01/26/19 2045 01/27/19 0154  01/27/19 0931  01/28/19 0158 01/28/19 0825 01/28/19 1403 01/28/19 2006 01/29/19 0149 01/29/19 0741  WBC 9.9 9.2 8.9  --   --   --  8.5  --   --   --  8.0  --   NEUTROABS 7.3  --   --   --   --   --  5.6  --   --   --  5.2  --   HGB 8.2* 7.6* 7.2*   < >  --    < > 8.7* 8.8* 9.8* 8.4* 8.2*  8.1*  9.3*  HCT 23.8* 21.9* 21.3*  --  21.0*  --  25.3*  --   --   --  24.3*  --   MCV 106.3* 106.8* 108.1*  --   --   --  103.7*  --   --   --  103.8*  --   PLT 87* 76* 71*  --   --   --  61*  --   --   --  64*  --    < > = values in this interval not displayed.   Basic Metabolic Panel: Recent Labs  Lab 01/26/19 1535 01/26/19 2045 01/27/19 0154 01/27/19 0932 01/28/19 0158 01/29/19 0149  NA 139 136 137  --  131* 132*  K 2.3* 2.4* 2.9*  --  3.7 3.9  CL 98 102 102  --  98 98  CO2 22 24 23   --  24 26  GLUCOSE 91 93 86  --  90 94  BUN 7 6 6   --  8 7  CREATININE 0.66 0.60* 0.62  --  0.61 0.65  CALCIUM 8.2* 7.8* 7.8*  --  8.1* 8.0*  MG 1.6* 2.0  --  1.9 1.3* 1.5*  PHOS  --  2.3*  --  2.4* 2.4* 3.1   GFR: Estimated Creatinine Clearance: 77.8 mL/min (by C-G formula based on SCr of 0.65 mg/dL). Liver Function Tests: Recent Labs  Lab 01/26/19 1535 01/26/19 2045 01/27/19 0154 01/28/19 0158 01/29/19 0149  AST 117* 111* 100* 84* 93*  ALT 33 34 32 29 32  ALKPHOS 99 93 90 104 106  BILITOT 1.4* 1.1 1.1 1.5* 1.0  PROT 6.4* 6.0* 5.7* 5.7* 5.6*  ALBUMIN 3.2* 2.9* 2.8* 2.7* 2.6*   Recent Labs  Lab 01/26/19 1535  LIPASE 75*   Recent Labs  Lab 01/26/19 1535  AMMONIA 40*   Coagulation Profile: Recent Labs  Lab 01/26/19 1735 01/27/19 0154  INR 1.1 1.1   Cardiac Enzymes: No results for input(s): CKTOTAL, CKMB, CKMBINDEX, TROPONINI in the last 168 hours. BNP (last 3 results) No results for input(s): PROBNP in the last 8760 hours. HbA1C: No results for input(s): HGBA1C in the last 72 hours. CBG: No results for input(s): GLUCAP in the last 168 hours. Lipid Profile: No results for input(s): CHOL, HDL, LDLCALC, TRIG, CHOLHDL, LDLDIRECT in the last 72 hours. Thyroid Function Tests: No results for input(s): TSH, T4TOTAL, FREET4, T3FREE, THYROIDAB in the last 72 hours. Anemia Panel: Recent Labs    01/27/19 0931  VITAMINB12 698   Sepsis Labs: No results for input(s):  PROCALCITON, LATICACIDVEN in the last 168 hours.  Recent Results (from the past 240 hour(s))  SARS CORONAVIRUS 2 (TAT 6-24 HRS) Nasopharyngeal Nasopharyngeal Swab     Status: None   Collection Time: 01/26/19  5:08 PM   Specimen: Nasopharyngeal Swab  Result Value Ref Range Status   SARS Coronavirus 2 NEGATIVE NEGATIVE Final    Comment: (NOTE) SARS-CoV-2 target nucleic acids are NOT DETECTED. The SARS-CoV-2 RNA is generally detectable in upper and lower respiratory specimens during the acute phase of infection. Negative results do not preclude SARS-CoV-2 infection, do not rule out co-infections with other pathogens, and should not be used as the sole basis for treatment or other patient management decisions. Negative results must be combined with clinical observations, patient history, and epidemiological information. The expected result is Negative. Fact Sheet for Patients: SugarRoll.be Fact Sheet for Healthcare Providers: https://www.woods-mathews.com/ This test is not yet approved or cleared by the [place] FDA and  has been authorized for detection and/or diagnosis of SARS-CoV-2 by FDA under an Emergency Use Authorization (EUA). This EUA will remain  in effect (meaning this test can be used) for the duration of the COVID-19 declaration under Section  56 4(b)(1) of the Act, 21 U.S.C. section 360bbb-3(b)(1), unless the authorization is terminated or revoked sooner. Performed at [facility], [place] [address], [place], [place] [address]     Radiology Studies: No results found. Scheduled Meds: . folic acid  1 mg Oral Daily  . lidocaine   Topical BID  . multivitamin with minerals  1 tablet Oral Daily  . nicotine  21 mg Transdermal Daily  . pantoprazole  40 mg Oral BID  . polyethylene glycol  17 g Oral Daily  . polyethylene glycol-electrolytes  4,000 mL Oral Once  . rosuvastatin  10 mg Oral q1800  . sodium chloride flush  3 mL  Intravenous Q12H  . sodium chloride flush  3 mL Intravenous Q12H  . thiamine  100 mg Oral Daily   Or  . thiamine  100 mg Intravenous Daily   Continuous Infusions: . sodium chloride 250 mL (01/28/19 0929)    LOS: 3 days    Bonnell Public, MD [facility] PAGER is on AMION  If 7PM-7AM, please contact night-coverage [email or web address] Password [facility] 01/29/2019, 11:53 AM

## 2019-01-29 NOTE — Progress Notes (Signed)
[facility] Gastroenterology Progress Note  Trevor Williams 58 y.o. Apr 26, 1960  CC: GI bleed   Subjective: Patient seen and examined at bedside.  He was complaining of rectal bleeding with fresh blood in the stool yesterday but denies any bleeding.  Rectal pain improved with lidocaine topical ointment.  ROS : Afebrile.  Negative for chest pain   Objective: Vital signs in last 24 hours: Vitals:   01/28/19 2059 01/29/19 0457  BP: 120/74 122/84  Pulse: 77 83  Resp:  18  Temp: 98.5 F (36.9 C) 98.6 F (37 C)  SpO2: 98% 97%    Physical Exam:  General:  Alert, thin appearing cooperative, no distress,   Head:  Normocephalic, without obvious abnormality, atraumatic  Eyes:  , EOM's intact,   Lungs:   Clear to auscultation bilaterally, respirations unlabored  Heart:  Regular rate and rhythm, S1, S2 normal  Abdomen:   Soft, non-tender, nondistended, bowel sounds present.  Rash over lower abdominal quadrant noted.  Extremities  Extremities normal, atraumatic, no  edema  Rectal  no external hemorrhoids.  Limited exam because of rectal pain, retained stool in the rectum noted    Lab Results: Recent Labs    01/28/19 0158 01/29/19 0149  NA 131* 132*  K 3.7 3.9  CL 98 98  CO2 24 26  GLUCOSE 90 94  BUN 8 7  CREATININE 0.61 0.65  CALCIUM 8.1* 8.0*  MG 1.3* 1.5*  PHOS 2.4* 3.1   Recent Labs    01/28/19 0158 01/29/19 0149  AST 84* 93*  ALT 29 32  ALKPHOS 104 106  BILITOT 1.5* 1.0  PROT 5.7* 5.6*  ALBUMIN 2.7* 2.6*   Recent Labs    01/28/19 0158  01/29/19 0149 01/29/19 0741  WBC 8.5  --  8.0  --   NEUTROABS 5.6  --  5.2  --   HGB 8.7*   < > 8.2*  8.1* 9.3*  HCT 25.3*  --  24.3*  --   MCV 103.7*  --  103.8*  --   PLT 61*  --  64*  --    < > = values in this interval not displayed.   Recent Labs    01/26/19 1735 01/27/19 0154  LABPROT 14.1 14.5  INR 1.1 1.1      Assessment/Plan: -58 year old patient with past medical history of alcohol abuse presents with GI  bleed/melena.  Patient with low platelet counts.  Normal INR.  EGD January 28, 2019 showed gastritis and duodenitis. -Abnormal LFTs/AST.  Most likely from alcohol use. -Rectal pain/rectal bleeding -hemoglobin stable. -Cardiomyopathy with EF of 15 to 20% -Severe hypokalemia.    Resolved.  -Ultrasound showed some gallbladder sludge.  It also showed gallbladder wall thickening which could be secondary from mild ascites.  Patient is denying any right upper quadrant abdominal pain.  Recommendations ------------------------ -Complaining of intermittent rectal bleeding and rectal pain.  Plan for colonoscopy tomorrow as EGD was negative for active bleeding. -Clear liquid diet today.  N.p.o. past midnight  Risks (bleeding, infection, bowel perforation that could require surgery, sedation-related changes in cardiopulmonary systems), benefits (identification and possible treatment of source of symptoms, exclusion of certain causes of symptoms), and alternatives (watchful waiting, radiographic imaging studies, empiric medical treatment)  were explained to patient in detail and patient wishes to proceed.   Otis Brace MD, [place] 01/29/2019, 8:48 AM  Contact #  [phone]

## 2019-01-29 NOTE — Evaluation (Addendum)
Occupational Therapy Evaluation Patient Details Name: Trevor Williams MRN: [id number] DOB: September 07, 1960 Today's Date: 01/29/2019    History of Present Illness Pt is a 58 yo male with medical history significant of alcohol abuse, COPD, tobacco abuse, and lung cancer who presents for multiple falls with generalized weakness.  He was found to be hypotensive, hypokalemic, and hypomagnesemic at admission.  EGD on 01/28/19 was negative for active bleed. Pt to have colonscopy.  Current Hgb is 9.3 and Potassium is 3.9.    Clinical Impression: Pt admitted with weakness.   Pt currently with functional limitations due to the deficits listed below (see OT Problem List).  Pt will benefit from skilled OT to increase their safety and independence with ADL and functional mobility for ADL to facilitate discharge to venue listed below.   Pt [place] and does not have access to a bathroom or a shower.  Pt states he has no support or family.    Pt overall S and will benefit from OT to reach mod I level    Follow Up Recommendations  No OT follow up    Equipment Recommendations  Other (comment)    Recommendations for Other Services       Precautions / Restrictions Precautions Precautions: Fall      Mobility Bed Mobility Overal bed mobility: Needs Assistance Bed Mobility: Supine to Sit;Sit to Supine     Supine to sit: Supervision Sit to supine: Supervision      Transfers Overall transfer level: Needs assistance   Transfers: Sit to/from Stand;Stand Pivot Transfers Sit to Stand: Supervision Stand pivot transfers: Supervision            Balance Overall balance assessment: Needs assistance Sitting-balance support: Bilateral upper extremity supported;Feet supported Sitting balance-Leahy Scale: Good     Standing balance support: Bilateral upper extremity supported Standing balance-Leahy Scale: Fair                             ADL either performed or  assessed with clinical judgement   ADL Overall ADL's : Needs assistance/impaired                                       General ADL Comments: pt overall S with ADL activity inlcuding going back and forth to bathroom.  Pt participatory but frustrated with having to drink all the bowel prep.     Vision Baseline Vision/History: No visual deficits              Pertinent Vitals/Pain Pain Assessment: No/denies pain     Hand Dominance     Extremity/Trunk Assessment Upper Extremity Assessment Upper Extremity Assessment: Generalized weakness           Communication Communication Communication: No difficulties   Cognition Arousal/Alertness: Awake/alert Behavior During Therapy: WFL for tasks assessed/performed Overall Cognitive Status: Within Functional Limits for tasks assessed                                                Home Living Family/patient expects to be discharged to:: Shelter/Homeless  Additional Comments: Pt reports he was living in a tent      Prior Functioning/Environment Level of Independence: Independent        Comments: Reports not able to ambulate far (<50') for the past month due to weakness.        OT Problem List: Decreased strength;Decreased activity tolerance      OT Treatment/Interventions: Self-care/ADL training;DME and/or AE instruction    OT Goals(Current goals can be found in the care plan section) Acute Rehab OT Goals Patient Stated Goal: back to be able to panhandling OT Goal Formulation: With patient Time For Goal Achievement: 01/29/19 Potential to Achieve Goals: Good  OT Frequency: Min 2X/week    AM-PAC OT "6 Clicks" Daily Activity     Outcome Measure Help from another person eating meals?: None Help from another person taking care of personal grooming?: None Help from another person toileting, which includes using toliet, bedpan, or urinal?: A  Little Help from another person bathing (including washing, rinsing, drying)?: A Little Help from another person to put on and taking off regular upper body clothing?: None Help from another person to put on and taking off regular lower body clothing?: None 6 Click Score: 22   End of Session Nurse Communication: Mobility status  Activity Tolerance: Patient tolerated treatment well Patient left: in bed  OT Visit Diagnosis: Unsteadiness on feet (R26.81);Muscle weakness (generalized) (M62.81)                Time: [id number] OT Time Calculation (min): 12 min Charges:  OT General Charges $OT Visit: 1 Visit OT Evaluation $OT Eval Moderate Complexity: 1 Mod  Kari Baars, [place] [phone] Office- [phone], Edwena Felty D 01/29/2019, 4:11 PM

## 2019-01-30 ENCOUNTER — Encounter (HOSPITAL_COMMUNITY): Payer: Self-pay | Admitting: Gastroenterology

## 2019-01-30 ENCOUNTER — Encounter (HOSPITAL_COMMUNITY)
Admission: EM | Disposition: A | Discharge status: Home / Self Care | Payer: Self-pay | Source: Home / Self Care | Attending: Internal Medicine

## 2019-01-30 ENCOUNTER — Inpatient Hospital Stay (HOSPITAL_COMMUNITY): Payer: Medicaid Other | Admitting: Anesthesiology

## 2019-01-30 DIAGNOSIS — I959 Hypotension, unspecified: Secondary | ICD-10-CM

## 2019-01-30 HISTORY — PX: COLONOSCOPY WITH PROPOFOL: SHX5780

## 2019-01-30 HISTORY — PX: BIOPSY: SHX5522

## 2019-01-30 HISTORY — PX: HEMOSTASIS CLIP PLACEMENT: SHX6857

## 2019-01-30 HISTORY — PX: POLYPECTOMY: SHX5525

## 2019-01-30 LAB — CBC WITH DIFFERENTIAL/PLATELET
Abs Immature Granulocytes: 0.04 10*3/uL (ref 0.00–0.07)
Basophils Absolute: 0.1 10*3/uL (ref 0.0–0.1)
Basophils Relative: 1 %
Eosinophils Absolute: 0.2 10*3/uL (ref 0.0–0.5)
Eosinophils Relative: 2 %
HCT: 25.7 % — ABNORMAL LOW (ref 39.0–52.0)
Hemoglobin: 8.7 g/dL — ABNORMAL LOW (ref 13.0–17.0)
Immature Granulocytes: 1 %
Lymphocytes Relative: 17 %
Lymphs Abs: 1.4 10*3/uL (ref 0.7–4.0)
MCH: 36 pg — ABNORMAL HIGH (ref 26.0–34.0)
MCHC: 33.9 g/dL (ref 30.0–36.0)
MCV: 106.2 fL — ABNORMAL HIGH (ref 80.0–100.0)
Monocytes Absolute: 0.7 10*3/uL (ref 0.1–1.0)
Monocytes Relative: 8 %
Neutro Abs: 5.9 10*3/uL (ref 1.7–7.7)
Neutrophils Relative %: 71 %
Platelets: 68 10*3/uL — ABNORMAL LOW (ref 150–400)
RBC: 2.42 MIL/uL — ABNORMAL LOW (ref 4.22–5.81)
RDW: 14.3 % (ref 11.5–15.5)
WBC: 8.3 10*3/uL (ref 4.0–10.5)
nRBC: 0 % (ref 0.0–0.2)

## 2019-01-30 LAB — COMPREHENSIVE METABOLIC PANEL
ALT: 38 U/L (ref 0–44)
AST: 100 U/L — ABNORMAL HIGH (ref 15–41)
Albumin: 2.5 g/dL — ABNORMAL LOW (ref 3.5–5.0)
Alkaline Phosphatase: 99 U/L (ref 38–126)
Anion gap: 9 (ref 5–15)
BUN: 5 mg/dL — ABNORMAL LOW (ref 6–20)
CO2: 23 mmol/L (ref 22–32)
Calcium: 7.9 mg/dL — ABNORMAL LOW (ref 8.9–10.3)
Chloride: 99 mmol/L (ref 98–111)
Creatinine, Ser: 0.57 mg/dL — ABNORMAL LOW (ref 0.61–1.24)
GFR calc Af Amer: >60 mL/min (ref >60)
GFR calc non Af Amer: >60 mL/min (ref >60)
Glucose, Bld: 85 mg/dL (ref 70–99)
Potassium: 3.9 mmol/L (ref 3.5–5.1)
Sodium: 131 mmol/L — ABNORMAL LOW (ref 135–145)
Total Bilirubin: 1 mg/dL (ref 0.3–1.2)
Total Protein: 5.2 g/dL — ABNORMAL LOW (ref 6.5–8.1)

## 2019-01-30 LAB — MAGNESIUM: Magnesium: 1.7 mg/dL (ref 1.7–2.4)

## 2019-01-30 SURGERY — COLONOSCOPY WITH PROPOFOL
Anesthesia: Monitor Anesthesia Care

## 2019-01-30 MED ORDER — PROPOFOL 500 MG/50ML IV EMUL
INTRAVENOUS | Status: DC | PRN
  Administered 2019-01-30: 100 ug/kg/min via INTRAVENOUS

## 2019-01-30 MED ORDER — SODIUM CHLORIDE 0.9 % IV SOLN: INTRAVENOUS | Status: DC

## 2019-01-30 MED ORDER — PHENYLEPHRINE HCL (PRESSORS) 10 MG/ML IV SOLN
INTRAVENOUS | Status: DC | PRN
  Administered 2019-01-30: 80 ug via INTRAVENOUS

## 2019-01-30 MED ORDER — PROPOFOL 500 MG/50ML IV EMUL
INTRAVENOUS | Status: AC
  Filled 2019-01-30: qty 50

## 2019-01-30 MED ORDER — LACTATED RINGERS IV SOLN
INTRAVENOUS | Status: DC | PRN
  Administered 2019-01-30: 07:00:00 via INTRAVENOUS

## 2019-01-30 MED ORDER — PROPOFOL 500 MG/50ML IV EMUL
INTRAVENOUS | Status: DC | PRN
  Administered 2019-01-30: 20 mg via INTRAVENOUS
  Administered 2019-01-30: 10 mg via INTRAVENOUS
  Administered 2019-01-30: 20 mg via INTRAVENOUS

## 2019-01-30 MED ORDER — MAGNESIUM SULFATE IN D5W 1-5 GM/100ML-% IV SOLN
1.0000 g | Freq: Once | INTRAVENOUS | Status: AC
  Administered 2019-01-30: 1 g via INTRAVENOUS
  Filled 2019-01-30: qty 100

## 2019-01-30 SURGICAL SUPPLY — 21 items

## 2019-01-30 NOTE — Anesthesia Procedure Notes (Signed)
Date/Time: 01/30/2019 7:39 AM Performed by: Glory Buff, CRNA Oxygen Delivery Method: Simple face mask

## 2019-01-30 NOTE — Anesthesia Preprocedure Evaluation (Addendum)
Anesthesia Evaluation  Patient identified by MRN, date of birth, ID band Patient awake    Reviewed: Allergy & Precautions, NPO status , Patient's Chart, lab work & pertinent test results  Airway Mallampati: I  TM Distance: >3 FB Neck ROM: Full    Dental  (+) Edentulous Lower, Edentulous Upper   Pulmonary COPD, Current Smoker and Patient abstained from smoking.,    breath sounds clear to auscultation       Cardiovascular + CAD and +CHF   Rhythm:Regular Rate:Normal  TTE 08/04/2018 1. The right ventricle has severely reduced systolic function. The cavity was moderately enlarged. There is no increase in right ventricular wall thickness.  2. Left atrial size was mildly dilated.  3. Right atrial size was mildly dilated.  4. The mitral valve is degenerative. Mild thickening of the mitral valve leaflet. Mild calcification of the mitral valve leaflet. There is mild to moderate mitral annular calcification present. Mitral valve regurgitation is mild to moderate by color  flow Doppler.  5. The aortic valve is tricuspid. Mild thickening of the aortic valve. No stenosis of the aortic valve.  6. Tricuspid valve regurgitation is moderate.  7. Trivial pericardial effusion is present.  8. When compared to the prior study: No prior echocardiogram available for comparison.  LHC 08/07/18 1. Nonobstructive coronary disease, nonischemic cardiomyopathy.  2. Low filling pressures.  3. Preserved cardiac output.    Neuro/Psych negative neurological ROS  negative psych ROS   GI/Hepatic negative GI ROS, (+)     substance abuse  alcohol use,   Endo/Other  negative endocrine ROS  Renal/GU negative Renal ROS     Musculoskeletal negative musculoskeletal ROS (+)   Abdominal Normal abdominal exam  (+)   Peds  Hematology  (+) Blood dyscrasia (Hgb 8.7), anemia ,   Anesthesia Other Findings   Reproductive/Obstetrics                              Anesthesia Physical  Anesthesia Plan  ASA: IV  Anesthesia Plan: MAC   Post-op Pain Management:    Induction: Intravenous  PONV Risk Score and Plan: 0 and Propofol infusion  Airway Management Planned: Natural Airway and Nasal Cannula  Additional Equipment: None  Intra-op Plan:   Post-operative Plan:   Informed Consent: I have reviewed the patients History and Physical, chart, labs and discussed the procedure including the risks, benefits and alternatives for the proposed anesthesia with the patient or authorized representative who has indicated his/her understanding and acceptance.     Dental advisory given  Plan Discussed with: CRNA  Anesthesia Plan Comments:         Anesthesia Quick Evaluation

## 2019-01-30 NOTE — Op Note (Signed)
[facility] Patient Name: Trevor Williams Procedure Date: 01/30/2019 MRN: [id number] Attending MD: Otis Brace , MD Date of Birth: 19-Aug-1960 CSN: [id number] Age: 58 Admit Type: Inpatient Procedure:                Colonoscopy Indications:              This is the patient's first colonoscopy, Rectal                            bleeding, Rectal pain Providers:                Otis Brace, MD, Elmer Ramp. Tilden Dome, RN, Lina Sar, Technician, Rosario Adie, CRNA Referring MD:              Medicines:                Sedation Administered by an Anesthesia Professional Complications:            No immediate complications. Estimated Blood Loss:     Estimated blood loss was minimal. Procedure:                Pre-Anesthesia Assessment:                           - Prior to the procedure, a History and Physical                            was performed, and patient medications and                            allergies were reviewed. The patient's tolerance of                            previous anesthesia was also reviewed. The risks                            and benefits of the procedure and the sedation                            options and risks were discussed with the patient.                            All questions were answered, and informed consent                            was obtained. Prior Anticoagulants: The patient has                            taken no previous anticoagulant or antiplatelet                            agents. ASA Grade Assessment: IV - A patient with  severe systemic disease that is a constant threat                            to life. After reviewing the risks and benefits,                            the patient was deemed in satisfactory condition to                            undergo the procedure.                           After obtaining informed consent, the colonoscope                    was passed under direct vision. Throughout the                            procedure, the patient's blood pressure, pulse, and                            oxygen saturations were monitored continuously. The                            [id number] Olympus colonoscope was                            introduced through the anus and advanced to the the                            cecum, identified by appendiceal orifice and                            ileocecal valve. The colonoscopy was performed                            without difficulty. The patient tolerated the                            procedure well. The quality of the bowel                            preparation was inadequate. Scope In: 7:47:13 AM Scope Out: 8:23:11 AM Scope Withdrawal Time: 0 hours 30 minutes 2 seconds  Total Procedure Duration: 0 hours 35 minutes 58 seconds  Findings:      Hemorrhoids were found on perianal exam.      A moderate amount of liquid semi-liquid stool was found in the entire       colon, interfering with visualization. Lavage of the area was performed,       resulting in clearance with fair visualization.      A 10 mm polyp was found in the cecum. The polyp was sessile. The polyp       was removed with a hot snare. Resection and retrieval were complete. To       close a defect after polypectomy, one hemostatic  clip was successfully       placed. There was no bleeding at the end of the procedure.      A 12 mm polyp was found in the sigmoid colon at 45 cm proximal to the       anus. The polyp was sessile. The polyp was removed with a piecemeal       technique using a hot snare. Resection and retrieval were complete.      A 4 mm polyp was found in the sigmoid colon. The polyp was flat. The       polyp was removed with a piecemeal technique using a cold biopsy       forceps. Resection and retrieval were complete.      A 7 mm polyp was found in the proximal rectum. The polyp was  sessile.       The polyp was removed with a hot snare. Resection and retrieval were       complete.      A diffuse area of moderately granular and nodular mucosa was found in       the distal rectum. Biopsies were taken with a cold forceps for histology.      Internal hemorrhoids were found during retroflexion. The hemorrhoids       were small. Impression:               - Preparation of the colon was inadequate.                           - Hemorrhoids found on perianal exam.                           - Stool in the entire examined colon.                           - One 10 mm polyp in the cecum, removed with a hot                            snare. Resected and retrieved. Clip was placed.                           - One 12 mm polyp in the sigmoid colon at 45 cm                            proximal to the anus, removed piecemeal using a hot                            snare. Resected and retrieved.                           - One 4 mm polyp in the sigmoid colon, removed                            piecemeal using a cold biopsy forceps. Resected and                            retrieved.                           -  One 7 mm polyp in the proximal rectum, removed                            with a hot snare. Resected and retrieved.                           - Granular and nodular mucosa in the distal rectum.                            Biopsied.                           - Internal hemorrhoids. Moderate Sedation:      Moderate (conscious) sedation was personally administered by an       anesthesia professional. The following parameters were monitored: oxygen       saturation, heart rate, blood pressure, and response to care. Recommendation:           - Return patient to hospital ward for ongoing care.                           - Resume regular diet.                           - Continue present medications.                           - Await pathology results.                           - Repeat  colonoscopy in 1 year because the bowel                            preparation was suboptimal.                           - Return to my office PRN. Procedure Code(s):        --- Professional ---                           [phone], Colonoscopy, flexible; with removal of                            tumor(s), polyp(s), or other lesion(s) by snare                            technique                           [id number], 45, Colonoscopy, flexible; with biopsy,                            single or multiple Diagnosis Code(s):        --- Professional ---                           K64.8, Other hemorrhoids  K63.5, Polyp of colon                           K62.1, Rectal polyp                           K62.89, Other specified diseases of anus and rectum                           K62.5, Hemorrhage of anus and rectum CPT copyright 2019 [employer]. All rights reserved. The codes documented in this report are preliminary and upon coder review may  be revised to meet current compliance requirements. Otis Brace, MD Otis Brace, MD 01/30/2019 8:34:28 AM Number of Addenda: 0

## 2019-01-30 NOTE — Anesthesia Postprocedure Evaluation (Signed)
Anesthesia Post Note  Patient: Trevor Williams  Procedure(s) Performed: COLONOSCOPY WITH PROPOFOL (N/A ) POLYPECTOMY HEMOSTASIS CLIP PLACEMENT BIOPSY     Patient location during evaluation: Endoscopy Anesthesia Type: MAC Level of consciousness: awake and alert Pain management: pain level controlled Vital Signs Assessment: post-procedure vital signs reviewed and stable Respiratory status: spontaneous breathing, nonlabored ventilation, respiratory function stable and patient connected to nasal cannula oxygen Cardiovascular status: stable and blood pressure returned to baseline Postop Assessment: no apparent nausea or vomiting Anesthetic complications: no    Last Vitals:  Vitals:   01/30/19 0831 01/30/19 0910  BP: 101/66 112/71  Pulse: 79 99  Resp: 10 19  Temp: 36.6 C 37 C  SpO2: 100% 100%    Last Pain:  Vitals:   01/30/19 0831  TempSrc: Temporal  PainSc: 10-Worst pain ever                 Ceceilia Cephus L Nanako Stopher

## 2019-01-30 NOTE — Transfer of Care (Signed)
Immediate Anesthesia Transfer of Care Note  Patient: Mainor Hellmann  Procedure(s) Performed: COLONOSCOPY WITH PROPOFOL (N/A ) POLYPECTOMY HEMOSTASIS CLIP PLACEMENT BIOPSY  Patient Location: PACU  Anesthesia Type:MAC  Level of Consciousness: drowsy, patient cooperative and responds to stimulation  Airway & Oxygen Therapy: Patient Spontanous Breathing and Patient connected to face mask oxygen  Post-op Assessment: Report given to RN and Post -op Vital signs reviewed and stable  Post vital signs: Reviewed and stable  Last Vitals:  Vitals Value Taken Time  BP    Temp    Pulse 79 01/30/19 0831  Resp 10 01/30/19 0831  SpO2 100 % 01/30/19 0831  Vitals shown include unvalidated device data.  Last Pain:  Vitals:   01/30/19 0654  TempSrc: Oral  PainSc: 8       Patients Stated Pain Goal: 3 (31/54/00 8676)  Complications: No apparent anesthesia complications

## 2019-01-30 NOTE — Progress Notes (Signed)
PROGRESS NOTE    Trevor Williams  [id number] DOB: 1960/10/03 DOA: 01/26/2019 PCP: Marliss Coots, NP   Brief Narrative:  Patient is a 58 year old male past medical history significant for alcohol abuse, COPD, tobacco abuse and lung cancer.  Patient is a poor historian.  Patient presented with rectal bleed, multiple falls with generalized weakness, lightheadedness and dizziness.  On presentation, patient was found to be mildly hypotensive but responded to IV fluid, severely hypokalemic and hypomagnesemic.  Patient is an alcoholic with abnormal LFTs.  Patient was admitted for further assessment and management.  GI team was consulted, and GI has been directing care.  Patient has undergone EGD that revealed duodenitis and gastritis.  The plan is to proceed with colonoscopy in the morning.  According to the patient, last bleeding was yesterday, 01/28/2019.  01/30/2019: Patient underwent colonoscopy earlier today.  Colonoscopy cecal and sigmoid polyps.  Patient underwent polypectomy.  Hemorrhoids were also noted.  Hemoglobin has remained stable.  Patient should be stable for discharge from the hospital from tomorrow if okay with GI team.  Social work team will assist with disposition.  Patient is homeless.  Assessment & Plan:   Active Problems:   GI bleed   Alcohol use   Homeless   Hypokalemia   Hypotensive episode   Fall at home, initial encounter  Rectal bleed:  -Colonoscopy done today revealed sigmoid and cecal polyps that were removed.  Hemorrhoids were noted as well. -EGD revealed duodenitis and gastritis.  -No further bleeding. -Hemoglobin has been stable.   Alcohol Abuse with concerns for Withdrawal: -Continue to monitor patient closely.   -Patient has been on CIWA protocol.    Homeless: -Social work team is Sports administrator.  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  I -RUQ showed "Gallbladder sludge noted. Gallbladder wall thickness of 7.5 mm.Prominent soft  tissue edema noted about the gallbladder. Mild pericholecystic fluid collection cannot be excluded. Findings suggest gallbladder sludge with cholecystitis. Increased hepatic echogenicity consistent fatty infiltration or hepatocellular disease. Hepatomegaly cannot be excluded.  Mild ascites cannot be excluded." -Patient not having any right upper quadrant abdominal pain and likely can be referred to surgery in outpatient setting for further evaluation -Continue to monitor closely -Acute hepatitis panel came back negative.  Nonischemic cardiomyopathy with history of biventricular heart failure of mild CAD History of mitral regurgitation and history of tricuspid regurgitation -Stable.  No symptoms. -Has a history an EF of 15 to 20% -Currently holding his cardiac medications including carvedilol 3.125 mg p.o. twice daily, Entresto 24-26 1 tab p.o. twice daily -Continue rosuvastatin 10 mg p.o. nightly -Continue to monitor for signs and symptoms of volume overload  Hypotension but has a Hx of Hypertension -Blood pressure has improved. -Expect low systolic blood pressure in the face of cardiomyopathy and liver disease.  Hypokalemia -Resolved. -At risk for refeeding syndrome.  Hypophosphatemia -Resolved.   -At risk for refeeding syndrome.    Hypomagnesemia -Magnesium today is 1.5. -At risk for refeeding syndrome.  History of emphysema of the lungs in the setting of tobacco abuse -Stable  Hyponatremia -Likely multifactorial.   -Monitor closely.   DVT prophylaxis: SCDs Code Status: FULL CODE Family Communication:  Disposition Plan: This will depend on hospital course.  Patient is homeless.  Consultants:   Gastroenterology   Procedures:  EGD  Findings:      A non-obstructing Schatzki ring was found in the distal esophagus.      There is no endoscopic evidence of bleeding, ulcerations or varices  in       the entire esophagus.      Diffuse moderate inflammation characterized by  congestion (edema),       erythema and friability was found in the entire examined stomach.       Biopsies were taken with a cold forceps for histology.      The cardia and gastric fundus were normal on retroflexion.      Localized mild inflammation characterized by congestion (edema) and       erythema was found in the duodenal bulb.      The first portion of the duodenum and second portion of the duodenum       were normal.      A small hiatal hernia was present. Impression:               - Non-obstructing Schatzki ring.                           - Gastritis. Biopsied.                           - Duodenitis.                           - Normal first portion of the duodenum and second                            portion of the duodenum.                           - Small hiatal hernia.  Antimicrobials:  Anti-infectives (From admission, onward)   None     Subjective: No rectal bleed reported today.  Objective: Vitals:   01/30/19 0349 01/30/19 0654 01/30/19 0831 01/30/19 0910  BP: 109/69 122/81 101/66 112/71  Pulse: 84 80 79 99  Resp: 16 15 10 19   Temp: 99.8 F (37.7 C) 98.4 F (36.9 C) 97.8 F (36.6 C) 98.6 F (37 C)  TempSrc: Oral Oral Temporal   SpO2: 95% 94% 100% 100%  Weight:      Height:        Intake/Output Summary (Last 24 hours) at 01/30/2019 1309 Last data filed at 01/30/2019 0832 Gross per 24 hour  Intake 579.17 ml  Output -  Net 579.17 ml   Filed Weights   01/28/19 0756  Weight: 54 kg   Examination: Physical Exam:  Constitutional: Cachectic. Eyes: PERRLA. Neck: Supple.  No JVD  Respiratory: Decreased air entry globally.   Cardiovascular: S1-S2. Abdomen: Soft, non-tender, non-distended.  Organs are difficult to assess. Musculoskeletal: No leg edema.    Neurologic: Awake and alert.  Patient moves all extremities.  Data Reviewed: I have personally reviewed following labs and imaging studies  CBC: Recent Labs  Lab 01/26/19 1535 01/26/19 2045  01/27/19 0154  01/27/19 0931  01/28/19 0158  01/28/19 1403 01/28/19 2006 01/29/19 0149 01/29/19 0741 01/30/19 0303  WBC 9.9 9.2 8.9  --   --   --  8.5  --   --   --  8.0  --  8.3  NEUTROABS 7.3  --   --   --   --   --  5.6  --   --   --  5.2  --  5.9  HGB 8.2* 7.6* 7.2*   < >  --    < >  8.7*   < > 9.8* 8.4* 8.2*  8.1* 9.3* 8.7*  HCT 23.8* 21.9* 21.3*  --  21.0*  --  25.3*  --   --   --  24.3*  --  25.7*  MCV 106.3* 106.8* 108.1*  --   --   --  103.7*  --   --   --  103.8*  --  106.2*  PLT 87* 76* 71*  --   --   --  61*  --   --   --  64*  --  68*   < > = values in this interval not displayed.   Basic Metabolic Panel: Recent Labs  Lab 01/26/19 2045 01/27/19 0154 01/27/19 0932 01/28/19 0158 01/29/19 0149 01/30/19 0303  NA 136 137  --  131* 132* 131*  K 2.4* 2.9*  --  3.7 3.9 3.9  CL 102 102  --  98 98 99  CO2 24 23  --  24 26 23   GLUCOSE 93 86  --  90 94 85  BUN 6 6  --  8 7 5*  CREATININE 0.60* 0.62  --  0.61 0.65 0.57*  CALCIUM 7.8* 7.8*  --  8.1* 8.0* 7.9*  MG 2.0  --  1.9 1.3* 1.5* 1.7  PHOS 2.3*  --  2.4* 2.4* 3.1  --    GFR: Estimated Creatinine Clearance: 77.8 mL/min (A) (by C-G formula based on SCr of 0.57 mg/dL (L)). Liver Function Tests: Recent Labs  Lab 01/26/19 2045 01/27/19 0154 01/28/19 0158 01/29/19 0149 01/30/19 0303  AST 111* 100* 84* 93* 100*  ALT 34 32 29 32 38  ALKPHOS 93 90 104 106 99  BILITOT 1.1 1.1 1.5* 1.0 1.0  PROT 6.0* 5.7* 5.7* 5.6* 5.2*  ALBUMIN 2.9* 2.8* 2.7* 2.6* 2.5*   Recent Labs  Lab 01/26/19 1535  LIPASE 75*   Recent Labs  Lab 01/26/19 1535  AMMONIA 40*   Coagulation Profile: Recent Labs  Lab 01/26/19 1735 01/27/19 0154  INR 1.1 1.1   Cardiac Enzymes: No results for input(s): CKTOTAL, CKMB, CKMBINDEX, TROPONINI in the last 168 hours. BNP (last 3 results) No results for input(s): PROBNP in the last 8760 hours. HbA1C: No results for input(s): HGBA1C in the last 72 hours. CBG: No results for input(s): GLUCAP  in the last 168 hours. Lipid Profile: No results for input(s): CHOL, HDL, LDLCALC, TRIG, CHOLHDL, LDLDIRECT in the last 72 hours. Thyroid Function Tests: No results for input(s): TSH, T4TOTAL, FREET4, T3FREE, THYROIDAB in the last 72 hours. Anemia Panel: No results for input(s): VITAMINB12, FOLATE, FERRITIN, TIBC, IRON, RETICCTPCT in the last 72 hours. Sepsis Labs: No results for input(s): PROCALCITON, LATICACIDVEN in the last 168 hours.  Recent Results (from the past 240 hour(s))  SARS CORONAVIRUS 2 (TAT 6-24 HRS) Nasopharyngeal Nasopharyngeal Swab     Status: None   Collection Time: 01/26/19  5:08 PM   Specimen: Nasopharyngeal Swab  Result Value Ref Range Status   SARS Coronavirus 2 NEGATIVE NEGATIVE Final    Comment: (NOTE) SARS-CoV-2 target nucleic acids are NOT DETECTED. The SARS-CoV-2 RNA is generally detectable in upper and lower respiratory specimens during the acute phase of infection. Negative results do not preclude SARS-CoV-2 infection, do not rule out co-infections with other pathogens, and should not be used as the sole basis for treatment or other patient management decisions. Negative results must be combined with clinical observations, patient history, and epidemiological information. The expected result is Negative. Fact Sheet for Patients: SugarRoll.be  Fact Sheet for Healthcare Providers: https://www.woods-mathews.com/ This test is not yet approved or cleared by the [place] FDA and  has been authorized for detection and/or diagnosis of SARS-CoV-2 by FDA under an Emergency Use Authorization (EUA). This EUA will remain  in effect (meaning this test can be used) for the duration of the COVID-19 declaration under Section 56 4(b)(1) of the Act, 21 U.S.C. section 360bbb-3(b)(1), unless the authorization is terminated or revoked sooner. Performed at [facility], [place] [address], [place], [place] [address]      Radiology Studies: No results found. Scheduled Meds: . folic acid  1 mg Oral Daily  . lidocaine   Topical BID  . multivitamin with minerals  1 tablet Oral Daily  . nicotine  21 mg Transdermal Daily  . pantoprazole  40 mg Oral BID  . polyethylene glycol  17 g Oral Daily  . rosuvastatin  10 mg Oral q1800  . sodium chloride flush  3 mL Intravenous Q12H  . sodium chloride flush  3 mL Intravenous Q12H  . thiamine  100 mg Oral Daily   Or  . thiamine  100 mg Intravenous Daily   Continuous Infusions: . sodium chloride 250 mL (01/28/19 0929)    LOS: 4 days    Bonnell Public, MD [facility] PAGER is on AMION  If 7PM-7AM, please contact night-coverage [email or web address] Password [email or web address] 01/30/2019, 1:09 PM

## 2019-01-30 NOTE — Progress Notes (Signed)
[facility]ology Progress Note  Trevor Williams 58 y.o. [birth year]  CC: GI bleed   Subjective: No acute issues overnight.  Denies further rectal bleeding.  Rectal pain improving  ROS : Afebrile.  Negative for chest pain   Objective: Vital signs in last 24 hours: Vitals:   01/30/19 0349 01/30/19 0654  BP: 109/69 122/81  Pulse: 84 80  Resp: 16 15  Temp: 99.8 F (37.7 C) 98.4 F (36.9 C)  SpO2: 95% 94%    Physical Exam:  General:  Alert, thin appearing cooperative, no distress,   Head:  Normocephalic, without obvious abnormality, atraumatic  Eyes:  , EOM's intact,   Lungs:   Clear to auscultation bilaterally, respirations unlabored  Heart:  Regular rate and rhythm, S1, S2 normal  Abdomen:   Soft, non-tender, nondistended, bowel sounds present.  Rash over lower abdominal quadrant noted.           Lab Results: Recent Labs    01/28/19 0158 01/29/19 0149 01/30/19 0303  NA 131* 132* 131*  K 3.7 3.9 3.9  CL 98 98 99  CO2 24 26 23   GLUCOSE 90 94 85  BUN 8 7 5*  CREATININE 0.61 0.65 0.57*  CALCIUM 8.1* 8.0* 7.9*  MG 1.3* 1.5* 1.7  PHOS 2.4* 3.1  --    Recent Labs    01/29/19 0149 01/30/19 0303  AST 93* 100*  ALT 32 38  ALKPHOS 106 99  BILITOT 1.0 1.0  PROT 5.6* 5.2*  ALBUMIN 2.6* 2.5*   Recent Labs    01/29/19 0149 01/29/19 0741 01/30/19 0303  WBC 8.0  --  8.3  NEUTROABS 5.2  --  5.9  HGB 8.2*  8.1* 9.3* 8.7*  HCT 24.3*  --  25.7*  MCV 103.8*  --  106.2*  PLT 64*  --  68*   No results for input(s): LABPROT, INR in the last 72 hours.    Assessment/Plan: -58 year old patient with past medical history of alcohol abuse presents with GI bleed/melena.  Patient with low platelet counts.  Normal INR.  EGD January 28, 2019 showed gastritis and duodenitis. -Abnormal LFTs/AST.  Most likely from alcohol use. -Rectal pain/rectal bleeding -hemoglobin stable. -Cardiomyopathy with EF of 15 to 20% -Severe hypokalemia.    Resolved.  -Ultrasound showed  some gallbladder sludge.  It also showed gallbladder wall thickening which could be secondary from mild ascites.  Patient is denying any right upper quadrant abdominal pain.  Recommendations ------------------------ -Proceed with colonoscopy today  Risks (bleeding, infection, bowel perforation that could require surgery, sedation-related changes in cardiopulmonary systems), benefits (identification and possible treatment of source of symptoms, exclusion of certain causes of symptoms), and alternatives (watchful waiting, radiographic imaging studies, empiric medical treatment)  were explained to patient in detail and patient wishes to proceed.   Trevor Brace MD, [place] 01/30/2019, 7:35 AM  Contact #  [phone]

## 2019-01-31 ENCOUNTER — Inpatient Hospital Stay (HOSPITAL_COMMUNITY): Payer: Medicaid Other

## 2019-01-31 ENCOUNTER — Encounter (HOSPITAL_COMMUNITY): Payer: Self-pay | Admitting: Gastroenterology

## 2019-01-31 DIAGNOSIS — D649 Anemia, unspecified: Secondary | ICD-10-CM

## 2019-01-31 DIAGNOSIS — F1010 Alcohol abuse, uncomplicated: Secondary | ICD-10-CM

## 2019-01-31 DIAGNOSIS — R945 Abnormal results of liver function studies: Secondary | ICD-10-CM

## 2019-01-31 LAB — CBC WITH DIFFERENTIAL/PLATELET
Abs Immature Granulocytes: 0.04 10*3/uL (ref 0.00–0.07)
Basophils Absolute: 0.1 10*3/uL (ref 0.0–0.1)
Basophils Relative: 1 %
Eosinophils Absolute: 0.2 10*3/uL (ref 0.0–0.5)
Eosinophils Relative: 2 %
HCT: 26.2 % — ABNORMAL LOW (ref 39.0–52.0)
Hemoglobin: 8.7 g/dL — ABNORMAL LOW (ref 13.0–17.0)
Immature Granulocytes: 0 %
Lymphocytes Relative: 17 %
Lymphs Abs: 1.7 10*3/uL (ref 0.7–4.0)
MCH: 35.4 pg — ABNORMAL HIGH (ref 26.0–34.0)
MCHC: 33.2 g/dL (ref 30.0–36.0)
MCV: 106.5 fL — ABNORMAL HIGH (ref 80.0–100.0)
Monocytes Absolute: 0.8 10*3/uL (ref 0.1–1.0)
Monocytes Relative: 8 %
Neutro Abs: 7.3 10*3/uL (ref 1.7–7.7)
Neutrophils Relative %: 72 %
Platelets: 88 10*3/uL — ABNORMAL LOW (ref 150–400)
RBC: 2.46 MIL/uL — ABNORMAL LOW (ref 4.22–5.81)
RDW: 14.2 % (ref 11.5–15.5)
WBC: 10.1 10*3/uL (ref 4.0–10.5)
nRBC: 0 % (ref 0.0–0.2)

## 2019-01-31 LAB — RENAL FUNCTION PANEL
Albumin: 2.8 g/dL — ABNORMAL LOW (ref 3.5–5.0)
Anion gap: 6 (ref 5–15)
BUN: 6 mg/dL (ref 6–20)
CO2: 25 mmol/L (ref 22–32)
Calcium: 8.3 mg/dL — ABNORMAL LOW (ref 8.9–10.3)
Chloride: 99 mmol/L (ref 98–111)
Creatinine, Ser: 0.65 mg/dL (ref 0.61–1.24)
GFR calc Af Amer: >60 mL/min (ref >60)
GFR calc non Af Amer: >60 mL/min (ref >60)
Glucose, Bld: 97 mg/dL (ref 70–99)
Phosphorus: 3.4 mg/dL (ref 2.5–4.6)
Potassium: 4.3 mmol/L (ref 3.5–5.1)
Sodium: 130 mmol/L — ABNORMAL LOW (ref 135–145)

## 2019-01-31 LAB — SURGICAL PATHOLOGY

## 2019-01-31 LAB — MAGNESIUM: Magnesium: 1.6 mg/dL — ABNORMAL LOW (ref 1.7–2.4)

## 2019-01-31 MED ORDER — TECHNETIUM TC 99M MEBROFENIN IV KIT
5.1000 | PACK | Freq: Once | INTRAVENOUS | Status: AC | PRN
  Administered 2019-01-31: 5.1 via INTRAVENOUS

## 2019-01-31 MED ORDER — MAGNESIUM SULFATE 2 GM/50ML IV SOLN
2.0000 g | Freq: Once | INTRAVENOUS | Status: AC
  Administered 2019-01-31: 2 g via INTRAVENOUS
  Filled 2019-01-31: qty 50

## 2019-01-31 NOTE — Progress Notes (Signed)
PROGRESS NOTE    Trevor Williams  [id number] DOB: 07-Apr-1960 DOA: 01/26/2019 PCP: Marliss Coots, NP   Brief Narrative:  Patient is a 58 year old male past medical history significant for alcohol abuse, COPD, tobacco abuse and lung cancer.  Patient is a poor historian.  Patient presented with rectal bleed, multiple falls with generalized weakness, lightheadedness and dizziness.  On presentation, patient was found to be mildly hypotensive but responded to IV fluid, severely hypokalemic and hypomagnesemic.  Patient is an alcoholic with abnormal LFTs.  Patient was admitted for further assessment and management.  GI team was consulted, and GI has been directing care.  Patient has undergone EGD that revealed duodenitis and gastritis.  The plan is to proceed with colonoscopy in the morning.  According to the patient, last bleeding was yesterday, 01/28/2019.  01/30/2019: Patient underwent colonoscopy earlier today.  Colonoscopy cecal and sigmoid polyps.  Patient underwent polypectomy.  Hemorrhoids were also noted.  Hemoglobin has remained stable.  Patient should be stable for discharge from the hospital from tomorrow if okay with GI team.  Social work team will assist with disposition.  Patient is homeless.  Subjective: No rectal bleed reported today.  Patient reports abdominal pain  Assessment & Plan:   Active Problems:   GI bleed   Alcohol use   Homeless   Hypokalemia   Hypotensive episode   Fall at home, initial encounter  Rectal bleed:  -Colonoscopy revealed sigmoid and cecal polyps that were removed.  Hemorrhoids were noted as well.  No active source of GI bleed -EGD revealed duodenitis and gastritis.  -No further bleeding. -Hemoglobin has been stable.   Alcohol Abuse with concerns for Withdrawal: -Continue to monitor patient closely.   -Patient has been on CIWA protocol.    Homeless: -Social work team is Sports administrator.  Abnormal LFT:  -Most likely in the setting of alcohol  abuse. -Patient with complains of right upper quadrant pain, HIDA scan pending to rule out cholecystitis. -Acute hepatitis panel came back negative.  Nonischemic cardiomyopathy with history of biventricular heart failure of mild CAD History of mitral regurgitation and history of tricuspid regurgitation -Stable.  No symptoms. -Has a history an EF of 15 to 20% -Currently holding his cardiac medications including carvedilol 3.125 mg p.o. twice daily, Entresto 24-26 1 tab p.o. twice daily -Continue rosuvastatin 10 mg p.o. nightly -Continue to monitor for signs and symptoms of volume overload  Hypotension but has a Hx of Hypertension -Blood pressure has improved. -Expect low systolic blood pressure in the face of cardiomyopathy and liver disease.  Hypokalemia -Resolved. -At risk for refeeding syndrome.  Hypophosphatemia -Resolved.   -At risk for refeeding syndrome.    Hypomagnesemia -1.6 today, repleted, recheck in a.m.  History of emphysema of the lungs in the setting of tobacco abuse -Stable  Hyponatremia -Likely multifactorial.   -Monitor closely.   Thrombocytopenia -Secondary to liver disease from alcohol  DVT prophylaxis: SCDs Code Status: FULL CODE Family Communication: D/W patient Disposition Plan: This will depend on hospital course.  Patient is homeless.  Consultants:   Gastroenterology   Procedures:  EGD  Findings:      A non-obstructing Schatzki ring was found in the distal esophagus.      There is no endoscopic evidence of bleeding, ulcerations or varices in       the entire esophagus.      Diffuse moderate inflammation characterized by congestion (edema),       erythema and friability was found in the entire examined stomach.  Biopsies were taken with a cold forceps for histology.      The cardia and gastric fundus were normal on retroflexion.      Localized mild inflammation characterized by congestion (edema) and       erythema was found in the  duodenal bulb.      The first portion of the duodenum and second portion of the duodenum       were normal.      A small hiatal hernia was present. Impression:               - Non-obstructing Schatzki ring.                           - Gastritis. Biopsied.                           - Duodenitis.                           - Normal first portion of the duodenum and second                            portion of the duodenum.                           - Small hiatal hernia.  Antimicrobials:  Anti-infectives (From admission, onward)   None       Objective: Vitals:   01/30/19 0910 01/30/19 1350 01/30/19 2045 01/31/19 0537  BP: 112/71 112/78 113/69 129/78  Pulse: 99 94 85 87  Resp: 19 17 16 14   Temp: 98.6 F (37 C) 99.8 F (37.7 C) 99.5 F (37.5 C) 99.1 F (37.3 C)  TempSrc:  Oral Oral Oral  SpO2: 100% 99% 96% 97%  Weight:      Height:        Intake/Output Summary (Last 24 hours) at 01/31/2019 1350 Last data filed at 01/31/2019 0900 Gross per 24 hour  Intake 940 ml  Output 450 ml  Net 490 ml   Filed Weights   01/28/19 0756  Weight: 54 kg   Examination: Physical Exam:  Awake Alert, Oriented X 3, No new F.N deficits, Normal affect, cachectic Symmetrical Chest wall movement, Good air movement bilaterally, CTAB RRR,No Gallops,Rubs or new Murmurs, No Parasternal Heave +ve B.Sounds, Abd Soft, minimal tenderness to palpation today, No rebound - guarding or rigidity. No Cyanosis, Clubbing or edema, No new Rash or bruise     Data Reviewed: I have personally reviewed following labs and imaging studies  CBC: Recent Labs  Lab 01/26/19 1535  01/27/19 0154  01/27/19 0931  01/28/19 0158  01/28/19 2006 01/29/19 0149 01/29/19 0741 01/30/19 0303 01/31/19 0446  WBC 9.9   < > 8.9  --   --   --  8.5  --   --  8.0  --  8.3 10.1  NEUTROABS 7.3  --   --   --   --   --  5.6  --   --  5.2  --  5.9 7.3  HGB 8.2*   < > 7.2*   < >  --    < > 8.7*   < > 8.4* 8.2*  8.1* 9.3* 8.7*  8.7*  HCT 23.8*   < > 21.3*  --  21.0*  --  25.3*  --   --  24.3*  --  25.7* 26.2*  MCV 106.3*   < > 108.1*  --   --   --  103.7*  --   --  103.8*  --  106.2* 106.5*  PLT 87*   < > 71*  --   --   --  61*  --   --  64*  --  68* 88*   < > = values in this interval not displayed.   Basic Metabolic Panel: Recent Labs  Lab 01/26/19 2045 01/27/19 0154 01/27/19 0932 01/28/19 0158 01/29/19 0149 01/30/19 0303 01/31/19 0446  NA 136 137  --  131* 132* 131* 130*  K 2.4* 2.9*  --  3.7 3.9 3.9 4.3  CL 102 102  --  98 98 99 99  CO2 24 23  --  24 26 23 25   GLUCOSE 93 86  --  90 94 85 97  BUN 6 6  --  8 7 5* 6  CREATININE 0.60* 0.62  --  0.61 0.65 0.57* 0.65  CALCIUM 7.8* 7.8*  --  8.1* 8.0* 7.9* 8.3*  MG 2.0  --  1.9 1.3* 1.5* 1.7 1.6*  PHOS 2.3*  --  2.4* 2.4* 3.1  --  3.4   GFR: Estimated Creatinine Clearance: 77.8 mL/min (by C-G formula based on SCr of 0.65 mg/dL). Liver Function Tests: Recent Labs  Lab 01/26/19 2045 01/27/19 0154 01/28/19 0158 01/29/19 0149 01/30/19 0303 01/31/19 0446  AST 111* 100* 84* 93* 100*  --   ALT 34 32 29 32 38  --   ALKPHOS 93 90 104 106 99  --   BILITOT 1.1 1.1 1.5* 1.0 1.0  --   PROT 6.0* 5.7* 5.7* 5.6* 5.2*  --   ALBUMIN 2.9* 2.8* 2.7* 2.6* 2.5* 2.8*   Recent Labs  Lab 01/26/19 1535  LIPASE 75*   Recent Labs  Lab 01/26/19 1535  AMMONIA 40*   Coagulation Profile: Recent Labs  Lab 01/26/19 1735 01/27/19 0154  INR 1.1 1.1   Cardiac Enzymes: No results for input(s): CKTOTAL, CKMB, CKMBINDEX, TROPONINI in the last 168 hours. BNP (last 3 results) No results for input(s): PROBNP in the last 8760 hours. HbA1C: No results for input(s): HGBA1C in the last 72 hours. CBG: No results for input(s): GLUCAP in the last 168 hours. Lipid Profile: No results for input(s): CHOL, HDL, LDLCALC, TRIG, CHOLHDL, LDLDIRECT in the last 72 hours. Thyroid Function Tests: No results for input(s): TSH, T4TOTAL, FREET4, T3FREE, THYROIDAB in the last 72  hours. Anemia Panel: No results for input(s): VITAMINB12, FOLATE, FERRITIN, TIBC, IRON, RETICCTPCT in the last 72 hours. Sepsis Labs: No results for input(s): PROCALCITON, LATICACIDVEN in the last 168 hours.  Recent Results (from the past 240 hour(s))  SARS CORONAVIRUS 2 (TAT 6-24 HRS) Nasopharyngeal Nasopharyngeal Swab     Status: None   Collection Time: 01/26/19  5:08 PM   Specimen: Nasopharyngeal Swab  Result Value Ref Range Status   SARS Coronavirus 2 NEGATIVE NEGATIVE Final    Comment: (NOTE) SARS-CoV-2 target nucleic acids are NOT DETECTED. The SARS-CoV-2 RNA is generally detectable in upper and lower respiratory specimens during the acute phase of infection. Negative results do not preclude SARS-CoV-2 infection, do not rule out co-infections with other pathogens, and should not be used as the sole basis for treatment or other patient management decisions. Negative results must be combined with clinical observations, patient history, and epidemiological information. The expected result is Negative. Fact  Sheet for Patients: SugarRoll.be Fact Sheet for Healthcare Providers: https://www.woods-mathews.com/ This test is not yet approved or cleared by the [place] FDA and  has been authorized for detection and/or diagnosis of SARS-CoV-2 by FDA under an Emergency Use Authorization (EUA). This EUA will remain  in effect (meaning this test can be used) for the duration of the COVID-19 declaration under Section 56 4(b)(1) of the Act, 21 U.S.C. section 360bbb-3(b)(1), unless the authorization is terminated or revoked sooner. Performed at [facility], [place] [address] [place], [place] [address]     Radiology Studies: No results found. Scheduled Meds: . folic acid  1 mg Oral Daily  . lidocaine   Topical BID  . multivitamin with minerals  1 tablet Oral Daily  . nicotine  21 mg Transdermal Daily  . pantoprazole  40 mg Oral BID  .  polyethylene glycol  17 g Oral Daily  . rosuvastatin  10 mg Oral q1800  . sodium chloride flush  3 mL Intravenous Q12H  . sodium chloride flush  3 mL Intravenous Q12H  . thiamine  100 mg Oral Daily   Or  . thiamine  100 mg Intravenous Daily   Continuous Infusions: . sodium chloride 250 mL (01/28/19 0929)    LOS: 5 days    Phillips Climes, MD [facility] PAGER is on AMION  If 7PM-7AM, please contact night-coverage [email or web address] Password [email or web address] 01/31/2019, 1:50 PM

## 2019-01-31 NOTE — TOC Progression Note (Signed)
Transition of Care [facility]) - Progression Note    Patient Details  Name: Trevor Williams MRN: [id number] Date of Birth: 16-Dec-1960  [facility] of [facility] CM/SW Contact  Purcell Mouton, RN Phone Number: 01/31/2019, 2:11 PM  Clinical Narrative:    Spoke with pt concerning discharge, homeless shelters and list was given to pt. [place], get his medications and mail at [facility]). Pt is aware and was encouraged to go contact [place] for shelter. Pt will need to call or go to area shelters and was encourage to do so while here in the hospital. List was given to him.         Expected Discharge Plan and Services                                                 Social Determinants of Health (SDOH) Interventions    Readmission Risk Interventions No flowsheet data found.

## 2019-01-31 NOTE — Progress Notes (Signed)
Physical Therapy Treatment Patient Details Name: Trevor Williams MRN: [id number] DOB: October 02, 1960 Today's Date: 01/31/2019    History of Present Illness Pt is a 58 yo male with medical history significant of alcohol abuse, COPD, tobacco abuse, and lung cancer who presents for multiple falls with generalized weakness.  He was found to be hypotensive, hypokalemic, and hypomagnesemic at admission.  EGD on 01/28/19 was negative for active bleed. Colonscopy on 11/12 was negative for acute bleed.  Pt to have HIDA scan today.    PT Comments    Pt able to increase gait distance to 180' today with min guard assist and RW. He was able to ambulate a short distance without RW but with mild unsteadiness.  Further treatment limited due to pt fatigue and reports weak from NPO status.  Will f/u as able.     Follow Up Recommendations  Other (comment) Complicated d/c plan as pt is homeless; does not need SNF level of care     Equipment Recommendations  Rolling walker with 5" wheels    Recommendations for Other Services       Precautions / Restrictions Precautions Precautions: Fall    Mobility  Bed Mobility Overal bed mobility: Needs Assistance Bed Mobility: Supine to Sit;Sit to Supine     Supine to sit: Supervision Sit to supine: Supervision      Transfers Overall transfer level: Needs assistance Equipment used: Rolling walker (2 wheeled) Transfers: Sit to/from Stand Sit to Stand: Supervision            Ambulation/Gait Ambulation/Gait assistance: Min guard Gait Distance (Feet): 180 Feet Assistive device: Rolling walker (2 wheeled);None       General Gait Details: Ambulated 180' with RW and 30' without RW with rest break in between.  Cued for RW placement.  Without RW pt with shorter steps and mild unsteadiness but no overt LOB.   Stairs             Wheelchair Mobility    Modified Rankin (Stroke Patients Only)       Balance Overall balance assessment: Needs  assistance Sitting-balance support: Bilateral upper extremity supported;Feet supported Sitting balance-Leahy Scale: Good     Standing balance support: Bilateral upper extremity supported Standing balance-Leahy Scale: Fair                              Cognition Arousal/Alertness: Awake/alert Behavior During Therapy: WFL for tasks assessed/performed Overall Cognitive Status: Within Functional Limits for tasks assessed                                 General Comments: Pt with c/o weakness but no pain      Exercises      General Comments General comments (skin integrity, edema, etc.): Pt c/o weakness and fatigue, not eating in a few days and is now NPO      Pertinent Vitals/Pain Pain Assessment: No/denies pain    Home Living                      Prior Function            PT Goals (current goals can now be found in the care plan section) Progress towards PT goals: Progressing toward goals    Frequency    Min 3X/week      PT Plan Current plan remains appropriate  Co-evaluation              AM-PAC PT "6 Clicks" Mobility   Outcome Measure  Help needed turning from your back to your side while in a flat bed without using bedrails?: None Help needed moving from lying on your back to sitting on the side of a flat bed without using bedrails?: None Help needed moving to and from a bed to a chair (including a wheelchair)?: None Help needed standing up from a chair using your arms (e.g., wheelchair or bedside chair)?: None Help needed to walk in hospital room?: None Help needed climbing 3-5 steps with a railing? : A Little 6 Click Score: 23    End of Session Equipment Utilized During Treatment: Gait belt Activity Tolerance: Patient limited by fatigue Patient left: in bed;with bed alarm set;with call bell/phone within reach Nurse Communication: Mobility status PT Visit Diagnosis: Unsteadiness on feet (R26.81);Muscle weakness  (generalized) (M62.81)     Time: [id number] PT Time Calculation (min) (ACUTE ONLY): 16 min  Charges:  $Gait Training: 8-22 mins                     Maggie Font, PT Acute Rehab Services Pager [phone] [facility] [place] 01/31/2019, 1:02 PM

## 2019-01-31 NOTE — Progress Notes (Addendum)
[facility] Progress Note  Trevor Williams 58 y.o. November 08, 1960  CC: GI bleed   Subjective: No acute issues.  Had a bowel movement today denies any blood in the stool.  Tolerating diet.  Complaining of mild right-sided discomfort  ROS : Afebrile.  Negative for chest pain   Objective: Vital signs in last 24 hours: Vitals:   01/30/19 2045 01/31/19 0537  BP: 113/69 129/78  Pulse: 85 87  Resp: 16 14  Temp: 99.5 F (37.5 C) 99.1 F (37.3 C)  SpO2: 96% 97%    Physical Exam:  General:  Alert, thin appearing cooperative, no distress,   Head:  Normocephalic, without obvious abnormality, atraumatic  Eyes:  , EOM's intact,   Lungs:   Clear to auscultation bilaterally, respirations unlabored  Heart:  Regular rate and rhythm, S1, S2 normal  Abdomen:   Soft, non-tender, mild right upper quadrant discomfort, nondistended, bowel sounds present.  Rash over lower abdominal quadrant noted, appears improved compared to previous exam           Lab Results: Recent Labs    01/29/19 0149 01/30/19 0303 01/31/19 0446  NA 132* 131* 130*  K 3.9 3.9 4.3  CL 98 99 99  CO2 26 23 25   GLUCOSE 94 85 97  BUN 7 5* 6  CREATININE 0.65 0.57* 0.65  CALCIUM 8.0* 7.9* 8.3*  MG 1.5* 1.7 1.6*  PHOS 3.1  --  3.4   Recent Labs    01/29/19 0149 01/30/19 0303 01/31/19 0446  AST 93* 100*  --   ALT 32 38  --   ALKPHOS 106 99  --   BILITOT 1.0 1.0  --   PROT 5.6* 5.2*  --   ALBUMIN 2.6* 2.5* 2.8*   Recent Labs    01/30/19 0303 01/31/19 0446  WBC 8.3 10.1  NEUTROABS 5.9 7.3  HGB 8.7* 8.7*  HCT 25.7* 26.2*  MCV 106.2* 106.5*  PLT 68* 88*   No results for input(s): LABPROT, INR in the last 72 hours.    Assessment/Plan: -58 year old patient with past medical history of alcohol abuse presents with GI bleed/melena.  Patient with low platelet counts.  Normal INR.  EGD January 28, 2019 showed gastritis and duodenitis. -Abnormal LFTs/AST.  Most likely from alcohol use. -Rectal  pain/rectal bleeding -hemoglobin stable. -Cardiomyopathy with EF of 15 to 20% -Severe hypokalemia.    Resolved.  -Rash.  Management per primary team.  Present prior to admission according to patient  -Ultrasound showed some gallbladder sludge.  It also showed gallbladder wall thickening which could be secondary from mild ascites.  Patient is denying any right upper quadrant abdominal pain.  Recommendations ------------------------ -Colonoscopy yesterday showed no evidence of active bleeding.  Few polyps removed please see report for details. -He is complaining of right upper quadrant abdominal pain now.  Will check HIDA scan.  Okay to discharge from GI standpoint if HIDA scan negative.  Recommend surgical consult if HIDA scan positive. -Repeat CBC and CMP in the morning if patient remains admitted. -GI will follow   Otis Brace MD, [place] 01/31/2019, 9:10 AM  Contact #  [phone]

## 2019-02-01 LAB — LIPASE, BLOOD: Lipase: 52 U/L — ABNORMAL HIGH (ref 11–51)

## 2019-02-01 LAB — COMPREHENSIVE METABOLIC PANEL
ALT: 33 U/L (ref 0–44)
AST: 87 U/L — ABNORMAL HIGH (ref 15–41)
Albumin: 2.6 g/dL — ABNORMAL LOW (ref 3.5–5.0)
Alkaline Phosphatase: 92 U/L (ref 38–126)
Anion gap: 8 (ref 5–15)
BUN: 7 mg/dL (ref 6–20)
CO2: 24 mmol/L (ref 22–32)
Calcium: 8.3 mg/dL — ABNORMAL LOW (ref 8.9–10.3)
Chloride: 100 mmol/L (ref 98–111)
Creatinine, Ser: 0.65 mg/dL (ref 0.61–1.24)
GFR calc Af Amer: >60 mL/min (ref >60)
GFR calc non Af Amer: >60 mL/min (ref >60)
Glucose, Bld: 86 mg/dL (ref 70–99)
Potassium: 4.3 mmol/L (ref 3.5–5.1)
Sodium: 132 mmol/L — ABNORMAL LOW (ref 135–145)
Total Bilirubin: 0.8 mg/dL (ref 0.3–1.2)
Total Protein: 5.4 g/dL — ABNORMAL LOW (ref 6.5–8.1)

## 2019-02-01 LAB — CBC
HCT: 26.1 % — ABNORMAL LOW (ref 39.0–52.0)
Hemoglobin: 8.6 g/dL — ABNORMAL LOW (ref 13.0–17.0)
MCH: 36 pg — ABNORMAL HIGH (ref 26.0–34.0)
MCHC: 33 g/dL (ref 30.0–36.0)
MCV: 109.2 fL — ABNORMAL HIGH (ref 80.0–100.0)
Platelets: 93 10*3/uL — ABNORMAL LOW (ref 150–400)
RBC: 2.39 MIL/uL — ABNORMAL LOW (ref 4.22–5.81)
RDW: 14.3 % (ref 11.5–15.5)
WBC: 8.9 10*3/uL (ref 4.0–10.5)
nRBC: 0 % (ref 0.0–0.2)

## 2019-02-01 MED ORDER — ROSUVASTATIN CALCIUM 10 MG PO TABS: 10.0000 mg | ORAL_TABLET | Freq: Every day | ORAL | 0 refills | Status: DC

## 2019-02-01 MED ORDER — PANTOPRAZOLE SODIUM 40 MG PO TBEC: 40.0000 mg | DELAYED_RELEASE_TABLET | Freq: Every day | ORAL | 0 refills | Status: DC

## 2019-02-01 MED ORDER — FOLIC ACID 1 MG PO TABS: 1.0000 mg | ORAL_TABLET | Freq: Every day | ORAL | 0 refills | Status: DC

## 2019-02-01 MED ORDER — CARVEDILOL 3.125 MG PO TABS: 3.1250 mg | ORAL_TABLET | Freq: Two times a day (BID) | ORAL | 0 refills | Status: DC

## 2019-02-01 MED ORDER — SACUBITRIL-VALSARTAN 24-26 MG PO TABS: 1.0000 | ORAL_TABLET | Freq: Two times a day (BID) | ORAL | 0 refills | Status: DC

## 2019-02-01 MED ORDER — THIAMINE HCL 100 MG PO TABS: 100.0000 mg | ORAL_TABLET | Freq: Every day | ORAL | 0 refills | Status: DC

## 2019-02-01 MED ORDER — ENSURE ENLIVE PO LIQD: 237.0000 mL | Freq: Three times a day (TID) | ORAL | Status: DC

## 2019-02-01 NOTE — Progress Notes (Signed)
Patient ambulated about 193 ft before stating he felt weak and needed to sit down. When asked if he will be able to walk a mile to his tent after d/c tomorrow, patient stated " no, I would like to stay at least another week and work on this, right now I feel weak."

## 2019-02-01 NOTE — Discharge Summary (Signed)
Trevor Williams, is a 58 y.o. male  DOB 1960-12-09  MRN [id number].  Admission date:  01/26/2019  Admitting Physician  [name], MD  Discharge Date:  02/01/2019   Primary MD  Placey, Audrea Muscat, NP  Recommendations for primary care physician for things to follow:  -Please check CBC, BMP during next visit. -Please continue counseling for alcohol abuse   Admission Diagnosis  Hypokalemia [E87.6] Hypomagnesemia [E83.42] Alcohol abuse [F10.10] Homeless single person [Z59.0] Fall, initial encounter [W19.XXXA] Anemia, unspecified type [D64.9] GI bleed [K92.2]   Discharge Diagnosis  Hypokalemia [E87.6] Hypomagnesemia [E83.42] Alcohol abuse [F10.10] Homeless single person [Z59.0] Fall, initial encounter [W19.XXXA] Anemia, unspecified type [D64.9] GI bleed [K92.2]    Active Problems:   GI bleed   Alcohol use   Homeless   Hypokalemia   Hypotensive episode   Fall at home, initial encounter      Past Medical History:  Diagnosis Date   Abnormal liver function tests    Acute respiratory failure ([place])    a. 07/2018 requiring intubation - CAP/CHF.   Alcohol abuse    Biventricular heart failure (HCC)    Cancer (HCC)    skin (left hand)   Delirium    a. h/o delirium while admitted   Emphysema of lung (HCC)    Homelessness    Macrocytic anemia    Mild CAD    a. cath 07/2018 60% ostial ramus otherwise OK.   Mitral regurgitation    NICM (nonischemic cardiomyopathy) (HCC)    Protein calorie malnutrition (HCC)    Tricuspid regurgitation     Past Surgical History:  Procedure Laterality Date   BIOPSY  01/28/2019   Procedure: BIOPSY;  Surgeon: Otis Brace, MD;  Location: [facility];  Service: Gastroenterology;;   BIOPSY  01/30/2019   Procedure: BIOPSY;  Surgeon: Otis Brace, MD;  Location: [facility];  Service: Gastroenterology;;   COLONOSCOPY WITH  PROPOFOL N/A 01/30/2019   Procedure: COLONOSCOPY WITH PROPOFOL;  Surgeon: Otis Brace, MD;  Location: [facility];  Service: Gastroenterology;  Laterality: N/A;   ESOPHAGOGASTRODUODENOSCOPY (EGD) WITH PROPOFOL N/A 01/28/2019   Procedure: ESOPHAGOGASTRODUODENOSCOPY (EGD) WITH PROPOFOL;  Surgeon: Otis Brace, MD;  Location: [facility];  Service: Gastroenterology;  Laterality: N/A;   HEMOSTASIS CLIP PLACEMENT  01/30/2019   Procedure: HEMOSTASIS CLIP PLACEMENT;  Surgeon: Otis Brace, MD;  Location: [facility];  Service: Gastroenterology;;   POLYPECTOMY  01/30/2019   Procedure: POLYPECTOMY;  Surgeon: Otis Brace, MD;  Location: [facility];  Service: Gastroenterology;;   RIGHT/LEFT HEART CATH AND CORONARY ANGIOGRAPHY N/A 08/09/2018   Procedure: RIGHT/LEFT HEART CATH AND CORONARY ANGIOGRAPHY;  Surgeon: Larey Dresser, MD;  Location: [facility];  Service: Cardiovascular;  Laterality: N/A;       History of present illness and  Hospital Course:     Kindly see H&P for history of present illness and admission details, please review complete Labs, Consult reports and Test reports for all details in brief  HPI  from the history and physical done on the day  of admission 01/26/2019  HPI: Trevor Williams is a 58 y.o. male with medical history significant of alcohol abuse, COPD, tobacco abuse, and lung cancer who presents for multiple falls with generalized weakness.  He states that over the last 2 to 3 weeks he has noticed increasing weakness as well as lightheadedness and dizziness.  He describes the weakness as severe.  He is unable to identify any exacerbating or remitting factors.  He admits to associated dark and tarry stools.  He also admits to associated shortness of breath however denies any chest pain.  He denies any syncopal events despite the multiple falls.  He denies any injuries from the falls.  He reports no similar events in the past.  Of note, the patient  is homeless.  He denies any fever, chills, nausea, vomiting, abdominal pain, dysuria.  ED Course: In the ED the patient was found to be mildly hypotensive however responded to IV fluid.  He was also found to be severely hypokalemic and hypomagnesemic and received replacement for both of these.  GI was called and consulted with no acute intervention required overnight.  Hospital Course   Rectal bleed:  -Colonoscopy revealed sigmoid and cecal polyps that were removed.  Hemorrhoids were noted as well.  No active source of GI bleed -EGD revealed duodenitis and gastritis.  He will be discharged on Protonix biopsies are benign, no H. pylori -No further bleeding. -Hemoglobin has been stable.   Alcohol Abuse with concerns for Withdrawal: -No evidence of withdrawal during hospital stay  Homeless: -Social work team help greatly appreciated,   Abnormal LFT:  -Most likely in the setting of alcohol abuse. -Patient with complains of right upper quadrant pain, HIDA scan was done, no evidence of cholecystitis, this is most likely due to alcohol abuse, LFTs trending down.  Has no abdominal pain today -Acute hepatitis panel came back negative.  Nonischemic cardiomyopathy with history of biventricular heart failure of mild CAD History of mitral regurgitation and history of tricuspid regurgitation -Stable.  No symptoms. -Has a history an EF of 15 to 20% -Currently holding his cardiac medications including carvedilol 3.125 mg p.o. twice daily, Entresto 24-26 1 tab p.o. twice daily -Continue rosuvastatin 10 mg p.o. nightly -Iwas given prescriptions, he fills his medications and his follow-up with [facility] she is instructed to follow  Hypotension but has a Hx of Hypertension -resolved  Hypokalemia -Resolved.  Hypophosphatemia -Resolved.    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   Contact information: [address] [place] [address] [phone]             Discharge Instructions  and  Discharge Medications    Discharge Instructions    Discharge instructions   Complete by: As directed    Follow with Primary MD   Activity: As tolerated with Full fall precautions use walker/cane & assistance as needed   Disposition Home    Diet: Low-fat, soft diet    On your next visit with your primary care physician please Get Medicines reviewed and adjusted.   Please request your Prim.MD to go over all Hospital Tests and Procedure/Radiological  results at the follow up, please get all Hospital records sent to your Prim MD by signing hospital release before you go home.   If you experience worsening of your admission symptoms, develop shortness of breath, life threatening emergency, suicidal or homicidal thoughts you must seek medical attention immediately by calling 911 or calling your MD immediately  if symptoms less severe.  You Must read complete instructions/literature along with all the possible adverse reactions/side effects for all the Medicines you take and that have been prescribed to you. Take any new Medicines after you have completely understood and accpet all the possible adverse reactions/side effects.   Do not drive, operating heavy machinery, perform activities at heights, swimming or participation in water activities or provide baby sitting services if your were admitted for syncope or siezures until you have seen by Primary MD or a Neurologist and advised to do so again.  Do not drive  when taking Pain medications.    Do not take more than prescribed Pain, Sleep and Anxiety Medications  Special Instructions: If you have smoked or chewed Tobacco  in the last 2 yrs please stop smoking, stop any regular Alcohol  and or any Recreational drug use.  Wear Seat belts while driving.   Please note  You were cared for by a hospitalist during your hospital stay. If you have any questions about your discharge medications or the care you received while you were in the hospital after you are discharged, you can call the unit and asked to speak with the hospitalist on call if the hospitalist that took care of you is not available. Once you are discharged, your primary care physician will handle any further medical issues. Please note that NO REFILLS for any discharge medications will be authorized once you are discharged, as it is imperative that you return to your primary care physician (or establish a relationship with a primary care physician if you do not have one) for your aftercare needs so that they can reassess your need for medications and monitor your lab values.   Increase activity slowly   Complete by: As directed      Allergies as of 02/01/2019      Reactions   Doxycycline Rash   Rash noted after administration of vancomycin, doxycycline, and ceftriaxone. Unclear cause of rash.   Ibuprofen Rash   Rocephin [ceftriaxone] Rash   Rash noted after administration of vancomycin, doxycycline, and ceftriaxone. Unclear cause of rash.   Tylenol [acetaminophen] Rash   Vancomycin Rash   Rash noted after administration of vancomycin, doxycycline, and ceftriaxone. Unclear cause of rash.      Medication List    STOP taking these medications   diclofenac sodium 1 % Gel Commonly known as: VOLTAREN   predniSONE 10 MG tablet Commonly known as: DELTASONE     TAKE these medications   carvedilol 3.125 MG tablet Commonly known as: COREG Take 1 tablet (3.125 mg total) by mouth 2  (two) times daily with a meal.   feeding supplement (ENSURE ENLIVE) Liqd Take 237 mLs by mouth 3 (three) times daily between meals.   folic acid 1 MG tablet Commonly known as: FOLVITE Take 1 tablet (1 mg total) by mouth daily.   nicotine 21 mg/24hr patch Commonly known as: NICODERM CQ - dosed in mg/24 hours Place 1 patch (21 mg total) onto the skin daily.   pantoprazole 40 MG tablet Commonly known as: Protonix Take 1 tablet (40 mg total) by mouth daily.   rosuvastatin  10 MG tablet Commonly known as: CRESTOR Take 1 tablet (10 mg total) by mouth daily at 6 PM.   sacubitril-valsartan 24-26 MG Commonly known as: ENTRESTO Take 1 tablet by mouth 2 (two) times daily.   thiamine 100 MG tablet Take 1 tablet (100 mg total) by mouth daily.         Diet and Activity recommendation: See Discharge Instructions above   Consults obtained -  GI   Major procedures and Radiology Reports - PLEASE review detailed and final reports for all details, in brief -   EGD  Findings: A non-obstructing Schatzki ring was found in the distal esophagus. There is no endoscopic evidence of bleeding, ulcerations or varices in  the entire esophagus. Diffuse moderate inflammation characterized by congestion (edema),  erythema and friability was found in the entire examined stomach.  Biopsies were taken with a cold forceps for histology. The cardia and gastric fundus were normal on retroflexion. Localized mild inflammation characterized by congestion (edema) and  erythema was found in the duodenal bulb. The first portion of the duodenum and second portion of the duodenum  were normal. A small hiatal hernia was present. Impression: - Non-obstructing Schatzki ring. - Gastritis. Biopsied. - Duodenitis. - Normal first portion of the duodenum and second   portion of the duodenum.   Colonoscopy  - Preparation of the colon was inadequate. - Hemorrhoids found on perianal exam. - Stool in the entire examined colon. - One 10 mm polyp in the cecum, removed with a hot snare. Resected and retrieved. Clip was placed. - One 12 mm polyp in the sigmoid colon at 45 cm proximal to the anus, removed piecemeal using a hot snare. Resected and retrieved. - One 4 mm polyp in the sigmoid colon, removed piecemeal using a cold biopsy forceps. Resected and retrieved. - One 7 mm polyp in the proximal rectum, removed with a hot snare. Resected and retrieved. - Granular and nodular mucosa in the distal rectum. Biopsied. - Internal hemorrhoids.  Dg Chest 2 View  Result Date: 01/26/2019 CLINICAL DATA:  Fall EXAM: CHEST - 2 VIEW COMPARISON:  12/10/2018 FINDINGS: The heart size and mediastinal contours are within normal limits. Pulmonary hyperinflation and emphysema. Disc degenerative disease of the thoracic spine. Multiple nonacute left-sided rib fractures. IMPRESSION: Emphysema without acute abnormality of the lungs. Electronically Signed   By: Eddie Candle M.D.   On: 01/26/2019 16:43   Ct Head Wo Contrast  Result Date: 01/26/2019 CLINICAL DATA:  Headache, post-traumatic, fall 2 days ago EXAM: CT HEAD WITHOUT CONTRAST CT CERVICAL SPINE WITHOUT CONTRAST TECHNIQUE: Multidetector CT imaging of the head and cervical spine was performed following the standard protocol without intravenous contrast. Multiplanar CT image reconstructions of the cervical spine were also generated. COMPARISON:  09/07/2016 FINDINGS: CT HEAD FINDINGS Brain: No evidence of acute infarction, hemorrhage, hydrocephalus, extra-axial collection or mass lesion/mass effect. Redemonstrated right MCA territory encephalomalacia. Periventricular white matter hypodensity and volume loss, increased compared to prior examination. Vascular: No hyperdense vessel or  unexpected calcification. Skull: Normal. Negative for fracture or focal lesion. Sinuses/Orbits: No acute finding. Other: None. CT CERVICAL SPINE FINDINGS Alignment: Normal. Skull base and vertebrae: No acute fracture. No primary bone lesion or focal pathologic process. Soft tissues and spinal canal: No prevertebral fluid or swelling. No visible canal hematoma. Disc levels: Moderate multilevel disc space height loss and osteophytosis. Upper chest: Emphysema in the lung apices. Other: None. IMPRESSION: 1.  No acute intracranial pathology. 2. Redemonstrated right MCA territory encephalomalacia, in keeping with  prior infarction. 3. Small-vessel white matter disease and global volume loss, notably increased compared to prior examination dated 09/07/2016. 4. No fracture or static subluxation of the cervical spine. Moderate multilevel disc degenerative disease and osteophytosis. 5.  Emphysema (ICD10-J43.9). Electronically Signed   By: Eddie Candle M.D.   On: 01/26/2019 16:43   Ct Cervical Spine Wo Contrast  Result Date: 01/26/2019 CLINICAL DATA:  Headache, post-traumatic, fall 2 days ago EXAM: CT HEAD WITHOUT CONTRAST CT CERVICAL SPINE WITHOUT CONTRAST TECHNIQUE: Multidetector CT imaging of the head and cervical spine was performed following the standard protocol without intravenous contrast. Multiplanar CT image reconstructions of the cervical spine were also generated. COMPARISON:  09/07/2016 FINDINGS: CT HEAD FINDINGS Brain: No evidence of acute infarction, hemorrhage, hydrocephalus, extra-axial collection or mass lesion/mass effect. Redemonstrated right MCA territory encephalomalacia. Periventricular white matter hypodensity and volume loss, increased compared to prior examination. Vascular: No hyperdense vessel or unexpected calcification. Skull: Normal. Negative for fracture or focal lesion. Sinuses/Orbits: No acute finding. Other: None. CT CERVICAL SPINE FINDINGS Alignment: Normal. Skull base and vertebrae: No  acute fracture. No primary bone lesion or focal pathologic process. Soft tissues and spinal canal: No prevertebral fluid or swelling. No visible canal hematoma. Disc levels: Moderate multilevel disc space height loss and osteophytosis. Upper chest: Emphysema in the lung apices. Other: None. IMPRESSION: 1.  No acute intracranial pathology. 2. Redemonstrated right MCA territory encephalomalacia, in keeping with prior infarction. 3. Small-vessel white matter disease and global volume loss, notably increased compared to prior examination dated 09/07/2016. 4. No fracture or static subluxation of the cervical spine. Moderate multilevel disc degenerative disease and osteophytosis. 5.  Emphysema (ICD10-J43.9). Electronically Signed   By: Eddie Candle M.D.   On: 01/26/2019 16:43   Nm Hepato W/eject Fract  Result Date: 01/31/2019 CLINICAL DATA:  Right upper quadrant pain EXAM: NUCLEAR MEDICINE HEPATOBILIARY IMAGING WITH GALLBLADDER EF TECHNIQUE: Sequential images of the abdomen were obtained out to 60 minutes following intravenous administration of radiopharmaceutical. After oral ingestion of Ensure, gallbladder ejection fraction was determined. At 60 min, normal ejection fraction is greater than 33%. RADIOPHARMACEUTICALS:  5.1 mCi Tc-67m  Choletec IV COMPARISON:  Ultrasound 01/27/2019 FINDINGS: Prompt uptake and biliary excretion of activity by the liver is seen. Gallbladder activity is visualized, consistent with patency of cystic duct. Biliary activity passes into small bowel, consistent with patent common bile duct. Calculated gallbladder ejection fraction is 93%. (Normal gallbladder ejection fraction with Ensure is greater than 33%.) IMPRESSION: Normal study. Electronically Signed   By: Rolm Baptise M.D.   On: 01/31/2019 19:15   US Abdomen Limited Ruq  Result Date: 01/27/2019 CLINICAL DATA:  Abnormal LFTs. EXAM: ULTRASOUND ABDOMEN LIMITED RIGHT UPPER QUADRANT COMPARISON:  Abdomen 07/30/2018. FINDINGS:  Gallbladder: Gallbladder sludge noted. Gallbladder wall thickness up to 7.5 mm. Prominent soft tissue edema noted about the gallbladder. Mild pericholecystic fluid collection cannot be excluded. Findings suggest the possibility of cholecystitis. Common bile duct: Diameter: 5.1 mm Liver: Increase hepatic echogenicity consistent fatty infiltration or hepatocellular disease. Liver appears prominent. Hepatomegaly cannot be excluded. Portal vein is patent on color Doppler imaging with normal direction of blood flow towards the liver. Other: Mild ascites cannot be excluded. IMPRESSION: 1. Gallbladder sludge noted. Gallbladder wall thickness of 7.5 mm. Prominent soft tissue edema noted about the gallbladder. Mild pericholecystic fluid collection cannot be excluded. Findings suggest gallbladder sludge with cholecystitis. 2. Increased hepatic echogenicity consistent fatty infiltration or hepatocellular disease. Hepatomegaly cannot be excluded. 3.  Mild ascites cannot be excluded. Electronically  Signed   By: Marcello Moores  Register   On: 01/27/2019 10:15    Micro Results     Recent Results (from the past 240 hour(s))  SARS CORONAVIRUS 2 (TAT 6-24 HRS) Nasopharyngeal Nasopharyngeal Swab     Status: None   Collection Time: 01/26/19  5:08 PM   Specimen: Nasopharyngeal Swab  Result Value Ref Range Status   SARS Coronavirus 2 NEGATIVE NEGATIVE Final    Comment: (NOTE) SARS-CoV-2 target nucleic acids are NOT DETECTED. The SARS-CoV-2 RNA is generally detectable in upper and lower respiratory specimens during the acute phase of infection. Negative results do not preclude SARS-CoV-2 infection, do not rule out co-infections with other pathogens, and should not be used as the sole basis for treatment or other patient management decisions. Negative results must be combined with clinical observations, patient history, and epidemiological information. The expected result is Negative. Fact Sheet for  Patients: SugarRoll.be Fact Sheet for Healthcare Providers: https://www.woods-mathews.com/ This test is not yet approved or cleared by the [place] FDA and  has been authorized for detection and/or diagnosis of SARS-CoV-2 by FDA under an Emergency Use Authorization (EUA). This EUA will remain  in effect (meaning this test can be used) for the duration of the COVID-19 declaration under Section 56 4(b)(1) of the Act, 21 U.S.C. section 360bbb-3(b)(1), unless the authorization is terminated or revoked sooner. Performed at [facility], [place] [address], [place], [place] [address]        Today   Subjective:   Trevor Williams today denies any more abdominal pain, able to tolerate oral intake, denies fever, chills, nausea, vomiting or diarrhea.  Objective:   Blood pressure 115/86, pulse 83, temperature 99.2 F (37.3 C), temperature source Oral, resp. rate 18, height 5\' 7"  (1.702 m), weight 54 kg, SpO2 95 %.   Intake/Output Summary (Last 24 hours) at 02/01/2019 1457 Last data filed at 02/01/2019 [id number] Gross per 24 hour  Intake 120 ml  Output 250 ml  Net -130 ml    Exam Awake Alert, Oriented x 3, No new F.N deficits, Normal affect, thin appearing Symmetrical Chest wall movement, Good air movement bilaterally, CTAB RRR,No Gallops,Rubs or new Murmurs, No Parasternal Heave +ve B.Sounds, Abd Soft, Non tender,  No rebound -guarding or rigidity. No Cyanosis, Clubbing or edema, No new Rash or bruise  Data Review   CBC w Diff:  Lab Results  Component Value Date   WBC 8.9 02/01/2019   HGB 8.6 (L) 02/01/2019   HCT 26.1 (L) 02/01/2019   HCT 21.0 (L) 01/27/2019   PLT 93 (L) 02/01/2019   LYMPHOPCT 17 01/31/2019   MONOPCT 8 01/31/2019   EOSPCT 2 01/31/2019   BASOPCT 1 01/31/2019    CMP:  Lab Results  Component Value Date   NA 132 (L) 02/01/2019   K 4.3 02/01/2019   CL 100 02/01/2019   CO2 24 02/01/2019   BUN 7 02/01/2019    CREATININE 0.65 02/01/2019   PROT 5.4 (L) 02/01/2019   ALBUMIN 2.6 (L) 02/01/2019   BILITOT 0.8 02/01/2019   ALKPHOS 92 02/01/2019   AST 87 (H) 02/01/2019   ALT 33 02/01/2019  .   Total Time in preparing paper work, data evaluation and todays exam - 16 minutes  Phillips Climes M.D on 02/01/2019 at 2:57 PM  [facility]   Office  [phone]

## 2019-02-01 NOTE — TOC Progression Note (Signed)
Transition of Care [facility]) - Progression Note    Patient Details  Name: Trevor Williams MRN: [id number] Date of Birth: 04-05-1960  [facility] CM/SW Contact  Joaquin Courts, RN Phone Number: 02/01/2019, 2:28 PM  Clinical Narrative:    CM received consult for pcp and homelessness. CM noted weekday CM has provided patient with resources for homelessness and noted patient is active with the [facility] where he receives medications and sees a pcp.  No new CM needs identified, will sign off.         Expected Discharge Plan and Services                                                 Social Determinants of Health (SDOH) Interventions    Readmission Risk Interventions No flowsheet data found.

## 2019-02-01 NOTE — Progress Notes (Addendum)
Went over discharge papers with patient.  All questions answered.  Prescriptions given.  Pt to be discharged in the morning.

## 2019-02-01 NOTE — Discharge Instructions (Signed)
Follow with Primary MD   Activity: As tolerated with Full fall precautions use walker/cane & assistance as needed   Disposition Home    Diet: Low-fat, soft diet    On your next visit with your primary care physician please Get Medicines reviewed and adjusted.   Please request your Prim.MD to go over all Hospital Tests and Procedure/Radiological results at the follow up, please get all Hospital records sent to your Prim MD by signing hospital release before you go home.   If you experience worsening of your admission symptoms, develop shortness of breath, life threatening emergency, suicidal or homicidal thoughts you must seek medical attention immediately by calling 911 or calling your MD immediately  if symptoms less severe.  You Must read complete instructions/literature along with all the possible adverse reactions/side effects for all the Medicines you take and that have been prescribed to you. Take any new Medicines after you have completely understood and accpet all the possible adverse reactions/side effects.   Do not drive, operating heavy machinery, perform activities at heights, swimming or participation in water activities or provide baby sitting services if your were admitted for syncope or siezures until you have seen by Primary MD or a Neurologist and advised to do so again.  Do not drive when taking Pain medications.    Do not take more than prescribed Pain, Sleep and Anxiety Medications  Special Instructions: If you have smoked or chewed Tobacco  in the last 2 yrs please stop smoking, stop any regular Alcohol  and or any Recreational drug use.  Wear Seat belts while driving.   Please note  You were cared for by a hospitalist during your hospital stay. If you have any questions about your discharge medications or the care you received while you were in the hospital after you are discharged, you can call the unit and asked to speak with the hospitalist on call if  the hospitalist that took care of you is not available. Once you are discharged, your primary care physician will handle any further medical issues. Please note that NO REFILLS for any discharge medications will be authorized once you are discharged, as it is imperative that you return to your primary care physician (or establish a relationship with a primary care physician if you do not have one) for your aftercare needs so that they can reassess your need for medications and monitor your lab values.

## 2019-02-01 NOTE — Progress Notes (Signed)
Patient ID: Trevor Williams, male   DOB: [birth year], 58 y.o.   MRN: [id number]  Sitting up in bed. No complaints. HIDA scan negative.   Ok to D/C from a GI standpoint. F/U with Dr. Alessandra Bevels in 3-4 weeks. Will sign off. Call if questions.

## 2019-02-02 MED ORDER — OXYCODONE HCL 5 MG PO TABS
5.0000 mg | ORAL_TABLET | Freq: Once | ORAL | Status: AC
  Administered 2019-02-02: 5 mg via ORAL
  Filled 2019-02-02: qty 1

## 2019-02-02 NOTE — Progress Notes (Signed)
Patient ambulated with NT assist in hallway (approx 700 feet), MD aware and will proceed with discharge

## 2019-02-02 NOTE — Progress Notes (Signed)
Trevor Williams, is a 58 y.o. male  DOB June 06, 1960  MRN [id number].  Admission date:  01/26/2019  Admitting Physician  [name], MD  Discharge Date:  02/02/2019   Primary MD  Placey, Audrea Muscat, NP  No significant update from yesterday's discharge summary, patient was kept overnight, as it was too late for him to be discharged, this morning he was ambulated in the hallway, he took 2 full labs, 720 feet, he tolerated very well, he will be discharged today, instructed to follow with his clinic in [facility] on Monday to refill his meds to schedule a follow-up visit.  Recommendations for primary care physician for things to follow:  -Please check CBC, BMP during next visit. -Please continue counseling for alcohol abuse   Admission Diagnosis  Hypokalemia [E87.6] Hypomagnesemia [E83.42] Alcohol abuse [F10.10] Homeless single person [Z59.0] Fall, initial encounter [W19.XXXA] Anemia, unspecified type [D64.9] GI bleed [K92.2]   Discharge Diagnosis  Hypokalemia [E87.6] Hypomagnesemia [E83.42] Alcohol abuse [F10.10] Homeless single person [Z59.0] Fall, initial encounter [W19.XXXA] Anemia, unspecified type [D64.9] GI bleed [K92.2]    Active Problems:   GI bleed   Alcohol use   Homeless   Hypokalemia   Hypotensive episode   Fall at home, initial encounter      Past Medical History:  Diagnosis Date   Abnormal liver function tests    Acute respiratory failure ([place])    a. 07/2018 requiring intubation - CAP/CHF.   Alcohol abuse    Biventricular heart failure (HCC)    Cancer (HCC)    skin (left hand)   Delirium    a. h/o delirium while admitted   Emphysema of lung (HCC)    Homelessness    Macrocytic anemia    Mild CAD    a. cath 07/2018 60% ostial ramus otherwise OK.   Mitral regurgitation    NICM (nonischemic cardiomyopathy) (HCC)    Protein calorie malnutrition (HCC)     Tricuspid regurgitation     Past Surgical History:  Procedure Laterality Date   BIOPSY  01/28/2019   Procedure: BIOPSY;  Surgeon: Otis Brace, MD;  Location: [facility];  Service: Gastroenterology;;   BIOPSY  01/30/2019   Procedure: BIOPSY;  Surgeon: Otis Brace, MD;  Location: [facility];  Service: Gastroenterology;;   COLONOSCOPY WITH PROPOFOL N/A 01/30/2019   Procedure: COLONOSCOPY WITH PROPOFOL;  Surgeon: Otis Brace, MD;  Location: [facility];  Service: Gastroenterology;  Laterality: N/A;   ESOPHAGOGASTRODUODENOSCOPY (EGD) WITH PROPOFOL N/A 01/28/2019   Procedure: ESOPHAGOGASTRODUODENOSCOPY (EGD) WITH PROPOFOL;  Surgeon: Otis Brace, MD;  Location: [facility];  Service: Gastroenterology;  Laterality: N/A;   HEMOSTASIS CLIP PLACEMENT  01/30/2019   Procedure: HEMOSTASIS CLIP PLACEMENT;  Surgeon: Otis Brace, MD;  Location: [facility];  Service: Gastroenterology;;   POLYPECTOMY  01/30/2019   Procedure: POLYPECTOMY;  Surgeon: Otis Brace, MD;  Location: [facility];  Service: Gastroenterology;;   RIGHT/LEFT HEART CATH AND CORONARY ANGIOGRAPHY N/A 08/09/2018   Procedure: RIGHT/LEFT HEART CATH AND CORONARY ANGIOGRAPHY;  Surgeon: Larey Dresser, MD;  Location:  [facility];  Service: Cardiovascular;  Laterality: N/A;       History of present illness and  Hospital Course:     Kindly see H&P for history of present illness and admission details, please review complete Labs, Consult reports and Test reports for all details in brief  HPI  from the history and physical done on the day of admission 01/26/2019  HPI: [name] is a 58 y.o. male with medical history significant of alcohol abuse, COPD, tobacco abuse, and lung cancer who presents for multiple falls with generalized weakness.  He states that over the last 2 to 3 weeks he has noticed increasing weakness as well as lightheadedness and dizziness.  He describes the weakness  as severe.  He is unable to identify any exacerbating or remitting factors.  He admits to associated dark and tarry stools.  He also admits to associated shortness of breath however denies any chest pain.  He denies any syncopal events despite the multiple falls.  He denies any injuries from the falls.  He reports no similar events in the past.  Of note, the patient is homeless.  He denies any fever, chills, nausea, vomiting, abdominal pain, dysuria.  ED Course: In the ED the patient was found to be mildly hypotensive however responded to IV fluid.  He was also found to be severely hypokalemic and hypomagnesemic and received replacement for both of these.  GI was called and consulted with no acute intervention required overnight.  Hospital Course   Rectal bleed:  -Colonoscopy revealed sigmoid and cecal polyps that were removed.  Hemorrhoids were noted as well.  No active source of GI bleed -EGD revealed duodenitis and gastritis.  He will be discharged on Protonix biopsies are benign, no H. pylori -No further bleeding. -Hemoglobin has been stable.   Alcohol Abuse with concerns for Withdrawal: -No evidence of withdrawal during hospital stay  Homeless: -Social work team help greatly appreciated,   Abnormal LFT:  -Most likely in the setting of alcohol abuse. -Patient with complains of right upper quadrant pain, HIDA scan was done, no evidence of cholecystitis, this is most likely due to alcohol abuse, LFTs trending down.  Has no abdominal pain today -Acute hepatitis panel came back negative.  Nonischemic cardiomyopathy with history of biventricular heart failure of mild CAD History of mitral regurgitation and history of tricuspid regurgitation -Stable.  No symptoms. -Has a history an EF of 15 to 20% -Currently holding his cardiac medications including carvedilol 3.125 mg p.o. twice daily, Entresto 24-26 1 tab p.o. twice daily -Continue rosuvastatin 10 mg p.o. nightly -Iwas given  prescriptions, he fills his medications and his follow-up with [facility] she is instructed to follow  Hypotension but has a Hx of Hypertension -resolved  Hypokalemia -Resolved.  Hypophosphatemia -Resolved.    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   Contact information: [place] [address] [phone]             Discharge Instructions  and  Discharge Medications    Discharge Instructions    Discharge instructions   Complete by: As directed  Follow with Primary MD   Activity: As tolerated with Full fall precautions use walker/cane & assistance as needed   Disposition Home    Diet: Low-fat, soft diet    On your next visit with your primary care physician please Get Medicines reviewed and adjusted.   Please request your Prim.MD to go over all Hospital Tests and Procedure/Radiological results at the follow up, please get all Hospital records sent to your Prim MD by signing hospital release before you go home.   If you experience worsening of your admission symptoms, develop shortness of breath, life threatening emergency, suicidal or homicidal thoughts you must seek medical attention immediately by calling 911 or calling your MD immediately  if symptoms less severe.  You Must read complete instructions/literature along with all the possible adverse reactions/side effects for all the Medicines you take and that have been prescribed to you. Take any new Medicines after you have completely understood and accpet all the possible adverse reactions/side  effects.   Do not drive, operating heavy machinery, perform activities at heights, swimming or participation in water activities or provide baby sitting services if your were admitted for syncope or siezures until you have seen by Primary MD or a Neurologist and advised to do so again.  Do not drive when taking Pain medications.    Do not take more than prescribed Pain, Sleep and Anxiety Medications  Special Instructions: If you have smoked or chewed Tobacco  in the last 2 yrs please stop smoking, stop any regular Alcohol  and or any Recreational drug use.  Wear Seat belts while driving.   Please note  You were cared for by a hospitalist during your hospital stay. If you have any questions about your discharge medications or the care you received while you were in the hospital after you are discharged, you can call the unit and asked to speak with the hospitalist on call if the hospitalist that took care of you is not available. Once you are discharged, your primary care physician will handle any further medical issues. Please note that NO REFILLS for any discharge medications will be authorized once you are discharged, as it is imperative that you return to your primary care physician (or establish a relationship with a primary care physician if you do not have one) for your aftercare needs so that they can reassess your need for medications and monitor your lab values.   Increase activity slowly   Complete by: As directed      Allergies as of 02/02/2019      Reactions   Doxycycline Rash   Rash noted after administration of vancomycin, doxycycline, and ceftriaxone. Unclear cause of rash.   Ibuprofen Rash   Rocephin [ceftriaxone] Rash   Rash noted after administration of vancomycin, doxycycline, and ceftriaxone. Unclear cause of rash.   Tylenol [acetaminophen] Rash   Vancomycin Rash   Rash noted after administration of vancomycin, doxycycline, and ceftriaxone. Unclear cause of rash.        Medication List    STOP taking these medications   diclofenac sodium 1 % Gel Commonly known as: VOLTAREN   predniSONE 10 MG tablet Commonly known as: DELTASONE     TAKE these medications   carvedilol 3.125 MG tablet Commonly known as: COREG Take 1 tablet (3.125 mg total) by mouth 2 (two) times daily with a meal. Notes to patient: tonight   feeding supplement (ENSURE ENLIVE) Liqd Take 237 mLs by mouth 3 (three) times daily between meals.  folic acid 1 MG tablet Commonly known as: FOLVITE Take 1 tablet (1 mg total) by mouth daily.   nicotine 21 mg/24hr patch Commonly known as: NICODERM CQ - dosed in mg/24 hours Place 1 patch (21 mg total) onto the skin daily.   pantoprazole 40 MG tablet Commonly known as: Protonix Take 1 tablet (40 mg total) by mouth daily.   rosuvastatin 10 MG tablet Commonly known as: CRESTOR Take 1 tablet (10 mg total) by mouth daily at 6 PM.   sacubitril-valsartan 24-26 MG Commonly known as: ENTRESTO Take 1 tablet by mouth 2 (two) times daily.   thiamine 100 MG tablet Take 1 tablet (100 mg total) by mouth daily.         Diet and Activity recommendation: See Discharge Instructions above   Consults obtained -  GI   Major procedures and Radiology Reports - PLEASE review detailed and final reports for all details, in brief -   EGD  Findings: A non-obstructing Schatzki ring was found in the distal esophagus. There is no endoscopic evidence of bleeding, ulcerations or varices in  the entire esophagus. Diffuse moderate inflammation characterized by congestion (edema),  erythema and friability was found in the entire examined stomach.  Biopsies were taken with a cold forceps for histology. The cardia and gastric fundus were normal on retroflexion. Localized mild inflammation characterized by congestion (edema) and  erythema was found in the duodenal bulb. The first portion of the  duodenum and second portion of the duodenum  were normal. A small hiatal hernia was present. Impression: - Non-obstructing Schatzki ring. - Gastritis. Biopsied. - Duodenitis. - Normal first portion of the duodenum and second  portion of the duodenum.   Colonoscopy  - Preparation of the colon was inadequate. - Hemorrhoids found on perianal exam. - Stool in the entire examined colon. - One 10 mm polyp in the cecum, removed with a hot snare. Resected and retrieved. Clip was placed. - One 12 mm polyp in the sigmoid colon at 45 cm proximal to the anus, removed piecemeal using a hot snare. Resected and retrieved. - One 4 mm polyp in the sigmoid colon, removed piecemeal using a cold biopsy forceps. Resected and retrieved. - One 7 mm polyp in the proximal rectum, removed with a hot snare. Resected and retrieved. - Granular and nodular mucosa in the distal rectum. Biopsied. - Internal hemorrhoids.  Dg Chest 2 View  Result Date: 01/26/2019 CLINICAL DATA:  Fall EXAM: CHEST - 2 VIEW COMPARISON:  12/10/2018 FINDINGS: The heart size and mediastinal contours are within normal limits. Pulmonary hyperinflation and emphysema. Disc degenerative disease of the thoracic spine. Multiple nonacute left-sided rib fractures. IMPRESSION: Emphysema without acute abnormality of the lungs. Electronically Signed   By: Eddie Candle M.D.   On: 01/26/2019 16:43   Ct Head Wo Contrast  Result Date: 01/26/2019 CLINICAL DATA:  Headache, post-traumatic, fall 2 days ago EXAM: CT HEAD WITHOUT CONTRAST CT CERVICAL SPINE WITHOUT CONTRAST TECHNIQUE: Multidetector CT imaging of the head and cervical spine was performed following the standard protocol without intravenous contrast. Multiplanar CT image reconstructions of the cervical spine were also generated. COMPARISON:  09/07/2016  FINDINGS: CT HEAD FINDINGS Brain: No evidence of acute infarction, hemorrhage, hydrocephalus, extra-axial collection or mass lesion/mass effect. Redemonstrated right MCA territory encephalomalacia. Periventricular white matter hypodensity and volume loss, increased compared to prior examination. Vascular: No hyperdense vessel or unexpected calcification. Skull: Normal. Negative for fracture or focal lesion. Sinuses/Orbits: No acute finding. Other: None. CT CERVICAL SPINE FINDINGS  Alignment: Normal. Skull base and vertebrae: No acute fracture. No primary bone lesion or focal pathologic process. Soft tissues and spinal canal: No prevertebral fluid or swelling. No visible canal hematoma. Disc levels: Moderate multilevel disc space height loss and osteophytosis. Upper chest: Emphysema in the lung apices. Other: None. IMPRESSION: 1.  No acute intracranial pathology. 2. Redemonstrated right MCA territory encephalomalacia, in keeping with prior infarction. 3. Small-vessel white matter disease and global volume loss, notably increased compared to prior examination dated 09/07/2016. 4. No fracture or static subluxation of the cervical spine. Moderate multilevel disc degenerative disease and osteophytosis. 5.  Emphysema (ICD10-J43.9). Electronically Signed   By: Eddie Candle M.D.   On: 01/26/2019 16:43   Ct Cervical Spine Wo Contrast  Result Date: 01/26/2019 CLINICAL DATA:  Headache, post-traumatic, fall 2 days ago EXAM: CT HEAD WITHOUT CONTRAST CT CERVICAL SPINE WITHOUT CONTRAST TECHNIQUE: Multidetector CT imaging of the head and cervical spine was performed following the standard protocol without intravenous contrast. Multiplanar CT image reconstructions of the cervical spine were also generated. COMPARISON:  09/07/2016 FINDINGS: CT HEAD FINDINGS Brain: No evidence of acute infarction, hemorrhage, hydrocephalus, extra-axial collection or mass lesion/mass effect. Redemonstrated right MCA territory encephalomalacia.  Periventricular white matter hypodensity and volume loss, increased compared to prior examination. Vascular: No hyperdense vessel or unexpected calcification. Skull: Normal. Negative for fracture or focal lesion. Sinuses/Orbits: No acute finding. Other: None. CT CERVICAL SPINE FINDINGS Alignment: Normal. Skull base and vertebrae: No acute fracture. No primary bone lesion or focal pathologic process. Soft tissues and spinal canal: No prevertebral fluid or swelling. No visible canal hematoma. Disc levels: Moderate multilevel disc space height loss and osteophytosis. Upper chest: Emphysema in the lung apices. Other: None. IMPRESSION: 1.  No acute intracranial pathology. 2. Redemonstrated right MCA territory encephalomalacia, in keeping with prior infarction. 3. Small-vessel white matter disease and global volume loss, notably increased compared to prior examination dated 09/07/2016. 4. No fracture or static subluxation of the cervical spine. Moderate multilevel disc degenerative disease and osteophytosis. 5.  Emphysema (ICD10-J43.9). Electronically Signed   By: Eddie Candle M.D.   On: 01/26/2019 16:43   Nm Hepato W/eject Fract  Result Date: 01/31/2019 CLINICAL DATA:  Right upper quadrant pain EXAM: NUCLEAR MEDICINE HEPATOBILIARY IMAGING WITH GALLBLADDER EF TECHNIQUE: Sequential images of the abdomen were obtained out to 60 minutes following intravenous administration of radiopharmaceutical. After oral ingestion of Ensure, gallbladder ejection fraction was determined. At 60 min, normal ejection fraction is greater than 33%. RADIOPHARMACEUTICALS:  5.1 mCi Tc-46m  Choletec IV COMPARISON:  Ultrasound 01/27/2019 FINDINGS: Prompt uptake and biliary excretion of activity by the liver is seen. Gallbladder activity is visualized, consistent with patency of cystic duct. Biliary activity passes into small bowel, consistent with patent common bile duct. Calculated gallbladder ejection fraction is 93%. (Normal gallbladder  ejection fraction with Ensure is greater than 33%.) IMPRESSION: Normal study. Electronically Signed   By: Rolm Baptise M.D.   On: 01/31/2019 19:15   US Abdomen Limited Ruq  Result Date: 01/27/2019 CLINICAL DATA:  Abnormal LFTs. EXAM: ULTRASOUND ABDOMEN LIMITED RIGHT UPPER QUADRANT COMPARISON:  Abdomen 07/30/2018. FINDINGS: Gallbladder: Gallbladder sludge noted. Gallbladder wall thickness up to 7.5 mm. Prominent soft tissue edema noted about the gallbladder. Mild pericholecystic fluid collection cannot be excluded. Findings suggest the possibility of cholecystitis. Common bile duct: Diameter: 5.1 mm Liver: Increase hepatic echogenicity consistent fatty infiltration or hepatocellular disease. Liver appears prominent. Hepatomegaly cannot be excluded. Portal vein is patent on color Doppler imaging with normal direction  of blood flow towards the liver. Other: Mild ascites cannot be excluded. IMPRESSION: 1. Gallbladder sludge noted. Gallbladder wall thickness of 7.5 mm. Prominent soft tissue edema noted about the gallbladder. Mild pericholecystic fluid collection cannot be excluded. Findings suggest gallbladder sludge with cholecystitis. 2. Increased hepatic echogenicity consistent fatty infiltration or hepatocellular disease. Hepatomegaly cannot be excluded. 3.  Mild ascites cannot be excluded. Electronically Signed   By: Marcello Moores  Register   On: 01/27/2019 10:15    Micro Results     Recent Results (from the past 240 hour(s))  SARS CORONAVIRUS 2 (TAT 6-24 HRS) Nasopharyngeal Nasopharyngeal Swab     Status: None   Collection Time: 01/26/19  5:08 PM   Specimen: Nasopharyngeal Swab  Result Value Ref Range Status   SARS Coronavirus 2 NEGATIVE NEGATIVE Final    Comment: (NOTE) SARS-CoV-2 target nucleic acids are NOT DETECTED. The SARS-CoV-2 RNA is generally detectable in upper and lower respiratory specimens during the acute phase of infection. Negative results do not preclude SARS-CoV-2 infection, do not  rule out co-infections with other pathogens, and should not be used as the sole basis for treatment or other patient management decisions. Negative results must be combined with clinical observations, patient history, and epidemiological information. The expected result is Negative. Fact Sheet for Patients: SugarRoll.be Fact Sheet for Healthcare Providers: https://www.woods-mathews.com/ This test is not yet approved or cleared by the [place] FDA and  has been authorized for detection and/or diagnosis of SARS-CoV-2 by FDA under an Emergency Use Authorization (EUA). This EUA will remain  in effect (meaning this test can be used) for the duration of the COVID-19 declaration under Section 56 4(b)(1) of the Act, 21 U.S.C. section 360bbb-3(b)(1), unless the authorization is terminated or revoked sooner. Performed at [facility], [place] [address], [place], [place] [address]        Today   Subjective:   Michaeldavid Sylte today denies any more abdominal pain, able to tolerate oral intake, denies fever, chills, nausea, vomiting or diarrhea.  Objective:   Blood pressure 120/61, pulse 80, temperature 98.9 F (37.2 C), temperature source Oral, resp. rate 18, height 5\' 7"  (1.702 m), weight 54 kg, SpO2 98 %.   Intake/Output Summary (Last 24 hours) at 02/02/2019 1039 Last data filed at 02/02/2019 0932 Gross per 24 hour  Intake 390 ml  Output 2 ml  Net 388 ml    Exam Awake Alert, Oriented x 3, No new F.N deficits, Normal affect, thin appearing Symmetrical Chest wall movement, Good air movement bilaterally, CTAB RRR,No Gallops,Rubs or new Murmurs, No Parasternal Heave +ve B.Sounds, Abd Soft, Non tender,  No rebound -guarding or rigidity. No Cyanosis, Clubbing or edema, No new Rash or bruise  Data Review   CBC w Diff:  Lab Results  Component Value Date   WBC 8.9 02/01/2019   HGB 8.6 (L) 02/01/2019   HCT 26.1 (L) 02/01/2019   HCT  21.0 (L) 01/27/2019   PLT 93 (L) 02/01/2019   LYMPHOPCT 17 01/31/2019   MONOPCT 8 01/31/2019   EOSPCT 2 01/31/2019   BASOPCT 1 01/31/2019    CMP:  Lab Results  Component Value Date   NA 132 (L) 02/01/2019   K 4.3 02/01/2019   CL 100 02/01/2019   CO2 24 02/01/2019   BUN 7 02/01/2019   CREATININE 0.65 02/01/2019   PROT 5.4 (L) 02/01/2019   ALBUMIN 2.6 (L) 02/01/2019   BILITOT 0.8 02/01/2019   ALKPHOS 92 02/01/2019   AST 87 (H) 02/01/2019  ALT 33 02/01/2019  .   Phillips Climes M.D on 02/02/2019 at 10:39 AM  [facility]   Office  [phone]

## 2019-02-02 NOTE — Progress Notes (Signed)
Patient given discharge, follow up, and medication instructions, verbalized understanding however will continue to need reinforcement for compliance,no IV or telemetry box in use, personal belongings with patient,NT to take patient via [place] to bus stop

## 2019-02-02 NOTE — Progress Notes (Signed)
Occupational Therapy Treatment Patient Details Name: Trevor Williams MRN: [id number] DOB: 01-Aug-1960 Today's Date: 02/02/2019    History of present illness Pt is a 58 yo male with medical history significant of alcohol abuse, COPD, tobacco abuse, and lung cancer who presents for multiple falls with generalized weakness.  He was found to be hypotensive, hypokalemic, and hypomagnesemic at admission.  EGD on 01/28/19 was negative for active bleed. Colonscopy on 11/12 was negative for acute bleed.  Pt to have HIDA scan today.   OT comments  Pt functioning independently in ADL. Discussed pt monitoring himself for fatigue while walking back to his campsite and taking rest breaks as needed. Pt to discharge today.  Follow Up Recommendations  No OT follow up    Equipment Recommendations  None recommended by OT    Recommendations for Other Services      Precautions / Restrictions Precautions Precautions: None       Mobility Bed Mobility Overal bed mobility: Independent                Transfers Overall transfer level: Independent                    Balance Overall balance assessment: Independent                                         ADL either performed or assessed with clinical judgement   ADL Overall ADL's : Modified independent                                       General ADL Comments: Pt demonstrating ability to gather and don clothing in preparation for d/c. Pt with concerns about being able to walk a mile to his campsite. Educated pt to monitor his fatigue and stop and take seated or standing rest breaks frequently as he will also be carrying his belongings. Offered pt rollator, but he states he could not use it in the woods.     Vision       Perception     Praxis      Cognition Arousal/Alertness: Awake/alert Behavior During Therapy: WFL for tasks assessed/performed Overall Cognitive Status: Within Functional Limits  for tasks assessed                                          Exercises     Shoulder Instructions       General Comments      Pertinent Vitals/ Pain       Pain Assessment: No/denies pain  Home Living                                          Prior Functioning/Environment              Frequency           Progress Toward Goals  OT Goals(current goals can now be found in the care plan section)  Progress towards OT goals: Goals met/education completed, patient discharged from OT  Acute Rehab OT Goals Patient Stated Goal: return to his campsite today  Plan  All goals met and education completed, patient discharged from OT services    Co-evaluation                 AM-PAC OT "6 Clicks" Daily Activity     Outcome Measure   Help from another person eating meals?: None Help from another person taking care of personal grooming?: None Help from another person toileting, which includes using toliet, bedpan, or urinal?: None Help from another person bathing (including washing, rinsing, drying)?: None Help from another person to put on and taking off regular upper body clothing?: None Help from another person to put on and taking off regular lower body clothing?: None 6 Click Score: 24    End of Session    OT Visit Diagnosis: Muscle weakness (generalized) (M62.81)   Activity Tolerance Patient tolerated treatment well   Patient Left in chair;with call bell/phone within reach   Nurse Communication          Time: 2548-6282 OT Time Calculation (min): 17 min  Charges: OT General Charges $OT Visit: 1 Visit OT Treatments $Self Care/Home Management : 8-22 mins  Nestor Lewandowsky, OTR/L Acute Rehabilitation Services Pager: [phone] Office: [phone]   Malka So 02/02/2019, 9:30 AM

## 2019-02-19 ENCOUNTER — Other Ambulatory Visit: Payer: Self-pay | Admitting: Critical Care Medicine

## 2019-02-19 ENCOUNTER — Encounter: Payer: Self-pay | Admitting: Critical Care Medicine

## 2019-02-19 MED ORDER — TRIAMCINOLONE ACETONIDE 0.1 % EX CREA: 1.0000 "application " | TOPICAL_CREAM | Freq: Two times a day (BID) | CUTANEOUS | 0 refills | Status: DC

## 2019-02-19 MED ORDER — FOLIC ACID 1 MG PO TABS: 1.0000 mg | ORAL_TABLET | Freq: Every day | ORAL | 0 refills | Status: DC

## 2019-02-19 MED ORDER — CARVEDILOL 3.125 MG PO TABS: 3.1250 mg | ORAL_TABLET | Freq: Two times a day (BID) | ORAL | 0 refills | Status: DC

## 2019-02-19 MED ORDER — ROSUVASTATIN CALCIUM 10 MG PO TABS: 10.0000 mg | ORAL_TABLET | Freq: Every day | ORAL | 0 refills | Status: DC

## 2019-02-19 MED ORDER — PANTOPRAZOLE SODIUM 40 MG PO TBEC: 40.0000 mg | DELAYED_RELEASE_TABLET | Freq: Every day | ORAL | 0 refills | Status: DC

## 2019-02-19 MED ORDER — SACUBITRIL-VALSARTAN 24-26 MG PO TABS: 1.0000 | ORAL_TABLET | Freq: Two times a day (BID) | ORAL | 0 refills | Status: DC

## 2019-02-19 MED ORDER — ALBUTEROL SULFATE HFA 108 (90 BASE) MCG/ACT IN AERS: 2.0000 | INHALATION_SPRAY | RESPIRATORY_TRACT | 0 refills | Status: DC | PRN

## 2019-02-19 MED ORDER — THIAMINE HCL 100 MG PO TABS: 100.0000 mg | ORAL_TABLET | Freq: Every day | ORAL | 0 refills | Status: DC

## 2019-02-19 MED ORDER — AZITHROMYCIN 250 MG PO TABS: 250.0000 mg | ORAL_TABLET | Freq: Every day | ORAL | 0 refills | Status: DC

## 2019-02-19 NOTE — Progress Notes (Signed)
Med refills

## 2019-02-20 ENCOUNTER — Encounter (HOSPITAL_COMMUNITY): Payer: Self-pay

## 2019-02-20 ENCOUNTER — Other Ambulatory Visit: Payer: Self-pay

## 2019-02-20 ENCOUNTER — Emergency Department (HOSPITAL_COMMUNITY): Payer: Medicaid Other

## 2019-02-20 ENCOUNTER — Other Ambulatory Visit: Payer: Self-pay | Admitting: Critical Care Medicine

## 2019-02-20 ENCOUNTER — Emergency Department (HOSPITAL_COMMUNITY)
Admission: EM | Admit: 2019-02-20 | Discharge: 2019-02-21 | Disposition: A | Discharge status: Home / Self Care | Payer: Medicaid Other | Attending: Emergency Medicine | Admitting: Emergency Medicine

## 2019-02-20 DIAGNOSIS — R519 Headache, unspecified: Secondary | ICD-10-CM | POA: Diagnosis not present

## 2019-02-20 DIAGNOSIS — Y92410 Unspecified street and highway as the place of occurrence of the external cause: Secondary | ICD-10-CM | POA: Diagnosis not present

## 2019-02-20 DIAGNOSIS — S79911A Unspecified injury of right hip, initial encounter: Secondary | ICD-10-CM | POA: Diagnosis not present

## 2019-02-20 DIAGNOSIS — Z79899 Other long term (current) drug therapy: Secondary | ICD-10-CM | POA: Insufficient documentation

## 2019-02-20 DIAGNOSIS — S0181XA Laceration without foreign body of other part of head, initial encounter: Secondary | ICD-10-CM | POA: Insufficient documentation

## 2019-02-20 DIAGNOSIS — Z85828 Personal history of other malignant neoplasm of skin: Secondary | ICD-10-CM | POA: Diagnosis not present

## 2019-02-20 DIAGNOSIS — Y9301 Activity, walking, marching and hiking: Secondary | ICD-10-CM | POA: Diagnosis not present

## 2019-02-20 DIAGNOSIS — F17210 Nicotine dependence, cigarettes, uncomplicated: Secondary | ICD-10-CM | POA: Diagnosis not present

## 2019-02-20 DIAGNOSIS — S79912A Unspecified injury of left hip, initial encounter: Secondary | ICD-10-CM | POA: Diagnosis not present

## 2019-02-20 DIAGNOSIS — Z590 Homelessness: Secondary | ICD-10-CM | POA: Diagnosis not present

## 2019-02-20 DIAGNOSIS — S3992XA Unspecified injury of lower back, initial encounter: Secondary | ICD-10-CM | POA: Diagnosis not present

## 2019-02-20 DIAGNOSIS — R52 Pain, unspecified: Secondary | ICD-10-CM | POA: Diagnosis not present

## 2019-02-20 DIAGNOSIS — S8992XA Unspecified injury of left lower leg, initial encounter: Secondary | ICD-10-CM | POA: Diagnosis not present

## 2019-02-20 DIAGNOSIS — S199XXA Unspecified injury of neck, initial encounter: Secondary | ICD-10-CM | POA: Diagnosis not present

## 2019-02-20 DIAGNOSIS — Y999 Unspecified external cause status: Secondary | ICD-10-CM | POA: Insufficient documentation

## 2019-02-20 DIAGNOSIS — S299XXA Unspecified injury of thorax, initial encounter: Secondary | ICD-10-CM | POA: Diagnosis not present

## 2019-02-20 DIAGNOSIS — M25562 Pain in left knee: Secondary | ICD-10-CM | POA: Insufficient documentation

## 2019-02-20 MED ORDER — SACUBITRIL-VALSARTAN 24-26 MG PO TABS: 1.0000 | ORAL_TABLET | Freq: Two times a day (BID) | ORAL | 0 refills | Status: DC

## 2019-02-20 MED ORDER — LIDOCAINE 5 % EX PTCH: 1.0000 | MEDICATED_PATCH | CUTANEOUS | 0 refills | Status: DC

## 2019-02-20 MED ORDER — METHOCARBAMOL 500 MG PO TABS: 500.0000 mg | ORAL_TABLET | Freq: Two times a day (BID) | ORAL | 0 refills | Status: DC
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
## 2019-02-20 NOTE — Progress Notes (Signed)
Patient ID: Trevor Williams, male   DOB: 1960/11/03, 58 y.o.   MRN: [id number] This is a 58 year old male here in the [place] clinic and just arrived here 3 weeks ago.  He comes in today complaining of a rash on his face which.  He also states he has been having low-grade fevers.  He has been coughing and a bit more short of breath.  He has underlying COPD.  He does smoke half pack a day of cigarettes.  He has been homeless for the past 2 years he is originally from [place].  The patient recently was hospitalized for heart failure and hypertensive induced changes.  He previously was seen at [facility] clinic however now he has Medicaid and can no longer go to that clinic.  Below is the discharge summary from the recent hospitalization  Trevor Williams, is a 58 y.o. male  DOB [birth year]  MRN [id number].  Admission date:  01/26/2019  Admitting Physician  [name], MD  Discharge Date:  02/01/2019   Primary MD  Placey, Audrea Muscat, NP  Recommendations for primary care physician for things to follow:  -Please check CBC, BMP during next visit. -Please continue counseling for alcohol abuse   Admission Diagnosis  Hypokalemia [E87.6] Hypomagnesemia [E83.42] Alcohol abuse [F10.10] Homeless single person [Z59.0] Fall, initial encounter [W19.XXXA] Anemia, unspecified type [D64.9] GI bleed [K92.2]   Discharge Diagnosis  Hypokalemia [E87.6] Hypomagnesemia [E83.42] Alcohol abuse [F10.10] Homeless single person [Z59.0] Fall, initial encounter [W19.XXXA] Anemia, unspecified type [D64.9] GI bleed [K92.2]    Active Problems:   GI bleed   Alcohol use   Homeless   Hypokalemia   Hypotensive episode   Fall at home, initial encounter          Past Medical History:  Diagnosis Date  . Abnormal liver function tests   . Acute respiratory failure ([place])    a. 07/2018 requiring intubation - CAP/CHF.  Marland Kitchen Alcohol abuse   . Biventricular heart failure ([place])   . Cancer ([place])     skin (left hand)  . Delirium    a. h/o delirium while admitted  . Emphysema of lung ([place])   . Homelessness   . Macrocytic anemia   . Mild CAD    a. cath 07/2018 60% ostial ramus otherwise OK.  . Mitral regurgitation   . NICM (nonischemic cardiomyopathy) ([place])   . Protein calorie malnutrition ([place])   . Tricuspid regurgitation          Past Surgical History:  Procedure Laterality Date  . BIOPSY  01/28/2019   Procedure: BIOPSY;  Surgeon: Otis Brace, MD;  Location: [facility];  Service: Gastroenterology;;  . BIOPSY  01/30/2019   Procedure: BIOPSY;  Surgeon: Otis Brace, MD;  Location: [facility];  Service: Gastroenterology;;  . COLONOSCOPY WITH PROPOFOL N/A 01/30/2019   Procedure: COLONOSCOPY WITH PROPOFOL;  Surgeon: Otis Brace, MD;  Location: [facility];  Service: Gastroenterology;  Laterality: N/A;  . ESOPHAGOGASTRODUODENOSCOPY (EGD) WITH PROPOFOL N/A 01/28/2019   Procedure: ESOPHAGOGASTRODUODENOSCOPY (EGD) WITH PROPOFOL;  Surgeon: Otis Brace, MD;  Location: [facility];  Service: Gastroenterology;  Laterality: N/A;  . HEMOSTASIS CLIP PLACEMENT  01/30/2019   Procedure: HEMOSTASIS CLIP PLACEMENT;  Surgeon: Otis Brace, MD;  Location: [facility];  Service: Gastroenterology;;  . POLYPECTOMY  01/30/2019   Procedure: POLYPECTOMY;  Surgeon: Otis Brace, MD;  Location: [facility];  Service: Gastroenterology;;  . RIGHT/LEFT HEART CATH AND CORONARY ANGIOGRAPHY N/A 08/09/2018   Procedure: RIGHT/LEFT HEART CATH AND CORONARY ANGIOGRAPHY;  Surgeon: Larey Dresser, MD;  Location: [facility];  Service: Cardiovascular;  Laterality: N/A;       History of present illness and  Hospital Course:     Kindly see H&P for history of present illness and admission details, please review complete Labs, Consult reports and Test reports for all details in brief  HPI  from the history and physical done on the day of  admission 01/26/2019  [id number] [name] a 58 y.o.malewith medical history significant ofalcohol abuse, COPD, tobacco abuse, and lung cancer who presents for multiple falls with generalized weakness. He states that over the last 2 to 3 weeks he has noticed increasing weakness as well as lightheadedness and dizziness. He describes the weakness as severe. He is unable to identify any exacerbating or remitting factors. He admits to associated dark and tarry stools. He also admits to associated shortness of breath however denies any chest pain. He denies any syncopal events despite the multiple falls. He denies any injuries from the falls. He reports no similar events in the past. Of note, the patient is homeless. He denies any fever, chills, nausea, vomiting, abdominal pain, dysuria.  ED Course:In the ED the patient was found to be mildly hypotensive however responded to IV fluid. He was also found to be severely hypokalemic and hypomagnesemic and received replacement for both of these. GI was called and consulted with no acute intervention required overnight.  Hospital Course   Rectal bleed:  -Colonoscopy revealed sigmoid and cecal polyps that were removed. Hemorrhoids were noted as well.No active source of GI bleed -EGD revealed duodenitis and gastritis.  He will be discharged on Protonix biopsies are benign, no H. pylori -No further bleeding. -Hemoglobin has been stable.   Alcohol Abuse with concerns for Withdrawal: -No evidence of withdrawal during hospital stay  Homeless: -Social work team help greatly appreciated,   Abnormal LFT: -Most likely in the setting of alcohol abuse. -Patient with complains of right upper quadrant pain, HIDA scan was done, no evidence of cholecystitis, this is most likely due to alcohol abuse, LFTs trending down.  Has no abdominal pain today -Acute hepatitis panel came back negative.  Nonischemic cardiomyopathy with history of  biventricular heart failure of mild CAD History of mitral regurgitation and history of tricuspid regurgitation -Stable. No symptoms. -Has a history an EF of 15 to 20% -Currently holding his cardiac medications including carvedilol 3.125 mg p.o. twice daily, Entresto 24-26 1 tab p.o. twice daily -Continue rosuvastatin 10 mg p.o. nightly -Iwas given prescriptions, he fills his medications and his follow-up with [facility] she is instructed to follow  Hypotension but has a Hx of Hypertension -resolved  Hypokalemia -Resolved.  Hypophosphatemia -Resolved.   Hypomagnesemia -repleted  History of emphysema of the lungs in the setting of tobacco abuse -Stable  Hyponatremia -stable  Thrombocytopenia -Secondary to liver disease from alcohol  Note the patient's not had any lab obtained since discharge.  He states he still gets weak at times.  On exam temperature is 101.4 blood pressure 118/78 saturation 98% pulse 91. Below is the patient's current active med list with the exception that the patient was yet to be on antibiotic and the triamcinolone Current Outpatient Medications on File Prior to Visit  Medication Sig Dispense Refill  . albuterol (VENTOLIN HFA) 108 (90 Base) MCG/ACT inhaler Inhale 2 puffs into the lungs every 4 (four) hours as needed for wheezing or shortness of breath. 18 g 0  . azithromycin (ZITHROMAX) 250 MG tablet Take 1  tablet (250 mg total) by mouth daily. Take two once then one daily until gone 6 tablet 0  . carvedilol (COREG) 3.125 MG tablet Take 1 tablet (3.125 mg total) by mouth 2 (two) times daily with a meal. 60 tablet 0  . feeding supplement, ENSURE ENLIVE, (ENSURE ENLIVE) LIQD Take 237 mLs by mouth 3 (three) times daily between meals.    . folic acid (FOLVITE) 1 MG tablet Take 1 tablet (1 mg total) by mouth daily. 60 tablet 0  . pantoprazole (PROTONIX) 40 MG tablet Take 1 tablet (40 mg total) by mouth daily. 30 tablet 0  . rosuvastatin (CRESTOR) 10 MG  tablet Take 1 tablet (10 mg total) by mouth daily at 6 PM. 30 tablet 0  . sacubitril-valsartan (ENTRESTO) 24-26 MG Take 1 tablet by mouth 2 (two) times daily. 60 tablet 0  . thiamine 100 MG tablet Take 1 tablet (100 mg total) by mouth daily. 30 tablet 0  . triamcinolone cream (KENALOG) 0.1 % Apply 1 application topically 2 (two) times daily. To face lesions 30 g 0   No current facility-administered medications on file prior to visit.     Chest exam showed distant breath sounds no wheezes cardiac exam showed regular rate rhythm normal S1-S2 abdomen soft nontender  Skin showed a psoriasis type lesion over the left cheek and right temple areas  Impression is that of recent hypotensive episode with simple acute bronchitis currently existing alcohol use homelessness hypokalemia tobacco use previous GI bleeding.  Plan will be for the patient to have all of his medicines refilled note we were not able to provide the [facility] due to his expense.  We will get this patient into our patient assistance program for the [facility] and get the patient to the community health and wellness clinic

## 2019-02-20 NOTE — Discharge Instructions (Addendum)
You have been seen today for multiple injuries, but specifically a knee injury. There were no acute abnormalities on the x-rays, including no sign of fracture or dislocation, however, there could be injuries to the soft tissues, such as the ligaments or tendons that are not seen on xrays. There could also be what are called occult fractures that are small fractures not seen on xray. If you are able, try the following regimen: Antiinflammatory medications: Take 600 mg of ibuprofen every 6 hours or 440 mg (over the counter dose) to 500 mg (prescription dose) of naproxen every 12 hours for the next 3 days. After this time, these medications may be used as needed for pain. Take these medications with food to avoid upset stomach. Choose only one of these medications, do not take them together. Acetaminophen (generic for Tylenol): Should you continue to have additional pain while taking the ibuprofen or naproxen, you may add in acetaminophen as needed. Your daily total maximum amount of acetaminophen from all sources should be limited to 4000mg /day for persons without liver problems, or 2000mg /day for those with liver problems. Methocarbamol: Methocarbamol (generic for Robaxin) is a muscle relaxer and can help relieve stiff muscles or muscle spasms.  Do not drive or perform other dangerous activities while taking this medication as it can cause drowsiness as well as changes in reaction time and judgement. Ice: May apply ice to the area over the next 24 hours for 15 minutes at a time to reduce swelling. Elevation: Keep the extremity elevated as often as possible to reduce pain and inflammation. Support: Wear the knee immobilizer for support and comfort. Wear this until pain resolves. You will be weight-bearing as tolerated, which means you can slowly start to put weight on the extremity and increase amount and frequency as pain allows. Exercises: Start by performing these exercises a few times a week, increasing  the frequency until you are performing them twice daily.  Follow up: If symptoms are improving, you may follow up with your primary care provider for any continued management. If symptoms are not starting to improve within a week, you should follow up with the orthopedic specialist within two weeks. Return: Return to the ED for numbness, weakness, increasing pain, overall worsening symptoms, loss of function, or if symptoms are not improving, you have tried to follow up with the orthopedic specialist, and have been unable to do so.  For prescription assistance, may try using prescription discount sites or apps, such as goodrx.com
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
## 2019-02-21 MED FILL — METHOCARBAMOL 500 MG TABS: 500 | 10 days supply | Qty: 20 | Fill #0

## 2019-02-21 NOTE — ED Notes (Signed)
Reviewed discharge paperwork with pt, pt verbalized understanding. Per PA, patient will remain in the room to sleep until the morning, approved by Agricultural consultant.

## 2019-02-21 NOTE — ED Notes (Signed)
Pt ambulatory to and from bathroom.  

## 2019-02-26 MED FILL — ENTRESTO 24 MG-26 MG TABLET: 24-26 | 30 days supply | Qty: 60 | Fill #0
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
## 2019-03-03 ENCOUNTER — Ambulatory Visit: Payer: Medicaid Other | Attending: Critical Care Medicine | Admitting: Critical Care Medicine

## 2019-03-03 ENCOUNTER — Encounter: Payer: Self-pay | Admitting: Critical Care Medicine

## 2019-03-03 ENCOUNTER — Other Ambulatory Visit: Payer: Self-pay

## 2019-03-03 VITALS — BP 127/78 | HR 113 | Temp 98.0°F | Resp 18 | Ht 67.0 in | Wt 130.0 lb

## 2019-03-03 DIAGNOSIS — G8929 Other chronic pain: Secondary | ICD-10-CM

## 2019-03-03 DIAGNOSIS — I5022 Chronic systolic (congestive) heart failure: Secondary | ICD-10-CM | POA: Diagnosis not present

## 2019-03-03 DIAGNOSIS — L988 Other specified disorders of the skin and subcutaneous tissue: Secondary | ICD-10-CM

## 2019-03-03 DIAGNOSIS — R7989 Other specified abnormal findings of blood chemistry: Secondary | ICD-10-CM

## 2019-03-03 DIAGNOSIS — Z23 Encounter for immunization: Secondary | ICD-10-CM | POA: Diagnosis not present

## 2019-03-03 DIAGNOSIS — K2981 Duodenitis with bleeding: Secondary | ICD-10-CM

## 2019-03-03 DIAGNOSIS — E876 Hypokalemia: Secondary | ICD-10-CM | POA: Diagnosis not present

## 2019-03-03 DIAGNOSIS — Z7289 Other problems related to lifestyle: Secondary | ICD-10-CM | POA: Diagnosis not present

## 2019-03-03 DIAGNOSIS — I5082 Biventricular heart failure: Secondary | ICD-10-CM | POA: Diagnosis not present

## 2019-03-03 DIAGNOSIS — Z789 Other specified health status: Secondary | ICD-10-CM

## 2019-03-03 DIAGNOSIS — D539 Nutritional anemia, unspecified: Secondary | ICD-10-CM | POA: Insufficient documentation

## 2019-03-03 DIAGNOSIS — Z79899 Other long term (current) drug therapy: Secondary | ICD-10-CM | POA: Insufficient documentation

## 2019-03-03 DIAGNOSIS — J410 Simple chronic bronchitis: Secondary | ICD-10-CM

## 2019-03-03 DIAGNOSIS — E46 Unspecified protein-calorie malnutrition: Secondary | ICD-10-CM | POA: Insufficient documentation

## 2019-03-03 DIAGNOSIS — L989 Disorder of the skin and subcutaneous tissue, unspecified: Secondary | ICD-10-CM | POA: Diagnosis not present

## 2019-03-03 DIAGNOSIS — F17210 Nicotine dependence, cigarettes, uncomplicated: Secondary | ICD-10-CM | POA: Diagnosis not present

## 2019-03-03 DIAGNOSIS — I081 Rheumatic disorders of both mitral and tricuspid valves: Secondary | ICD-10-CM | POA: Diagnosis not present

## 2019-03-03 DIAGNOSIS — I428 Other cardiomyopathies: Secondary | ICD-10-CM | POA: Diagnosis not present

## 2019-03-03 DIAGNOSIS — Z886 Allergy status to analgesic agent status: Secondary | ICD-10-CM | POA: Diagnosis not present

## 2019-03-03 DIAGNOSIS — M25562 Pain in left knee: Secondary | ICD-10-CM | POA: Diagnosis not present

## 2019-03-03 DIAGNOSIS — F10988 Alcohol use, unspecified with other alcohol-induced disorder: Secondary | ICD-10-CM | POA: Diagnosis not present

## 2019-03-03 DIAGNOSIS — Z85828 Personal history of other malignant neoplasm of skin: Secondary | ICD-10-CM | POA: Insufficient documentation

## 2019-03-03 DIAGNOSIS — Z8249 Family history of ischemic heart disease and other diseases of the circulatory system: Secondary | ICD-10-CM | POA: Diagnosis not present

## 2019-03-03 DIAGNOSIS — I110 Hypertensive heart disease with heart failure: Secondary | ICD-10-CM | POA: Insufficient documentation

## 2019-03-03 DIAGNOSIS — Z881 Allergy status to other antibiotic agents status: Secondary | ICD-10-CM | POA: Diagnosis not present

## 2019-03-03 DIAGNOSIS — I2510 Atherosclerotic heart disease of native coronary artery without angina pectoris: Secondary | ICD-10-CM | POA: Diagnosis not present

## 2019-03-03 DIAGNOSIS — F17200 Nicotine dependence, unspecified, uncomplicated: Secondary | ICD-10-CM

## 2019-03-03 DIAGNOSIS — M5440 Lumbago with sciatica, unspecified side: Secondary | ICD-10-CM

## 2019-03-03 DIAGNOSIS — J449 Chronic obstructive pulmonary disease, unspecified: Secondary | ICD-10-CM | POA: Insufficient documentation

## 2019-03-03 DIAGNOSIS — Z5900 Homelessness unspecified: Secondary | ICD-10-CM

## 2019-03-03 DIAGNOSIS — Z6820 Body mass index (BMI) 20.0-20.9, adult: Secondary | ICD-10-CM | POA: Insufficient documentation

## 2019-03-03 DIAGNOSIS — D500 Iron deficiency anemia secondary to blood loss (chronic): Secondary | ICD-10-CM | POA: Diagnosis not present

## 2019-03-03 MED ORDER — GABAPENTIN 300 MG PO CAPS: 300.0000 mg | ORAL_CAPSULE | Freq: Three times a day (TID) | ORAL | 3 refills | Status: DC

## 2019-03-03 MED ORDER — SACUBITRIL-VALSARTAN 24-26 MG PO TABS: 1.0000 | ORAL_TABLET | Freq: Two times a day (BID) | ORAL | 0 refills | Status: DC

## 2019-03-03 MED ORDER — CARVEDILOL 3.125 MG PO TABS: 3.1250 mg | ORAL_TABLET | Freq: Two times a day (BID) | ORAL | 2 refills | Status: DC

## 2019-03-03 MED ORDER — THIAMINE HCL 100 MG PO TABS: 100.0000 mg | ORAL_TABLET | Freq: Every day | ORAL | 3 refills | Status: DC

## 2019-03-03 MED ORDER — ROSUVASTATIN CALCIUM 10 MG PO TABS: 10.0000 mg | ORAL_TABLET | Freq: Every day | ORAL | 3 refills | Status: DC

## 2019-03-03 MED ORDER — FOLIC ACID 1 MG PO TABS: 1.0000 mg | ORAL_TABLET | Freq: Every day | ORAL | 0 refills | Status: DC

## 2019-03-03 MED ORDER — MELOXICAM 15 MG PO TABS: 15.0000 mg | ORAL_TABLET | Freq: Every day | ORAL | 0 refills | Status: DC | PRN

## 2019-03-03 MED ORDER — PANTOPRAZOLE SODIUM 40 MG PO TBEC: 40.0000 mg | DELAYED_RELEASE_TABLET | Freq: Every day | ORAL | 1 refills | Status: DC

## 2019-03-03 MED FILL — FOLIC ACID 1 MG TABS: 1 | 60 days supply | Qty: 60 | Fill #0

## 2019-03-03 MED FILL — MELOXICAM 15 MG TABLET: 15 | 20 days supply | Qty: 20 | Fill #0

## 2019-03-03 MED FILL — GABAPENTIN 300 MG CAPSULE: 300 | 30 days supply | Qty: 90 | Fill #0

## 2019-03-03 MED FILL — CARVEDILOL 3.125 MG TABLET: 3.125 | 90 days supply | Qty: 180 | Fill #0

## 2019-03-03 MED FILL — PANTOPRAZOLE SOD DR 40 MG T: 40 | 30 days supply | Qty: 30 | Fill #0

## 2019-03-03 MED FILL — ROSUVASTATIN CALCIUM 10 MG: 10 | 30 days supply | Qty: 30 | Fill #0

## 2019-03-03 NOTE — Assessment & Plan Note (Signed)
I briefly counseled the patient on his tobacco use  Plan will be to continue tobacco use counseling at the next visit

## 2019-03-03 NOTE — Assessment & Plan Note (Signed)
Gastroesophageal bleeding now under control likely source from upper GI source

## 2019-03-03 NOTE — Assessment & Plan Note (Signed)
We will follow-up liver function profile and continue folic acid and thiamine

## 2019-03-03 NOTE — Assessment & Plan Note (Signed)
Hypokalemia at last hospitalization  Plan will be to recheck potassium level today

## 2019-03-03 NOTE — Assessment & Plan Note (Addendum)
History of nonischemic cardiomyopathy likely alcohol induced with minimal coronary disease  Plan would be to obtain [facility] for this patient through patient assistance program at a 24-26 mg dosing twice daily  Patient will continue Coreg 3.125 mg twice daily  No diuretics indicated at this time
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
## 2019-03-03 NOTE — Assessment & Plan Note (Signed)
COPD with simple chronic bronchitis and emphysema seen on CT scan of chest  Plan will be to continue albuterol as needed and he will finish his current course of azithromycin and will need to pursue smoking cessation however I am focusing on the patient's alcohol consumption first

## 2019-03-03 NOTE — Assessment & Plan Note (Signed)
Low hemoglobin with low iron levels at previous hospital encounter  We will recheck iron levels and CBC at this visit

## 2019-03-03 NOTE — Patient Instructions (Signed)
Labs today will include metabolic panel, blood count, iron studies, magnesium level  Try meloxicam 1 daily for knee pain as needed and also gabapentin 3 times daily for back pain  Refills on all of your other medicines were sent to our pharmacy  Begin Entresto 1 twice daily and this was sent to our pharmacy  Focus on alcohol cessation  Finish your current amount of antibiotics  Referral to orthopedics for your left knee will be made  Flu vaccine was given this visit  Return to see Dr. Joya Gaskins 2 months and I will follow you up as well with the [place] shelter clinic on Wednesdays as needed

## 2019-03-03 NOTE — Assessment & Plan Note (Signed)
Skin lesion on the face likely not malignant in nature  Continue observation

## 2019-03-03 NOTE — Assessment & Plan Note (Signed)
Ongoing alcohol use now down to 1 beer daily   we spent 10 minutes of this visit going over alcohol use counseling

## 2019-03-03 NOTE — Assessment & Plan Note (Signed)
We will plan to recheck magnesium levels today

## 2019-03-04 LAB — COMPREHENSIVE METABOLIC PANEL
ALT: 20 IU/L (ref 0–44)
AST: 43 IU/L — ABNORMAL HIGH (ref 0–40)
Albumin/Globulin Ratio: 1.3 (ref 1.2–2.2)
Albumin: 4 g/dL (ref 3.8–4.9)
Alkaline Phosphatase: 90 IU/L (ref 39–117)
BUN/Creatinine Ratio: 19 (ref 9–20)
BUN: 12 mg/dL (ref 6–24)
Bilirubin Total: 0.6 mg/dL (ref 0.0–1.2)
CO2: 21 mmol/L (ref 20–29)
Calcium: 9.3 mg/dL (ref 8.7–10.2)
Chloride: 104 mmol/L (ref 96–106)
Creatinine, Ser: 0.62 mg/dL — ABNORMAL LOW (ref 0.76–1.27)
GFR calc Af Amer: 127 mL/min/{1.73_m2} (ref >59)
GFR calc non Af Amer: 110 mL/min/{1.73_m2} (ref >59)
Globulin, Total: 3.1 g/dL (ref 1.5–4.5)
Glucose: 83 mg/dL (ref 65–99)
Potassium: 3.9 mmol/L (ref 3.5–5.2)
Sodium: 140 mmol/L (ref 134–144)
Total Protein: 7.1 g/dL (ref 6.0–8.5)

## 2019-03-04 LAB — CBC WITH DIFFERENTIAL/PLATELET
Basophils Absolute: 0.1 10*3/uL (ref 0.0–0.2)
Basos: 1 %
EOS (ABSOLUTE): 0.2 10*3/uL (ref 0.0–0.4)
Eos: 3 %
Hematocrit: 33 % — ABNORMAL LOW (ref 37.5–51.0)
Hemoglobin: 11 g/dL — ABNORMAL LOW (ref 13.0–17.7)
Immature Grans (Abs): 0 10*3/uL (ref 0.0–0.1)
Immature Granulocytes: 0 %
Lymphocytes Absolute: 2.1 10*3/uL (ref 0.7–3.1)
Lymphs: 29 %
MCH: 35.1 pg — ABNORMAL HIGH (ref 26.6–33.0)
MCHC: 33.3 g/dL (ref 31.5–35.7)
MCV: 105 fL — ABNORMAL HIGH (ref 79–97)
Monocytes Absolute: 0.8 10*3/uL (ref 0.1–0.9)
Monocytes: 11 %
Neutrophils Absolute: 3.9 10*3/uL (ref 1.4–7.0)
Neutrophils: 56 %
Platelets: 127 10*3/uL — ABNORMAL LOW (ref 150–450)
RBC: 3.13 x10E6/uL — ABNORMAL LOW (ref 4.14–5.80)
RDW: 12.9 % (ref 11.6–15.4)
WBC: 7 10*3/uL (ref 3.4–10.8)

## 2019-03-04 LAB — MAGNESIUM: Magnesium: 1.7 mg/dL (ref 1.6–2.3)

## 2019-03-04 LAB — IRON: Iron: 130 ug/dL (ref 38–169)

## 2019-03-05 ENCOUNTER — Encounter: Payer: Self-pay | Admitting: Critical Care Medicine

## 2019-03-11 ENCOUNTER — Ambulatory Visit: Payer: Medicaid Other | Admitting: Orthopaedic Surgery

## 2019-03-19 ENCOUNTER — Encounter: Payer: Self-pay | Admitting: Critical Care Medicine

## 2019-03-20 ENCOUNTER — Other Ambulatory Visit: Payer: Self-pay | Admitting: Critical Care Medicine

## 2019-03-20 MED ORDER — GABAPENTIN 300 MG PO CAPS: 300.0000 mg | ORAL_CAPSULE | Freq: Three times a day (TID) | ORAL | 3 refills | Status: DC

## 2019-03-20 MED ORDER — PANTOPRAZOLE SODIUM 40 MG PO TBEC: 40.0000 mg | DELAYED_RELEASE_TABLET | Freq: Every day | ORAL | 1 refills | Status: DC

## 2019-03-20 MED ORDER — MELOXICAM 15 MG PO TABS: 15.0000 mg | ORAL_TABLET | Freq: Every day | ORAL | 0 refills | Status: DC | PRN

## 2019-03-20 MED ORDER — THIAMINE HCL 100 MG PO TABS: 100.0000 mg | ORAL_TABLET | Freq: Every day | ORAL | 3 refills | Status: DC

## 2019-03-20 MED ORDER — CARVEDILOL 3.125 MG PO TABS: 3.1250 mg | ORAL_TABLET | Freq: Two times a day (BID) | ORAL | 2 refills | Status: DC

## 2019-03-20 MED ORDER — SACUBITRIL-VALSARTAN 24-26 MG PO TABS: 1.0000 | ORAL_TABLET | Freq: Two times a day (BID) | ORAL | 0 refills | Status: DC

## 2019-03-20 MED ORDER — FOLIC ACID 1 MG PO TABS: 1.0000 mg | ORAL_TABLET | Freq: Every day | ORAL | 0 refills | Status: DC

## 2019-03-20 MED ORDER — ALBUTEROL SULFATE HFA 108 (90 BASE) MCG/ACT IN AERS: 2.0000 | INHALATION_SPRAY | RESPIRATORY_TRACT | 0 refills | Status: DC | PRN

## 2019-03-20 MED ORDER — ROSUVASTATIN CALCIUM 10 MG PO TABS: 10.0000 mg | ORAL_TABLET | Freq: Every day | ORAL | 3 refills | Status: DC

## 2019-03-20 MED FILL — MELOXICAM 15 MG TABLET: 15 | 20 days supply | Qty: 20 | Fill #0

## 2019-03-20 MED FILL — ALBUTEROL SULFATE HFA 108 (: 108 (90 BAS | 16 days supply | Qty: 18 | Fill #0

## 2019-03-20 MED FILL — ENTRESTO 24 MG-26 MG TABLET: 24-26 | 30 days supply | Qty: 60 | Fill #0
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
## 2019-03-22 ENCOUNTER — Emergency Department (HOSPITAL_COMMUNITY)
Admission: EM | Admit: 2019-03-22 | Discharge: 2019-03-23 | Disposition: A | Discharge status: Home / Self Care | Payer: Medicaid Other | Attending: Emergency Medicine | Admitting: Emergency Medicine

## 2019-03-22 ENCOUNTER — Other Ambulatory Visit: Payer: Self-pay

## 2019-03-22 ENCOUNTER — Encounter (HOSPITAL_COMMUNITY): Payer: Self-pay

## 2019-03-22 DIAGNOSIS — F10929 Alcohol use, unspecified with intoxication, unspecified: Secondary | ICD-10-CM | POA: Diagnosis not present

## 2019-03-22 DIAGNOSIS — I2510 Atherosclerotic heart disease of native coronary artery without angina pectoris: Secondary | ICD-10-CM | POA: Insufficient documentation

## 2019-03-22 DIAGNOSIS — W19XXXA Unspecified fall, initial encounter: Secondary | ICD-10-CM

## 2019-03-22 DIAGNOSIS — F17220 Nicotine dependence, chewing tobacco, uncomplicated: Secondary | ICD-10-CM | POA: Insufficient documentation

## 2019-03-22 DIAGNOSIS — S199XXA Unspecified injury of neck, initial encounter: Secondary | ICD-10-CM | POA: Diagnosis not present

## 2019-03-22 DIAGNOSIS — Z79899 Other long term (current) drug therapy: Secondary | ICD-10-CM | POA: Insufficient documentation

## 2019-03-22 DIAGNOSIS — Y929 Unspecified place or not applicable: Secondary | ICD-10-CM | POA: Diagnosis not present

## 2019-03-22 DIAGNOSIS — S0081XA Abrasion of other part of head, initial encounter: Secondary | ICD-10-CM | POA: Diagnosis not present

## 2019-03-22 DIAGNOSIS — W010XXA Fall on same level from slipping, tripping and stumbling without subsequent striking against object, initial encounter: Secondary | ICD-10-CM | POA: Insufficient documentation

## 2019-03-22 DIAGNOSIS — M542 Cervicalgia: Secondary | ICD-10-CM | POA: Diagnosis not present

## 2019-03-22 DIAGNOSIS — S0091XA Abrasion of unspecified part of head, initial encounter: Secondary | ICD-10-CM | POA: Diagnosis not present

## 2019-03-22 DIAGNOSIS — F17210 Nicotine dependence, cigarettes, uncomplicated: Secondary | ICD-10-CM | POA: Insufficient documentation

## 2019-03-22 DIAGNOSIS — Y939 Activity, unspecified: Secondary | ICD-10-CM | POA: Insufficient documentation

## 2019-03-22 DIAGNOSIS — Y999 Unspecified external cause status: Secondary | ICD-10-CM | POA: Insufficient documentation

## 2019-03-22 DIAGNOSIS — S0990XA Unspecified injury of head, initial encounter: Secondary | ICD-10-CM | POA: Diagnosis present

## 2019-03-22 NOTE — ED Triage Notes (Signed)
He was observed to fall just outside of [employer]. He smells of ETOH and is in no distress. He denies any new injury. He has a few-day-old abrasion at right forehead area.

## 2019-03-22 NOTE — ED Provider Notes (Signed)
[facility] DEPT Provider Note   CSN: [id number] Arrival date & time: 03/22/19  1759     History Chief Complaint  Patient presents with  . Fall    Trevor Williams is a 59 y.o. male.  Patient presents to the emergency department with a chief complaint of fall.  He states that he tripped and fell today and hit his head.  He complains of right-sided head pain and neck pain.  History of alcohol abuse and homelessness.  Denies any other injuries.  Denies any treatments prior to arrival.  Nothing makes the symptoms better or worse.  The history is provided by the patient. No language interpreter was used.       Past Medical History:  Diagnosis Date  . Abnormal liver function tests   . Acute respiratory failure ([place])    a. 07/2018 requiring intubation - CAP/CHF.  Marland Kitchen Alcohol abuse   . Biventricular heart failure ([place])   . Cancer ([place])    skin (left hand)  . Delirium    a. h/o delirium while admitted  . Emphysema of lung ([place])   . Homelessness   . Macrocytic anemia   . Mild CAD    a. cath 07/2018 60% ostial ramus otherwise OK.  . Mitral regurgitation   . NICM (nonischemic cardiomyopathy) ([place])   . Protein calorie malnutrition ([place])   . Tricuspid regurgitation     Patient Active Problem List   Diagnosis Date Noted  . Chronic systolic heart failure ([place]) 03/03/2019  . Elevated LFTs 03/03/2019  . Left lateral knee pain 03/03/2019  . Iron deficiency anemia due to chronic blood loss 03/03/2019  . Hypomagnesemia 03/03/2019  . Skin lesion of face 03/03/2019  . GI bleed 01/26/2019  . Alcohol use 01/26/2019  . Homeless 01/26/2019  . Hypokalemia 01/26/2019  . Fall at home, initial encounter 01/26/2019  . Simple chronic bronchitis ([place]) 01/25/2015  . Tobacco use disorder 01/25/2015  . Chronic midline low back pain with sciatica 12/25/2014    Past Surgical History:  Procedure Laterality Date  . BIOPSY  01/28/2019   Procedure: BIOPSY;  Surgeon:  Otis Brace, MD;  Location: [facility];  Service: Gastroenterology;;  . BIOPSY  01/30/2019   Procedure: BIOPSY;  Surgeon: Otis Brace, MD;  Location: [facility];  Service: Gastroenterology;;  . COLONOSCOPY WITH PROPOFOL N/A 01/30/2019   Procedure: COLONOSCOPY WITH PROPOFOL;  Surgeon: Otis Brace, MD;  Location: [facility];  Service: Gastroenterology;  Laterality: N/A;  . ESOPHAGOGASTRODUODENOSCOPY (EGD) WITH PROPOFOL N/A 01/28/2019   Procedure: ESOPHAGOGASTRODUODENOSCOPY (EGD) WITH PROPOFOL;  Surgeon: Otis Brace, MD;  Location: [facility];  Service: Gastroenterology;  Laterality: N/A;  . HEMOSTASIS CLIP PLACEMENT  01/30/2019   Procedure: HEMOSTASIS CLIP PLACEMENT;  Surgeon: Otis Brace, MD;  Location: [facility];  Service: Gastroenterology;;  . POLYPECTOMY  01/30/2019   Procedure: POLYPECTOMY;  Surgeon: Otis Brace, MD;  Location: [facility];  Service: Gastroenterology;;  . RIGHT/LEFT HEART CATH AND CORONARY ANGIOGRAPHY N/A 08/09/2018   Procedure: RIGHT/LEFT HEART CATH AND CORONARY ANGIOGRAPHY;  Surgeon: Larey Dresser, MD;  Location: [facility];  Service: Cardiovascular;  Laterality: N/A;       Family History  Problem Relation Age of Onset  . Hypertension Maternal Grandfather     Social History   Tobacco Use  . Smoking status: Current Every Day Smoker    Packs/day: 1.00    Types: Cigarettes  . Smokeless tobacco: Current User  Substance Use Topics  . Alcohol use: Yes  Comment: Liquor daily - 1/2 of a fifth   . Drug use: No    Home Medications Prior to Admission medications   Medication Sig Start Date End Date Taking? Authorizing Provider  albuterol (VENTOLIN HFA) 108 (90 Base) MCG/ACT inhaler Inhale 2 puffs into the lungs every 4 (four) hours as needed for wheezing or shortness of breath. 03/20/19   Elsie Stain, MD  carvedilol (COREG) 3.125 MG tablet Take 1 tablet (3.125 mg total) by mouth 2 (two) times daily  with a meal. 03/20/19   Elsie Stain, MD  feeding supplement, ENSURE ENLIVE, (ENSURE ENLIVE) LIQD Take 237 mLs by mouth 3 (three) times daily between meals. 02/01/19   Elgergawy, Silver Huguenin, MD  folic acid (FOLVITE) 1 MG tablet Take 1 tablet (1 mg total) by mouth daily. 03/20/19   Elsie Stain, MD  gabapentin (NEURONTIN) 300 MG capsule Take 1 capsule (300 mg total) by mouth 3 (three) times daily. 03/20/19   Elsie Stain, MD  meloxicam (MOBIC) 15 MG tablet Take 1 tablet (15 mg total) by mouth daily as needed for pain (left knee pain). 03/20/19   Elsie Stain, MD  pantoprazole (PROTONIX) 40 MG tablet Take 1 tablet (40 mg total) by mouth daily. 03/20/19 04/19/19  Elsie Stain, MD  rosuvastatin (CRESTOR) 10 MG tablet Take 1 tablet (10 mg total) by mouth daily at 6 PM. 03/20/19   Elsie Stain, MD  sacubitril-valsartan (ENTRESTO) 24-26 MG Take 1 tablet by mouth 2 (two) times daily. 03/20/19   Elsie Stain, MD  thiamine 100 MG tablet Take 1 tablet (100 mg total) by mouth daily. 03/20/19   Elsie Stain, MD  triamcinolone cream (KENALOG) 0.1 % Apply 1 application topically 2 (two) times daily. To face lesions 02/19/19   Elsie Stain, MD    Allergies    Doxycycline, Ibuprofen, Rocephin [ceftriaxone], Tylenol [acetaminophen], and Vancomycin  Review of Systems   Review of Systems  All other systems reviewed and are negative.   Physical Exam Updated Vital Signs BP 102/64 (BP Location: Left Leg)   Pulse 94   Temp 98.9 F (37.2 C) (Oral)   Resp 16   Ht 5\' 7"  (1.702 m)   Wt 59 kg   SpO2 98%   BMI 20.36 kg/m   Physical Exam Vitals and nursing note reviewed.  Constitutional:      Appearance: He is well-developed.  HENT:     Head: Normocephalic and atraumatic.  Eyes:     Conjunctiva/sclera: Conjunctivae normal.  Cardiovascular:     Rate and Rhythm: Normal rate and regular rhythm.     Heart sounds: No murmur.  Pulmonary:     Effort: Pulmonary effort  is normal. No respiratory distress.     Breath sounds: Normal breath sounds.  Abdominal:     Palpations: Abdomen is soft.     Tenderness: There is no abdominal tenderness.  Musculoskeletal:        General: Normal range of motion.     Cervical back: Neck supple.  Skin:    General: Skin is warm and dry.  Neurological:     Mental Status: He is alert and oriented to person, place, and time.  Psychiatric:        Mood and Affect: Mood normal.        Behavior: Behavior normal.     ED Results / Procedures / Treatments   Labs (all labs ordered are listed, but only abnormal results are displayed) Labs  Reviewed - No data to display  EKG None  Radiology No results found.  Procedures Procedures (including critical care time)  Medications Ordered in ED Medications - No data to display  ED Course  I have reviewed the triage vital signs and the nursing notes.  Pertinent labs & imaging results that were available during my care of the patient were reviewed by me and considered in my medical decision making (see chart for details).    MDM Rules/Calculators/A&P                      Patient with a fall.  Intoxicated.  He does have an abrasion on his right forehead.  Will check head CT and cervical spine CT.  If negative, patient will need to sober up, and then can be discharged.  CTs are negative for any acute traumatic findings.  Patient ambulating without assistance.  Feel the patient is stable for discharge.  Final Clinical Impression(s) / ED Diagnoses Final diagnoses:  Fall, initial encounter  Alcoholic intoxication with complication [facility])    Rx / DC Orders ED Discharge Orders    None       Montine Circle, PA-C 03/23/19 0547    Merryl Hacker, MD 03/23/19 [phone]

## 2019-03-23 ENCOUNTER — Encounter (HOSPITAL_COMMUNITY): Payer: Self-pay | Admitting: Radiology

## 2019-03-23 ENCOUNTER — Other Ambulatory Visit: Payer: Self-pay

## 2019-03-23 ENCOUNTER — Emergency Department (HOSPITAL_COMMUNITY): Payer: Medicaid Other

## 2019-03-23 DIAGNOSIS — S0091XA Abrasion of unspecified part of head, initial encounter: Secondary | ICD-10-CM | POA: Diagnosis not present

## 2019-03-23 DIAGNOSIS — M542 Cervicalgia: Secondary | ICD-10-CM | POA: Diagnosis not present

## 2019-03-23 DIAGNOSIS — S199XXA Unspecified injury of neck, initial encounter: Secondary | ICD-10-CM | POA: Diagnosis not present

## 2019-03-23 NOTE — ED Notes (Signed)
Patient ambulatory to restroom and CT with cane. Patient requires no assistance with ambulation.
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
## 2019-03-23 NOTE — ED Notes (Signed)
Pt ambulated to CT

## 2019-03-26 ENCOUNTER — Ambulatory Visit: Payer: Medicaid Other | Admitting: Orthopaedic Surgery

## 2019-04-09 ENCOUNTER — Other Ambulatory Visit: Payer: Self-pay | Admitting: Critical Care Medicine

## 2019-04-09 ENCOUNTER — Encounter: Payer: Self-pay | Admitting: Critical Care Medicine

## 2019-04-09 MED ORDER — MELOXICAM 15 MG PO TABS: 15.0000 mg | ORAL_TABLET | Freq: Every day | ORAL | 0 refills | Status: DC | PRN

## 2019-04-09 MED ORDER — GABAPENTIN 300 MG PO CAPS: 300.0000 mg | ORAL_CAPSULE | Freq: Three times a day (TID) | ORAL | 3 refills | Status: DC

## 2019-04-09 NOTE — Congregational Nurse Program (Signed)
Mr. Danielewicz was seen in clinic this PM with Dr. Joya Gaskins. C/O black-tarry stools. Admits to increase of ETOH usage. Dr. Joya Gaskins will make appointment to be seen at [facility] and advised to keep appointment (ie. H/O no shows) Rx sent to [facility], CN will pick it up and deliver to client

## 2019-04-10 MED FILL — MELOXICAM 15 MG TABLET: 15 | 30 days supply | Qty: 30 | Fill #0

## 2019-04-10 MED FILL — GABAPENTIN 300 MG CAPSULE: 300 | 30 days supply | Qty: 90 | Fill #0
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
## 2019-04-22 ENCOUNTER — Ambulatory Visit (HOSPITAL_BASED_OUTPATIENT_CLINIC_OR_DEPARTMENT_OTHER): Payer: Medicaid Other | Admitting: Critical Care Medicine

## 2019-04-22 DIAGNOSIS — J410 Simple chronic bronchitis: Secondary | ICD-10-CM

## 2019-04-22 DIAGNOSIS — Z7289 Other problems related to lifestyle: Secondary | ICD-10-CM

## 2019-04-22 DIAGNOSIS — M25562 Pain in left knee: Secondary | ICD-10-CM

## 2019-04-22 DIAGNOSIS — Z590 Homelessness: Secondary | ICD-10-CM

## 2019-04-22 DIAGNOSIS — I5022 Chronic systolic (congestive) heart failure: Secondary | ICD-10-CM

## 2019-04-22 DIAGNOSIS — F17200 Nicotine dependence, unspecified, uncomplicated: Secondary | ICD-10-CM
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
## 2019-04-23 MED ORDER — GABAPENTIN 300 MG PO CAPS: 300.0000 mg | ORAL_CAPSULE | Freq: Three times a day (TID) | ORAL | 3 refills | Status: DC

## 2019-04-23 MED ORDER — CARVEDILOL 3.125 MG PO TABS: 3.1250 mg | ORAL_TABLET | Freq: Two times a day (BID) | ORAL | 2 refills | Status: DC

## 2019-04-23 NOTE — Congregational Nurse Program (Signed)
Mr. Schmidtke was seen by Dr.Wright in clinic for medication refill due to knee pain. Mr. Buczek has missed several appointments due to no show. Will attempt to schedule another appointment at [facility]. Medication will be picked up by CN at [facility] tomorrow. Addendum:2/9@2p  CHW

## 2019-04-24 ENCOUNTER — Encounter: Payer: Self-pay | Admitting: Critical Care Medicine

## 2019-04-24 NOTE — Assessment & Plan Note (Signed)
Chronic knee pain  Plan will be to get this patient referred to orthopedics again after getting back into the clinic setting and I refilled his gabapentin

## 2019-04-24 NOTE — Assessment & Plan Note (Signed)
Chronic systolic heart failure Plan will be to refill today the Coreg and will get this patient into the clinic as soon as possible

## 2019-04-28 ENCOUNTER — Emergency Department (HOSPITAL_COMMUNITY): Payer: Medicaid Other

## 2019-04-28 ENCOUNTER — Other Ambulatory Visit: Payer: Self-pay

## 2019-04-28 ENCOUNTER — Encounter (HOSPITAL_COMMUNITY): Payer: Self-pay | Admitting: Emergency Medicine

## 2019-04-28 ENCOUNTER — Inpatient Hospital Stay (HOSPITAL_COMMUNITY)
Admission: EM | Admit: 2019-04-28 | Discharge: 2019-05-07 | DRG: 481 | Disposition: A | Discharge status: Skilled Nursing Facility | Payer: Medicaid Other | Attending: Internal Medicine | Admitting: Internal Medicine

## 2019-04-28 DIAGNOSIS — R52 Pain, unspecified: Secondary | ICD-10-CM | POA: Diagnosis not present

## 2019-04-28 DIAGNOSIS — M25551 Pain in right hip: Secondary | ICD-10-CM | POA: Diagnosis not present

## 2019-04-28 DIAGNOSIS — Z8701 Personal history of pneumonia (recurrent): Secondary | ICD-10-CM

## 2019-04-28 DIAGNOSIS — S72144A Nondisplaced intertrochanteric fracture of right femur, initial encounter for closed fracture: Principal | ICD-10-CM | POA: Diagnosis present

## 2019-04-28 DIAGNOSIS — F10920 Alcohol use, unspecified with intoxication, uncomplicated: Secondary | ICD-10-CM

## 2019-04-28 DIAGNOSIS — F17210 Nicotine dependence, cigarettes, uncomplicated: Secondary | ICD-10-CM | POA: Diagnosis present

## 2019-04-28 DIAGNOSIS — Y9301 Activity, walking, marching and hiking: Secondary | ICD-10-CM | POA: Diagnosis present

## 2019-04-28 DIAGNOSIS — I5082 Biventricular heart failure: Secondary | ICD-10-CM | POA: Diagnosis present

## 2019-04-28 DIAGNOSIS — W19XXXA Unspecified fall, initial encounter: Secondary | ICD-10-CM | POA: Diagnosis not present

## 2019-04-28 DIAGNOSIS — R0902 Hypoxemia: Secondary | ICD-10-CM | POA: Diagnosis not present

## 2019-04-28 DIAGNOSIS — Z886 Allergy status to analgesic agent status: Secondary | ICD-10-CM

## 2019-04-28 DIAGNOSIS — W1809XA Striking against other object with subsequent fall, initial encounter: Secondary | ICD-10-CM | POA: Diagnosis present

## 2019-04-28 DIAGNOSIS — Z881 Allergy status to other antibiotic agents status: Secondary | ICD-10-CM

## 2019-04-28 DIAGNOSIS — S72009A Fracture of unspecified part of neck of unspecified femur, initial encounter for closed fracture: Secondary | ICD-10-CM | POA: Diagnosis present

## 2019-04-28 DIAGNOSIS — I2510 Atherosclerotic heart disease of native coronary artery without angina pectoris: Secondary | ICD-10-CM | POA: Diagnosis present

## 2019-04-28 DIAGNOSIS — I428 Other cardiomyopathies: Secondary | ICD-10-CM | POA: Diagnosis present

## 2019-04-28 DIAGNOSIS — Y908 Blood alcohol level of 240 mg/100 ml or more: Secondary | ICD-10-CM | POA: Diagnosis present

## 2019-04-28 DIAGNOSIS — M25572 Pain in left ankle and joints of left foot: Secondary | ICD-10-CM | POA: Diagnosis not present

## 2019-04-28 DIAGNOSIS — S199XXA Unspecified injury of neck, initial encounter: Secondary | ICD-10-CM | POA: Diagnosis not present

## 2019-04-28 DIAGNOSIS — I5042 Chronic combined systolic (congestive) and diastolic (congestive) heart failure: Secondary | ICD-10-CM | POA: Diagnosis present

## 2019-04-28 DIAGNOSIS — Z8249 Family history of ischemic heart disease and other diseases of the circulatory system: Secondary | ICD-10-CM

## 2019-04-28 DIAGNOSIS — Z5900 Homelessness unspecified: Secondary | ICD-10-CM

## 2019-04-28 DIAGNOSIS — J410 Simple chronic bronchitis: Secondary | ICD-10-CM | POA: Diagnosis present

## 2019-04-28 DIAGNOSIS — J439 Emphysema, unspecified: Secondary | ICD-10-CM | POA: Diagnosis present

## 2019-04-28 DIAGNOSIS — I083 Combined rheumatic disorders of mitral, aortic and tricuspid valves: Secondary | ICD-10-CM | POA: Diagnosis present

## 2019-04-28 DIAGNOSIS — Z419 Encounter for procedure for purposes other than remedying health state, unspecified: Secondary | ICD-10-CM

## 2019-04-28 DIAGNOSIS — D696 Thrombocytopenia, unspecified: Secondary | ICD-10-CM | POA: Diagnosis present

## 2019-04-28 DIAGNOSIS — F17200 Nicotine dependence, unspecified, uncomplicated: Secondary | ICD-10-CM | POA: Diagnosis present

## 2019-04-28 DIAGNOSIS — S79911A Unspecified injury of right hip, initial encounter: Secondary | ICD-10-CM | POA: Diagnosis not present

## 2019-04-28 DIAGNOSIS — Z20822 Contact with and (suspected) exposure to covid-19: Secondary | ICD-10-CM | POA: Diagnosis present

## 2019-04-28 DIAGNOSIS — F10120 Alcohol abuse with intoxication, uncomplicated: Secondary | ICD-10-CM | POA: Diagnosis present

## 2019-04-28 DIAGNOSIS — Z79899 Other long term (current) drug therapy: Secondary | ICD-10-CM

## 2019-04-28 DIAGNOSIS — S0990XA Unspecified injury of head, initial encounter: Secondary | ICD-10-CM | POA: Diagnosis not present

## 2019-04-28 DIAGNOSIS — I5022 Chronic systolic (congestive) heart failure: Secondary | ICD-10-CM | POA: Diagnosis present

## 2019-04-28 LAB — CBC WITH DIFFERENTIAL/PLATELET
Abs Immature Granulocytes: 0.02 10*3/uL (ref 0.00–0.07)
Basophils Absolute: 0 10*3/uL (ref 0.0–0.1)
Basophils Relative: 1 %
Eosinophils Absolute: 0.2 10*3/uL (ref 0.0–0.5)
Eosinophils Relative: 3 %
HCT: 46.4 % (ref 39.0–52.0)
Hemoglobin: 15.2 g/dL (ref 13.0–17.0)
Immature Granulocytes: 0 %
Lymphocytes Relative: 42 %
Lymphs Abs: 2.1 10*3/uL (ref 0.7–4.0)
MCH: 35.6 pg — ABNORMAL HIGH (ref 26.0–34.0)
MCHC: 32.8 g/dL (ref 30.0–36.0)
MCV: 108.7 fL — ABNORMAL HIGH (ref 80.0–100.0)
Monocytes Absolute: 0.4 10*3/uL (ref 0.1–1.0)
Monocytes Relative: 8 %
Neutro Abs: 2.3 10*3/uL (ref 1.7–7.7)
Neutrophils Relative %: 46 %
Platelets: 80 10*3/uL — ABNORMAL LOW (ref 150–400)
RBC: 4.27 MIL/uL (ref 4.22–5.81)
RDW: 12.9 % (ref 11.5–15.5)
WBC: 4.9 10*3/uL (ref 4.0–10.5)
nRBC: 0 % (ref 0.0–0.2)

## 2019-04-28 LAB — COMPREHENSIVE METABOLIC PANEL
ALT: 31 U/L (ref 0–44)
AST: 65 U/L — ABNORMAL HIGH (ref 15–41)
Albumin: 3.9 g/dL (ref 3.5–5.0)
Alkaline Phosphatase: 99 U/L (ref 38–126)
Anion gap: 10 (ref 5–15)
BUN: 7 mg/dL (ref 6–20)
CO2: 27 mmol/L (ref 22–32)
Calcium: 9.1 mg/dL (ref 8.9–10.3)
Chloride: 105 mmol/L (ref 98–111)
Creatinine, Ser: 0.67 mg/dL (ref 0.61–1.24)
GFR calc Af Amer: >60 mL/min (ref >60)
GFR calc non Af Amer: >60 mL/min (ref >60)
Glucose, Bld: 90 mg/dL (ref 70–99)
Potassium: 4 mmol/L (ref 3.5–5.1)
Sodium: 142 mmol/L (ref 135–145)
Total Bilirubin: 0.6 mg/dL (ref 0.3–1.2)
Total Protein: 7.8 g/dL (ref 6.5–8.1)

## 2019-04-28 LAB — ETHANOL: Alcohol, Ethyl (B): 331 mg/dL (ref <10)

## 2019-04-28 MED ORDER — ONDANSETRON HCL 4 MG/2ML IJ SOLN
4.0000 mg | Freq: Once | INTRAMUSCULAR | Status: AC
  Administered 2019-04-28: 4 mg via INTRAVENOUS
  Filled 2019-04-28: qty 2

## 2019-04-28 MED ORDER — THIAMINE HCL 100 MG/ML IJ SOLN
100.0000 mg | Freq: Once | INTRAMUSCULAR | Status: AC
  Administered 2019-04-29: 100 mg via INTRAVENOUS
  Filled 2019-04-28: qty 1

## 2019-04-28 MED ORDER — FENTANYL CITRATE (PF) 100 MCG/2ML IJ SOLN
50.0000 ug | Freq: Once | INTRAMUSCULAR | Status: AC
  Administered 2019-04-28: 50 ug via INTRAVENOUS
  Filled 2019-04-28: qty 2

## 2019-04-28 MED ORDER — SODIUM CHLORIDE 0.9 % IV BOLUS
1000.0000 mL | Freq: Once | INTRAVENOUS | Status: AC
  Administered 2019-04-28: 1000 mL via INTRAVENOUS

## 2019-04-28 MED ORDER — MORPHINE SULFATE (PF) 4 MG/ML IV SOLN
4.0000 mg | Freq: Once | INTRAVENOUS | Status: AC
  Administered 2019-04-28: 4 mg via INTRAVENOUS
  Filled 2019-04-28: qty 1

## 2019-04-28 NOTE — ED Notes (Signed)
Patient transported to CT 

## 2019-04-28 NOTE — ED Provider Notes (Signed)
[facility] DEPT Provider Note   CSN: [id number] Arrival date & time: 04/28/19  1902     History Chief Complaint  Patient presents with  . Hip Pain    after fall from standing    Trevor Williams is a 59 y.o. male past medical history of abnormal liver function, respiratory failure, alcohol abuse, homelessness who presents for evaluation of a fall that occurred at about 4:56 PM this afternoon.  Patient reports he was walking and tripped over a rock, landing on his right hip.  He states he has had difficulty ambulating since the incident.  He states that her hip hurts worse with attempts to move.  He states that he also hit his head and lost consciousness.  He is not on any blood thinners.  Patient states he has pain to his right hip since the incident.  He is also reporting some head and neck pain.  Patient states that the fall was mechanical.  He denies any difficulty breathing, chest pain, abdominal pain.  He denies any numbness/weakness.  The history is provided by the patient.       Past Medical History:  Diagnosis Date  . Abnormal liver function tests   . Acute respiratory failure ([place])    a. 07/2018 requiring intubation - CAP/CHF.  Marland Kitchen Alcohol abuse   . Biventricular heart failure ([place])   . Cancer ([place])    skin (left hand)  . Delirium    a. h/o delirium while admitted  . Emphysema of lung ([place])   . Homelessness   . Macrocytic anemia   . Mild CAD    a. cath 07/2018 60% ostial ramus otherwise OK.  . Mitral regurgitation   . NICM (nonischemic cardiomyopathy) ([place])   . Protein calorie malnutrition ([place])   . Tricuspid regurgitation     Patient Active Problem List   Diagnosis Date Noted  . Hip fracture ([place]) 04/29/2019  . Closed nondisplaced intertrochanteric fracture of right femur ([place])   . Alcoholic intoxication without complication ([place]) [id number]  . Chronic systolic heart failure ([place]) 03/03/2019  . Elevated LFTs 03/03/2019  . Left  lateral knee pain 03/03/2019  . Iron deficiency anemia due to chronic blood loss 03/03/2019  . Hypomagnesemia 03/03/2019  . Skin lesion of face 03/03/2019  . GI bleed 01/26/2019  . Alcohol use 01/26/2019  . Homeless 01/26/2019  . Hypokalemia 01/26/2019  . Fall at home, initial encounter 01/26/2019  . Simple chronic bronchitis ([place]) 01/25/2015  . Tobacco use disorder 01/25/2015  . Chronic midline low back pain with sciatica 12/25/2014    Past Surgical History:  Procedure Laterality Date  . BIOPSY  01/28/2019   Procedure: BIOPSY;  Surgeon: Otis Brace, MD;  Location: [facility];  Service: Gastroenterology;;  . BIOPSY  01/30/2019   Procedure: BIOPSY;  Surgeon: Otis Brace, MD;  Location: [facility];  Service: Gastroenterology;;  . COLONOSCOPY WITH PROPOFOL N/A 01/30/2019   Procedure: COLONOSCOPY WITH PROPOFOL;  Surgeon: Otis Brace, MD;  Location: [facility];  Service: Gastroenterology;  Laterality: N/A;  . ESOPHAGOGASTRODUODENOSCOPY (EGD) WITH PROPOFOL N/A 01/28/2019   Procedure: ESOPHAGOGASTRODUODENOSCOPY (EGD) WITH PROPOFOL;  Surgeon: Otis Brace, MD;  Location: [facility];  Service: Gastroenterology;  Laterality: N/A;  . HEMOSTASIS CLIP PLACEMENT  01/30/2019   Procedure: HEMOSTASIS CLIP PLACEMENT;  Surgeon: Otis Brace, MD;  Location: [facility];  Service: Gastroenterology;;  . POLYPECTOMY  01/30/2019   Procedure: POLYPECTOMY;  Surgeon: Otis Brace, MD;  Location: [facility];  Service: Gastroenterology;;  . RIGHT/LEFT  HEART CATH AND CORONARY ANGIOGRAPHY N/A 08/09/2018   Procedure: RIGHT/LEFT HEART CATH AND CORONARY ANGIOGRAPHY;  Surgeon: Larey Dresser, MD;  Location: [facility];  Service: Cardiovascular;  Laterality: N/A;       Family History  Problem Relation Age of Onset  . Hypertension Maternal Grandfather     Social History   Tobacco Use  . Smoking status: Current Every Day Smoker    Packs/day: 1.00    Types:  Cigarettes  . Smokeless tobacco: Current User  Substance Use Topics  . Alcohol use: Yes    Comment: Liquor daily - 1/2 of a fifth   . Drug use: No    Home Medications Prior to Admission medications   Medication Sig Start Date End Date Taking? Authorizing Provider  albuterol (VENTOLIN HFA) 108 (90 Base) MCG/ACT inhaler Inhale 2 puffs into the lungs every 4 (four) hours as needed for wheezing or shortness of breath. 03/20/19  Yes Elsie Stain, MD  carvedilol (COREG) 3.125 MG tablet Take 1 tablet (3.125 mg total) by mouth 2 (two) times daily with a meal. 04/23/19  Yes Elsie Stain, MD  feeding supplement, ENSURE ENLIVE, (ENSURE ENLIVE) LIQD Take 237 mLs by mouth 3 (three) times daily between meals. 02/01/19  Yes Elgergawy, Silver Huguenin, MD  folic acid (FOLVITE) 1 MG tablet Take 1 tablet (1 mg total) by mouth daily. 03/20/19  Yes Elsie Stain, MD  gabapentin (NEURONTIN) 300 MG capsule Take 1 capsule (300 mg total) by mouth 3 (three) times daily. 04/23/19  Yes Elsie Stain, MD  rosuvastatin (CRESTOR) 10 MG tablet Take 1 tablet (10 mg total) by mouth daily at 6 PM. 03/20/19  Yes Elsie Stain, MD  sacubitril-valsartan (ENTRESTO) 24-26 MG Take 1 tablet by mouth 2 (two) times daily. 03/20/19  Yes Elsie Stain, MD  thiamine 100 MG tablet Take 1 tablet (100 mg total) by mouth daily. 03/20/19  Yes Elsie Stain, MD  pantoprazole (PROTONIX) 40 MG tablet Take 1 tablet (40 mg total) by mouth daily. 03/20/19 04/19/19  Elsie Stain, MD    Allergies    Doxycycline, Ibuprofen, Rocephin [ceftriaxone], Tylenol [acetaminophen], and Vancomycin  Review of Systems   Review of Systems  Constitutional: Negative for fever.  Respiratory: Negative for cough and shortness of breath.   Cardiovascular: Negative for chest pain.  Gastrointestinal: Negative for abdominal pain, nausea and vomiting.  Genitourinary: Negative for dysuria and hematuria.  Musculoskeletal: Positive for neck pain.        Hip pain  Neurological: Positive for headaches. Negative for weakness and numbness.  All other systems reviewed and are negative.   Physical Exam Updated Vital Signs BP 105/69   Pulse 78   Temp 98.3 F (36.8 C) (Oral)   Resp 17   Wt 59 kg   SpO2 99%   BMI 20.36 kg/m   Physical Exam Vitals and nursing note reviewed.  Constitutional:      Appearance: Normal appearance. He is well-developed.  HENT:     Head: Normocephalic and atraumatic.     Comments: No traumatic injury noted but patient with tenderness palpation noted to the right side of his head.  No underlying skull deformity or crepitus noted. Eyes:     General: Lids are normal.     Conjunctiva/sclera: Conjunctivae normal.     Pupils: Pupils are equal, round, and reactive to light.     Comments: PERRL. EOMs intact. No nystagmus. No neglect.   Neck:  Comments: Patient with diffuse tenderness noted to the midline C-spine at approximately C4, C5.  No deformity or crepitus noted.  Full range of motion without any difficulty. Cardiovascular:     Rate and Rhythm: Normal rate and regular rhythm.     Pulses: Normal pulses.          Radial pulses are 2+ on the right side and 2+ on the left side.       Dorsalis pedis pulses are 2+ on the right side and 2+ on the left side.     Heart sounds: Normal heart sounds. No murmur. No friction rub. No gallop.   Pulmonary:     Effort: Pulmonary effort is normal.     Breath sounds: Normal breath sounds.     Comments: Lungs clear to auscultation bilaterally.  Symmetric chest rise.  No wheezing, rales, rhonchi. Abdominal:     Palpations: Abdomen is soft. Abdomen is not rigid.     Tenderness: There is no abdominal tenderness. There is no guarding.     Comments: Abdomen is soft, non-distended, non-tender. No rigidity, No guarding. No peritoneal signs.  Musculoskeletal:        General: Normal range of motion.     Comments: Tenderness palpation noted to the right hip.  No deformity or  crepitus noted.  Limited flexion/tension secondary to pain.  No bony tenderness noted to right knee, right tib-fib, right ankle.  No bony tenderness noted to left lower extremity.  Full range of motion of left lower extremity intact with any difficulty.  Skin:    General: Skin is warm and dry.     Capillary Refill: Capillary refill takes less than 2 seconds.     Comments: Good distal cap refill.  RLE is not dusky in appearance or cool to touch.  Neurological:     Mental Status: He is alert and oriented to person, place, and time.     Comments: Follows commands, Moves all extremities  5/5 strength to BUE and BLE  Sensation intact throughout all major nerve distributions  Psychiatric:        Speech: Speech normal.     ED Results / Procedures / Treatments   Labs (all labs ordered are listed, but only abnormal results are displayed) Labs Reviewed  CBC WITH DIFFERENTIAL/PLATELET - Abnormal; Notable for the following components:      Result Value   MCV 108.7 (*)    MCH 35.6 (*)    Platelets 80 (*)    All other components within normal limits  ETHANOL - Abnormal; Notable for the following components:   Alcohol, Ethyl (B) 331 (*)    All other components within normal limits  COMPREHENSIVE METABOLIC PANEL - Abnormal; Notable for the following components:   AST 65 (*)    All other components within normal limits  SARS CORONAVIRUS 2 (TAT 6-24 HRS)    EKG None  Radiology CT Head Wo Contrast  Result Date: 04/28/2019 CLINICAL DATA:  59 year old male with fall. EXAM: CT HEAD WITHOUT CONTRAST CT CERVICAL SPINE WITHOUT CONTRAST TECHNIQUE: Multidetector CT imaging of the head and cervical spine was performed following the standard protocol without intravenous contrast. Multiplanar CT image reconstructions of the cervical spine were also generated. COMPARISON:  Head CT dated 03/23/2019. 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 There is  otherwise background of age-related atrophy and chronic microvascular ischemic changes. Incidental note of a cavum septum pellucidum and cavum vergae. There is no acute intracranial  hemorrhage. No mass effect or midline shift. No extra-axial fluid collection. Vascular: No hyperdense vessel or unexpected calcification. Skull: Normal. Negative for fracture or focal lesion. Sinuses/Orbits: No acute finding. Other: None CT CERVICAL SPINE FINDINGS Alignment: No acute subluxation. Skull base and vertebrae: No acute fracture. Soft tissues and spinal canal: No prevertebral fluid or swelling. No visible canal hematoma. Disc levels: Multilevel degenerative changes with endplate irregularity and disc space narrowing and spurring. Upper chest: Large right apical subpleural blebs. Other: None IMPRESSION: 1. No acute intracranial hemorrhage. 2. Age-related atrophy and chronic microvascular ischemic changes. Old right MCA territory infarct. 3. No acute/traumatic cervical spine pathology. Multilevel degenerative changes. Electronically Signed   By: Anner Crete M.D.   On: 04/28/2019 22:22   CT Cervical Spine Wo Contrast  Result Date: 04/28/2019 CLINICAL DATA:  58 year old male with fall. EXAM: CT HEAD WITHOUT CONTRAST CT CERVICAL SPINE WITHOUT CONTRAST TECHNIQUE: Multidetector CT imaging of the head and cervical spine was performed following the standard protocol without intravenous contrast. Multiplanar CT image reconstructions of the cervical spine were also generated. COMPARISON:  Head CT dated 03/23/2019. 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 There is otherwise background of age-related atrophy and chronic microvascular ischemic changes. Incidental note of a cavum septum pellucidum and cavum vergae. There is no acute intracranial hemorrhage. No mass effect or midline shift. No extra-axial fluid collection. Vascular: No hyperdense vessel or unexpected calcification.  Skull: Normal. Negative for fracture or focal lesion. Sinuses/Orbits: No acute finding. Other: None CT CERVICAL SPINE FINDINGS Alignment: No acute subluxation. Skull base and vertebrae: No acute fracture. Soft tissues and spinal canal: No prevertebral fluid or swelling. No visible canal hematoma. Disc levels: Multilevel degenerative changes with endplate irregularity and disc space narrowing and spurring. Upper chest: Large right apical subpleural blebs. Other: None IMPRESSION: 1. No acute intracranial hemorrhage. 2. Age-related atrophy and chronic microvascular ischemic changes. Old right MCA territory infarct. 3. No acute/traumatic cervical spine pathology. Multilevel degenerative changes. Electronically Signed   By: Anner Crete M.D.   On: 04/28/2019 22:22   CT PELVIS WO CONTRAST  Result Date: 04/28/2019 CLINICAL DATA:  Right hip pain after injury EXAM: CT PELVIS WITHOUT CONTRAST TECHNIQUE: Multidetector CT imaging of the pelvis was performed following the standard protocol without intravenous contrast. COMPARISON:  Radiograph February 20, 2019 FINDINGS: Urinary Tract: The visualized distal ureters and bladder appear unremarkable. Bowel: No bowel wall thickening, distention or surrounding inflammation identified within the pelvis. Scattered colonic diverticula are noted without diverticulitis. Vascular/Lymphatic: No enlarged pelvic lymph nodes identified. Scattered aortic atherosclerotic calcifications are seen without aneurysmal dilatation. Reproductive:  The prostate is unremarkable. Other: Mild soft tissue swelling seen over the right gluteal musculature. No soft tissue hematoma seen. Musculoskeletal: There is a healed fracture deformity seen at the anterior right iliac wing with small heterotopic ossification. There is a nondisplaced right intratrochanteric fracture extending through the superior portion of the greater trochanter. The femoral head is still well seated within the acetabulum. There is  moderate bilateral hip osteoarthritis. Degenerative changes seen in the lower lumbar spine. There is heterogeneous subtle enlargement of the posterior right gluteal musculature, likely intramuscular edema. IMPRESSION: Acute nondisplaced right intratrochanteric fracture extending through the superior posterior greater trochanter. Right gluteal musculature edema and overlying soft tissue swelling. Prior healed fracture deformity of the anterior right iliac wing with heterotopic ossification. Electronically Signed   By: Prudencio Pair M.D.   On: 04/28/2019 22:23   DG Hips Bilat W or  Wo Pelvis 3-4 Views  Result Date: 04/28/2019 CLINICAL DATA:  [place] from standing, right hip pain EXAM: DG HIP (WITH OR WITHOUT PELVIS) 3-4V BILAT COMPARISON:  02/20/2019 FINDINGS: Frontal view of the pelvis as well as frontal and frogleg lateral views of the right hip are obtained. Cassette artifact limits evaluation. There are numerous skin folds overlying the right hip. However, there is a subtle lucency perpendicular to the trabecular markings in the inter trochanteric region of the central aspect of the proximal right femur, seen on both the frontal and frogleg lateral views. I cannot exclude a nondisplaced fracture, and CT or MRI may be useful for further evaluation. No other acute displaced fractures. The hips are well aligned. There remainder of the bony pelvis is normal. IMPRESSION: 1. Possible nondisplaced intertrochanteric right hip fracture as above. CT or MRI recommended for further evaluation. Electronically Signed   By: Randa Ngo M.D.   On: 04/28/2019 20:16    Procedures Procedures (including critical care time)  Medications Ordered in ED Medications  thiamine (B-1) injection 100 mg (has no administration in time range)  fentaNYL (SUBLIMAZE) injection 50 mcg (50 mcg Intravenous Given 04/28/19 2109)  ondansetron (ZOFRAN) injection 4 mg (4 mg Intravenous Given 04/28/19 2109)  sodium chloride 0.9 % bolus 1,000 mL  (1,000 mLs Intravenous New Bag/Given 04/28/19 2355)  ondansetron (ZOFRAN) injection 4 mg (4 mg Intravenous Given 04/28/19 2358)  morphine 4 MG/ML injection 4 mg (4 mg Intravenous Given 04/28/19 2358)    ED Course  I have reviewed the triage vital signs and the nursing notes.  Pertinent labs & imaging results that were available during my care of the patient were reviewed by me and considered in my medical decision making (see chart for details).    MDM Rules/Calculators/A&P                      59 year old male who presents for evaluation of mechanical fall that occurred just prior to ED arrival.  Patient reports that he did hit his head and lose consciousness.  No blood thinners. Patient is afebrile, non-toxic appearing, sitting comfortably on examination table. Vital signs reviewed and stable. Patient is neurovascularly intact.  On exam, I did not see any trauma noted to the head but he does have tenderness with palpation noted to the light right side.  We will plan for head CT, CT C-spine.  Additionally, plan for imaging of the hip.  Hip x-ray reviewed.  There is a possible nondisplaced intertrochanteric right hip fracture as seen above.  They recommend CT or MRI for further evaluation.  We will plan for CT evaluation.  Ethanol is elevated at 331.  CBC shows no leukocytosis or anemia.  CMP is unremarkable.  Platelets are 80.  CT head shows no intracranial abnormality.  No acute traumatic cervical spine injury.  CT pelvis shows acute nondisplaced right intertrochanteric fracture extending through the superior posterior greater trochanter.  He has right gluteal musculature edema and overlying soft tissue swelling.  Discussed patient with Dr. Alvan Dame (Ortho).  He recommends medicine admission.  He states that patient will need to be evened out with his alcohol before doing surgery.  He does not anticipate him doing surgery tomorrow.  I discussed with hospitalist who plans for admission.  Portions of  this note were generated with [employer]. Dictation errors may occur despite best attempts at proofreading.   Final Clinical Impression(s) / ED Diagnoses Final diagnoses:  Closed nondisplaced intertrochanteric fracture of  right femur, initial encounter [facility])  Alcoholic intoxication without complication [facility])    Rx / DC Orders ED Discharge Orders    None       Volanda Napoleon, PA-C 04/29/19 0100    Margette Fast, MD 04/29/19 1021

## 2019-04-28 NOTE — ED Notes (Signed)
Date and time results received: 04/28/19 2150   Test: ETOH Critical Value: 331  Name of Provider Notified: Ria Comment L. PA  Orders Received? Or Actions Taken?:

## 2019-04-28 NOTE — ED Triage Notes (Signed)
Fall from standing into metal bed frame striking right hip. Denies LOC or other injury c/o pain to right hip.

## 2019-04-28 NOTE — ED Notes (Signed)
Pt in xray

## 2019-04-29 ENCOUNTER — Other Ambulatory Visit: Payer: Self-pay

## 2019-04-29 ENCOUNTER — Ambulatory Visit: Payer: Medicaid Other | Admitting: Critical Care Medicine

## 2019-04-29 ENCOUNTER — Inpatient Hospital Stay (HOSPITAL_COMMUNITY): Payer: Medicaid Other

## 2019-04-29 DIAGNOSIS — I5022 Chronic systolic (congestive) heart failure: Secondary | ICD-10-CM

## 2019-04-29 DIAGNOSIS — I5042 Chronic combined systolic (congestive) and diastolic (congestive) heart failure: Secondary | ICD-10-CM | POA: Diagnosis present

## 2019-04-29 DIAGNOSIS — F17210 Nicotine dependence, cigarettes, uncomplicated: Secondary | ICD-10-CM | POA: Diagnosis present

## 2019-04-29 DIAGNOSIS — S0990XA Unspecified injury of head, initial encounter: Secondary | ICD-10-CM | POA: Diagnosis not present

## 2019-04-29 DIAGNOSIS — I5031 Acute diastolic (congestive) heart failure: Secondary | ICD-10-CM | POA: Diagnosis not present

## 2019-04-29 DIAGNOSIS — F10920 Alcohol use, unspecified with intoxication, uncomplicated: Secondary | ICD-10-CM

## 2019-04-29 DIAGNOSIS — F10120 Alcohol abuse with intoxication, uncomplicated: Secondary | ICD-10-CM | POA: Diagnosis present

## 2019-04-29 DIAGNOSIS — Z8249 Family history of ischemic heart disease and other diseases of the circulatory system: Secondary | ICD-10-CM | POA: Diagnosis not present

## 2019-04-29 DIAGNOSIS — S72001A Fracture of unspecified part of neck of right femur, initial encounter for closed fracture: Secondary | ICD-10-CM

## 2019-04-29 DIAGNOSIS — S72009A Fracture of unspecified part of neck of unspecified femur, initial encounter for closed fracture: Secondary | ICD-10-CM | POA: Diagnosis present

## 2019-04-29 DIAGNOSIS — Y908 Blood alcohol level of 240 mg/100 ml or more: Secondary | ICD-10-CM | POA: Diagnosis present

## 2019-04-29 DIAGNOSIS — I083 Combined rheumatic disorders of mitral, aortic and tricuspid valves: Secondary | ICD-10-CM | POA: Diagnosis present

## 2019-04-29 DIAGNOSIS — S199XXA Unspecified injury of neck, initial encounter: Secondary | ICD-10-CM | POA: Diagnosis not present

## 2019-04-29 DIAGNOSIS — Y9301 Activity, walking, marching and hiking: Secondary | ICD-10-CM | POA: Diagnosis present

## 2019-04-29 DIAGNOSIS — Z79899 Other long term (current) drug therapy: Secondary | ICD-10-CM | POA: Diagnosis not present

## 2019-04-29 DIAGNOSIS — M25551 Pain in right hip: Secondary | ICD-10-CM | POA: Diagnosis not present

## 2019-04-29 DIAGNOSIS — Z20822 Contact with and (suspected) exposure to covid-19: Secondary | ICD-10-CM | POA: Diagnosis present

## 2019-04-29 DIAGNOSIS — S72141A Displaced intertrochanteric fracture of right femur, initial encounter for closed fracture: Secondary | ICD-10-CM | POA: Diagnosis not present

## 2019-04-29 DIAGNOSIS — I5082 Biventricular heart failure: Secondary | ICD-10-CM | POA: Diagnosis present

## 2019-04-29 DIAGNOSIS — Z881 Allergy status to other antibiotic agents status: Secondary | ICD-10-CM | POA: Diagnosis not present

## 2019-04-29 DIAGNOSIS — Z590 Homelessness: Secondary | ICD-10-CM | POA: Diagnosis not present

## 2019-04-29 DIAGNOSIS — S79911A Unspecified injury of right hip, initial encounter: Secondary | ICD-10-CM | POA: Diagnosis not present

## 2019-04-29 DIAGNOSIS — S72144A Nondisplaced intertrochanteric fracture of right femur, initial encounter for closed fracture: Secondary | ICD-10-CM | POA: Diagnosis not present

## 2019-04-29 DIAGNOSIS — I2510 Atherosclerotic heart disease of native coronary artery without angina pectoris: Secondary | ICD-10-CM | POA: Diagnosis not present

## 2019-04-29 DIAGNOSIS — D696 Thrombocytopenia, unspecified: Secondary | ICD-10-CM | POA: Diagnosis present

## 2019-04-29 DIAGNOSIS — Z8701 Personal history of pneumonia (recurrent): Secondary | ICD-10-CM | POA: Diagnosis not present

## 2019-04-29 DIAGNOSIS — W1809XA Striking against other object with subsequent fall, initial encounter: Secondary | ICD-10-CM | POA: Diagnosis present

## 2019-04-29 DIAGNOSIS — Z886 Allergy status to analgesic agent status: Secondary | ICD-10-CM | POA: Diagnosis not present

## 2019-04-29 DIAGNOSIS — I428 Other cardiomyopathies: Secondary | ICD-10-CM | POA: Diagnosis present

## 2019-04-29 DIAGNOSIS — J439 Emphysema, unspecified: Secondary | ICD-10-CM | POA: Diagnosis present

## 2019-04-29 DIAGNOSIS — D500 Iron deficiency anemia secondary to blood loss (chronic): Secondary | ICD-10-CM | POA: Diagnosis not present

## 2019-04-29 LAB — CBC
HCT: 41.4 % (ref 39.0–52.0)
Hemoglobin: 13.3 g/dL (ref 13.0–17.0)
MCH: 35.8 pg — ABNORMAL HIGH (ref 26.0–34.0)
MCHC: 32.1 g/dL (ref 30.0–36.0)
MCV: 111.6 fL — ABNORMAL HIGH (ref 80.0–100.0)
Platelets: 75 10*3/uL — ABNORMAL LOW (ref 150–400)
RBC: 3.71 MIL/uL — ABNORMAL LOW (ref 4.22–5.81)
RDW: 13 % (ref 11.5–15.5)
WBC: 6.7 10*3/uL (ref 4.0–10.5)
nRBC: 0.3 % — ABNORMAL HIGH (ref 0.0–0.2)

## 2019-04-29 LAB — URINALYSIS, COMPLETE (UACMP) WITH MICROSCOPIC
Bilirubin Urine: NEGATIVE
Glucose, UA: NEGATIVE mg/dL
Hgb urine dipstick: NEGATIVE
Ketones, ur: NEGATIVE mg/dL
Leukocytes,Ua: NEGATIVE
Nitrite: NEGATIVE
Protein, ur: NEGATIVE mg/dL
Specific Gravity, Urine: 1.021 (ref 1.005–1.030)
pH: 5 (ref 5.0–8.0)

## 2019-04-29 LAB — BASIC METABOLIC PANEL
Anion gap: 8 (ref 5–15)
BUN: 7 mg/dL (ref 6–20)
CO2: 24 mmol/L (ref 22–32)
Calcium: 8.1 mg/dL — ABNORMAL LOW (ref 8.9–10.3)
Chloride: 109 mmol/L (ref 98–111)
Creatinine, Ser: 0.71 mg/dL (ref 0.61–1.24)
GFR calc Af Amer: >60 mL/min (ref >60)
GFR calc non Af Amer: >60 mL/min (ref >60)
Glucose, Bld: 92 mg/dL (ref 70–99)
Potassium: 4.7 mmol/L (ref 3.5–5.1)
Sodium: 141 mmol/L (ref 135–145)

## 2019-04-29 LAB — SURGICAL PCR SCREEN
MRSA, PCR: NEGATIVE
Staphylococcus aureus: NEGATIVE

## 2019-04-29 LAB — BRAIN NATRIURETIC PEPTIDE: B Natriuretic Peptide: 163.2 pg/mL — ABNORMAL HIGH (ref 0.0–100.0)

## 2019-04-29 LAB — TYPE AND SCREEN
ABO/RH(D): A POS
Antibody Screen: NEGATIVE

## 2019-04-29 LAB — MAGNESIUM: Magnesium: 2 mg/dL (ref 1.7–2.4)

## 2019-04-29 LAB — SARS CORONAVIRUS 2 (TAT 6-24 HRS): SARS Coronavirus 2: NEGATIVE

## 2019-04-29 LAB — PHOSPHORUS: Phosphorus: 4.7 mg/dL — ABNORMAL HIGH (ref 2.5–4.6)

## 2019-04-29 MED ORDER — LORAZEPAM 2 MG/ML IJ SOLN: 1.0000 mg | INTRAMUSCULAR | Status: AC | PRN

## 2019-04-29 MED ORDER — PANTOPRAZOLE SODIUM 40 MG PO TBEC
40.0000 mg | DELAYED_RELEASE_TABLET | Freq: Every day | ORAL | Status: DC
  Administered 2019-04-29 – 2019-05-07 (×9): 40 mg via ORAL
  Filled 2019-04-29 (×9): qty 1

## 2019-04-29 MED ORDER — ADULT MULTIVITAMIN W/MINERALS CH
1.0000 | ORAL_TABLET | Freq: Every day | ORAL | Status: DC
  Administered 2019-05-01 – 2019-05-07 (×7): 1 via ORAL
  Filled 2019-04-29 (×8): qty 1

## 2019-04-29 MED ORDER — SODIUM CHLORIDE 0.9% FLUSH
3.0000 mL | Freq: Two times a day (BID) | INTRAVENOUS | Status: DC
  Administered 2019-04-29 – 2019-05-07 (×12): 3 mL via INTRAVENOUS

## 2019-04-29 MED ORDER — CYCLOBENZAPRINE HCL 5 MG PO TABS
5.0000 mg | ORAL_TABLET | Freq: Four times a day (QID) | ORAL | Status: DC | PRN
  Administered 2019-04-29 (×2): 5 mg via ORAL
  Filled 2019-04-29 (×2): qty 1

## 2019-04-29 MED ORDER — ROSUVASTATIN CALCIUM 10 MG PO TABS
10.0000 mg | ORAL_TABLET | Freq: Every day | ORAL | Status: DC
  Administered 2019-04-29 – 2019-05-06 (×8): 10 mg via ORAL
  Filled 2019-04-29 (×8): qty 1

## 2019-04-29 MED ORDER — THIAMINE HCL 100 MG PO TABS: 100.0000 mg | ORAL_TABLET | Freq: Every day | ORAL | Status: DC

## 2019-04-29 MED ORDER — LORAZEPAM 1 MG PO TABS: 1.0000 mg | ORAL_TABLET | ORAL | Status: AC | PRN

## 2019-04-29 MED ORDER — ENSURE PRE-SURGERY PO LIQD
296.0000 mL | Freq: Once | ORAL | Status: AC
  Administered 2019-04-29: 296 mL via ORAL
  Filled 2019-04-29: qty 296

## 2019-04-29 MED ORDER — SODIUM CHLORIDE 0.9 % IV SOLN: INTRAVENOUS | Status: DC

## 2019-04-29 MED ORDER — CARVEDILOL 3.125 MG PO TABS
3.1250 mg | ORAL_TABLET | Freq: Two times a day (BID) | ORAL | Status: DC
  Administered 2019-04-29 – 2019-05-07 (×16): 3.125 mg via ORAL
  Filled 2019-04-29 (×18): qty 1

## 2019-04-29 MED ORDER — HYDROCODONE-ACETAMINOPHEN 5-325 MG PO TABS
1.0000 | ORAL_TABLET | Freq: Four times a day (QID) | ORAL | Status: DC | PRN
  Administered 2019-04-29 (×3): 2 via ORAL
  Filled 2019-04-29 (×3): qty 2

## 2019-04-29 MED ORDER — HYDROCODONE-ACETAMINOPHEN 5-325 MG PO TABS
1.0000 | ORAL_TABLET | ORAL | Status: DC | PRN
  Administered 2019-04-29: 2 via ORAL
  Filled 2019-04-29: qty 2

## 2019-04-29 MED ORDER — THIAMINE HCL 100 MG/ML IJ SOLN
100.0000 mg | Freq: Every day | INTRAMUSCULAR | Status: DC
  Filled 2019-04-29 (×9): qty 1

## 2019-04-29 MED ORDER — SODIUM CHLORIDE 0.9 % IV SOLN: 250.0000 mL | INTRAVENOUS | Status: DC | PRN

## 2019-04-29 MED ORDER — CHLORDIAZEPOXIDE HCL 25 MG PO CAPS
25.0000 mg | ORAL_CAPSULE | Freq: Every day | ORAL | Status: DC
  Administered 2019-05-02 – 2019-05-07 (×6): 25 mg via ORAL
  Filled 2019-04-29 (×7): qty 1

## 2019-04-29 MED ORDER — MORPHINE SULFATE (PF) 2 MG/ML IV SOLN
0.5000 mg | INTRAVENOUS | Status: DC | PRN
  Administered 2019-04-30: 1 mg via INTRAVENOUS
  Administered 2019-04-30: 08:00:00 0.5 mg via INTRAVENOUS
  Filled 2019-04-29 (×2): qty 1

## 2019-04-29 MED ORDER — THIAMINE HCL 100 MG PO TABS
100.0000 mg | ORAL_TABLET | Freq: Every day | ORAL | Status: DC
  Administered 2019-04-29 – 2019-05-07 (×9): 100 mg via ORAL
  Filled 2019-04-29 (×9): qty 1

## 2019-04-29 MED ORDER — CHLORDIAZEPOXIDE HCL 25 MG PO CAPS
25.0000 mg | ORAL_CAPSULE | Freq: Three times a day (TID) | ORAL | Status: AC
  Administered 2019-04-29 – 2019-04-30 (×3): 25 mg via ORAL
  Filled 2019-04-29 (×3): qty 1

## 2019-04-29 MED ORDER — SACUBITRIL-VALSARTAN 24-26 MG PO TABS
1.0000 | ORAL_TABLET | Freq: Two times a day (BID) | ORAL | Status: DC
  Administered 2019-04-29 – 2019-04-30 (×3): 1 via ORAL
  Filled 2019-04-29 (×4): qty 1

## 2019-04-29 MED ORDER — FOLIC ACID 1 MG PO TABS
1.0000 mg | ORAL_TABLET | Freq: Every day | ORAL | Status: DC
  Administered 2019-04-29 – 2019-05-07 (×9): 1 mg via ORAL
  Filled 2019-04-29 (×9): qty 1

## 2019-04-29 MED ORDER — CHLORDIAZEPOXIDE HCL 25 MG PO CAPS
25.0000 mg | ORAL_CAPSULE | Freq: Two times a day (BID) | ORAL | Status: AC
  Administered 2019-04-30 – 2019-05-01 (×3): 25 mg via ORAL
  Filled 2019-04-29 (×3): qty 1

## 2019-04-29 MED ORDER — ALBUTEROL SULFATE HFA 108 (90 BASE) MCG/ACT IN AERS: 2.0000 | INHALATION_SPRAY | RESPIRATORY_TRACT | Status: DC | PRN

## 2019-04-29 MED ORDER — ENSURE ENLIVE PO LIQD
237.0000 mL | Freq: Three times a day (TID) | ORAL | Status: DC
  Administered 2019-04-29 – 2019-05-07 (×18): 237 mL via ORAL

## 2019-04-29 MED ORDER — HYDROCODONE-ACETAMINOPHEN 5-325 MG PO TABS: 1.0000 | ORAL_TABLET | ORAL | Status: DC | PRN

## 2019-04-29 MED ORDER — FOLIC ACID 1 MG PO TABS: 1.0000 mg | ORAL_TABLET | Freq: Every day | ORAL | Status: DC

## 2019-04-29 MED ORDER — MORPHINE SULFATE (PF) 2 MG/ML IV SOLN
0.5000 mg | INTRAVENOUS | Status: DC | PRN
  Administered 2019-04-29 (×4): 0.5 mg via INTRAVENOUS
  Filled 2019-04-29 (×4): qty 1

## 2019-04-29 MED ORDER — GABAPENTIN 300 MG PO CAPS
300.0000 mg | ORAL_CAPSULE | Freq: Three times a day (TID) | ORAL | Status: DC
  Administered 2019-04-29 – 2019-05-07 (×25): 300 mg via ORAL
  Filled 2019-04-29 (×25): qty 1

## 2019-04-29 MED ORDER — SODIUM CHLORIDE 0.9% FLUSH: 3.0000 mL | INTRAVENOUS | Status: DC | PRN

## 2019-04-29 NOTE — H&P (Signed)
History and Physical    Trevor Williams [id number] DOB: 1960-03-30 DOA: 04/28/2019  PCP: Elsie Stain, MD    Patient coming from: Patient is homeless    Chief Complaint: Right hip pain head injury  HPI: Trevor Williams is a 59 y.o. male with medical history significant of alcohol abuse, history of DTs and delirium, congestive heart failure systolic dysfunction, nonischemic cardiomyopathy, history of respiratory failure secondary to pneumonia and CHF, status post intubation, came with a chief complaint of pain in the right hip. Patient states he was walking and tripped over a rock landing on his right hip.  He states that he also hit his head lost consciousness.  ED Course:  In the emergency room CT scan of the head and neck negative CT abdomen and pelvis positive for intertrochanteric fracture right hip Alcohol level 330 Platelets 80,000 Review of Systems: As per HPI otherwise 10 point review of systems negative.  With exception of right hip  Past Medical History:  Diagnosis Date  . Abnormal liver function tests   . Acute respiratory failure ([place])    a. 07/2018 requiring intubation - CAP/CHF.  Marland Kitchen Alcohol abuse   . Biventricular heart failure ([place])   . Cancer ([place])    skin (left hand)  . Delirium    a. h/o delirium while admitted  . Emphysema of lung ([place])   . Homelessness   . Macrocytic anemia   . Mild CAD    a. cath 07/2018 60% ostial ramus otherwise OK.  . Mitral regurgitation   . NICM (nonischemic cardiomyopathy) ([place])   . Protein calorie malnutrition ([place])   . Tricuspid regurgitation     Past Surgical History:  Procedure Laterality Date  . BIOPSY  01/28/2019   Procedure: BIOPSY;  Surgeon: Otis Brace, MD;  Location: [facility];  Service: Gastroenterology;;  . BIOPSY  01/30/2019   Procedure: BIOPSY;  Surgeon: Otis Brace, MD;  Location: [facility];  Service: Gastroenterology;;  . COLONOSCOPY WITH PROPOFOL N/A 01/30/2019   Procedure: COLONOSCOPY  WITH PROPOFOL;  Surgeon: Otis Brace, MD;  Location: [facility];  Service: Gastroenterology;  Laterality: N/A;  . ESOPHAGOGASTRODUODENOSCOPY (EGD) WITH PROPOFOL N/A 01/28/2019   Procedure: ESOPHAGOGASTRODUODENOSCOPY (EGD) WITH PROPOFOL;  Surgeon: Otis Brace, MD;  Location: [facility];  Service: Gastroenterology;  Laterality: N/A;  . HEMOSTASIS CLIP PLACEMENT  01/30/2019   Procedure: HEMOSTASIS CLIP PLACEMENT;  Surgeon: Otis Brace, MD;  Location: [facility];  Service: Gastroenterology;;  . POLYPECTOMY  01/30/2019   Procedure: POLYPECTOMY;  Surgeon: Otis Brace, MD;  Location: [facility];  Service: Gastroenterology;;  . RIGHT/LEFT HEART CATH AND CORONARY ANGIOGRAPHY N/A 08/09/2018   Procedure: RIGHT/LEFT HEART CATH AND CORONARY ANGIOGRAPHY;  Surgeon: Larey Dresser, MD;  Location: [facility];  Service: Cardiovascular;  Laterality: N/A;     reports that he has been smoking cigarettes. He has been smoking about 1.00 pack per day. He uses smokeless tobacco. He reports current alcohol use. He reports that he does not use drugs.  Allergies  Allergen Reactions  . Doxycycline Rash    Rash noted after administration of vancomycin, doxycycline, and ceftriaxone. Unclear cause of rash.  . Ibuprofen Rash  . Rocephin [Ceftriaxone] Rash    Rash noted after administration of vancomycin, doxycycline, and ceftriaxone. Unclear cause of rash.  . Tylenol [Acetaminophen] Rash  . Vancomycin Rash    Rash noted after administration of vancomycin, doxycycline, and ceftriaxone. Unclear cause of rash.    Family History  Problem Relation Age of Onset  .  Hypertension Maternal Grandfather      Prior to Admission medications   Medication Sig Start Date End Date Taking? Authorizing Provider  albuterol (VENTOLIN HFA) 108 (90 Base) MCG/ACT inhaler Inhale 2 puffs into the lungs every 4 (four) hours as needed for wheezing or shortness of breath. 03/20/19  Yes Elsie Stain,  MD  carvedilol (COREG) 3.125 MG tablet Take 1 tablet (3.125 mg total) by mouth 2 (two) times daily with a meal. 04/23/19  Yes Elsie Stain, MD  feeding supplement, ENSURE ENLIVE, (ENSURE ENLIVE) LIQD Take 237 mLs by mouth 3 (three) times daily between meals. 02/01/19  Yes Elgergawy, Silver Huguenin, MD  folic acid (FOLVITE) 1 MG tablet Take 1 tablet (1 mg total) by mouth daily. 03/20/19  Yes Elsie Stain, MD  gabapentin (NEURONTIN) 300 MG capsule Take 1 capsule (300 mg total) by mouth 3 (three) times daily. 04/23/19  Yes Elsie Stain, MD  rosuvastatin (CRESTOR) 10 MG tablet Take 1 tablet (10 mg total) by mouth daily at 6 PM. 03/20/19  Yes Elsie Stain, MD  sacubitril-valsartan (ENTRESTO) 24-26 MG Take 1 tablet by mouth 2 (two) times daily. 03/20/19  Yes Elsie Stain, MD  thiamine 100 MG tablet Take 1 tablet (100 mg total) by mouth daily. 03/20/19  Yes Elsie Stain, MD  pantoprazole (PROTONIX) 40 MG tablet Take 1 tablet (40 mg total) by mouth daily. 03/20/19 04/19/19  Elsie Stain, MD    Physical Exam: Vitals:   04/28/19 1909 04/28/19 1930 04/28/19 2221  BP:  129/88 95/66  Pulse:  89 81  Resp:  15 11  Temp:  98.3 F (36.8 C)   TempSrc:  Oral   SpO2: 96% 97% 96%  Weight:  59 kg     Constitutional: NAD, calm, comfortable Vitals:   04/28/19 1909 04/28/19 1930 04/28/19 2221  BP:  129/88 95/66  Pulse:  89 81  Resp:  15 11  Temp:  98.3 F (36.8 C)   TempSrc:  Oral   SpO2: 96% 97% 96%  Weight:  59 kg    Eyes: PERRL, lids and conjunctivae normal ENMT: Mucous membranes are moist. Posterior pharynx clear of any exudate or lesions.Normal dentition.  Neck: normal, supple, no masses, no thyromegaly Respiratory: clear to auscultation bilaterally, no wheezing, no crackles. Normal respiratory effort. No accessory muscle use.  Cardiovascular: Regular rate and rhythm, no murmurs / rubs / gallops. No extremity edema. 2+ pedal pulses. No carotid bruits.  Abdomen: no  tenderness, no masses palpated. No hepatosplenomegaly. Bowel sounds positive.  Musculoskeletal: Right hip tenderness.  Skin: no rashes, lesions, ulcers. No induration Neurologic: CN 2-12 grossly intact. Sensation intact, DTR normal. Strength 5/5 in all 4.  Psychiatric: Normal judgment and insight. Alert and oriented x 3. Normal mood.    Labs on Admission: I have personally reviewed following labs and imaging studies  CBC: Recent Labs  Lab 04/28/19 1934  WBC 4.9  NEUTROABS 2.3  HGB 15.2  HCT 46.4  MCV 108.7*  PLT 80*   Basic Metabolic Panel: Recent Labs  Lab 04/28/19 2056  NA 142  K 4.0  CL 105  CO2 27  GLUCOSE 90  BUN 7  CREATININE 0.67  CALCIUM 9.1   GFR: Estimated Creatinine Clearance: 85 mL/min (by C-G formula based on SCr of 0.67 mg/dL). Liver Function Tests: Recent Labs  Lab 04/28/19 2056  AST 65*  ALT 31  ALKPHOS 99  BILITOT 0.6  PROT 7.8  ALBUMIN 3.9   No  results for input(s): LIPASE, AMYLASE in the last 168 hours. No results for input(s): AMMONIA in the last 168 hours. Coagulation Profile: No results for input(s): INR, PROTIME in the last 168 hours. Cardiac Enzymes: No results for input(s): CKTOTAL, CKMB, CKMBINDEX, TROPONINI in the last 168 hours. BNP (last 3 results) No results for input(s): PROBNP in the last 8760 hours. HbA1C: No results for input(s): HGBA1C in the last 72 hours. CBG: No results for input(s): GLUCAP in the last 168 hours. Lipid Profile: No results for input(s): CHOL, HDL, LDLCALC, TRIG, CHOLHDL, LDLDIRECT in the last 72 hours. Thyroid Function Tests: No results for input(s): TSH, T4TOTAL, FREET4, T3FREE, THYROIDAB in the last 72 hours. Anemia Panel: No results for input(s): VITAMINB12, FOLATE, FERRITIN, TIBC, IRON, RETICCTPCT in the last 72 hours. Urine analysis:    Component Value Date/Time   COLORURINE AMBER (A) 01/26/2019 1535   APPEARANCEUR CLEAR 01/26/2019 1535   LABSPEC 1.016 01/26/2019 1535   PHURINE 6.0  01/26/2019 1535   GLUCOSEU NEGATIVE 01/26/2019 1535   HGBUR NEGATIVE 01/26/2019 1535   BILIRUBINUR SMALL (A) 01/26/2019 1535   KETONESUR NEGATIVE 01/26/2019 1535   PROTEINUR 100 (A) 01/26/2019 1535   NITRITE NEGATIVE 01/26/2019 1535   LEUKOCYTESUR NEGATIVE 01/26/2019 1535    Radiological Exams on Admission: CT Head Wo Contrast  Result Date: 04/28/2019 CLINICAL DATA:  59 year old male with fall. EXAM: CT HEAD WITHOUT CONTRAST CT CERVICAL SPINE WITHOUT CONTRAST TECHNIQUE: Multidetector CT imaging of the head and cervical spine was performed following the standard protocol without intravenous contrast. Multiplanar CT image reconstructions of the cervical spine were also generated. COMPARISON:  Head CT dated 03/23/2019. 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 There is otherwise background of age-related atrophy and chronic microvascular ischemic changes. Incidental note of a cavum septum pellucidum and cavum vergae. There is no acute intracranial hemorrhage. No mass effect or midline shift. No extra-axial fluid collection. Vascular: No hyperdense vessel or unexpected calcification. Skull: Normal. Negative for fracture or focal lesion. Sinuses/Orbits: No acute finding. Other: None CT CERVICAL SPINE FINDINGS Alignment: No acute subluxation. Skull base and vertebrae: No acute fracture. Soft tissues and spinal canal: No prevertebral fluid or swelling. No visible canal hematoma. Disc levels: Multilevel degenerative changes with endplate irregularity and disc space narrowing and spurring. Upper chest: Large right apical subpleural blebs. Other: None IMPRESSION: 1. No acute intracranial hemorrhage. 2. Age-related atrophy and chronic microvascular ischemic changes. Old right MCA territory infarct. 3. No acute/traumatic cervical spine pathology. Multilevel degenerative changes. Electronically Signed   By: Anner Crete M.D.   On: 04/28/2019 22:22   CT  Cervical Spine Wo Contrast  Result Date: 04/28/2019 CLINICAL DATA:  59 year old male with fall. EXAM: CT HEAD WITHOUT CONTRAST CT CERVICAL SPINE WITHOUT CONTRAST TECHNIQUE: Multidetector CT imaging of the head and cervical spine was performed following the standard protocol without intravenous contrast. Multiplanar CT image reconstructions of the cervical spine were also generated. COMPARISON:  Head CT dated 03/23/2019. 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 There is otherwise background of age-related atrophy and chronic microvascular ischemic changes. Incidental note of a cavum septum pellucidum and cavum vergae. There is no acute intracranial hemorrhage. No mass effect or midline shift. No extra-axial fluid collection. Vascular: No hyperdense vessel or unexpected calcification. Skull: Normal. Negative for fracture or focal lesion. Sinuses/Orbits: No acute finding. Other: None CT CERVICAL SPINE FINDINGS Alignment: No acute subluxation. Skull base and vertebrae: No acute fracture. Soft  tissues and spinal canal: No prevertebral fluid or swelling. No visible canal hematoma. Disc levels: Multilevel degenerative changes with endplate irregularity and disc space narrowing and spurring. Upper chest: Large right apical subpleural blebs. Other: None IMPRESSION: 1. No acute intracranial hemorrhage. 2. Age-related atrophy and chronic microvascular ischemic changes. Old right MCA territory infarct. 3. No acute/traumatic cervical spine pathology. Multilevel degenerative changes. Electronically Signed   By: Anner Crete M.D.   On: 04/28/2019 22:22   CT PELVIS WO CONTRAST  Result Date: 04/28/2019 CLINICAL DATA:  Right hip pain after injury EXAM: CT PELVIS WITHOUT CONTRAST TECHNIQUE: Multidetector CT imaging of the pelvis was performed following the standard protocol without intravenous contrast. COMPARISON:  Radiograph February 20, 2019 FINDINGS: Urinary Tract: The  visualized distal ureters and bladder appear unremarkable. Bowel: No bowel wall thickening, distention or surrounding inflammation identified within the pelvis. Scattered colonic diverticula are noted without diverticulitis. Vascular/Lymphatic: No enlarged pelvic lymph nodes identified. Scattered aortic atherosclerotic calcifications are seen without aneurysmal dilatation. Reproductive:  The prostate is unremarkable. Other: Mild soft tissue swelling seen over the right gluteal musculature. No soft tissue hematoma seen. Musculoskeletal: There is a healed fracture deformity seen at the anterior right iliac wing with small heterotopic ossification. There is a nondisplaced right intratrochanteric fracture extending through the superior portion of the greater trochanter. The femoral head is still well seated within the acetabulum. There is moderate bilateral hip osteoarthritis. Degenerative changes seen in the lower lumbar spine. There is heterogeneous subtle enlargement of the posterior right gluteal musculature, likely intramuscular edema. IMPRESSION: Acute nondisplaced right intratrochanteric fracture extending through the superior posterior greater trochanter. Right gluteal musculature edema and overlying soft tissue swelling. Prior healed fracture deformity of the anterior right iliac wing with heterotopic ossification. Electronically Signed   By: Prudencio Pair M.D.   On: 04/28/2019 22:23   DG Hips Bilat W or Wo Pelvis 3-4 Views  Result Date: 04/28/2019 CLINICAL DATA:  [place] from standing, right hip pain EXAM: DG HIP (WITH OR WITHOUT PELVIS) 3-4V BILAT COMPARISON:  02/20/2019 FINDINGS: Frontal view of the pelvis as well as frontal and frogleg lateral views of the right hip are obtained. Cassette artifact limits evaluation. There are numerous skin folds overlying the right hip. However, there is a subtle lucency perpendicular to the trabecular markings in the inter trochanteric region of the central aspect of the  proximal right femur, seen on both the frontal and frogleg lateral views. I cannot exclude a nondisplaced fracture, and CT or MRI may be useful for further evaluation. No other acute displaced fractures. The hips are well aligned. There remainder of the bony pelvis is normal. IMPRESSION: 1. Possible nondisplaced intertrochanteric right hip fracture as above. CT or MRI recommended for further evaluation. Electronically Signed   By: Randa Ngo M.D.   On: 04/28/2019 20:16    EKG: Independently reviewed. Pending  Assessment and plan  Right hip intertrochanteric fracture Plan for surgery protocol Plan to do surgery Wednesday  Alcohol intoxication Patient with history of DTs Alcohol level 331 Withdrawal alcohol protocol, thiamine, folic acid, IV fluids, Ativan per withdrawal  protocol  Thrombocytopenia Platelets 80,000 Plan follow platelets no prophylaxis Lovenox  Chronic bronchitis Resume inhaler  History of congestive heart failure with systolic dysfunction Resume Coreg Entresto  Patient is homeless  Assessment/Plan Active Problems:   Homeless   Simple chronic bronchitis ([facility])   Tobacco use disorder   Chronic systolic heart failure ([facility])   Alcoholic intoxication without complication ([facility])   Hip fracture ([place])  DVT prophylaxis: SCD  Code Status: Full code Family Communication: Tried in the morning Disposition Plan: Rehab Consults called: By emergency room physician Admission status: Full code   Linard Millers Roslind Michaux MD [facility]  If 7PM-7AM, please contact night-coverage [email or web address]   04/29/2019, 12:10 AM

## 2019-04-29 NOTE — ED Notes (Signed)
ED TO INPATIENT HANDOFF REPORT  Name/Age/Gender Trevor Williams 59 y.o. male  Code Status Code Status History    Date Active Date Inactive Code Status Order ID Comments User Context   01/26/2019 2009 02/02/2019 1342 Full Code [id number]  Trevor Organ, DO ED   07/29/2018 0115 08/12/2018 1700 Full Code [id number]  Trevor Patience, MD ED   Advance Care Planning Activity      Home/SNF/Other Home  Chief Complaint Hip fracture [facility]) [S72.009A]  Level of Care/Admitting Diagnosis ED Disposition    ED Disposition Condition [facility] Hospital Area: [facility] [[id number]]  Level of Care: Med-Surg [16]  May admit patient to [facility] or [facility] if equivalent level of care is available:: Yes  Covid Evaluation: Asymptomatic Screening Protocol (No Symptoms)  [facility]: [id number]  Admitting Physician: Trevor [facility] [id number]  Attending Physician: Trevor Williams [id number]  Estimated length of stay: 3 - 4 days  Certification:: I certify this patient will need inpatient services for at least 2 midnights       Medical History Past Medical History:  Diagnosis Date  . Abnormal liver function tests   . Acute respiratory failure ([place])    a. 07/2018 requiring intubation - CAP/CHF.  Marland Kitchen Alcohol abuse   . Biventricular heart failure ([place])   . Cancer ([place])    skin (left hand)  . Delirium    a. h/o delirium while admitted  . Emphysema of lung ([place])   . Homelessness   . Macrocytic anemia   . Mild CAD    a. cath 07/2018 60% ostial ramus otherwise OK.  . Mitral regurgitation   . NICM (nonischemic cardiomyopathy) ([place])   . Protein calorie malnutrition ([place])   . Tricuspid regurgitation     Allergies Allergies  Allergen Reactions  . Doxycycline Rash    Rash noted after administration of vancomycin, doxycycline, and ceftriaxone. Unclear cause of rash.  . Ibuprofen Rash  . Rocephin [Ceftriaxone] Rash    Rash noted after  administration of vancomycin, doxycycline, and ceftriaxone. Unclear cause of rash.  . Tylenol [Acetaminophen] Rash  . Vancomycin Rash    Rash noted after administration of vancomycin, doxycycline, and ceftriaxone. Unclear cause of rash.    IV Location/Drains/Wounds Patient Lines/Drains/Airways Status   Active Line/Drains/Airways    Name:   Placement date:   Placement time:   Site:   Days:   Peripheral IV 04/28/19 Left Forearm   04/28/19    2105    Forearm   1          Labs/Imaging Results for orders placed or performed during the hospital encounter of 04/28/19 (from the past 48 hour(s))  CBC with Differential     Status: Abnormal   Collection Time: 04/28/19  7:34 PM  Result Value Ref Range   WBC 4.9 4.0 - 10.5 K/uL   RBC 4.27 4.22 - 5.81 MIL/uL   Hemoglobin 15.2 13.0 - 17.0 g/dL   HCT 46.4 39.0 - 52.0 %   MCV 108.7 (H) 80.0 - 100.0 fL   MCH 35.6 (H) 26.0 - 34.0 pg   MCHC 32.8 30.0 - 36.0 g/dL   RDW 12.9 11.5 - 15.5 %   Platelets 80 (L) 150 - 400 K/uL    Comment: REPEATED TO VERIFY PLATELET COUNT CONFIRMED BY SMEAR SPECIMEN CHECKED FOR CLOTS Immature Platelet Fraction may be clinically indicated, consider ordering this additional test [id number]    nRBC 0.0 0.0 - 0.2 %  Neutrophils Relative % 46 %   Neutro Abs 2.3 1.7 - 7.7 K/uL   Lymphocytes Relative 42 %   Lymphs Abs 2.1 0.7 - 4.0 K/uL   Monocytes Relative 8 %   Monocytes Absolute 0.4 0.1 - 1.0 K/uL   Eosinophils Relative 3 %   Eosinophils Absolute 0.2 0.0 - 0.5 K/uL   Basophils Relative 1 %   Basophils Absolute 0.0 0.0 - 0.1 K/uL   Immature Granulocytes 0 %   Abs Immature Granulocytes 0.02 0.00 - 0.07 K/uL    Comment: Performed at [facility], [place] [address] [place], [place] [address]  Comprehensive metabolic panel     Status: Abnormal   Collection Time: 04/28/19  8:56 PM  Result Value Ref Range   Sodium 142 135 - 145 mmol/L   Potassium 4.0 3.5 - 5.1 mmol/L   Chloride 105 98 - 111 mmol/L    CO2 27 22 - 32 mmol/L   Glucose, Bld 90 70 - 99 mg/dL   BUN 7 6 - 20 mg/dL   Creatinine, Ser 0.67 0.61 - 1.24 mg/dL   Calcium 9.1 8.9 - 10.3 mg/dL   Total Protein 7.8 6.5 - 8.1 g/dL   Albumin 3.9 3.5 - 5.0 g/dL   AST 65 (H) 15 - 41 U/L   ALT 31 0 - 44 U/L   Alkaline Phosphatase 99 38 - 126 U/L   Total Bilirubin 0.6 0.3 - 1.2 mg/dL   GFR calc non Af Amer >60 >60 mL/min   GFR calc Af Amer >60 >60 mL/min   Anion gap 10 5 - 15    Comment: Performed at [facility], [place] [address], [place], [place] [address]  Ethanol     Status: Abnormal   Collection Time: 04/28/19  9:04 PM  Result Value Ref Range   Alcohol, Ethyl (B) 331 (HH) <10 mg/dL    Comment: CRITICAL RESULT CALLED TO, READ BACK BY AND VERIFIED WITH: Trevor Williams @ 2150 ON 04/28/19 Trevor Williams (NOTE) Lowest detectable limit for serum alcohol is 10 mg/dL. For medical purposes only. Performed at [facility], [place] [address], [place], [place] [address]    CT Head Wo Contrast  Result Date: 04/28/2019 CLINICAL DATA:  59 year old male with fall. EXAM: CT HEAD WITHOUT CONTRAST CT CERVICAL SPINE WITHOUT CONTRAST TECHNIQUE: Multidetector CT imaging of the head and cervical spine was performed following the standard protocol without intravenous contrast. Multiplanar CT image reconstructions of the cervical spine were also generated. COMPARISON:  Head CT dated 03/23/2019. 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 There is otherwise background of age-related atrophy and chronic microvascular ischemic changes. Incidental note of a cavum septum pellucidum and cavum vergae. There is no acute intracranial hemorrhage. No mass effect or midline shift. No extra-axial fluid collection. Vascular: No hyperdense vessel or unexpected calcification. Skull: Normal. Negative for fracture or focal lesion. Sinuses/Orbits: No acute finding. Other: None CT CERVICAL SPINE FINDINGS  Alignment: No acute subluxation. Skull base and vertebrae: No acute fracture. Soft tissues and spinal canal: No prevertebral fluid or swelling. No visible canal hematoma. Disc levels: Multilevel degenerative changes with endplate irregularity and disc space narrowing and spurring. Upper chest: Large right apical subpleural blebs. Other: None IMPRESSION: 1. No acute intracranial hemorrhage. 2. Age-related atrophy and chronic microvascular ischemic changes. Old right MCA territory infarct. 3. No acute/traumatic cervical spine pathology. Multilevel degenerative changes. Electronically Signed   By: Laren Everts.D.  On: 04/28/2019 22:22   CT Cervical Spine Wo Contrast  Result Date: 04/28/2019 CLINICAL DATA:  59 year old male with fall. EXAM: CT HEAD WITHOUT CONTRAST CT CERVICAL SPINE WITHOUT CONTRAST TECHNIQUE: Multidetector CT imaging of the head and cervical spine was performed following the standard protocol without intravenous contrast. Multiplanar CT image reconstructions of the cervical spine were also generated. COMPARISON:  Head CT dated 03/23/2019. 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 There is otherwise background of age-related atrophy and chronic microvascular ischemic changes. Incidental note of a cavum septum pellucidum and cavum vergae. There is no acute intracranial hemorrhage. No mass effect or midline shift. No extra-axial fluid collection. Vascular: No hyperdense vessel or unexpected calcification. Skull: Normal. Negative for fracture or focal lesion. Sinuses/Orbits: No acute finding. Other: None CT CERVICAL SPINE FINDINGS Alignment: No acute subluxation. Skull base and vertebrae: No acute fracture. Soft tissues and spinal canal: No prevertebral fluid or swelling. No visible canal hematoma. Disc levels: Multilevel degenerative changes with endplate irregularity and disc space narrowing and spurring. Upper chest: Large right apical  subpleural blebs. Other: None IMPRESSION: 1. No acute intracranial hemorrhage. 2. Age-related atrophy and chronic microvascular ischemic changes. Old right MCA territory infarct. 3. No acute/traumatic cervical spine pathology. Multilevel degenerative changes. Electronically Signed   By: Anner Crete M.D.   On: 04/28/2019 22:22   CT PELVIS WO CONTRAST  Result Date: 04/28/2019 CLINICAL DATA:  Right hip pain after injury EXAM: CT PELVIS WITHOUT CONTRAST TECHNIQUE: Multidetector CT imaging of the pelvis was performed following the standard protocol without intravenous contrast. COMPARISON:  Radiograph February 20, 2019 FINDINGS: Urinary Tract: The visualized distal ureters and bladder appear unremarkable. Bowel: No bowel wall thickening, distention or surrounding inflammation identified within the pelvis. Scattered colonic diverticula are noted without diverticulitis. Vascular/Lymphatic: No enlarged pelvic lymph nodes identified. Scattered aortic atherosclerotic calcifications are seen without aneurysmal dilatation. Reproductive:  The prostate is unremarkable. Other: Mild soft tissue swelling seen over the right gluteal musculature. No soft tissue hematoma seen. Musculoskeletal: There is a healed fracture deformity seen at the anterior right iliac wing with small heterotopic ossification. There is a nondisplaced right intratrochanteric fracture extending through the superior portion of the greater trochanter. The femoral head is still well seated within the acetabulum. There is moderate bilateral hip osteoarthritis. Degenerative changes seen in the lower lumbar spine. There is heterogeneous subtle enlargement of the posterior right gluteal musculature, likely intramuscular edema. IMPRESSION: Acute nondisplaced right intratrochanteric fracture extending through the superior posterior greater trochanter. Right gluteal musculature edema and overlying soft tissue swelling. Prior healed fracture deformity of the  anterior right iliac wing with heterotopic ossification. Electronically Signed   By: Prudencio Pair M.D.   On: 04/28/2019 22:23   DG Hips Bilat W or Wo Pelvis 3-4 Views  Result Date: 04/28/2019 CLINICAL DATA:  [place] from standing, right hip pain EXAM: DG HIP (WITH OR WITHOUT PELVIS) 3-4V BILAT COMPARISON:  02/20/2019 FINDINGS: Frontal view of the pelvis as well as frontal and frogleg lateral views of the right hip are obtained. Cassette artifact limits evaluation. There are numerous skin folds overlying the right hip. However, there is a subtle lucency perpendicular to the trabecular markings in the inter trochanteric region of the central aspect of the proximal right femur, seen on both the frontal and frogleg lateral views. I cannot exclude a nondisplaced fracture, and CT or MRI may be useful for further evaluation. No other acute displaced fractures. The hips are well aligned. There  remainder of the bony pelvis is normal. IMPRESSION: 1. Possible nondisplaced intertrochanteric right hip fracture as above. CT or MRI recommended for further evaluation. Electronically Signed   By: Randa Ngo M.D.   On: 04/28/2019 20:16    Pending Labs Unresulted Labs (From admission, onward)    Start     Ordered   04/28/19 2257  SARS CORONAVIRUS 2 (TAT 6-24 HRS) Nasopharyngeal Nasopharyngeal Swab  (Tier 3 (TAT 6-24 hrs))  Once,   STAT    Question Answer Comment  Is this test for diagnosis or screening Screening   Symptomatic for COVID-19 as defined by CDC No   Hospitalized for COVID-19 No   Admitted to ICU for COVID-19 No   Previously tested for COVID-19 Yes   Resident in a congregate (group) care setting No   Employed in healthcare setting No      04/28/19 2256   Signed and Held  CBC  Tomorrow morning,   R     Signed and Held   Signed and Held  Basic metabolic panel  Tomorrow morning,   R     Signed and Held   Signed and Held  Type and screen [place]  Once,   R    Comments: [place]    Signed and Held   Signed and Held  Urinalysis, Complete w Microscopic  Once,   R     Signed and Held   Signed and Held  Urine culture  Once,   R     Signed and Held   Signed and Held  Magnesium  Add-on,   R     Signed and Held   Signed and Held  Phosphorus  Add-on,   R     Signed and Held          Vitals/Pain Today's Vitals   04/28/19 1914 04/28/19 1930 04/28/19 2221 04/28/19 2316  BP:  129/88 95/66   Pulse:  89 81   Resp:  15 11   Temp:  98.3 F (36.8 Trevor)    TempSrc:  Oral    SpO2:  97% 96%   Weight:  59 kg    PainSc: 8    8     Isolation Precautions No active isolations  Medications Medications  thiamine (B-1) injection 100 mg (has no administration in time range)  fentaNYL (SUBLIMAZE) injection 50 mcg (50 mcg Intravenous Given 04/28/19 2109)  ondansetron (ZOFRAN) injection 4 mg (4 mg Intravenous Given 04/28/19 2109)  sodium chloride 0.9 % bolus 1,000 mL (1,000 mLs Intravenous New Bag/Given 04/28/19 2355)  ondansetron (ZOFRAN) injection 4 mg (4 mg Intravenous Given 04/28/19 2358)  morphine 4 MG/ML injection 4 mg (4 mg Intravenous Given 04/28/19 2358)    Mobility walks

## 2019-04-29 NOTE — Consult Note (Signed)
ORTHOPAEDIC CONSULTATION  REQUESTING PHYSICIAN: Antonieta Pert, MD  PCP:  Elsie Stain, MD  Chief Complaint: Right hip pain  HPI: Trevor Williams is a 59 y.o. male who presented to [facility] ED on 04/28/19 with complaint of right hip pain, secondary to a fall from standing height. He did report also hitting his head, and losing consciousness. In the ED, CT head/neck revealed no acute injuries. X-ray pelvis revealed possible nondisplaced intertrochanteric right hip fracture, which was confirmed with CT pelvis. Dr. Alvan Dame was consulted for orthopaedic management.   Today, on exam he is resting in bed with minimal discomfort. He reports that he resides at [facility]. He does use a cane lately for ambulation, as he reports a left leg fracture within the past few months. Per chart review, he does have a history of alcohol abuse with DTs and delirium, and has recently reported drinking 6 beers daily.   Past Medical History:  Diagnosis Date  . Abnormal liver function tests   . Acute respiratory failure ([place])    a. 07/2018 requiring intubation - CAP/CHF.  Marland Kitchen Alcohol abuse   . Biventricular heart failure ([place])   . Cancer ([place])    skin (left hand)  . Delirium    a. h/o delirium while admitted  . Emphysema of lung ([place])   . Homelessness   . Macrocytic anemia   . Mild CAD    a. cath 07/2018 60% ostial ramus otherwise OK.  . Mitral regurgitation   . NICM (nonischemic cardiomyopathy) ([place])   . Protein calorie malnutrition ([place])   . Tricuspid regurgitation    Past Surgical History:  Procedure Laterality Date  . BIOPSY  01/28/2019   Procedure: BIOPSY;  Surgeon: Otis Brace, MD;  Location: [facility];  Service: Gastroenterology;;  . BIOPSY  01/30/2019   Procedure: BIOPSY;  Surgeon: Otis Brace, MD;  Location: [facility];  Service: Gastroenterology;;  . COLONOSCOPY WITH PROPOFOL N/A 01/30/2019   Procedure: COLONOSCOPY WITH PROPOFOL;  Surgeon: Otis Brace, MD;   Location: [facility];  Service: Gastroenterology;  Laterality: N/A;  . ESOPHAGOGASTRODUODENOSCOPY (EGD) WITH PROPOFOL N/A 01/28/2019   Procedure: ESOPHAGOGASTRODUODENOSCOPY (EGD) WITH PROPOFOL;  Surgeon: Otis Brace, MD;  Location: [facility];  Service: Gastroenterology;  Laterality: N/A;  . HEMOSTASIS CLIP PLACEMENT  01/30/2019   Procedure: HEMOSTASIS CLIP PLACEMENT;  Surgeon: Otis Brace, MD;  Location: [facility];  Service: Gastroenterology;;  . POLYPECTOMY  01/30/2019   Procedure: POLYPECTOMY;  Surgeon: Otis Brace, MD;  Location: [facility];  Service: Gastroenterology;;  . RIGHT/LEFT HEART CATH AND CORONARY ANGIOGRAPHY N/A 08/09/2018   Procedure: RIGHT/LEFT HEART CATH AND CORONARY ANGIOGRAPHY;  Surgeon: Larey Dresser, MD;  Location: [facility];  Service: Cardiovascular;  Laterality: N/A;   Social History   Socioeconomic History  . Marital status: Single    Spouse name: Not on file  . Number of children: Not on file  . Years of education: Not on file  . Highest education level: Not on file  Occupational History  . Not on file  Tobacco Use  . Smoking status: Current Every Day Smoker    Packs/day: 1.00    Types: Cigarettes  . Smokeless tobacco: Current User  Substance and Sexual Activity  . Alcohol use: Yes    Comment: Liquor daily - 1/2 of a fifth   . Drug use: No  . Sexual activity: Not on file  Other Topics Concern  . Not on file  Social History Narrative  . Not on  file   Social Determinants of Health   Financial Resource Strain:   . Difficulty of Paying Living Expenses: Not on file  Food Insecurity:   . Worried About Charity fundraiser in the Last Year: Not on file  . Ran Out of Food in the Last Year: Not on file  Transportation Needs:   . Lack of Transportation (Medical): Not on file  . Lack of Transportation (Non-Medical): Not on file  Physical Activity:   . Days of Exercise per Week: Not on file  . Minutes of Exercise per  Session: Not on file  Stress:   . Feeling of Stress : Not on file  Social Connections:   . Frequency of Communication with Friends and Family: Not on file  . Frequency of Social Gatherings with Friends and Family: Not on file  . Attends Religious Services: Not on file  . Active Member of Clubs or Organizations: Not on file  . Attends Archivist Meetings: Not on file  . Marital Status: Not on file   Family History  Problem Relation Age of Onset  . Hypertension Maternal Grandfather    Allergies  Allergen Reactions  . Doxycycline Rash    Rash noted after administration of vancomycin, doxycycline, and ceftriaxone. Unclear cause of rash.  . Ibuprofen Rash  . Rocephin [Ceftriaxone] Rash    Rash noted after administration of vancomycin, doxycycline, and ceftriaxone. Unclear cause of rash.  . Tylenol [Acetaminophen] Rash  . Vancomycin Rash    Rash noted after administration of vancomycin, doxycycline, and ceftriaxone. Unclear cause of rash.   Prior to Admission medications   Medication Sig Start Date End Date Taking? Authorizing Provider  albuterol (VENTOLIN HFA) 108 (90 Base) MCG/ACT inhaler Inhale 2 puffs into the lungs every 4 (four) hours as needed for wheezing or shortness of breath. 03/20/19  Yes Elsie Stain, MD  carvedilol (COREG) 3.125 MG tablet Take 1 tablet (3.125 mg total) by mouth 2 (two) times daily with a meal. 04/23/19  Yes Elsie Stain, MD  feeding supplement, ENSURE ENLIVE, (ENSURE ENLIVE) LIQD Take 237 mLs by mouth 3 (three) times daily between meals. 02/01/19  Yes [place]y, Silver Huguenin, MD  folic acid (FOLVITE) 1 MG tablet Take 1 tablet (1 mg total) by mouth daily. 03/20/19  Yes Elsie Stain, MD  gabapentin (NEURONTIN) 300 MG capsule Take 1 capsule (300 mg total) by mouth 3 (three) times daily. 04/23/19  Yes Elsie Stain, MD  rosuvastatin (CRESTOR) 10 MG tablet Take 1 tablet (10 mg total) by mouth daily at 6 PM. 03/20/19  Yes Elsie Stain, MD    sacubitril-valsartan (ENTRESTO) 24-26 MG Take 1 tablet by mouth 2 (two) times daily. 03/20/19  Yes Elsie Stain, MD  thiamine 100 MG tablet Take 1 tablet (100 mg total) by mouth daily. 03/20/19  Yes Elsie Stain, MD  pantoprazole (PROTONIX) 40 MG tablet Take 1 tablet (40 mg total) by mouth daily. 03/20/19 04/19/19  Elsie Stain, MD   CT Head Wo Contrast  Result Date: 04/28/2019 CLINICAL DATA:  59 year old male with fall. EXAM: CT HEAD WITHOUT CONTRAST CT CERVICAL SPINE WITHOUT CONTRAST TECHNIQUE: Multidetector CT imaging of the head and cervical spine was performed following the standard protocol without intravenous contrast. Multiplanar CT image reconstructions of the cervical spine were also generated. COMPARISON:  Head CT dated 03/23/2019. 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 There is otherwise background of age-related atrophy and chronic  microvascular ischemic changes. Incidental note of a cavum septum pellucidum and cavum vergae. There is no acute intracranial hemorrhage. No mass effect or midline shift. No extra-axial fluid collection. Vascular: No hyperdense vessel or unexpected calcification. Skull: Normal. Negative for fracture or focal lesion. Sinuses/Orbits: No acute finding. Other: None CT CERVICAL SPINE FINDINGS Alignment: No acute subluxation. Skull base and vertebrae: No acute fracture. Soft tissues and spinal canal: No prevertebral fluid or swelling. No visible canal hematoma. Disc levels: Multilevel degenerative changes with endplate irregularity and disc space narrowing and spurring. Upper chest: Large right apical subpleural blebs. Other: None IMPRESSION: 1. No acute intracranial hemorrhage. 2. Age-related atrophy and chronic microvascular ischemic changes. Old right MCA territory infarct. 3. No acute/traumatic cervical spine pathology. Multilevel degenerative changes. Electronically Signed   By: Anner Crete M.D.    On: 04/28/2019 22:22   CT Cervical Spine Wo Contrast  Result Date: 04/28/2019 CLINICAL DATA:  59 year old male with fall. EXAM: CT HEAD WITHOUT CONTRAST CT CERVICAL SPINE WITHOUT CONTRAST TECHNIQUE: Multidetector CT imaging of the head and cervical spine was performed following the standard protocol without intravenous contrast. Multiplanar CT image reconstructions of the cervical spine were also generated. COMPARISON:  Head CT dated 03/23/2019. 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 There is otherwise background of age-related atrophy and chronic microvascular ischemic changes. Incidental note of a cavum septum pellucidum and cavum vergae. There is no acute intracranial hemorrhage. No mass effect or midline shift. No extra-axial fluid collection. Vascular: No hyperdense vessel or unexpected calcification. Skull: Normal. Negative for fracture or focal lesion. Sinuses/Orbits: No acute finding. Other: None CT CERVICAL SPINE FINDINGS Alignment: No acute subluxation. Skull base and vertebrae: No acute fracture. Soft tissues and spinal canal: No prevertebral fluid or swelling. No visible canal hematoma. Disc levels: Multilevel degenerative changes with endplate irregularity and disc space narrowing and spurring. Upper chest: Large right apical subpleural blebs. Other: None IMPRESSION: 1. No acute intracranial hemorrhage. 2. Age-related atrophy and chronic microvascular ischemic changes. Old right MCA territory infarct. 3. No acute/traumatic cervical spine pathology. Multilevel degenerative changes. Electronically Signed   By: Anner Crete M.D.   On: 04/28/2019 22:22   CT PELVIS WO CONTRAST  Result Date: 04/28/2019 CLINICAL DATA:  Right hip pain after injury EXAM: CT PELVIS WITHOUT CONTRAST TECHNIQUE: Multidetector CT imaging of the pelvis was performed following the standard protocol without intravenous contrast. COMPARISON:  Radiograph February 20, 2019  FINDINGS: Urinary Tract: The visualized distal ureters and bladder appear unremarkable. Bowel: No bowel wall thickening, distention or surrounding inflammation identified within the pelvis. Scattered colonic diverticula are noted without diverticulitis. Vascular/Lymphatic: No enlarged pelvic lymph nodes identified. Scattered aortic atherosclerotic calcifications are seen without aneurysmal dilatation. Reproductive:  The prostate is unremarkable. Other: Mild soft tissue swelling seen over the right gluteal musculature. No soft tissue hematoma seen. Musculoskeletal: There is a healed fracture deformity seen at the anterior right iliac wing with small heterotopic ossification. There is a nondisplaced right intratrochanteric fracture extending through the superior portion of the greater trochanter. The femoral head is still well seated within the acetabulum. There is moderate bilateral hip osteoarthritis. Degenerative changes seen in the lower lumbar spine. There is heterogeneous subtle enlargement of the posterior right gluteal musculature, likely intramuscular edema. IMPRESSION: Acute nondisplaced right intratrochanteric fracture extending through the superior posterior greater trochanter. Right gluteal musculature edema and overlying soft tissue swelling. Prior healed fracture deformity of the anterior right iliac wing with heterotopic ossification. Electronically Signed  By: Prudencio Pair M.D.   On: 04/28/2019 22:23   DG Hips Bilat W or Wo Pelvis 3-4 Views  Result Date: 04/28/2019 CLINICAL DATA:  [place] from standing, right hip pain EXAM: DG HIP (WITH OR WITHOUT PELVIS) 3-4V BILAT COMPARISON:  02/20/2019 FINDINGS: Frontal view of the pelvis as well as frontal and frogleg lateral views of the right hip are obtained. Cassette artifact limits evaluation. There are numerous skin folds overlying the right hip. However, there is a subtle lucency perpendicular to the trabecular markings in the inter trochanteric region of  the central aspect of the proximal right femur, seen on both the frontal and frogleg lateral views. I cannot exclude a nondisplaced fracture, and CT or MRI may be useful for further evaluation. No other acute displaced fractures. The hips are well aligned. There remainder of the bony pelvis is normal. IMPRESSION: 1. Possible nondisplaced intertrochanteric right hip fracture as above. CT or MRI recommended for further evaluation. Electronically Signed   By: Randa Ngo M.D.   On: 04/28/2019 20:16    Positive ROS: All other systems have been reviewed and were otherwise negative with the exception of those mentioned in the HPI and as above.  Physical Exam: General: Alert, no acute distress Cardiovascular: No pedal edema Respiratory: No cyanosis, no use of accessory musculature GI: No organomegaly, abdomen is soft and non-tender Skin: No lesions in the area of chief complaint Neurologic: Sensation intact distally Psychiatric: Patient is competent for consent with normal mood and affect Lymphatic: No axillary or cervical lymphadenopathy  MUSCULOSKELETAL:  Right hip exam: Skin intact about the right hip. Distal pulses 2+. Distal sensation intact. Full ROM of both ankles.   Assessment: Right hip nondisplaced intertrochanteric femur fracture  Plan: I saw the patient this morning on behalf of Dr. Alvan Dame. We discussed his diagnosis of right hip fracture, as well as plans for surgical treatment. We are planning on right hip ORIF with IM nail, likely on Wednesday afternoon. Dr. Alvan Dame will also discuss this with him. He may continue normal diet today, and be NPO after midnight tonight for planned surgery tomorrow afternoon. Non-weightbearing for now. Continue pain management.    Maurice March, PA-C Cell [phone]   04/29/2019 9:45 AM

## 2019-04-29 NOTE — Consult Note (Addendum)
Cardiology Consultation:   Patient ID: Trevor Williams MRN: [id number]; DOB: 06-21-1960  Admit date: 04/28/2019 Date of Consult: 04/29/2019  Primary Care Provider: Elsie Stain, MD Primary Cardiologist: Loralie Champagne, MD  Primary Electrophysiologist:  None    Patient Profile:   Trevor Williams is a 59 y.o. 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 Maren Beach.  History of Present Illness:   Trevor Williams is first known to this service during a hospitalization 07/2018 with new diagnosis of systolic heart failure with biventricular dysfunction. Presented with acute respiratory failure seconcary to CAP and COPD exacerbation that required intubation for 4 days. EF that admission was 15-20% with diffuse hypokinesis, grade 2 DD, and elevated filling pressures as well as severely reduced RVEF. He underwent right and left heart cath which revealed nonobstructive coronary artery disease and low filling pressures. He was started on Entresto and Coreg. He did not see [facility] in follow up. He was admitted 01/2019 with multiple falls, weakness, found to have electrolyte disturbances and GI bleed, discharged on 02/01/19. He was taking his cardiac medications at that time. Discharged on protonix.  He presented to [facility] 04/28/19 after tripping on a rock and falling hitting his head with reported LOC. CT head and neck negative for bleed. CT abd/pelsvis with intertochanteric fracture, right hip. Alcohol level was 331. Hb stable 13.3, but thrombocytopenia with PLT 75000. Cardiology was consulted for preoperative clearance.  On my interview, he denies chest pain, SOB, orthopnea, and lower extremity swelling. He is currently living at [facility]. He was drinking yesterday and tripped and fell. He is  unsure if he lost consciousness. He has been drinking regularly. He has been compliant on Coreg and Entresto.    Heart Pathway Score:     Past Medical History:  Diagnosis Date  . Abnormal liver function tests   . Acute respiratory failure ([place])    a. 07/2018 requiring intubation - CAP/CHF.  Marland Kitchen Alcohol abuse   . Biventricular heart failure ([place])   . Cancer ([place])    skin (left hand)  . Delirium    a. h/o delirium while admitted  . Emphysema of lung ([place])   . Homelessness   . Macrocytic anemia   . Mild CAD    a. cath 07/2018 60% ostial ramus otherwise OK.  . Mitral regurgitation   . NICM (nonischemic cardiomyopathy) ([place])   . Protein calorie malnutrition ([place])   . Tricuspid regurgitation     Past Surgical History:  Procedure Laterality Date  . BIOPSY  01/28/2019   Procedure: BIOPSY;  Surgeon: Otis Brace, MD;  Location: [facility];  Service: Gastroenterology;;  . BIOPSY  01/30/2019   Procedure: BIOPSY;  Surgeon: Otis Brace, MD;  Location: [facility];  Service: Gastroenterology;;  . COLONOSCOPY WITH PROPOFOL N/A 01/30/2019   Procedure: COLONOSCOPY WITH PROPOFOL;  Surgeon: Otis Brace, MD;  Location: [facility];  Service: Gastroenterology;  Laterality: N/A;  . ESOPHAGOGASTRODUODENOSCOPY (EGD) WITH PROPOFOL N/A 01/28/2019   Procedure: ESOPHAGOGASTRODUODENOSCOPY (EGD) WITH PROPOFOL;  Surgeon: Otis Brace, MD;  Location: [facility];  Service: Gastroenterology;  Laterality: N/A;  . HEMOSTASIS CLIP PLACEMENT  01/30/2019   Procedure: HEMOSTASIS CLIP PLACEMENT;  Surgeon: Otis Brace, MD;  Location: [facility];  Service: Gastroenterology;;  . POLYPECTOMY  01/30/2019   Procedure: POLYPECTOMY;  Surgeon:  Otis Brace, MD;  Location: [facility];  Service: Gastroenterology;;  . RIGHT/LEFT HEART CATH AND CORONARY ANGIOGRAPHY N/A 08/09/2018   Procedure: RIGHT/LEFT HEART CATH AND CORONARY ANGIOGRAPHY;  Surgeon: Larey Dresser, MD;  Location: [facility];  Service: Cardiovascular;  Laterality: N/A;     Home Medications:  Prior to Admission medications   Medication Sig Start Date End Date Taking? Authorizing Provider  albuterol (VENTOLIN HFA) 108 (90 Base) MCG/ACT inhaler Inhale 2 puffs into the lungs every 4 (four) hours as needed for wheezing or shortness of breath. 03/20/19  Yes Elsie Stain, MD  carvedilol (COREG) 3.125 MG tablet Take 1 tablet (3.125 mg total) by mouth 2 (two) times daily with a meal. 04/23/19  Yes Elsie Stain, MD  feeding supplement, ENSURE ENLIVE, (ENSURE ENLIVE) LIQD Take 237 mLs by mouth 3 (three) times daily between meals. 02/01/19  Yes Elgergawy, Silver Huguenin, MD  folic acid (FOLVITE) 1 MG tablet Take 1 tablet (1 mg total) by mouth daily. 03/20/19  Yes Elsie Stain, MD  gabapentin (NEURONTIN) 300 MG capsule Take 1 capsule (300 mg total) by mouth 3 (three) times daily. 04/23/19  Yes Elsie Stain, MD  rosuvastatin (CRESTOR) 10 MG tablet Take 1 tablet (10 mg total) by mouth daily at 6 PM. 03/20/19  Yes Elsie Stain, MD  sacubitril-valsartan (ENTRESTO) 24-26 MG Take 1 tablet by mouth 2 (two) times daily. 03/20/19  Yes Elsie Stain, MD  thiamine 100 MG tablet Take 1 tablet (100 mg total) by mouth daily. 03/20/19  Yes Elsie Stain, MD  pantoprazole (PROTONIX) 40 MG tablet Take 1 tablet (40 mg total) by mouth daily. 03/20/19 04/19/19  Elsie Stain, MD    Inpatient Medications: Scheduled Meds: . carvedilol  3.125 mg Oral BID WC  . feeding supplement (ENSURE ENLIVE)  237 mL Oral TID BM  . folic acid  1 mg Oral Daily  . gabapentin  300 mg Oral TID  . multivitamin with minerals  1 tablet Oral Daily  . pantoprazole  40 mg Oral Daily  . rosuvastatin  10 mg Oral q1800  . sacubitril-valsartan  1 tablet Oral BID  . sodium chloride flush  3 mL Intravenous Q12H  . thiamine  100 mg Oral Daily   Or  . thiamine  100 mg Intravenous Daily   Continuous Infusions: . sodium chloride      . sodium chloride 75 mL/hr at 04/29/19 0144   PRN Meds: sodium chloride, HYDROcodone-acetaminophen, LORazepam **OR** LORazepam, morphine injection, sodium chloride flush  Allergies:    Allergies  Allergen Reactions  . Doxycycline Rash    Rash noted after administration of vancomycin, doxycycline, and ceftriaxone. Unclear cause of rash.  . Ibuprofen Rash  . Rocephin [Ceftriaxone] Rash    Rash noted after administration of vancomycin, doxycycline, and ceftriaxone. Unclear cause of rash.  . Tylenol [Acetaminophen] Rash  . Vancomycin Rash    Rash noted after administration of vancomycin, doxycycline, and ceftriaxone. Unclear cause of rash.    Social History:   Social History   Socioeconomic History  . Marital status: Single    Spouse name: Not on file  . Number of children: Not on file  . Years of education: Not on file  . Highest education level: Not on file  Occupational History  . Not on file  Tobacco Use  . Smoking status: Current Every Day Smoker    Packs/day: 1.00    Types: Cigarettes  . Smokeless tobacco: Current  User  Substance and Sexual Activity  . Alcohol use: Yes    Comment: Liquor daily - 1/2 of a fifth   . Drug use: No  . Sexual activity: Not on file  Other Topics Concern  . Not on file  Social History Narrative  . Not on file   Social Determinants of Health   Financial Resource Strain:   . Difficulty of Paying Living Expenses: Not on file  Food Insecurity:   . Worried About Charity fundraiser in the Last Year: Not on file  . Ran Out of Food in the Last Year: Not on file  Transportation Needs:   . Lack of Transportation (Medical): Not on file  . Lack of Transportation (Non-Medical): Not on file  Physical Activity:   . Days of Exercise per Week: Not on file  . Minutes of Exercise per Session: Not on file  Stress:   . Feeling of Stress : Not on file  Social Connections:   . Frequency of Communication with Friends and Family: Not on file  .  Frequency of Social Gatherings with Friends and Family: Not on file  . Attends Religious Services: Not on file  . Active Member of Clubs or Organizations: Not on file  . Attends Archivist Meetings: Not on file  . Marital Status: Not on file  Intimate Partner Violence:   . Fear of Current or Ex-Partner: Not on file  . Emotionally Abused: Not on file  . Physically Abused: Not on file  . Sexually Abused: Not on file    Family History:    Family History  Problem Relation Age of Onset  . Hypertension Maternal Grandfather      ROS:  Please see the history of present illness.   All other ROS reviewed and negative.     Physical Exam/Data:   Vitals:   04/29/19 0123 04/29/19 0647 04/29/19 0728 04/29/19 0857  BP: 121/71 110/68 112/69 122/77  Pulse: 95 83 79 77  Resp: 18 16    Temp: 99 F (37.2 C) 98.4 F (36.9 C)  98.7 F (37.1 C)  TempSrc: Oral Oral  Oral  SpO2: 96% 95%  97%  Weight: 59 kg     Height: 5\' 7"  (1.702 m)       Intake/Output Summary (Last 24 hours) at 04/29/2019 0908 Last data filed at 04/29/2019 0801 Gross per 24 hour  Intake 900.25 ml  Output 400 ml  Net 500.25 ml   Last 3 Weights 04/29/2019 04/28/2019 03/22/2019  Weight (lbs) 130 lb 130 lb 130 lb  Weight (kg) 58.968 kg 58.968 kg 58.968 kg     Body mass index is 20.36 kg/m.  General:  Well nourished, well developed, in no acute distress, but significant right hip pain HEENT: normal Neck: no JVD Vascular: No carotid bruits Cardiac:  normal S1, S2; RRR; no murmur  Lungs:  clear to auscultation bilaterally, no wheezing, rhonchi or rales  Abd: soft, nontender, no hepatomegaly  Ext: no edema Musculoskeletal:  No deformities, BUE and BLE strength normal and equal Skin: warm and dry  Neuro:  CNs 2-12 intact, no focal abnormalities noted Psych:  Normal affect   EKG:  The EKG was personally reviewed and demonstrates:  No tracing to review Telemetry:  Telemetry was personally reviewed and demonstrates:   N/A  Relevant CV Studies:  Right and left heart cath 08/09/18: 1. Nonobstructive coronary disease, nonischemic cardiomyopathy.  2. Low filling pressures.  3. Preserved cardiac output.  Echo 08/04/18: 1. The right ventricle has severely reduced systolic function. The cavity  was moderately enlarged. There is no increase in right ventricular wall  thickness.  2. Left atrial size was mildly dilated.  3. Right atrial size was mildly dilated.  4. The mitral valve is degenerative. Mild thickening of the mitral valve  leaflet. Mild calcification of the mitral valve leaflet. There is mild to  moderate mitral annular calcification present. Mitral valve regurgitation  is mild to moderate by color  flow Doppler.  5. The aortic valve is tricuspid. Mild thickening of the aortic valve. No  stenosis of the aortic valve.  6. Tricuspid valve regurgitation is moderate.  7. Trivial pericardial effusion is present.  8. When compared to the prior study: No prior echocardiogram available  for comparison.    Laboratory Data:  High Sensitivity Troponin:  No results for input(s): TROPONINIHS in the last 720 hours.   Chemistry Recent Labs  Lab 04/28/19 2056 04/29/19 0150  NA 142 141  K 4.0 4.7  CL 105 109  CO2 27 24  GLUCOSE 90 92  BUN 7 7  CREATININE 0.67 0.71  CALCIUM 9.1 8.1*  GFRNONAA >60 >60  GFRAA >60 >60  ANIONGAP 10 8    Recent Labs  Lab 04/28/19 2056  PROT 7.8  ALBUMIN 3.9  AST 65*  ALT 31  ALKPHOS 99  BILITOT 0.6   Hematology Recent Labs  Lab 04/28/19 1934 04/29/19 0150  WBC 4.9 6.7  RBC 4.27 3.71*  HGB 15.2 13.3  HCT 46.4 41.4  MCV 108.7* 111.6*  MCH 35.6* 35.8*  MCHC 32.8 32.1  RDW 12.9 13.0  PLT 80* 75*   BNPNo results for input(s): BNP, PROBNP in the last 168 hours.  DDimer No results for input(s): DDIMER in the last 168 hours.   Radiology/Studies:  CT Head Wo Contrast  Result Date: 04/28/2019 CLINICAL DATA:  59 year old male with fall.  EXAM: CT HEAD WITHOUT CONTRAST CT CERVICAL SPINE WITHOUT CONTRAST TECHNIQUE: Multidetector CT imaging of the head and cervical spine was performed following the standard protocol without intravenous contrast. Multiplanar CT image reconstructions of the cervical spine were also generated. COMPARISON:  Head CT dated 03/23/2019. 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 There is otherwise background of age-related atrophy and chronic microvascular ischemic changes. Incidental note of a cavum septum pellucidum and cavum vergae. There is no acute intracranial hemorrhage. No mass effect or midline shift. No extra-axial fluid collection. Vascular: No hyperdense vessel or unexpected calcification. Skull: Normal. Negative for fracture or focal lesion. Sinuses/Orbits: No acute finding. Other: None CT CERVICAL SPINE FINDINGS Alignment: No acute subluxation. Skull base and vertebrae: No acute fracture. Soft tissues and spinal canal: No prevertebral fluid or swelling. No visible canal hematoma. Disc levels: Multilevel degenerative changes with endplate irregularity and disc space narrowing and spurring. Upper chest: Large right apical subpleural blebs. Other: None IMPRESSION: 1. No acute intracranial hemorrhage. 2. Age-related atrophy and chronic microvascular ischemic changes. Old right MCA territory infarct. 3. No acute/traumatic cervical spine pathology. Multilevel degenerative changes. Electronically Signed   By: Anner Crete M.D.   On: 04/28/2019 22:22   CT Cervical Spine Wo Contrast  Result Date: 04/28/2019 CLINICAL DATA:  59 year old male with fall. EXAM: CT HEAD WITHOUT CONTRAST CT CERVICAL SPINE WITHOUT CONTRAST TECHNIQUE: Multidetector CT imaging of the head and cervical spine was performed following the standard protocol without intravenous contrast. Multiplanar CT image reconstructions of the cervical spine were also  generated. COMPARISON:  Head CT dated  03/23/2019. 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 There is otherwise background of age-related atrophy and chronic microvascular ischemic changes. Incidental note of a cavum septum pellucidum and cavum vergae. There is no acute intracranial hemorrhage. No mass effect or midline shift. No extra-axial fluid collection. Vascular: No hyperdense vessel or unexpected calcification. Skull: Normal. Negative for fracture or focal lesion. Sinuses/Orbits: No acute finding. Other: None CT CERVICAL SPINE FINDINGS Alignment: No acute subluxation. Skull base and vertebrae: No acute fracture. Soft tissues and spinal canal: No prevertebral fluid or swelling. No visible canal hematoma. Disc levels: Multilevel degenerative changes with endplate irregularity and disc space narrowing and spurring. Upper chest: Large right apical subpleural blebs. Other: None IMPRESSION: 1. No acute intracranial hemorrhage. 2. Age-related atrophy and chronic microvascular ischemic changes. Old right MCA territory infarct. 3. No acute/traumatic cervical spine pathology. Multilevel degenerative changes. Electronically Signed   By: Anner Crete M.D.   On: 04/28/2019 22:22   CT PELVIS WO CONTRAST  Result Date: 04/28/2019 CLINICAL DATA:  Right hip pain after injury EXAM: CT PELVIS WITHOUT CONTRAST TECHNIQUE: Multidetector CT imaging of the pelvis was performed following the standard protocol without intravenous contrast. COMPARISON:  Radiograph February 20, 2019 FINDINGS: Urinary Tract: The visualized distal ureters and bladder appear unremarkable. Bowel: No bowel wall thickening, distention or surrounding inflammation identified within the pelvis. Scattered colonic diverticula are noted without diverticulitis. Vascular/Lymphatic: No enlarged pelvic lymph nodes identified. Scattered aortic atherosclerotic calcifications are seen without aneurysmal dilatation. Reproductive:  The prostate is  unremarkable. Other: Mild soft tissue swelling seen over the right gluteal musculature. No soft tissue hematoma seen. Musculoskeletal: There is a healed fracture deformity seen at the anterior right iliac wing with small heterotopic ossification. There is a nondisplaced right intratrochanteric fracture extending through the superior portion of the greater trochanter. The femoral head is still well seated within the acetabulum. There is moderate bilateral hip osteoarthritis. Degenerative changes seen in the lower lumbar spine. There is heterogeneous subtle enlargement of the posterior right gluteal musculature, likely intramuscular edema. IMPRESSION: Acute nondisplaced right intratrochanteric fracture extending through the superior posterior greater trochanter. Right gluteal musculature edema and overlying soft tissue swelling. Prior healed fracture deformity of the anterior right iliac wing with heterotopic ossification. Electronically Signed   By: Prudencio Pair M.D.   On: 04/28/2019 22:23   DG Hips Bilat W or Wo Pelvis 3-4 Views  Result Date: 04/28/2019 CLINICAL DATA:  [place] from standing, right hip pain EXAM: DG HIP (WITH OR WITHOUT PELVIS) 3-4V BILAT COMPARISON:  02/20/2019 FINDINGS: Frontal view of the pelvis as well as frontal and frogleg lateral views of the right hip are obtained. Cassette artifact limits evaluation. There are numerous skin folds overlying the right hip. However, there is a subtle lucency perpendicular to the trabecular markings in the inter trochanteric region of the central aspect of the proximal right femur, seen on both the frontal and frogleg lateral views. I cannot exclude a nondisplaced fracture, and CT or MRI may be useful for further evaluation. No other acute displaced fractures. The hips are well aligned. There remainder of the bony pelvis is normal. IMPRESSION: 1. Possible nondisplaced intertrochanteric right hip fracture as above. CT or MRI recommended for further evaluation.  Electronically Signed   By: Randa Ngo M.D.   On: 04/28/2019 20:16   {  Assessment and Plan:   1. Chronic systolic heart failure 2. Nonischemic cardiomyopathy 3. Ongoing alcohol abuse -  likely related to alcohol abuse - ETOH level on admission 331 - he was started on evidence based therapies for heart failure during his last hospitalization 07/2018, but lost to follow up - he has been compliant on Entresto 24-26 mg BID and Coreg 3.125 mg BID - he appears euvolemic on exam - continue medications   4. Preoperative evaluation for cardiac complications in the perioperative period - heart cath 07/2018 reassuring without evidence for obstructive CAD - does not require insulin and presented with normal renal function - According to the RCRI, he is low risk for planned surgery with a 0.9% risk of major cardiac complication - his main risk is heart failure exacerbation in relation to fluids used during the perioperative period - no further workup needed prior to surgery - OK to hold ASA      For questions or updates, please contact [place] Please consult [email or web address] for contact info under     Signed, Ledora Bottcher, PA  04/29/2019 9:08 AM  History and all data above reviewed.  Patient examined.  I agree with the findings as above.  The patient has class I CHF.  The patient denies any new symptoms such as chest discomfort, neck or arm discomfort. There has been no new shortness of breath, PND or orthopnea. There have been no reported palpitations, presyncope or syncope.  Very high functional level.  Not going for a high risk procedure from a cardiovascular standpoint.  The patient exam reveals COR:RRR  ,  Lungs: Clear  ,  Abd: Positive bowel sounds, no rebound no guarding, Ext No edema  .  All available labs, radiology testing, previous records reviewed. Agree with documented assessment and plan.   EKG Pending.  According to ACC/AHA guidelines the patient is at acceptable risk  for surgery.  We will need to watch his volume status very closely.  I discussed his diagnosis with him.    Jeneen Rinks Japneet Staggs  12:07 PM  04/29/2019

## 2019-04-29 NOTE — Progress Notes (Signed)
OT Cancellation Note  Patient Details Name: Trevor Williams MRN: [id number] DOB: 09-07-1960   Cancelled Treatment:    Reason Eval/Treat Not Completed: Medical issues which prohibited therapy.  Pt with R hip fx; sx is scheduled Wednesday. Will check back.  Zaiya Annunziato 04/29/2019, 8:17 AM  Karsten Ro, OTR/L Acute Rehabilitation Services 04/29/2019

## 2019-04-29 NOTE — Progress Notes (Signed)
Patient seen and examined personally, I reviewed the chart, history and physical and admission note, done by admitting physician this morning and agree with the same with following addendum.  Please refer to the morning admission note for more detailed plan of care.  Briefly,  [age]-year-old M with history of alcohol abuse/DTs, chronic systolic CHF/an ICM, history of respiratory failure due to pneumonia and CHF, status post intubation admitted with right hip pain after he tripped over a rock and fell on the right hip. In the ED CT head, neck no acute finding, CT abdomen pelvis showed intertrochanteric fracture right hip.  Blood work with alcohol level 330, platelet 80,000. Patient admitted for right hip intertrochanteric fracture  On my exam is alert awake oriented, no shakes or tremulousness.  Denies having any cardiac issues likely not taking medications. Denies shortness of breath chest pain or leg swelling. On exam lungs are clear, no leg edema, alert awake oriented and not in acute distress.  Right hip intertrochanteric fracture-noted plan for operative intervention on Wednesday.  Cbc, cmp stable except low plt counts.  Cardiology consulted for preoperative evaluation given his chronic CHF, suspect noncompliance too.  Check proBNP.  Chronic congestive heart failure with systolic dysfunction/NICM: Echo reviewed from 08/03/2025 LV moderately dilated with severe systolic dysfunction EF 15 to 25%, severely reduced RVEF.  At home on Coreg 3.125, Crestor 10 mg, Entresto 25-26. Check TTE. Consulted cardio for preoperative risk assessment.   Alcohol abuse with alcohol intoxication with history of DTs, alcohol level 331; started on alcohol withdrawal protocol, thiamine and folate and IV fluids.   Start Librium taper starting at 25 mg tid  Chronic thrombocytopenia: Suspect in the setting of chronic alcohol abuse.  Monitor platelet count.  Chronic bronchitis: Doing well on room air.  Social situation:  Homeless  COVID 19 NEG  TTE 08/04/2018  "Left ventricle is moderately dilated with severe systolic  dysfunction, estimated LVEF 15-20% with diffuse hypokinesis, grade 2  diastolic dysfunction and elevated filling pressures.  RVEF is severely reduced."

## 2019-04-29 NOTE — Progress Notes (Signed)
Initial Nutrition Assessment  INTERVENTION:   -Ensure Enlive po TID, each supplement provides 350 kcal and 20 grams of protein -Magic cup BID with meals, each supplement provides 290 kcal and 9 grams of protein  NUTRITION DIAGNOSIS:   Increased nutrient needs related to social / environmental circumstances, hip fracture(ETOH abuse) as evidenced by estimated needs.  GOAL:   Patient will meet greater than or equal to 90% of their needs  MONITOR:   PO intake, Supplement acceptance, Labs, Weight trends, I & O's  REASON FOR ASSESSMENT:   Consult Hip fracture protocol  ASSESSMENT:   59 y.o. male with medical history significant of alcohol abuse, history of DTs and delirium, congestive heart failure systolic dysfunction, nonischemic cardiomyopathy, history of respiratory failure secondary to pneumonia and CHF, status post intubation, came with a chief complaint of pain in the right hip.Patient states he was walking and tripped over a rock landing on his right hip.  He states that he also hit his head lost consciousness.  **RD working remotely**  Patient currently homeless. Pt was intoxicated and fell and hit his head PTA. Patient with continued ETOH abuse, drinks liquor daily. Pt with right hip fracture, scheduled for surgery 2/10. No PO documented for this admission yet. Diet was just started this admission.  Pt is accepting Ensure supplements. Will order Magic Cups with meals as well.  Per weight records, no recent weights have been measured since December 2020.  Medications: Folic acid tablet, Multivitamin with minerals daily, Thiamine tablet Labs reviewed: Elevated Phos    NUTRITION - FOCUSED PHYSICAL EXAM:  Working remotely.  Diet Order:   Diet Order            Diet Heart Room service appropriate? Yes; Fluid consistency: Thin  Diet effective now              EDUCATION NEEDS:   No education needs have been identified at this time  Skin:  Skin Assessment:  Reviewed RN Assessment  Last BM:  2/8  Height:   Ht Readings from Last 1 Encounters:  04/29/19 5\' 7"  (1.702 m)    Weight:   Wt Readings from Last 1 Encounters:  04/29/19 59 kg    Ideal Body Weight:  61.3 kg  BMI:  Body mass index is 20.36 kg/m.  Estimated Nutritional Needs:   Kcal:  1800-2000  Protein:  85-95g  Fluid:  2L/day  Mendel Ryder, MS, RD, LDN Inpatient Clinical Dietitian Contact information available via Amion

## 2019-04-29 NOTE — Plan of Care (Signed)

## 2019-04-29 NOTE — Progress Notes (Signed)
Attempted echo. Patient said bad time due to his pain. Will try again later as schedule permits.

## 2019-04-29 NOTE — Progress Notes (Signed)
PT Cancellation Note  Patient Details Name: Trevor Williams MRN: [id number] DOB: 1960/11/14   Cancelled Treatment:    Reason Eval/Treat Not Completed: Patient not medically ready--pt is awaiting surgery. will sign off. ortho MD, please reorder PT eval once appropriate. thanks.    Doreatha Massed, PT Acute Rehabilitation

## 2019-04-30 ENCOUNTER — Encounter (HOSPITAL_COMMUNITY)
Admission: EM | Disposition: A | Discharge status: Skilled Nursing Facility | Payer: Self-pay | Source: Home / Self Care | Attending: Internal Medicine

## 2019-04-30 ENCOUNTER — Encounter (HOSPITAL_COMMUNITY): Payer: Self-pay | Admitting: Emergency Medicine

## 2019-04-30 ENCOUNTER — Inpatient Hospital Stay (HOSPITAL_COMMUNITY): Payer: Medicaid Other | Admitting: Certified Registered"

## 2019-04-30 ENCOUNTER — Inpatient Hospital Stay (HOSPITAL_COMMUNITY): Payer: Medicaid Other

## 2019-04-30 DIAGNOSIS — I5031 Acute diastolic (congestive) heart failure: Secondary | ICD-10-CM

## 2019-04-30 HISTORY — PX: FEMUR IM NAIL: SHX1597

## 2019-04-30 LAB — ECHOCARDIOGRAM COMPLETE
Height: 67 in
Weight: 2080.01 oz

## 2019-04-30 LAB — CBC
HCT: 42.6 % (ref 39.0–52.0)
Hemoglobin: 14.3 g/dL (ref 13.0–17.0)
MCH: 36.7 pg — ABNORMAL HIGH (ref 26.0–34.0)
MCHC: 33.6 g/dL (ref 30.0–36.0)
MCV: 109.2 fL — ABNORMAL HIGH (ref 80.0–100.0)
Platelets: 62 10*3/uL — ABNORMAL LOW (ref 150–400)
RBC: 3.9 MIL/uL — ABNORMAL LOW (ref 4.22–5.81)
RDW: 12.7 % (ref 11.5–15.5)
WBC: 7.4 10*3/uL (ref 4.0–10.5)
nRBC: 0 % (ref 0.0–0.2)

## 2019-04-30 LAB — ECHOCARDIOGRAM LIMITED
Height: 67 in
Weight: 2080.01 oz

## 2019-04-30 LAB — URINE CULTURE: Culture: <10000 — AB

## 2019-04-30 SURGERY — INSERTION, INTRAMEDULLARY ROD, FEMUR
Anesthesia: General | Laterality: Right

## 2019-04-30 MED ORDER — TRANEXAMIC ACID-NACL 1000-0.7 MG/100ML-% IV SOLN
1000.0000 mg | INTRAVENOUS | Status: AC
  Administered 2019-04-30: 1000 mg via INTRAVENOUS
  Filled 2019-04-30: qty 100

## 2019-04-30 MED ORDER — PROPOFOL 10 MG/ML IV BOLUS
INTRAVENOUS | Status: DC | PRN
  Administered 2019-04-30 (×2): 50 mg via INTRAVENOUS

## 2019-04-30 MED ORDER — POLYETHYLENE GLYCOL 3350 17 G PO PACK: 17.0000 g | PACK | Freq: Every day | ORAL | Status: DC | PRN

## 2019-04-30 MED ORDER — SACUBITRIL-VALSARTAN 24-26 MG PO TABS
1.0000 | ORAL_TABLET | Freq: Two times a day (BID) | ORAL | Status: DC
  Administered 2019-05-01 – 2019-05-07 (×13): 1 via ORAL
  Filled 2019-04-30 (×13): qty 1

## 2019-04-30 MED ORDER — HYDROCODONE-ACETAMINOPHEN 7.5-325 MG PO TABS
1.0000 | ORAL_TABLET | ORAL | Status: DC | PRN
  Administered 2019-04-30: 1 via ORAL
  Administered 2019-05-01 – 2019-05-06 (×12): 2 via ORAL
  Filled 2019-04-30: qty 2
  Filled 2019-04-30: qty 1
  Filled 2019-04-30 (×13): qty 2

## 2019-04-30 MED ORDER — ETOMIDATE 2 MG/ML IV SOLN
INTRAVENOUS | Status: AC
  Filled 2019-04-30: qty 10

## 2019-04-30 MED ORDER — CEFAZOLIN SODIUM-DEXTROSE 2-4 GM/100ML-% IV SOLN
2.0000 g | INTRAVENOUS | Status: AC
  Administered 2019-04-30: 2 g via INTRAVENOUS
  Filled 2019-04-30: qty 100

## 2019-04-30 MED ORDER — PHENYLEPHRINE HCL-NACL 20-0.9 MG/250ML-% IV SOLN
INTRAVENOUS | Status: DC | PRN
  Administered 2019-04-30: 60 ug/min via INTRAVENOUS

## 2019-04-30 MED ORDER — SUGAMMADEX SODIUM 200 MG/2ML IV SOLN
INTRAVENOUS | Status: DC | PRN
  Administered 2019-04-30: 200 mg via INTRAVENOUS

## 2019-04-30 MED ORDER — PHENYLEPHRINE HCL (PRESSORS) 10 MG/ML IV SOLN
INTRAVENOUS | Status: AC
  Filled 2019-04-30: qty 1

## 2019-04-30 MED ORDER — PHENOL 1.4 % MT LIQD: 1.0000 | OROMUCOSAL | Status: DC | PRN

## 2019-04-30 MED ORDER — PERFLUTREN LIPID MICROSPHERE
1.0000 mL | INTRAVENOUS | Status: AC | PRN
  Administered 2019-04-30: 4 mL via INTRAVENOUS
  Filled 2019-04-30: qty 10

## 2019-04-30 MED ORDER — LIDOCAINE 2% (20 MG/ML) 5 ML SYRINGE
INTRAMUSCULAR | Status: DC | PRN
  Administered 2019-04-30: 60 mg via INTRAVENOUS

## 2019-04-30 MED ORDER — ALUM & MAG HYDROXIDE-SIMETH 200-200-20 MG/5ML PO SUSP
30.0000 mL | ORAL | Status: DC | PRN
  Administered 2019-05-01 – 2019-05-03 (×4): 30 mL via ORAL
  Filled 2019-04-30 (×4): qty 30

## 2019-04-30 MED ORDER — DEXMEDETOMIDINE HCL IN NACL 200 MCG/50ML IV SOLN
INTRAVENOUS | Status: AC
  Filled 2019-04-30: qty 50

## 2019-04-30 MED ORDER — POVIDONE-IODINE 10 % EX SWAB
2.0000 "application " | Freq: Once | CUTANEOUS | Status: AC
  Administered 2019-04-30: 2 via TOPICAL

## 2019-04-30 MED ORDER — FENTANYL CITRATE (PF) 250 MCG/5ML IJ SOLN
INTRAMUSCULAR | Status: AC
  Filled 2019-04-30: qty 5

## 2019-04-30 MED ORDER — ACETAMINOPHEN 500 MG PO TABS
500.0000 mg | ORAL_TABLET | Freq: Four times a day (QID) | ORAL | Status: AC
  Administered 2019-05-01: 500 mg via ORAL
  Filled 2019-04-30: qty 1

## 2019-04-30 MED ORDER — PROPOFOL 10 MG/ML IV BOLUS
INTRAVENOUS | Status: AC
  Filled 2019-04-30: qty 20

## 2019-04-30 MED ORDER — FENTANYL CITRATE (PF) 100 MCG/2ML IJ SOLN
INTRAMUSCULAR | Status: DC | PRN
  Administered 2019-04-30 (×5): 50 ug via INTRAVENOUS

## 2019-04-30 MED ORDER — DEXAMETHASONE SODIUM PHOSPHATE 10 MG/ML IJ SOLN
INTRAMUSCULAR | Status: DC | PRN
  Administered 2019-04-30: 8 mg via INTRAVENOUS

## 2019-04-30 MED ORDER — ASPIRIN EC 81 MG PO TBEC
81.0000 mg | DELAYED_RELEASE_TABLET | Freq: Two times a day (BID) | ORAL | Status: DC
  Administered 2019-05-01 – 2019-05-07 (×13): 81 mg via ORAL
  Filled 2019-04-30 (×14): qty 1

## 2019-04-30 MED ORDER — MIDAZOLAM HCL 5 MG/5ML IJ SOLN
INTRAMUSCULAR | Status: DC | PRN
  Administered 2019-04-30: 2 mg via INTRAVENOUS

## 2019-04-30 MED ORDER — ESMOLOL HCL 100 MG/10ML IV SOLN
INTRAVENOUS | Status: DC | PRN
  Administered 2019-04-30: 20 mg via INTRAVENOUS
  Administered 2019-04-30 (×2): 50 mg via INTRAVENOUS
  Administered 2019-04-30: 30 mg via INTRAVENOUS
  Administered 2019-04-30: 20 mg via INTRAVENOUS

## 2019-04-30 MED ORDER — BISACODYL 5 MG PO TBEC: 5.0000 mg | DELAYED_RELEASE_TABLET | Freq: Every day | ORAL | Status: DC | PRN

## 2019-04-30 MED ORDER — MENTHOL 3 MG MT LOZG: 1.0000 | LOZENGE | OROMUCOSAL | Status: DC | PRN

## 2019-04-30 MED ORDER — LACTATED RINGERS IV SOLN: INTRAVENOUS | Status: DC | PRN

## 2019-04-30 MED ORDER — ONDANSETRON HCL 4 MG/2ML IJ SOLN
INTRAMUSCULAR | Status: AC
  Filled 2019-04-30: qty 2

## 2019-04-30 MED ORDER — VASOPRESSIN 20 UNIT/ML IV SOLN
INTRAVENOUS | Status: AC
  Filled 2019-04-30: qty 1

## 2019-04-30 MED ORDER — DOCUSATE SODIUM 100 MG PO CAPS
100.0000 mg | ORAL_CAPSULE | Freq: Two times a day (BID) | ORAL | Status: DC
  Administered 2019-04-30 – 2019-05-07 (×14): 100 mg via ORAL
  Filled 2019-04-30 (×14): qty 1

## 2019-04-30 MED ORDER — ONDANSETRON HCL 4 MG/2ML IJ SOLN: 4.0000 mg | Freq: Four times a day (QID) | INTRAMUSCULAR | Status: DC | PRN

## 2019-04-30 MED ORDER — METHOCARBAMOL 500 MG IVPB - SIMPLE MED
500.0000 mg | Freq: Four times a day (QID) | INTRAVENOUS | Status: DC | PRN
  Filled 2019-04-30: qty 50

## 2019-04-30 MED ORDER — ROCURONIUM BROMIDE 10 MG/ML (PF) SYRINGE
PREFILLED_SYRINGE | INTRAVENOUS | Status: DC | PRN
  Administered 2019-04-30: 10 mg via INTRAVENOUS
  Administered 2019-04-30: 50 mg via INTRAVENOUS

## 2019-04-30 MED ORDER — LIDOCAINE 2% (20 MG/ML) 5 ML SYRINGE
INTRAMUSCULAR | Status: AC
  Filled 2019-04-30: qty 5

## 2019-04-30 MED ORDER — METOCLOPRAMIDE HCL 5 MG/ML IJ SOLN: 5.0000 mg | Freq: Three times a day (TID) | INTRAMUSCULAR | Status: DC | PRN

## 2019-04-30 MED ORDER — PHENYLEPHRINE 40 MCG/ML (10ML) SYRINGE FOR IV PUSH (FOR BLOOD PRESSURE SUPPORT)
PREFILLED_SYRINGE | INTRAVENOUS | Status: AC
  Filled 2019-04-30: qty 10

## 2019-04-30 MED ORDER — NICOTINE 14 MG/24HR TD PT24
14.0000 mg | MEDICATED_PATCH | Freq: Every day | TRANSDERMAL | Status: DC
  Administered 2019-04-30 – 2019-05-07 (×8): 14 mg via TRANSDERMAL
  Filled 2019-04-30 (×9): qty 1

## 2019-04-30 MED ORDER — METOCLOPRAMIDE HCL 5 MG PO TABS: 5.0000 mg | ORAL_TABLET | Freq: Three times a day (TID) | ORAL | Status: DC | PRN

## 2019-04-30 MED ORDER — CEFAZOLIN SODIUM-DEXTROSE 2-4 GM/100ML-% IV SOLN
2.0000 g | Freq: Four times a day (QID) | INTRAVENOUS | Status: AC
  Administered 2019-04-30 – 2019-05-01 (×2): 2 g via INTRAVENOUS
  Filled 2019-04-30 (×2): qty 100

## 2019-04-30 MED ORDER — ONDANSETRON HCL 4 MG/2ML IJ SOLN
INTRAMUSCULAR | Status: DC | PRN
  Administered 2019-04-30: 4 mg via INTRAVENOUS

## 2019-04-30 MED ORDER — MIDAZOLAM HCL 2 MG/2ML IJ SOLN
INTRAMUSCULAR | Status: AC
  Filled 2019-04-30: qty 2

## 2019-04-30 MED ORDER — ONDANSETRON HCL 4 MG PO TABS: 4.0000 mg | ORAL_TABLET | Freq: Four times a day (QID) | ORAL | Status: DC | PRN

## 2019-04-30 MED ORDER — METHOCARBAMOL 500 MG PO TABS
500.0000 mg | ORAL_TABLET | Freq: Four times a day (QID) | ORAL | Status: DC | PRN
  Administered 2019-05-01 – 2019-05-05 (×7): 500 mg via ORAL
  Filled 2019-04-30 (×7): qty 1

## 2019-04-30 MED ORDER — SUCCINYLCHOLINE CHLORIDE 200 MG/10ML IV SOSY
PREFILLED_SYRINGE | INTRAVENOUS | Status: DC | PRN
  Administered 2019-04-30: 120 mg via INTRAVENOUS

## 2019-04-30 MED ORDER — ETOMIDATE 2 MG/ML IV SOLN
INTRAVENOUS | Status: DC | PRN
  Administered 2019-04-30: 36 mg via INTRAVENOUS

## 2019-04-30 MED ORDER — EPHEDRINE 5 MG/ML INJ
INTRAVENOUS | Status: AC
  Filled 2019-04-30: qty 10

## 2019-04-30 MED ORDER — FERROUS SULFATE 325 (65 FE) MG PO TABS
325.0000 mg | ORAL_TABLET | Freq: Three times a day (TID) | ORAL | Status: DC
  Administered 2019-04-30 – 2019-05-07 (×19): 325 mg via ORAL
  Filled 2019-04-30 (×20): qty 1

## 2019-04-30 MED ORDER — PHENYLEPHRINE 40 MCG/ML (10ML) SYRINGE FOR IV PUSH (FOR BLOOD PRESSURE SUPPORT)
PREFILLED_SYRINGE | INTRAVENOUS | Status: DC | PRN
  Administered 2019-04-30: 80 ug via INTRAVENOUS

## 2019-04-30 MED ORDER — MORPHINE SULFATE (PF) 2 MG/ML IV SOLN
0.5000 mg | INTRAVENOUS | Status: DC | PRN
  Administered 2019-05-02: 1 mg via INTRAVENOUS
  Filled 2019-04-30: qty 1

## 2019-04-30 MED ORDER — ESMOLOL HCL 100 MG/10ML IV SOLN
INTRAVENOUS | Status: AC
  Filled 2019-04-30: qty 10

## 2019-04-30 MED ORDER — DEXAMETHASONE SODIUM PHOSPHATE 10 MG/ML IJ SOLN
INTRAMUSCULAR | Status: AC
  Filled 2019-04-30: qty 1

## 2019-04-30 MED ORDER — HYDROCODONE-ACETAMINOPHEN 5-325 MG PO TABS
1.0000 | ORAL_TABLET | ORAL | Status: DC | PRN
  Administered 2019-05-01 – 2019-05-04 (×4): 2 via ORAL
  Administered 2019-05-04 (×3): 1 via ORAL
  Administered 2019-05-04 – 2019-05-07 (×6): 2 via ORAL
  Filled 2019-04-30: qty 2
  Filled 2019-04-30 (×2): qty 1
  Filled 2019-04-30 (×2): qty 2
  Filled 2019-04-30: qty 1
  Filled 2019-04-30 (×8): qty 2

## 2019-04-30 MED ORDER — FENTANYL CITRATE (PF) 100 MCG/2ML IJ SOLN
25.0000 ug | INTRAMUSCULAR | Status: DC | PRN
  Administered 2019-04-30 (×2): 50 ug via INTRAVENOUS

## 2019-04-30 MED ORDER — FENTANYL CITRATE (PF) 100 MCG/2ML IJ SOLN
INTRAMUSCULAR | Status: AC
  Filled 2019-04-30: qty 2

## 2019-04-30 MED ORDER — CHLORHEXIDINE GLUCONATE 4 % EX LIQD: 60.0000 mL | Freq: Once | CUTANEOUS | Status: DC

## 2019-04-30 MED ORDER — LACTATED RINGERS IV SOLN: INTRAVENOUS | Status: DC

## 2019-04-30 MED ORDER — 0.9 % SODIUM CHLORIDE (POUR BTL) OPTIME
TOPICAL | Status: DC | PRN
  Administered 2019-04-30: 1000 mL

## 2019-04-30 SURGICAL SUPPLY — 30 items
BAG ZIPLOCK 12X15 (MISCELLANEOUS) IMPLANT
BIT DRILL CANN LG 4.3MM (BIT) IMPLANT
BNDG GAUZE ELAST 4 BULKY (GAUZE/BANDAGES/DRESSINGS) ×3 IMPLANT
COVER PERINEAL POST (MISCELLANEOUS) ×3 IMPLANT
COVER SURGICAL LIGHT HANDLE (MISCELLANEOUS) ×3 IMPLANT
COVER WAND RF STERILE (DRAPES) IMPLANT
DRILL BIT CANN LG 4.3MM (BIT) ×3
DRSG AQUACEL AG ADV 3.5X 4 (GAUZE/BANDAGES/DRESSINGS) ×2 IMPLANT
DRSG AQUACEL AG ADV 3.5X 6 (GAUZE/BANDAGES/DRESSINGS) ×2 IMPLANT
DURAPREP 26ML APPLICATOR (WOUND CARE) ×3 IMPLANT
ELECT REM PT RETURN 15FT ADLT (MISCELLANEOUS) ×3 IMPLANT
GLOVE BIOGEL PI IND STRL 7.5 (GLOVE) ×1 IMPLANT
GLOVE BIOGEL PI INDICATOR 7.5 (GLOVE) ×2
GLOVE ECLIPSE 8.0 STRL XLNG CF (GLOVE) ×3 IMPLANT
GLOVE ORTHO TXT STRL SZ7.5 (GLOVE) ×3 IMPLANT
GOWN STRL REUS W/TWL LRG LVL3 (GOWN DISPOSABLE) ×3 IMPLANT
GOWN STRL REUS W/TWL XL LVL3 (GOWN DISPOSABLE) ×3 IMPLANT
GUIDEPIN 3.2X17.5 THRD DISP (PIN) ×2 IMPLANT
KIT BASIN OR (CUSTOM PROCEDURE TRAY) ×3 IMPLANT
KIT TURNOVER KIT A (KITS) IMPLANT
MANIFOLD NEPTUNE II (INSTRUMENTS) ×3 IMPLANT
NAIL HIP FRACT 130D 11X180 (Screw) ×2 IMPLANT
PACK GENERAL/GYN (CUSTOM PROCEDURE TRAY) ×3 IMPLANT
PENCIL SMOKE EVACUATOR (MISCELLANEOUS) IMPLANT
PROTECTOR NERVE ULNAR (MISCELLANEOUS) ×3 IMPLANT
SCREW LAG 10.5MMX105MM HFN (Screw) ×2 IMPLANT
SUT VIC AB 1 CT1 27 (SUTURE) ×2
SUT VIC AB 1 CT1 27XBRD ANTBC (SUTURE) ×1 IMPLANT
SUT VIC AB 2-0 CT1 27 (SUTURE) ×2
SUT VIC AB 2-0 CT1 TAPERPNT 27 (SUTURE) ×1 IMPLANT

## 2019-04-30 NOTE — Anesthesia Preprocedure Evaluation (Addendum)
Anesthesia Evaluation  Patient identified by MRN, date of birth, ID band Patient awake    Reviewed: Allergy & Precautions, NPO status , Patient's Chart, lab work & pertinent test results  Airway Mallampati: I  TM Distance: >3 FB Neck ROM: Full    Dental  (+) Edentulous Upper, Edentulous Lower   Pulmonary pneumonia, resolved, COPD, Current Smoker and Patient abstained from smoking.,  Respiratory failure 2/2 pneumonia requiring intubation 07/2018    + decreased breath sounds      Cardiovascular + CAD and +CHF  Normal cardiovascular exam Rhythm:Regular Rate:Tachycardia  TTE 07/2018 Left ventricle is moderately dilated with severe systolic  dysfunction, estimated LVEF 15-20% with diffuse hypokinesis, grade 2 diastolic dysfunction and elevated filling pressures. RVEF is severely reduced.  [place] 07/2018 1. Nonobstructive coronary disease, nonischemic cardiomyopathy.  2. Low filling pressures.  3. Preserved cardiac output.   TTE 04/30/2019 - Poor definity contrast study. Overall EF is estimated at 30% with global hypokinesis. Right ventricular systolic function is normal. The right ventricular  size is normal. There is normal pulmonary artery systolic pressure.  - Right atrial size was mildly dilated - no significant valvular abnormalities    Neuro/Psych negative neurological ROS  negative psych ROS   GI/Hepatic negative GI ROS, (+) Cirrhosis     substance abuse  alcohol use, H/o DT/seizures, currently on librium   Endo/Other  negative endocrine ROS  Renal/GU negative Renal ROS  negative genitourinary   Musculoskeletal negative musculoskeletal ROS (+)   Abdominal   Peds  Hematology  (+) Blood dyscrasia (plt 62), ,   Anesthesia Other Findings   Reproductive/Obstetrics                          Anesthesia Physical Anesthesia Plan  ASA: IV  Anesthesia Plan: General   Post-op Pain Management:     Induction: Intravenous  PONV Risk Score and Plan: 1 and Midazolam and Dexamethasone  Airway Management Planned: Oral ETT  Additional Equipment: Arterial line  Intra-op Plan:   Post-operative Plan: Extubation in OR  Informed Consent: I have reviewed the patients History and Physical, chart, labs and discussed the procedure including the risks, benefits and alternatives for the proposed anesthesia with the patient or authorized representative who has indicated his/her understanding and acceptance.     Dental advisory given  Plan Discussed with: CRNA  Anesthesia Plan Comments:         Anesthesia Quick Evaluation

## 2019-04-30 NOTE — OR Nursing (Signed)
Patient states that he ate cereal with milk at 1030.  Anesthesia and surgeon made aware.

## 2019-04-30 NOTE — Op Note (Signed)
Trevor Williams, SARTINI MEDICAL RECORD [id number] ACCOUNT [id number] DATE OF BIRTH:[birth year] FACILITY: [facility] LOCATION: [facility] PHYSICIAN:MATTHEW Marian Sorrow, MD  OPERATIVE REPORT  DATE OF PROCEDURE:  04/30/2019  PREOPERATIVE DIAGNOSIS:  Nondisplaced right intertrochanteric femur fracture.  POSTOPERATIVE DIAGNOSIS:  Nondisplaced right intertrochanteric femur fracture.  PROCEDURE:  Open reduction internal fixation of right intertrochanteric femur fracture utilizing a Biomet Affixus nail, 11 x 180 mm, with 130-degree lag screw.  SURGEON:  Paralee Cancel, MD  ASSISTANT:  Griffith Citron, PA-C.  Note that [name] was present for the entirety of the case from preoperative positioning, perioperative management of the operative extremity, general facilitation of the case and primary wound closure.  ANESTHESIA:  General including the placement of the arterial line due to his history of congestive heart failure.  ESTIMATED BLOOD LOSS:  Less  than 50 mL.  COMPLICATIONS:  None.  INDICATIONS:  The patient is a 59 year old male with multiple medical conditions, including chronic alcohol abuse, history of heart failure with last known ejection fraction of 15% from 07/2018.  He presented to the emergency room after a series of falls  with right hip pain.  Initial radiographs indicated no evidence of acute injury, but due to his pain, a  CT scan was ordered that revealed a nondisplaced fracture through the posterior aspect of the proximal femur in an intertrochanteric orientation.   He was admitted to the hospitalist service.  Cardiology was consulted for clearance and a repeat echocardiogram performed.  This revealed an improved ejection fraction of 25-30%.  The risks and benefits of the surgery were discussed.  The primary risks  at this point are to prevent further displacement with increased activity primarily due to falls and compliance issues.  The risks of infection and DVT were reviewed.  Consent  was obtained for benefit of pain management and fracture management as noted.  DESCRIPTION OF PROCEDURE:  The patient was brought to the operative theater.  Once adequate anesthesia was established and appropriate positioning and antibiotics with his arterial line in place,   the right leg was placed in a traction boot and his left  leg was placed into the well leg holder.  All bony prominences were padded and protected, particularly over the lateral aspect of the knee.  A timeout was performed, identifying the patient, the planned procedure and extremity.  The right hip was then  prepped and draped in sterile fashion.  Fluoroscopy was used to confirm that the fracture had not further reduced.  The leg was positioned and locked into place.  Using fluoroscopic imaging, we identified the tip of the trochanter.  Incision was then  made proximal to this.  The fascia of the gluteal muscle was incised and the guidewire was then inserted into the tip of the trochanter, into the proximal femur, across the fracture site.  I then opened up the proximal femur using a drill.  We then  passed the 11 x 180 mm nail by hand to its appropriate depth.  We then used the triple guide to insert a guidewire into the center of the head, confirmed in the AP and lateral planes.  I measured and selected a 105 mm lag screw.  We drilled for this and  placed the lag screw.  Based on the tightness of his femoral isthmus and proximal femur, I felt that he did not require distal interlock.  Once this was completed, the proximal locking bolt was locked and backed off a quarter turn to allow for  compression.  We then removed the jig.  The wounds were irrigated.  Final radiographs were obtained in AP and lateral plane.  The wound was closed in layers with #1 Vicryl in the gluteal fascia.  The remaining wound was closed with 2-0 Vicryl and running  Monocryl.  The hip was then cleaned, dried and dressed sterilely using Aquacel dressings.  The  patient was then taken to the recovery room in stable condition.  Note that I tried to contact whoever was available as for contact to let them know the procedure was over, but there was no answer and no way to leave a voicemail.  Postoperatively, he will be taken to the floor.  He will be weightbearing as tolerated with physical therapy.  We will use aspirin for DVT prophylaxis.  Follow up in the office in 2 weeks for wound check.  VN/NUANCE  D:04/30/2019 T:04/30/2019 JOB:[id number]

## 2019-04-30 NOTE — Anesthesia Postprocedure Evaluation (Signed)
Anesthesia Post Note  Patient: Trevor Williams  Procedure(s) Performed: INTRAMEDULLARY (IM) NAIL FEMORAL (Right )     Patient location during evaluation: PACU Anesthesia Type: General Level of consciousness: awake Pain management: pain level controlled Vital Signs Assessment: post-procedure vital signs reviewed and stable Respiratory status: spontaneous breathing, nonlabored ventilation, respiratory function stable and patient connected to nasal cannula oxygen Cardiovascular status: blood pressure returned to baseline and stable Postop Assessment: no apparent nausea or vomiting Anesthetic complications: no    Last Vitals:  Vitals:   04/30/19 1510 04/30/19 1614  BP: 130/73 131/88  Pulse: 100 93  Resp: 18 17  Temp: 36.9 C 37.3 C  SpO2: 96% 95%    Last Pain:  Vitals:   04/30/19 1645  TempSrc:   PainSc: 7                  Uthman Mroczkowski P Kedrick Mcnamee

## 2019-04-30 NOTE — Interval H&P Note (Signed)
History and Physical Interval Note:  04/30/2019 11:55 AM  Trevor Williams  has presented today for surgery, with the diagnosis of right intertroch fractue.  The various methods of treatment have been discussed with the patient and family. After consideration of risks, benefits and other options for treatment, the patient has consented to  Procedure(s): INTRAMEDULLARY (IM) NAIL FEMORAL (Right) as a surgical intervention.  The patient's history has been reviewed, patient examined, no change in status, stable for surgery.  I have reviewed the patient's chart and labs.  Questions were answered to the patient's satisfaction.     Mauri Pole

## 2019-04-30 NOTE — TOC Initial Note (Signed)
Transition of Care [facility]) - Initial/Assessment Note    Patient Details  Name: Trevor Williams MRN: [id number] Date of Birth: Feb 05, 1961  [facility] CM/SW Contact:    Lia Hopping, [place] Phone Number: 04/30/2019, 11:59 AM  Clinical Narrative:                 CSW reached out to the patient at bedside to introduce role and reason for visit. Patient reports he recently moved from [place], [place]. He has been living at [facility] for the past few months. Patient reports he applied for disability but was denied. The patient has not followed up since he learned about the denial. Patient reports he does not have any living relatives. Prior to admitting the patient was independent with ADL's. Patient is hopeful to rehab and "walk without hurting." Patient agreeable to rehab placement.  TOC staff will continue to follow this patient.   Expected Discharge Plan: Skilled Nursing Facility Barriers to Discharge: Continued Medical Work up, Other (comment)   Patient Goals and CMS Choice Patient states their goals for this hospitalization and ongoing recovery are:: "Walk without hurting " CMS Medicare.gov Compare Post Acute Care list provided to:: Patient Choice offered to / list presented to : Patient  Expected Discharge Plan and Services Expected Discharge Plan: [place] In-house Referral: Clinical Social Work Discharge Planning Services: CM Consult Post Acute Care Choice: [place] arrangements for the past 2 months: Homeless Shelter                                      Prior Living Arrangements/Services Living arrangements for the past 2 months: [place]   Patient language and need for interpreter reviewed:: No Do you feel safe going back to the place where you live?: Yes      Need for Family Participation in Patient Care: Yes (Comment) Care giver support system in place?: Yes (comment)   Criminal Activity/Legal  Involvement Pertinent to Current Situation/Hospitalization: No - Comment as needed  Activities of Daily Living Home Assistive Devices/Equipment: Cane (specify quad or straight) ADL Screening (condition at time of admission) Patient's cognitive ability adequate to safely complete daily activities?: Yes Is the patient deaf or have difficulty hearing?: No Does the patient have difficulty seeing, even when wearing glasses/contacts?: No Does the patient have difficulty concentrating, remembering, or making decisions?: Yes Patient able to express need for assistance with ADLs?: Yes Does the patient have difficulty dressing or bathing?: Yes Independently performs ADLs?: Yes (appropriate for developmental age) Does the patient have difficulty walking or climbing stairs?: Yes Weakness of Legs: Both Weakness of Arms/Hands: Both  Permission Sought/Granted   Permission granted to share information with : Yes, Verbal Permission Granted     Permission granted to share info w AGENCY: SNF        Emotional Assessment Appearance:: Appears stated age Attitude/Demeanor/Rapport: Engaged Affect (typically observed): Accepting Orientation: : Oriented to Self, Oriented to Place, Oriented to  Time, Oriented to Situation Alcohol / Substance Use: Not Applicable Psych Involvement: No (comment)  Admission diagnosis:  Hip fracture ([place]) [S72.009A] Closed nondisplaced intertrochanteric fracture of right femur, initial encounter ([place]) [id number] Alcoholic intoxication without complication ([place]) [id number] Patient Active Problem List   Diagnosis Date Noted  . Hip fracture ([place]) 04/29/2019  . Closed nondisplaced intertrochanteric fracture of right femur ([place])   . Alcoholic intoxication without complication ([place]) [id number]  .  Chronic systolic heart failure ([place]) 03/03/2019  . Elevated LFTs 03/03/2019  . Left lateral knee pain 03/03/2019  . Iron deficiency anemia due to chronic blood loss 03/03/2019  .  Hypomagnesemia 03/03/2019  . Skin lesion of face 03/03/2019  . GI bleed 01/26/2019  . Alcohol use 01/26/2019  . Homeless 01/26/2019  . Hypokalemia 01/26/2019  . Fall at home, initial encounter 01/26/2019  . Simple chronic bronchitis ([place]) 01/25/2015  . Tobacco use disorder 01/25/2015  . Chronic midline low back pain with sciatica 12/25/2014   PCP:  Elsie Stain, MD Pharmacy:   [facility] [place], [place] [address] [place] [address] Phone: [phone] Fax: [phone]  [id number] [place], [place] - [place] [id number] [place] [id number] Phone: [phone] Fax: [phone]  [facility] - [place], [place] - [address] [place] [address] Phone: [phone] Fax: [place], [place] [address] [place] [address] Phone: [phone] Fax: [place], [place] [address] Phone: [phone] Fax: [phone]     Social Determinants of Health (SDOH) Interventions    Readmission Risk Interventions No flowsheet data found.

## 2019-04-30 NOTE — Progress Notes (Signed)
   Subjective: Day of Surgery Procedure(s) (LRB): INTRAMEDULLARY (IM) NAIL FEMORAL (Right) Patient reports pain as moderate.   Patient seen in rounds for Dr. Alvan Dame. Patient is well, and has had no acute complaints or problems other than pain in the right hip. Plans for right hip ORIF with IM nail this afternoon with Dr. Alvan Dame.    Objective: Vital signs in last 24 hours: Temp:  [97.7 F (36.5 C)-99.1 F (37.3 C)] 99.1 F (37.3 C) (02/10 0803) Pulse Rate:  [77-101] 101 (02/10 0803) Resp:  [13-18] 18 (02/10 0522) BP: (128-137)/(79-91) 129/82 (02/10 0803) SpO2:  [93 %-96 %] 96 % (02/10 0803)  Intake/Output from previous day:  Intake/Output Summary (Last 24 hours) at 04/30/2019 1002 Last data filed at 04/30/2019 0600 Gross per 24 hour  Intake 3116 ml  Output 1000 ml  Net 2116 ml     Intake/Output this shift: No intake/output data recorded.  Labs: Recent Labs    04/28/19 1934 04/29/19 0150 04/30/19 0753  HGB 15.2 13.3 14.3   Recent Labs    04/29/19 0150 04/30/19 0753  WBC 6.7 7.4  RBC 3.71* 3.90*  HCT 41.4 42.6  PLT 75* 62*   Recent Labs    04/28/19 2056 04/29/19 0150  NA 142 141  K 4.0 4.7  CL 105 109  CO2 27 24  BUN 7 7  CREATININE 0.67 0.71  GLUCOSE 90 92  CALCIUM 9.1 8.1*   No results for input(s): LABPT, INR in the last 72 hours.  Exam: General - Patient is Alert and Oriented Extremity - Neurologically intact Sensation intact distally Intact pulses distally Dorsiflexion/Plantar flexion intact Motor Function - intact, moving foot and toes well on exam.   Past Medical History:  Diagnosis Date  . Abnormal liver function tests   . Acute respiratory failure ([place])    a. 07/2018 requiring intubation - CAP/CHF.  Marland Kitchen Alcohol abuse   . Biventricular heart failure ([place])   . Cancer ([place])    skin (left hand)  . Delirium    a. h/o delirium while admitted  . Emphysema of lung ([place])   . Homelessness   . Macrocytic anemia   . Mild CAD    a. cath 07/2018  60% ostial ramus otherwise OK.  . Mitral regurgitation   . NICM (nonischemic cardiomyopathy) ([place])   . Protein calorie malnutrition ([place])   . Tricuspid regurgitation     Assessment/Plan: Day of Surgery Procedure(s) (LRB): INTRAMEDULLARY (IM) NAIL FEMORAL (Right) Principal Problem:   Hip fracture ([facility]) Active Problems:   Homeless   Simple chronic bronchitis ([facility])   Tobacco use disorder   Chronic systolic heart failure ([facility])   Alcoholic intoxication without complication ([facility])  Estimated body mass index is 20.36 kg/m as calculated from the following:   Height as of this encounter: 5\' 7"  (1.702 m).   Weight as of this encounter: 59 kg.   Plans for right hip ORIF with IM nail this afternoon with Dr. Alvan Dame. Remain NPO. Non weightbearing for now. Continue pain management.   Griffith Citron, PA-C Orthopedic Surgery [phone] 04/30/2019, 10:02 AM

## 2019-04-30 NOTE — Progress Notes (Signed)
PROGRESS NOTE    Trevor Williams  TOI:[id number] DOB: [birth year] DOA: 04/28/2019 PCP: Elsie Stain, MD   Brief Narrative: 59 year old M with history of alcohol abuse/DTs, chronic systolic CHF/an ICM, history of respiratory failure due to pneumonia and CHF, status post intubation admitted with right hip pain after he tripped over a rock and fell on the right hip. In the ED CT head, neck no acute finding, CT abdomen pelvis showed intertrochanteric fracture right hip.  Blood work with alcohol level 330, platelet 80,000. Patient admitted for right hip intertrochanteric fracture  Subjective:  No acute events overnight.Afebrile. C/o pain on rt hip On RA No chest pain or shortness of breath  Assessment & Plan:   Right hip intertrochanteric fracture- 2/2 fall, for ORIF 2/10.Seen by cardiology and acceptable risk for surgery.Had CHF but compensated and euvolemic. Echo done and report pending.  Chronic congestive heart failure with systolic dysfunction/NICM: Euvolemic. Echo reviewed from 08/03/2025 LV moderately dilated with severe systolic dysfunction EF 15 to 25%, severely reduced RVEF.  At home on Coreg 3.125, Crestor 10 mg, Entresto 25-26.  Patient not aware of CHF it seems he was lost to follow-up from his discharge back in May 2020. Seen by cardiology currently euvolemic. TTE pending.  Alcohol abuse with alcohol intoxication with history of DTs, alcohol level 331;  continue on alcohol withdrawal protocol, thiamine and folate and IV fluids. Continue Librium taper starting at 25 mg tid  Chronic thrombocytopenia: Suspect in the setting of chronic alcohol abuse.  Monitor platelet count. They were as low as 32k in 07/2018, now at [place]. Transfuse as needed. Will monitor closely. Recent Labs  Lab 04/28/19 1934 04/29/19 0150 04/30/19 0753  PLT 80* 75* 62*   Chronic bronchitis: Doing well on room air.  Social situation: Homeless  COVID 19 NEG  Nutrition: Nutrition Problem: Increased  nutrient needs Etiology: social / environmental circumstances, hip fracture(ETOH abuse) Signs/Symptoms: estimated needs Interventions: Ensure Enlive (each supplement provides 350kcal and 20 grams of protein), MVI, Magic cup  Body mass index is 20.36 kg/m.   DVT prophylaxis: SCD Code Status:FULL Family Communication: plan of care discussed with patient at bedside. Disposition Plan:Patient is from: Homeless/insured       Anticipated d/c to: SNF       Barriers to d/c or conditions that needs to be met prior to d/c: awaiting for surgery  Consultants: ortho, cardiology  Procedures:see note Microbiology:see note Antimicrobials: Anti-infectives (From admission, onward)   None      Medications: Scheduled Meds: . carvedilol  3.125 mg Oral BID WC  . chlordiazePOXIDE  25 mg Oral TID  . chlordiazePOXIDE  25 mg Oral BID  . [START ON 05/02/2019] chlordiazePOXIDE  25 mg Oral Q0600  . feeding supplement (ENSURE ENLIVE)  237 mL Oral TID BM  . folic acid  1 mg Oral Daily  . gabapentin  300 mg Oral TID  . multivitamin with minerals  1 tablet Oral Daily  . pantoprazole  40 mg Oral Daily  . rosuvastatin  10 mg Oral q1800  . sacubitril-valsartan  1 tablet Oral BID  . sodium chloride flush  3 mL Intravenous Q12H  . thiamine  100 mg Oral Daily   Or  . thiamine  100 mg Intravenous Daily   Continuous Infusions: . sodium chloride    . sodium chloride 75 mL/hr at 04/30/19 0532    Objective: Vitals:   04/29/19 1355 04/29/19 1800 04/29/19 2104 04/30/19 0522  BP: (!) 135/91 134/81 128/79 137/80  Pulse: 87  89 93 77  Resp:  '13 18 18  '$ Temp: 97.7 F (36.5 C) 98.5 F (36.9 C) 99 F (37.2 C) 98.3 F (36.8 C)  TempSrc: Oral Oral Oral Oral  SpO2: 96% 93% 94% 94%  Weight:      Height:        Intake/Output Summary (Last 24 hours) at 04/30/2019 0730 Last data filed at 04/30/2019 0600 Gross per 24 hour  Intake 3361 ml  Output 1150 ml  Net 2211 ml   Filed Weights   04/28/19 1930 04/29/19  0123  Weight: 59 kg 59 kg   Weight change:   Body mass index is 20.36 kg/m.  Intake/Output from previous day: 02/09 0701 - 02/10 0700 In: 3361 [P.O.:1856; I.V.:1505] Out: 1150 [Urine:1150] Intake/Output this shift: No intake/output data recorded.  Examination:  General exam: AAOx3, NAD, ON RA HEENT:Oral mucosa moist, Ear/Nose WNL grossly, dentition normal. Respiratory system: B/L clear,no wheezing or crackles,no use of accessory muscle Cardiovascular system: S1 & S2 +, No JVD,. Gastrointestinal system: Abdomen soft, NT,ND, BS+ Nervous System:Alert, awake, moving extremities and grossly nonfocal Extremities: rt hip fracture site is tender,no edema, distal peripheral pulses palpable.  Skin: No rashes,no icterus. MSK: Normal muscle bulk,tone, power   Data Reviewed: I have personally reviewed following labs and imaging studies  CBC: Recent Labs  Lab 04/28/19 1934 04/29/19 0150  WBC 4.9 6.7  NEUTROABS 2.3  --   HGB 15.2 13.3  HCT 46.4 41.4  MCV 108.7* 111.6*  PLT 80* 75*   Basic Metabolic Panel: Recent Labs  Lab 04/28/19 2056 04/29/19 0132 04/29/19 0150  NA 142  --  141  K 4.0  --  4.7  CL 105  --  109  CO2 27  --  24  GLUCOSE 90  --  92  BUN 7  --  7  CREATININE 0.67  --  0.71  CALCIUM 9.1  --  8.1*  MG  --  2.0  --   PHOS  --  4.7*  --    GFR: Estimated Creatinine Clearance: 85 mL/min (by C-G formula based on SCr of 0.71 mg/dL). Liver Function Tests: Recent Labs  Lab 04/28/19 2056  AST 65*  ALT 31  ALKPHOS 99  BILITOT 0.6  PROT 7.8  ALBUMIN 3.9   No results for input(s): LIPASE, AMYLASE in the last 168 hours. No results for input(s): AMMONIA in the last 168 hours. Coagulation Profile: No results for input(s): INR, PROTIME in the last 168 hours. Cardiac Enzymes: No results for input(s): CKTOTAL, CKMB, CKMBINDEX, TROPONINI in the last 168 hours. BNP (last 3 results) No results for input(s): PROBNP in the last 8760 hours. HbA1C: No results for  input(s): HGBA1C in the last 72 hours. CBG: No results for input(s): GLUCAP in the last 168 hours. Lipid Profile: No results for input(s): CHOL, HDL, LDLCALC, TRIG, CHOLHDL, LDLDIRECT in the last 72 hours. Thyroid Function Tests: No results for input(s): TSH, T4TOTAL, FREET4, T3FREE, THYROIDAB in the last 72 hours. Anemia Panel: No results for input(s): VITAMINB12, FOLATE, FERRITIN, TIBC, IRON, RETICCTPCT in the last 72 hours. Sepsis Labs: No results for input(s): PROCALCITON, LATICACIDVEN in the last 168 hours.  Recent Results (from the past 240 hour(s))  SARS CORONAVIRUS 2 (TAT 6-24 HRS) Nasopharyngeal Nasopharyngeal Swab     Status: None   Collection Time: 04/28/19 10:57 PM   Specimen: Nasopharyngeal Swab  Result Value Ref Range Status   SARS Coronavirus 2 NEGATIVE NEGATIVE Final    Comment: (NOTE) SARS-CoV-2 target  nucleic acids are NOT DETECTED. The SARS-CoV-2 RNA is generally detectable in upper and lower respiratory specimens during the acute phase of infection. Negative results do not preclude SARS-CoV-2 infection, do not rule out co-infections with other pathogens, and should not be used as the sole basis for treatment or other patient management decisions. Negative results must be combined with clinical observations, patient history, and epidemiological information. The expected result is Negative. Fact Sheet for Patients: SugarRoll.be Fact Sheet for Healthcare Providers: https://www.woods-mathews.com/ This test is not yet approved or cleared by the [place] FDA and  has been authorized for detection and/or diagnosis of SARS-CoV-2 by FDA under an Emergency Use Authorization (EUA). This EUA will remain  in effect (meaning this test can be used) for the duration of the COVID-19 declaration under Section 56 4(b)(1) of the Act, 21 U.S.C. section 360bbb-3(b)(1), unless the authorization is terminated or revoked sooner. Performed  at [facility], [place] [address] [place], [place] [address]   Surgical pcr screen     Status: None   Collection Time: 04/29/19  9:26 PM   Specimen: Nasal Mucosa; Nasal Swab  Result Value Ref Range Status   MRSA, PCR NEGATIVE NEGATIVE Final   Staphylococcus aureus NEGATIVE NEGATIVE Final    Comment: (NOTE) The Xpert SA Assay (FDA approved for NASAL specimens in patients 65 years of age and older), is one component of a comprehensive surveillance program. It is not intended to diagnose infection nor to guide or monitor treatment. Performed at [facility], [place] [address], [place], [place] [address]       Radiology Studies: CT Head Wo Contrast  Result Date: 04/28/2019 CLINICAL DATA:  59 year old male with fall. EXAM: CT HEAD WITHOUT CONTRAST CT CERVICAL SPINE WITHOUT CONTRAST TECHNIQUE: Multidetector CT imaging of the head and cervical spine was performed following the standard protocol without intravenous contrast. Multiplanar CT image reconstructions of the cervical spine were also generated. COMPARISON:  Head CT dated 03/23/2019. 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 There is otherwise background of age-related atrophy and chronic microvascular ischemic changes. Incidental note of a cavum septum pellucidum and cavum vergae. There is no acute intracranial hemorrhage. No mass effect or midline shift. No extra-axial fluid collection. Vascular: No hyperdense vessel or unexpected calcification. Skull: Normal. Negative for fracture or focal lesion. Sinuses/Orbits: No acute finding. Other: None CT CERVICAL SPINE FINDINGS Alignment: No acute subluxation. Skull base and vertebrae: No acute fracture. Soft tissues and spinal canal: No prevertebral fluid or swelling. No visible canal hematoma. Disc levels: Multilevel degenerative changes with endplate irregularity and disc space narrowing and spurring. Upper chest: Large right  apical subpleural blebs. Other: None IMPRESSION: 1. No acute intracranial hemorrhage. 2. Age-related atrophy and chronic microvascular ischemic changes. Old right MCA territory infarct. 3. No acute/traumatic cervical spine pathology. Multilevel degenerative changes. Electronically Signed   By: Anner Crete M.D.   On: 04/28/2019 22:22   CT Cervical Spine Wo Contrast  Result Date: 04/28/2019 CLINICAL DATA:  59 year old male with fall. EXAM: CT HEAD WITHOUT CONTRAST CT CERVICAL SPINE WITHOUT CONTRAST TECHNIQUE: Multidetector CT imaging of the head and cervical spine was performed following the standard protocol without intravenous contrast. Multiplanar CT image reconstructions of the cervical spine were also generated. COMPARISON:  Head CT dated 03/23/2019. 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 There is otherwise background of age-related atrophy and chronic microvascular ischemic changes. Incidental note of a cavum  septum pellucidum and cavum vergae. There is no acute intracranial hemorrhage. No mass effect or midline shift. No extra-axial fluid collection. Vascular: No hyperdense vessel or unexpected calcification. Skull: Normal. Negative for fracture or focal lesion. Sinuses/Orbits: No acute finding. Other: None CT CERVICAL SPINE FINDINGS Alignment: No acute subluxation. Skull base and vertebrae: No acute fracture. Soft tissues and spinal canal: No prevertebral fluid or swelling. No visible canal hematoma. Disc levels: Multilevel degenerative changes with endplate irregularity and disc space narrowing and spurring. Upper chest: Large right apical subpleural blebs. Other: None IMPRESSION: 1. No acute intracranial hemorrhage. 2. Age-related atrophy and chronic microvascular ischemic changes. Old right MCA territory infarct. 3. No acute/traumatic cervical spine pathology. Multilevel degenerative changes. Electronically Signed   By: Anner Crete M.D.    On: 04/28/2019 22:22   CT PELVIS WO CONTRAST  Result Date: 04/28/2019 CLINICAL DATA:  Right hip pain after injury EXAM: CT PELVIS WITHOUT CONTRAST TECHNIQUE: Multidetector CT imaging of the pelvis was performed following the standard protocol without intravenous contrast. COMPARISON:  Radiograph February 20, 2019 FINDINGS: Urinary Tract: The visualized distal ureters and bladder appear unremarkable. Bowel: No bowel wall thickening, distention or surrounding inflammation identified within the pelvis. Scattered colonic diverticula are noted without diverticulitis. Vascular/Lymphatic: No enlarged pelvic lymph nodes identified. Scattered aortic atherosclerotic calcifications are seen without aneurysmal dilatation. Reproductive:  The prostate is unremarkable. Other: Mild soft tissue swelling seen over the right gluteal musculature. No soft tissue hematoma seen. Musculoskeletal: There is a healed fracture deformity seen at the anterior right iliac wing with small heterotopic ossification. There is a nondisplaced right intratrochanteric fracture extending through the superior portion of the greater trochanter. The femoral head is still well seated within the acetabulum. There is moderate bilateral hip osteoarthritis. Degenerative changes seen in the lower lumbar spine. There is heterogeneous subtle enlargement of the posterior right gluteal musculature, likely intramuscular edema. IMPRESSION: Acute nondisplaced right intratrochanteric fracture extending through the superior posterior greater trochanter. Right gluteal musculature edema and overlying soft tissue swelling. Prior healed fracture deformity of the anterior right iliac wing with heterotopic ossification. Electronically Signed   By: Prudencio Pair M.D.   On: 04/28/2019 22:23   DG Hips Bilat W or Wo Pelvis 3-4 Views  Result Date: 04/28/2019 CLINICAL DATA:  [place] from standing, right hip pain EXAM: DG HIP (WITH OR WITHOUT PELVIS) 3-4V BILAT COMPARISON:  02/20/2019  FINDINGS: Frontal view of the pelvis as well as frontal and frogleg lateral views of the right hip are obtained. Cassette artifact limits evaluation. There are numerous skin folds overlying the right hip. However, there is a subtle lucency perpendicular to the trabecular markings in the inter trochanteric region of the central aspect of the proximal right femur, seen on both the frontal and frogleg lateral views. I cannot exclude a nondisplaced fracture, and CT or MRI may be useful for further evaluation. No other acute displaced fractures. The hips are well aligned. There remainder of the bony pelvis is normal. IMPRESSION: 1. Possible nondisplaced intertrochanteric right hip fracture as above. CT or MRI recommended for further evaluation. Electronically Signed   By: Randa Ngo M.D.   On: 04/28/2019 20:16     LOS: 1 day   Time spent: More than 50% of that time was spent in counseling and/or coordination of care.  Antonieta Pert, MD [facility]  04/30/2019, 7:30 AM

## 2019-04-30 NOTE — Brief Op Note (Signed)
04/28/2019 - 04/30/2019  11:56 AM  PATIENT:  Trevor Williams  59 y.o. male  PRE-OPERATIVE DIAGNOSIS:  right intertrochanteric hip fracture  POST-OPERATIVE DIAGNOSIS:  right intertrochanteric hip fracture  PROCEDURE:  Procedure(s): INTRAMEDULLARY (IM) NAIL FEMORAL (Right)  SURGEON:  Surgeon(s) and Role:    Paralee Cancel, MD - Primary  PHYSICIAN ASSISTANT: Griffith Citron, PA-C  ANESTHESIA:   general  EBL:  <50 cc  BLOOD ADMINISTERED:none  DRAINS: none   LOCAL MEDICATIONS USED:  NONE  SPECIMEN:  No Specimen  DISPOSITION OF SPECIMEN:  N/A  COUNTS:  YES  TOURNIQUET:  * No tourniquets in log *  DICTATION: .Other Dictation: Dictation Number [id number]  PLAN OF CARE: Admit to inpatient   PATIENT DISPOSITION:  PACU - hemodynamically stable.   Delay start of Pharmacological VTE agent (>24hrs) due to surgical blood loss or risk of bleeding: no

## 2019-04-30 NOTE — Progress Notes (Signed)
Pt left the unit in bed accompanied by RN for surgery.

## 2019-04-30 NOTE — Progress Notes (Signed)
Pt requesting to go outside to smoke, Probation officer calmly explained that the pt is in the hospital and offered nicotine patch. MD is paged and new orders received.

## 2019-04-30 NOTE — Anesthesia Procedure Notes (Signed)
Procedure Name: Intubation Date/Time: 04/30/2019 1:00 PM Performed by: Silas Sacramento, CRNA Pre-anesthesia Checklist: Patient identified, Emergency Drugs available, Suction available and Patient being monitored Patient Re-evaluated:Patient Re-evaluated prior to induction Oxygen Delivery Method: Circle system utilized Preoxygenation: Pre-oxygenation with 100% oxygen Induction Type: IV induction, Rapid sequence and Cricoid Pressure applied Laryngoscope Size: Mac and 4 Grade View: Grade I Tube type: Oral Tube size: 7.5 mm Number of attempts: 1 Airway Equipment and Method: Stylet and Oral airway Placement Confirmation: ETT inserted through vocal cords under direct vision,  positive ETCO2 and breath sounds checked- equal and bilateral Secured at: 22 cm Tube secured with: Tape Dental Injury: Teeth and Oropharynx as per pre-operative assessment

## 2019-04-30 NOTE — Progress Notes (Signed)
Echocardiogram 2D Echocardiogram has been performed.  Trevor Williams 04/30/2019, 8:41 AM

## 2019-04-30 NOTE — H&P (View-Only) (Signed)
   Subjective: Day of Surgery Procedure(s) (LRB): INTRAMEDULLARY (IM) NAIL FEMORAL (Right) Patient reports pain as moderate.   Patient seen in rounds for Dr. Alvan Dame. Patient is well, and has had no acute complaints or problems other than pain in the right hip. Plans for right hip ORIF with IM nail this afternoon with Dr. Alvan Dame.    Objective: Vital signs in last 24 hours: Temp:  [97.7 F (36.5 C)-99.1 F (37.3 C)] 99.1 F (37.3 C) (02/10 0803) Pulse Rate:  [77-101] 101 (02/10 0803) Resp:  [13-18] 18 (02/10 0522) BP: (128-137)/(79-91) 129/82 (02/10 0803) SpO2:  [93 %-96 %] 96 % (02/10 0803)  Intake/Output from previous day:  Intake/Output Summary (Last 24 hours) at 04/30/2019 1002 Last data filed at 04/30/2019 0600 Gross per 24 hour  Intake 3116 ml  Output 1000 ml  Net 2116 ml     Intake/Output this shift: No intake/output data recorded.  Labs: Recent Labs    04/28/19 1934 04/29/19 0150 04/30/19 0753  HGB 15.2 13.3 14.3   Recent Labs    04/29/19 0150 04/30/19 0753  WBC 6.7 7.4  RBC 3.71* 3.90*  HCT 41.4 42.6  PLT 75* 62*   Recent Labs    04/28/19 2056 04/29/19 0150  NA 142 141  K 4.0 4.7  CL 105 109  CO2 27 24  BUN 7 7  CREATININE 0.67 0.71  GLUCOSE 90 92  CALCIUM 9.1 8.1*   No results for input(s): LABPT, INR in the last 72 hours.  Exam: General - Patient is Alert and Oriented Extremity - Neurologically intact Sensation intact distally Intact pulses distally Dorsiflexion/Plantar flexion intact Motor Function - intact, moving foot and toes well on exam.   Past Medical History:  Diagnosis Date  . Abnormal liver function tests   . Acute respiratory failure ([place])    a. 07/2018 requiring intubation - CAP/CHF.  Marland Kitchen Alcohol abuse   . Biventricular heart failure ([place])   . Cancer ([place])    skin (left hand)  . Delirium    a. h/o delirium while admitted  . Emphysema of lung ([place])   . Homelessness   . Macrocytic anemia   . Mild CAD    a. cath 07/2018  60% ostial ramus otherwise OK.  . Mitral regurgitation   . NICM (nonischemic cardiomyopathy) ([place])   . Protein calorie malnutrition ([place])   . Tricuspid regurgitation     Assessment/Plan: Day of Surgery Procedure(s) (LRB): INTRAMEDULLARY (IM) NAIL FEMORAL (Right) Principal Problem:   Hip fracture (H[facility]) Active Problems:   Homeless   Simple chronic bronchitis (HCC)   Tobacco use disorder   Chronic systolic heart failure (HCC)   Alcoholic intoxication without complication (HCC)  Estimated body mass index is 20.36 kg/m as calculated from the following:   Height as of this encounter: 5\' 7"  (1.702 m).   Weight as of this encounter: 59 kg.   Plans for right hip ORIF with IM nail this afternoon with Dr. Alvan Dame. Remain NPO. Non weightbearing for now. Continue pain management.   Griffith Citron, PA-C Orthopedic Surgery [phone] 04/30/2019, 10:02 AM

## 2019-04-30 NOTE — Anesthesia Procedure Notes (Signed)
Arterial Line Insertion Start/End2/12/2019 12:42 PM, 04/30/2019 12:48 PM Performed by: Freddrick March, MD, Silas Sacramento, CRNA, anesthesiologist  Patient location: Pre-op. Preanesthetic checklist: patient identified, IV checked, site marked, risks and benefits discussed, surgical consent, monitors and equipment checked, pre-op evaluation, timeout performed and anesthesia consent Lidocaine 1% used for infiltration and patient sedated Right, radial was placed Catheter size: 20 G Hand hygiene performed , maximum sterile barriers used  and Seldinger technique used Allen's test indicative of satisfactory collateral circulation Attempts: 1 Procedure performed without using ultrasound guided technique. Ultrasound Notes:anatomy identified, needle tip was noted to be adjacent to the nerve/plexus identified and no ultrasound evidence of intravascular and/or intraneural injection Following insertion, dressing applied. Post procedure assessment: normal  Post procedure complications: local hematoma. Patient tolerated the procedure well with no immediate complications. Additional procedure comments: Attempted x1 with flashback, but difficulty achieving blood return. Wire advanced over catheter and Seldinger technique performed by Lanetta Inch, MD. With traction, A-line was patent, with appropriate waveform and easy blood return. Marland Kitchen

## 2019-04-30 NOTE — Transfer of Care (Signed)
Immediate Anesthesia Transfer of Care Note  Patient: Trevor Williams  Procedure(s) Performed: INTRAMEDULLARY (IM) NAIL FEMORAL (Right )  Patient Location: PACU  Anesthesia Type:General  Level of Consciousness: awake, oriented and patient cooperative  Airway & Oxygen Therapy: Patient Spontanous Breathing and Patient connected to face mask oxygen  Post-op Assessment: Report given to RN and Post -op Vital signs reviewed and stable  Post vital signs: Reviewed and stable  Last Vitals:  Vitals Value Taken Time  BP 134/94 04/30/19 1406  Temp 37 C 04/30/19 1406  Pulse 104 04/30/19 1414  Resp 16 04/30/19 1414  SpO2 100 % 04/30/19 1414  Vitals shown include unvalidated device data.  Last Pain:  Vitals:   04/30/19 1406  TempSrc:   PainSc: 8       Patients Stated Pain Goal: 3 ([id number])  Complications: No apparent anesthesia complications

## 2019-05-01 ENCOUNTER — Encounter: Payer: Self-pay | Admitting: *Deleted

## 2019-05-01 LAB — CBC
HCT: 41.9 % (ref 39.0–52.0)
Hemoglobin: 14 g/dL (ref 13.0–17.0)
MCH: 36.5 pg — ABNORMAL HIGH (ref 26.0–34.0)
MCHC: 33.4 g/dL (ref 30.0–36.0)
MCV: 109.1 fL — ABNORMAL HIGH (ref 80.0–100.0)
Platelets: 67 10*3/uL — ABNORMAL LOW (ref 150–400)
RBC: 3.84 MIL/uL — ABNORMAL LOW (ref 4.22–5.81)
RDW: 12.5 % (ref 11.5–15.5)
WBC: 7.4 10*3/uL (ref 4.0–10.5)
nRBC: 0 % (ref 0.0–0.2)

## 2019-05-01 LAB — BASIC METABOLIC PANEL
Anion gap: 9 (ref 5–15)
BUN: 11 mg/dL (ref 6–20)
CO2: 23 mmol/L (ref 22–32)
Calcium: 8.3 mg/dL — ABNORMAL LOW (ref 8.9–10.3)
Chloride: 103 mmol/L (ref 98–111)
Creatinine, Ser: 0.72 mg/dL (ref 0.61–1.24)
GFR calc Af Amer: >60 mL/min (ref >60)
GFR calc non Af Amer: >60 mL/min (ref >60)
Glucose, Bld: 118 mg/dL — ABNORMAL HIGH (ref 70–99)
Potassium: 4.1 mmol/L (ref 3.5–5.1)
Sodium: 135 mmol/L (ref 135–145)

## 2019-05-01 MED ORDER — FERROUS SULFATE 325 (65 FE) MG PO TABS: 325.0000 mg | ORAL_TABLET | Freq: Three times a day (TID) | ORAL | 0 refills | Status: DC

## 2019-05-01 MED ORDER — METHOCARBAMOL 500 MG PO TABS: 500.0000 mg | ORAL_TABLET | Freq: Four times a day (QID) | ORAL | 0 refills | Status: DC | PRN

## 2019-05-01 MED ORDER — HYDROCODONE-ACETAMINOPHEN 5-325 MG PO TABS: 1.0000 | ORAL_TABLET | Freq: Four times a day (QID) | ORAL | 0 refills | Status: DC | PRN

## 2019-05-01 MED ORDER — ASPIRIN 81 MG PO TBEC: 81.0000 mg | DELAYED_RELEASE_TABLET | Freq: Two times a day (BID) | ORAL | 0 refills | Status: AC

## 2019-05-01 MED FILL — METHOCARBAMOL 500 MG TABS: 500 | 10 days supply | Qty: 40 | Fill #0

## 2019-05-01 NOTE — Progress Notes (Addendum)
Progress Note  Patient Name: Trevor Williams Date of Encounter: 05/01/2019  Primary Cardiologist: Loralie Champagne, MD   Subjective   No chest pain or shortness of breath.  Inpatient Medications    Scheduled Meds: . acetaminophen  500 mg Oral Q6H  . aspirin EC  81 mg Oral BID  . carvedilol  3.125 mg Oral BID WC  . chlordiazePOXIDE  25 mg Oral BID  . [START ON 05/02/2019] chlordiazePOXIDE  25 mg Oral Q0600  . docusate sodium  100 mg Oral BID  . feeding supplement (ENSURE ENLIVE)  237 mL Oral TID BM  . ferrous sulfate  325 mg Oral TID PC  . folic acid  1 mg Oral Daily  . gabapentin  300 mg Oral TID  . multivitamin with minerals  1 tablet Oral Daily  . nicotine  14 mg Transdermal Daily  . pantoprazole  40 mg Oral Daily  . rosuvastatin  10 mg Oral q1800  . sacubitril-valsartan  1 tablet Oral BID  . sodium chloride flush  3 mL Intravenous Q12H  . thiamine  100 mg Oral Daily   Or  . thiamine  100 mg Intravenous Daily   Continuous Infusions: . sodium chloride    . sodium chloride 75 mL/hr at 05/01/19 0016  . methocarbamol (ROBAXIN) IV     PRN Meds: sodium chloride, alum & mag hydroxide-simeth, bisacodyl, HYDROcodone-acetaminophen, HYDROcodone-acetaminophen, LORazepam **OR** LORazepam, menthol-cetylpyridinium **OR** phenol, methocarbamol **OR** methocarbamol (ROBAXIN) IV, metoCLOPramide **OR** metoCLOPramide (REGLAN) injection, morphine injection, ondansetron **OR** ondansetron (ZOFRAN) IV, polyethylene glycol, sodium chloride flush   Vital Signs    Vitals:   04/30/19 1808 04/30/19 2252 05/01/19 0651 05/01/19 0906  BP: 128/84 132/88 131/84 123/73  Pulse: 95 90 (!) 57 86  Resp: 16 19 19    Temp: 98.4 F (36.9 C) 98.5 F (36.9 C) (!) 97.4 F (36.3 C)   TempSrc: Oral Oral Oral   SpO2: 95% 93% 100%   Weight:      Height:        Intake/Output Summary (Last 24 hours) at 05/01/2019 1010 Last data filed at 05/01/2019 0600 Gross per 24 hour  Intake 2022.05 ml  Output 525 ml    Net 1497.05 ml   Last 3 Weights 04/29/2019 04/28/2019 03/22/2019  Weight (lbs) 130 lb 130 lb 130 lb  Weight (kg) 58.968 kg 58.968 kg 58.968 kg      Telemetry    Not on telemetry  ECG    Not applicable  Physical Exam   GEN: No acute distress.   Neck: No JVD Cardiac: RRR, no murmurs, rubs, or gallops.  Respiratory: Clear to auscultation bilaterally. GI: Soft, nontender, non-distended  MS: No edema; No deformity. Neuro:  Nonfocal  Psych: Normal affect   Labs    Chemistry Recent Labs  Lab 04/28/19 2056 04/29/19 0150 05/01/19 0438  NA 142 141 135  K 4.0 4.7 4.1  CL 105 109 103  CO2 27 24 23   GLUCOSE 90 92 118*  BUN 7 7 11   CREATININE 0.67 0.71 0.72  CALCIUM 9.1 8.1* 8.3*  PROT 7.8  --   --   ALBUMIN 3.9  --   --   AST 65*  --   --   ALT 31  --   --   ALKPHOS 99  --   --   BILITOT 0.6  --   --   GFRNONAA >60 >60 >60  GFRAA >60 >60 >60  ANIONGAP 10 8 9      Hematology Recent  Labs  Lab 04/29/19 0150 04/30/19 0753 05/01/19 0438  WBC 6.7 7.4 7.4  RBC 3.71* 3.90* 3.84*  HGB 13.3 14.3 14.0  HCT 41.4 42.6 41.9  MCV 111.6* 109.2* 109.1*  MCH 35.8* 36.7* 36.5*  MCHC 32.1 33.6 33.4  RDW 13.0 12.7 12.5  PLT 75* 62* 67*    BNP Recent Labs  Lab 04/29/19 0150  BNP 163.2*     Radiology    DG C-Arm 1-60 Min-No Report  Result Date: 04/30/2019 Fluoroscopy was utilized by the requesting physician.  No radiographic interpretation.   ECHOCARDIOGRAM COMPLETE  Result Date: 04/30/2019    ECHOCARDIOGRAM REPORT   Patient Name:   Trevor Williams Date of Exam: 04/30/2019 Medical Rec #:  [id number]     Height:       67.0 in Accession #:    [id number]    Weight:       130.0 lb Date of Birth:  06-10-1960     BSA:          1.68 m Patient Age:    57 years      BP:           137/80 mmHg Patient Gender: M             HR:           97 bpm. Exam Location:  Inpatient Procedure: 2D Echo, Color Doppler and Cardiac Doppler Indications:    [id number] Acute diastolic (congestive) heart failure   History:        Patient has prior history of Echocardiogram examinations, most                 recent 08/07/2018. CHF.  Sonographer:    Raquel Sarna Senior RDCS Referring Phys: [id number] [facility]  Sonographer Comments: Technically difficult due to rib shadowing, patient unable to turn due to hip fracture. IMPRESSIONS  1. LV endocardial borders are poorly visualized and cannot rule out focal wall motion abonrmalities on this study. LVF appears at least mlidly reduced. Recommend definity contrast study to get accurate assessment of wall motion and EF. The left ventrical is not well visualized. The left ventricular internal cavity size was moderately dilated. Left ventricular diastolic parameters are consistent with Grade I diastolic dysfunction (impaired relaxation).  2. Right ventricular systolic function is normal. The right ventricular size is normal. There is normal pulmonary artery systolic pressure.  3. Right atrial size was mildly dilated.  4. The mitral valve is normal in structure and function. no evidence of mitral valve regurgitation. No evidence of mitral stenosis.  5. The aortic valve is tricuspid. Aortic valve regurgitation is not visualized. Mild aortic valve sclerosis is present, with no evidence of aortic valve stenosis.  6. The inferior vena cava is normal in size with <50% respiratory variability, suggesting right atrial pressure of 8 mmHg. Conclusion(s)/Recomendation(s): Poor windows for evaluation of left ventricular function by transthoracic echocardiography. Would recommend definity contrast study to get accurate assessment of wall motion and EF. FINDINGS  Left Ventricle: LV endocardial borders are poorly visualized and cannot rule out focal wall motio n abonrmalities on this study. LVF appears at least mlidly reduced. Recommend definity contrast study to get accurate assessment of wall motion and EF.The left ventricle is not well visualized. The left ventricular internal cavity size was moderately  dilated. There is no left ventricular hypertrophy. Left ventricular diastolic parameters are consistent with Grade I diastolic dysfunction (impaired relaxation). Right Ventricle: The right ventricular size is normal. No increase in right ventricular  wall thickness. Right ventricular systolic function is normal. There is normal pulmonary artery systolic pressure. The tricuspid regurgitant velocity is 2.25 m/s, and  with an assumed right atrial pressure of 8 mmHg, the estimated right ventricular systolic pressure is [id number] mmHg. Left Atrium: Left atrial size was normal in size. Right Atrium: Right atrial size was mildly dilated. Pericardium: There is no evidence of pericardial effusion. Mitral Valve: The mitral valve is normal in structure and function. There is mild thickening of the mitral valve leaflet(s). Normal mobility of the mitral valve leaflets. Mild mitral annular calcification. No evidence of mitral valve regurgitation. No evidence of mitral valve stenosis. Tricuspid Valve: The tricuspid valve is normal in structure. Tricuspid valve regurgitation is trivial. No evidence of tricuspid stenosis. Aortic Valve: The aortic valve is tricuspid. Aortic valve regurgitation is not visualized. Mild aortic valve sclerosis is present, with no evidence of aortic valve stenosis. Pulmonic Valve: The pulmonic valve was normal in structure. Pulmonic valve regurgitation is not visualized. No evidence of pulmonic stenosis. Aorta: The aortic root is normal in size and structure. Venous: The inferior vena cava is normal in size with less than 50% respiratory variability, suggesting right atrial pressure of 8 mmHg. The inferior vena cava and the hepatic vein show a normal flow pattern. IAS/Shunts: The interatrial septum appears to be lipomatous. No atrial level shunt detected by color flow Doppler.  LEFT VENTRICLE PLAX 2D LVIDd:         6.00 cm       Diastology LVIDs:         4.90 cm       LV e' lateral:   8.27 cm/s LV PW:          0.70 cm       LV E/e' lateral: 4.2 LV IVS:        0.70 cm       LV e' medial:    7.18 cm/s LVOT diam:     2.10 cm       LV E/e' medial:  4.9 LV SV:         46.41 ml LV SV Index:   40.34 LVOT Area:     3.46 cm  LV Volumes (MOD) LV area d, A2C:    25.20 cm LV area d, A4C:    30.80 cm LV area s, A2C:    22.40 cm LV area s, A4C:    24.20 cm LV major d, A2C:   7.44 cm LV major d, A4C:   8.30 cm LV major s, A2C:   7.59 cm LV major s, A4C:   7.64 cm LV vol d, MOD A2C: 73.0 ml LV vol d, MOD A4C: 94.9 ml LV vol s, MOD A2C: 56.9 ml LV vol s, MOD A4C: 64.8 ml LV SV MOD A2C:     16.1 ml LV SV MOD A4C:     94.9 ml LV SV MOD BP:      26.9 ml RIGHT VENTRICLE RV S prime:     14.50 cm/s TAPSE (M-mode): 1.9 cm LEFT ATRIUM             Index       RIGHT ATRIUM           Index LA diam:        3.10 cm 1.84 cm/m  RA Area:     19.00 cm LA Vol (A2C):   50.0 ml 29.69 ml/m RA Volume:   55.80 ml  33.14 ml/m LA Vol (A4C):  55.2 ml 32.78 ml/m LA Biplane Vol: 52.9 ml 31.41 ml/m  AORTIC VALVE LVOT Vmax:   79.60 cm/s LVOT Vmean:  59.600 cm/s LVOT VTI:    0.134 m  AORTA Ao Root diam: 3.50 cm MITRAL VALVE                        TRICUSPID VALVE MV Area (PHT): 3.60 cm             TR Peak grad:   20.2 mmHg MV Decel Time: 211 msec             TR Vmax:        225.00 cm/s MV E velocity: 35.10 cm/s 103 cm/s MV A velocity: 60.00 cm/s 70.3 cm/s SHUNTS MV E/A ratio:  0.59       1.5       Systemic VTI:  0.13 m                                     Systemic Diam: 2.10 cm Fransico Him MD Electronically signed by Fransico Him MD Signature Date/Time: 04/30/2019/12:18:27 PM    Final    DG HIP OPERATIVE UNILAT WITH PELVIS RIGHT  Result Date: 04/30/2019 CLINICAL DATA:  Right hip fracture. EXAM: OPERATIVE right HIP (WITH PELVIS IF PERFORMED) 3 VIEWS TECHNIQUE: Fluoroscopic spot image(s) were submitted for interpretation post-operatively. Radiation exposure index: 3.8955 mGy. COMPARISON:  None. FINDINGS: Three intraoperative fluoroscopic images of the  right hip demonstrate the patient to be status post internal fixation of nondisplaced intertrochanteric fracture of proximal right femur. Intramedullary rod fixation is noted as well of the right femur. IMPRESSION: Status post internal fixation of proximal right femoral intertrochanteric fracture. Electronically Signed   By: Marijo Conception M.D.   On: 04/30/2019 13:57   ECHOCARDIOGRAM LIMITED  Result Date: 04/30/2019    ECHOCARDIOGRAM LIMITED REPORT   Patient Name:   Taaj Carr Date of Exam: 04/30/2019 Medical Rec #:  [id number]     Height:       67.0 in Accession #:    [id number]    Weight:       130.0 lb Date of Birth:  Apr 17, 1960     BSA:          1.68 m Patient Age:    59 years      BP:           145/96 mmHg Patient Gender: M             HR:           75 bpm. Exam Location:  Inpatient Procedure: 2D Echo and Intracardiac Opacification Agent Indications:    Evaluation LV function  History:        Patient has prior history of Echocardiogram examinations, most                 recent 04/29/2018.  Sonographer:    Raquel Sarna Senior RDCS Referring Phys: [place] Comments: IMPRESSIONS  1. Poor definity contrast study. LVF appears better in the apical views than in the parasternal views. Overall EF is estimated at 30% with global hypokinesis. Consider cardiac MRI if indicated to get accurate EF. FINDINGS  Left Ventricle: Poor definity contrast study. LVF appears better in the apical views than in the parasternal views. Overall EF is estimated at 30% with global hypokinesis. Consider cardiac MRI if  indicated to get accurate EF. Definity contrast agent was  given IV to delineate the left ventricular endocardial borders. Fransico Him MD Electronically signed by Fransico Him MD Signature Date/Time: 05-01-19/1:36:07 PM    Final     Cardiac Studies   Limited echocardiogram 05/01/19  IMPRESSIONS  1. Poor definity contrast study. LVF appears better in the apical views than in the parasternal  views. Overall EF is estimated at 30% with global hypokinesis. Consider cardiac MRI if indicated to get accurate EF. FINDINGS  Left Ventricle: Poor definity contrast study. LVF appears better in the apical views than in the parasternal views. Overall EF is estimated at 30% with global hypokinesis. Consider cardiac MRI if indicated to get accurate EF. Definity contrast agent was  given IV to delineate the left ventricular endocardial borders.   Patient Profile     59 y.o. male with a hx of homelessness, alcohol abuse, chronic systolic heart failure 2nd to NICM, hx of GI bleed 01/2019, COPD, tobacco use, and self-reported liver cirrhosis seen for surgical clearance.   Assessment & Plan    1.  Chronic systolic heart failure/nonischemic cardiomyopathy -heart cath 07/2018 reassuring without evidence for obstructive CAD -Echo this admission showed EF of 30% with global hypokinesis (improved from 15-20% on 07/2018) -Euvolemic on exam -Continue Entresto and Coreg -Watch volume overload with IV hydration  2.  Right hip fracture -S/p ORIF 04-30-22  3.  Alcohol and tobacco abuse -Recommended cessation  5.  Chronic thrombocytopenia - Platelets stable   [facility] will sign off.   Medication Recommendations: Continue Coreg 3.125 mg twice daily and Entresto 24/26 twice daily  other recommendations (labs, testing, etc):  None Follow up as an outpatient:  Has been arranged with CHF clinic @ 06/05/19 @ 11am  For questions or updates, please contact [place] Please consult [email or web address] for contact info under        Signed, Leanor Kail, PA  05/01/2019, 10:10 AM    History and all data above reviewed.  Patient examined.  I agree with the findings as above.   Net quite positive if the intake and output is correct.   The patient exam reveals COR:RRR  ,  Lungs: Clear  ,  Abd: Positive bowel sounds, no rebound no guarding, Ext No edema  .  All available labs, radiology testing, previous  records reviewed. Agree with documented assessment and plan. OK to continue current meds as he does not seem to be volume overloaded.    Jeneen Rinks Dao Memmott  11:46 AM  05/01/2019

## 2019-05-01 NOTE — Progress Notes (Signed)
PROGRESS NOTE    Trevor Williams  JKK:[id number] DOB: [birth year] DOA: 04/28/2019 PCP: Elsie Stain, MD   Brief Narrative: 59 year old M with history of alcohol abuse/DTs, chronic systolic CHF/an ICM, history of respiratory failure due to pneumonia and CHF, status post intubation admitted with right hip pain after he tripped over a rock and fell on the right hip. In the ED CT head, neck no acute finding, CT abdomen pelvis showed intertrochanteric fracture right hip.  Blood work with alcohol level 330, platelet 80,000. Patient admitted for right hip intertrochanteric fracture.  Underwent ORIF 2/10.   Subjective: Pain controlled.  No new complaints. No acute events overnight.  Afebrile and however noted fever of 101.7x1 during OR yesterday. Status post ORIF. For PT evaluation today. He accidentally pulled out right hip dressing in night and was reapplied this morning  Assessment & Plan:   Right hip intertrochanteric fracture- 2/2 fall, s/p ORIF 2/10-appreciate orthopedic surgery input.Seen by cardiology and acceptable risk for surgery.Had CHF but compensated and euvolemic.  Continue postop PT OT pain control, DVT prophylaxis as per  Orthopedic.  Chronic congestive heart failure with systolic dysfunction/NICM: Euvolemic. Echo reviewed from 08/03/2025 LV moderately dilated with severe systolic dysfunction EF 15 to 25%, severely reduced RVEF.   Good on this admission limited due to lack of definitive.  We will continue on his home regimen of Coreg 3.125, Crestor 10 mg, Entresto 25-26.  Patient not aware of CHF it seems he was lost to follow-up from his discharge back in May 2020. Seen by cardiology here.  Alcohol abuse with alcohol intoxication with history of DTs, alcohol level 331 on admission.  continue on alcohol withdrawal protocol w/ CIWA, cont  thiamine and folate and IV fluids. Continue Librium taper.  Chronic thrombocytopenia: Suspect in the setting of chronic alcohol abuse.  Monitor  platelet count. They were as low as 32k in 07/2018, Transfuse as needed. Will monitor closely. Recent Labs  Lab 04/28/19 1934 04/29/19 0150 04/30/19 0753 05/01/19 0438  PLT 80* 75* 62* 67*   Chronic bronchitis: Doing well on room air.  Tobacco abuse placed on nicotine patch.  Social situation: Homeless  COVID 19 NEG  Nutrition: Nutrition Problem: Increased nutrient needs Etiology: social / environmental circumstances, hip fracture(ETOH abuse) Signs/Symptoms: estimated needs Interventions: Ensure Enlive (each supplement provides 350kcal and 20 grams of protein), MVI, Magic cup  Body mass index is 20.36 kg/m.   DVT prophylaxis: SCD-aspirin 81 mg twice daily per Ortho. Code Status:FULL Family Communication: plan of care discussed with patient at bedside. Disposition Plan:Patient is from: Homeless/insured       Anticipated d/c to: SNF       Barriers to d/c or conditions that needs to be met prior to d/c: in postop course and SNF once okay from surgery standpoint, await pt/ot eval.   Consultants: ortho, cardiology  Procedures: 04/30/19 Open reduction internal fixation of right intertrochanteric femur fracture utilizing a Biomet Affixus nail, 11 x 180 mm, with 130-degree lag screw  Microbiology:see note Antimicrobials: Anti-infectives (From admission, onward)   Start     Dose/Rate Route Frequency Ordered Stop   04/30/19 1830  ceFAZolin (ANCEF) IVPB 2g/100 mL premix     2 g 200 mL/hr over 30 Minutes Intravenous Every 6 hours 04/30/19 1520 05/01/19 0054   04/30/19 1130  ceFAZolin (ANCEF) IVPB 2g/100 mL premix     2 g 200 mL/hr over 30 Minutes Intravenous On call to O.R. 04/30/19 1125 04/30/19 1259      Medications: Scheduled  Meds: . acetaminophen  500 mg Oral Q6H  . aspirin EC  81 mg Oral BID  . carvedilol  3.125 mg Oral BID WC  . chlordiazePOXIDE  25 mg Oral BID  . [START ON 05/02/2019] chlordiazePOXIDE  25 mg Oral Q0600  . docusate sodium  100 mg Oral BID  . feeding  supplement (ENSURE ENLIVE)  237 mL Oral TID BM  . ferrous sulfate  325 mg Oral TID PC  . folic acid  1 mg Oral Daily  . gabapentin  300 mg Oral TID  . multivitamin with minerals  1 tablet Oral Daily  . nicotine  14 mg Transdermal Daily  . pantoprazole  40 mg Oral Daily  . rosuvastatin  10 mg Oral q1800  . sacubitril-valsartan  1 tablet Oral BID  . sodium chloride flush  3 mL Intravenous Q12H  . thiamine  100 mg Oral Daily   Or  . thiamine  100 mg Intravenous Daily   Continuous Infusions: . sodium chloride    . sodium chloride 75 mL/hr at 05/01/19 0016  . methocarbamol (ROBAXIN) IV      Objective: Vitals:   04/30/19 1808 04/30/19 2252 05/01/19 0651 05/01/19 0906  BP: 128/84 132/88 131/84 123/73  Pulse: 95 90 (!) 57 86  Resp: '16 19 19   '$ Temp: 98.4 F (36.9 C) 98.5 F (36.9 C) (!) 97.4 F (36.3 C)   TempSrc: Oral Oral Oral   SpO2: 95% 93% 100%   Weight:      Height:        Intake/Output Summary (Last 24 hours) at 05/01/2019 0946 Last data filed at 05/01/2019 0600 Gross per 24 hour  Intake 2022.05 ml  Output 525 ml  Net 1497.05 ml   Filed Weights   04/28/19 1930 04/29/19 0123  Weight: 59 kg 59 kg   Weight change:   Body mass index is 20.36 kg/m.  Intake/Output from previous day: 02/10 0701 - 02/11 0700 In: 2022.1 [P.O.:280; I.V.:1542.1; IV Piggyback:200] Out: 525 [Urine:475; Blood:50] Intake/Output this shift: No intake/output data recorded.  Examination:  General exam: AAO X3, NAD, weak appearing. HEENT:Oral mucosa moist, Ear/Nose WNL grossly, dentition normal. Respiratory system: bilaterally clear,no wheezing or crackles,no use of accessory muscle Cardiovascular system: S1 & S2 +, No JVD,. Gastrointestinal system: Abdomen soft, NT,ND, BS+ Nervous System:Alert, awake, moving extremities and grossly nonfocal Extremities: No edema, distal peripheral pulses palpable.  Right hip surgical site with 2 dressing intact. Skin: No rashes,no icterus. MSK: Normal  muscle bulk,tone, power   Data Reviewed: I have personally reviewed following labs and imaging studies  CBC: Recent Labs  Lab 04/28/19 1934 04/29/19 0150 04/30/19 0753 05/01/19 0438  WBC 4.9 6.7 7.4 7.4  NEUTROABS 2.3  --   --   --   HGB 15.2 13.3 14.3 14.0  HCT 46.4 41.4 42.6 41.9  MCV 108.7* 111.6* 109.2* 109.1*  PLT 80* 75* 62* 67*   Basic Metabolic Panel: Recent Labs  Lab 04/28/19 2056 04/29/19 0132 04/29/19 0150 05/01/19 0438  NA 142  --  141 135  K 4.0  --  4.7 4.1  CL 105  --  109 103  CO2 27  --  24 23  GLUCOSE 90  --  92 118*  BUN 7  --  7 11  CREATININE 0.67  --  0.71 0.72  CALCIUM 9.1  --  8.1* 8.3*  MG  --  2.0  --   --   PHOS  --  4.7*  --   --  GFR: Estimated Creatinine Clearance: 85 mL/min (by C-G formula based on SCr of 0.72 mg/dL). Liver Function Tests: Recent Labs  Lab 04/28/19 2056  AST 65*  ALT 31  ALKPHOS 99  BILITOT 0.6  PROT 7.8  ALBUMIN 3.9   No results for input(s): LIPASE, AMYLASE in the last 168 hours. No results for input(s): AMMONIA in the last 168 hours. Coagulation Profile: No results for input(s): INR, PROTIME in the last 168 hours. Cardiac Enzymes: No results for input(s): CKTOTAL, CKMB, CKMBINDEX, TROPONINI in the last 168 hours. BNP (last 3 results) No results for input(s): PROBNP in the last 8760 hours. HbA1C: No results for input(s): HGBA1C in the last 72 hours. CBG: No results for input(s): GLUCAP in the last 168 hours. Lipid Profile: No results for input(s): CHOL, HDL, LDLCALC, TRIG, CHOLHDL, LDLDIRECT in the last 72 hours. Thyroid Function Tests: No results for input(s): TSH, T4TOTAL, FREET4, T3FREE, THYROIDAB in the last 72 hours. Anemia Panel: No results for input(s): VITAMINB12, FOLATE, FERRITIN, TIBC, IRON, RETICCTPCT in the last 72 hours. Sepsis Labs: No results for input(s): PROCALCITON, LATICACIDVEN in the last 168 hours.  Recent Results (from the past 240 hour(s))  SARS CORONAVIRUS 2 (TAT 6-24  HRS) Nasopharyngeal Nasopharyngeal Swab     Status: None   Collection Time: 04/28/19 10:57 PM   Specimen: Nasopharyngeal Swab  Result Value Ref Range Status   SARS Coronavirus 2 NEGATIVE NEGATIVE Final    Comment: (NOTE) SARS-CoV-2 target nucleic acids are NOT DETECTED. The SARS-CoV-2 RNA is generally detectable in upper and lower respiratory specimens during the acute phase of infection. Negative results do not preclude SARS-CoV-2 infection, do not rule out co-infections with other pathogens, and should not be used as the sole basis for treatment or other patient management decisions. Negative results must be combined with clinical observations, patient history, and epidemiological information. The expected result is Negative. Fact Sheet for Patients: SugarRoll.be Fact Sheet for Healthcare Providers: https://www.woods-mathews.com/ This test is not yet approved or cleared by the [place] FDA and  has been authorized for detection and/or diagnosis of SARS-CoV-2 by FDA under an Emergency Use Authorization (EUA). This EUA will remain  in effect (meaning this test can be used) for the duration of the COVID-19 declaration under Section 56 4(b)(1) of the Act, 21 U.S.C. section 360bbb-3(b)(1), unless the authorization is terminated or revoked sooner. Performed at [facility], [place] [address], [place], [place] [address]   Urine culture     Status: Abnormal   Collection Time: 04/29/19  8:00 AM   Specimen: Urine, Random  Result Value Ref Range Status   Specimen Description   Final    URINE, RANDOM Performed at [place] [address], [place], [place] [address]    Special Requests   Final    NONE Performed at [facility], [place] [address] [place], [place] [address]    Culture (A)  Final    <10,000 COLONIES/mL INSIGNIFICANT GROWTH Performed at [facility] [address],  [place],  [address]    Report Status 04/30/2019 FINAL  Final  Surgical pcr screen     Status: None   Collection Time: 04/29/19  9:26 PM   Specimen: Nasal Mucosa; Nasal Swab  Result Value Ref Range Status   MRSA, PCR NEGATIVE NEGATIVE Final   Staphylococcus aureus NEGATIVE NEGATIVE Final    Comment: (NOTE) The Xpert SA Assay (FDA approved for NASAL specimens in patients 4 years of age and  older), is one component of a comprehensive surveillance program. It is not intended to diagnose infection nor to guide or monitor treatment. Performed at [facility], [place] [address], [place], [place] [address]       Radiology Studies: DG C-Arm 1-60 Min-No Report  Result Date: 04/30/2019 Fluoroscopy was utilized by the requesting physician.  No radiographic interpretation.   ECHOCARDIOGRAM COMPLETE  Result Date: 04/30/2019    ECHOCARDIOGRAM REPORT   Patient Name:   Chananya Shed Date of Exam: 04/30/2019 Medical Rec #:  [id number]     Height:       67.0 in Accession #:    [id number]    Weight:       130.0 lb Date of Birth:  1960/08/31     BSA:          1.68 m Patient Age:    61 years      BP:           137/80 mmHg Patient Gender: M             HR:           97 bpm. Exam Location:  Inpatient Procedure: 2D Echo, Color Doppler and Cardiac Doppler Indications:    M19.62 Acute diastolic (congestive) heart failure  History:        Patient has prior history of Echocardiogram examinations, most                 recent 08/07/2018. CHF.  Sonographer:    Raquel Sarna Senior RDCS Referring Phys: [id number] [facility]  Sonographer Comments: Technically difficult due to rib shadowing, patient unable to turn due to hip fracture. IMPRESSIONS  1. LV endocardial borders are poorly visualized and cannot rule out focal wall motion abonrmalities on this study. LVF appears at least mlidly reduced. Recommend definity contrast study to get accurate assessment of wall motion and EF. The left ventrical is not well visualized.  The left ventricular internal cavity size was moderately dilated. Left ventricular diastolic parameters are consistent with Grade I diastolic dysfunction (impaired relaxation).  2. Right ventricular systolic function is normal. The right ventricular size is normal. There is normal pulmonary artery systolic pressure.  3. Right atrial size was mildly dilated.  4. The mitral valve is normal in structure and function. no evidence of mitral valve regurgitation. No evidence of mitral stenosis.  5. The aortic valve is tricuspid. Aortic valve regurgitation is not visualized. Mild aortic valve sclerosis is present, with no evidence of aortic valve stenosis.  6. The inferior vena cava is normal in size with <50% respiratory variability, suggesting right atrial pressure of 8 mmHg. Conclusion(s)/Recomendation(s): Poor windows for evaluation of left ventricular function by transthoracic echocardiography. Would recommend definity contrast study to get accurate assessment of wall motion and EF. FINDINGS  Left Ventricle: LV endocardial borders are poorly visualized and cannot rule out focal wall motio n abonrmalities on this study. LVF appears at least mlidly reduced. Recommend definity contrast study to get accurate assessment of wall motion and EF.The left ventricle is not well visualized. The left ventricular internal cavity size was moderately dilated. There is no left ventricular hypertrophy. Left ventricular diastolic parameters are consistent with Grade I diastolic dysfunction (impaired relaxation). Right Ventricle: The right ventricular size is normal. No increase in right ventricular wall thickness. Right ventricular systolic function is normal. There is normal pulmonary artery systolic pressure. The tricuspid regurgitant velocity is 2.25 m/s, and  with an assumed right atrial pressure of 8 mmHg,  the estimated right ventricular systolic pressure is 85.0 mmHg. Left Atrium: Left atrial size was normal in size. Right Atrium:  Right atrial size was mildly dilated. Pericardium: There is no evidence of pericardial effusion. Mitral Valve: The mitral valve is normal in structure and function. There is mild thickening of the mitral valve leaflet(s). Normal mobility of the mitral valve leaflets. Mild mitral annular calcification. No evidence of mitral valve regurgitation. No evidence of mitral valve stenosis. Tricuspid Valve: The tricuspid valve is normal in structure. Tricuspid valve regurgitation is trivial. No evidence of tricuspid stenosis. Aortic Valve: The aortic valve is tricuspid. Aortic valve regurgitation is not visualized. Mild aortic valve sclerosis is present, with no evidence of aortic valve stenosis. Pulmonic Valve: The pulmonic valve was normal in structure. Pulmonic valve regurgitation is not visualized. No evidence of pulmonic stenosis. Aorta: The aortic root is normal in size and structure. Venous: The inferior vena cava is normal in size with less than 50% respiratory variability, suggesting right atrial pressure of 8 mmHg. The inferior vena cava and the hepatic vein show a normal flow pattern. IAS/Shunts: The interatrial septum appears to be lipomatous. No atrial level shunt detected by color flow Doppler.  LEFT VENTRICLE PLAX 2D LVIDd:         6.00 cm       Diastology LVIDs:         4.90 cm       LV e' lateral:   8.27 cm/s LV PW:         0.70 cm       LV E/e' lateral: 4.2 LV IVS:        0.70 cm       LV e' medial:    7.18 cm/s LVOT diam:     2.10 cm       LV E/e' medial:  4.9 LV SV:         46.41 ml LV SV Index:   40.34 LVOT Area:     3.46 cm  LV Volumes (MOD) LV area d, A2C:    25.20 cm LV area d, A4C:    30.80 cm LV area s, A2C:    22.40 cm LV area s, A4C:    24.20 cm LV major d, A2C:   7.44 cm LV major d, A4C:   8.30 cm LV major s, A2C:   7.59 cm LV major s, A4C:   7.64 cm LV vol d, MOD A2C: 73.0 ml LV vol d, MOD A4C: 94.9 ml LV vol s, MOD A2C: 56.9 ml LV vol s, MOD A4C: 64.8 ml LV SV MOD A2C:     16.1 ml LV SV MOD  A4C:     94.9 ml LV SV MOD BP:      26.9 ml RIGHT VENTRICLE RV S prime:     14.50 cm/s TAPSE (M-mode): 1.9 cm LEFT ATRIUM             Index       RIGHT ATRIUM           Index LA diam:        3.10 cm 1.84 cm/m  RA Area:     19.00 cm LA Vol (A2C):   50.0 ml 29.69 ml/m RA Volume:   55.80 ml  33.14 ml/m LA Vol (A4C):   55.2 ml 32.78 ml/m LA Biplane Vol: 52.9 ml 31.41 ml/m  AORTIC VALVE LVOT Vmax:   79.60 cm/s LVOT Vmean:  59.600 cm/s LVOT VTI:  0.134 m  AORTA Ao Root diam: 3.50 cm MITRAL VALVE                        TRICUSPID VALVE MV Area (PHT): 3.60 cm             TR Peak grad:   20.2 mmHg MV Decel Time: 211 msec             TR Vmax:        225.00 cm/s MV E velocity: 35.10 cm/s 103 cm/s MV A velocity: 60.00 cm/s 70.3 cm/s SHUNTS MV E/A ratio:  0.59       1.5       Systemic VTI:  0.13 m                                     Systemic Diam: 2.10 cm Fransico Him MD Electronically signed by Fransico Him MD Signature Date/Time: 04/30/2019/12:18:27 PM    Final    DG HIP OPERATIVE UNILAT WITH PELVIS RIGHT  Result Date: 04/30/2019 CLINICAL DATA:  Right hip fracture. EXAM: OPERATIVE right HIP (WITH PELVIS IF PERFORMED) 3 VIEWS TECHNIQUE: Fluoroscopic spot image(s) were submitted for interpretation post-operatively. Radiation exposure index: 3.8955 mGy. COMPARISON:  None. FINDINGS: Three intraoperative fluoroscopic images of the right hip demonstrate the patient to be status post internal fixation of nondisplaced intertrochanteric fracture of proximal right femur. Intramedullary rod fixation is noted as well of the right femur. IMPRESSION: Status post internal fixation of proximal right femoral intertrochanteric fracture. Electronically Signed   By: Marijo Conception M.D.   On: 04/30/2019 13:57   ECHOCARDIOGRAM LIMITED  Result Date: 04/30/2019    ECHOCARDIOGRAM LIMITED REPORT   Patient Name:   Ronney Pugmire Date of Exam: 04/30/2019 Medical Rec #:  [id number]     Height:       67.0 in Accession #:    [id number]     Weight:       130.0 lb Date of Birth:  09-26-1960     BSA:          1.68 m Patient Age:    89 years      BP:           145/96 mmHg Patient Gender: M             HR:           75 bpm. Exam Location:  Inpatient Procedure: 2D Echo and Intracardiac Opacification Agent Indications:    Evaluation LV function  History:        Patient has prior history of Echocardiogram examinations, most                 recent 04/29/2018.  Sonographer:    Raquel Sarna Senior RDCS Referring Phys: [place] Comments: IMPRESSIONS  1. Poor definity contrast study. LVF appears better in the apical views than in the parasternal views. Overall EF is estimated at 30% with global hypokinesis. Consider cardiac MRI if indicated to get accurate EF. FINDINGS  Left Ventricle: Poor definity contrast study. LVF appears better in the apical views than in the parasternal views. Overall EF is estimated at 30% with global hypokinesis. Consider cardiac MRI if indicated to get accurate EF. Definity contrast agent was  given IV to delineate the left ventricular endocardial borders. Fransico Him MD Electronically signed by Fransico Him MD Signature Date/Time:  04/30/2019/1:36:07 PM    Final      LOS: 2 days   Time spent: More than 50% of that time was spent in counseling and/or coordination of care.  Antonieta Pert, MD [facility]  05/01/2019, 9:46 AM

## 2019-05-01 NOTE — Evaluation (Signed)
Physical Therapy Evaluation Patient Details Name: Trevor Williams MRN: [id number] DOB: 1960-06-24 Today's Date: 05/01/2019   History of Present Illness  59 year old M with history of alcohol abuse/DTs, chronic systolic CHF/an ICM, history of respiratory failure due to pneumonia and CHF, status post intubation admitted with right hip pain after he tripped over a rock and fell on the right hip.  Pt s/p Right femur ORIF 04/30/19  Clinical Impression  Patient is s/p above surgery resulting in functional limitations due to the deficits listed below (see PT Problem List).  Patient will benefit from skilled PT to increase their independence and safety with mobility to allow discharge to the venue listed below.  Pt from homeless shelter and concerned about his belongings.  Pt having increased pain with movement limiting mobility and agreeable to SNF upon d/c to improve his independence prior to returning to shelter.     Follow Up Recommendations SNF    Equipment Recommendations  Rolling walker with 5" wheels    Recommendations for Other Services       Precautions / Restrictions Precautions Precautions: Fall Restrictions Other Position/Activity Restrictions: WBAT      Mobility  Bed Mobility Overal bed mobility: Needs Assistance Bed Mobility: Supine to Sit     Supine to sit: Supervision;HOB elevated        Transfers Overall transfer level: Needs assistance Equipment used: Rolling walker (2 wheeled) Transfers: Sit to/from Stand Sit to Stand: Min assist         General transfer comment: assist to rise and steady, verbal cues for safe technique  Ambulation/Gait Ambulation/Gait assistance: Min assist Gait Distance (Feet): 30 Feet Assistive device: Rolling walker (2 wheeled) Gait Pattern/deviations: Step-to pattern;Decreased stance time - right;Antalgic     General Gait Details: verbal cues for sequence, RW positioning, safety, distance to tolerance  Stairs             Wheelchair Mobility    Modified Rankin (Stroke Patients Only)       Balance Overall balance assessment: History of Falls                                           Pertinent Vitals/Pain Pain Assessment: 0-10 Pain Score: 7  Pain Location: R hip Pain Descriptors / Indicators: Sore;Aching Pain Intervention(s): Repositioned;Premedicated before session;Monitored during session    Home Living Family/patient expects to be discharged to:: Shelter/Homeless                      Prior Function Level of Independence: Independent               Hand Dominance        Extremity/Trunk Assessment        Lower Extremity Assessment Lower Extremity Assessment: Generalized weakness;RLE deficits/detail RLE Deficits / Details: pt self assists due to pain RLE: Unable to fully assess due to pain       Communication   Communication: No difficulties  Cognition Arousal/Alertness: Awake/alert Behavior During Therapy: WFL for tasks assessed/performed Overall Cognitive Status: Within Functional Limits for tasks assessed                                 General Comments: possible decreased safety awareness, he reports getting OOB to get backpack alone?      General Comments  Exercises     Assessment/Plan    PT Assessment Patient needs continued PT services  PT Problem List Decreased strength;Decreased mobility;Decreased range of motion;Decreased knowledge of use of DME;Decreased balance;Decreased activity tolerance;Pain       PT Treatment Interventions DME instruction;Therapeutic exercise;Gait training;Balance training;Functional mobility training;Therapeutic activities;Patient/family education    PT Goals (Current goals can be found in the Care Plan section)  Acute Rehab PT Goals PT Goal Formulation: With patient Time For Goal Achievement: 05/08/19 Potential to Achieve Goals: Good    Frequency Min 3X/week   Barriers to  discharge        Co-evaluation               AM-PAC PT "6 Clicks" Mobility  Outcome Measure Help needed turning from your back to your side while in a flat bed without using bedrails?: A Little Help needed moving from lying on your back to sitting on the side of a flat bed without using bedrails?: A Little Help needed moving to and from a bed to a chair (including a wheelchair)?: A Little Help needed standing up from a chair using your arms (e.g., wheelchair or bedside chair)?: A Little Help needed to walk in hospital room?: A Little Help needed climbing 3-5 steps with a railing? : A Lot 6 Click Score: 17    End of Session Equipment Utilized During Treatment: Gait belt Activity Tolerance: Patient limited by pain Patient left: in chair;with call bell/phone within reach;with chair alarm set Nurse Communication: Mobility status PT Visit Diagnosis: Other abnormalities of gait and mobility (R26.89)    Time: [id number] PT Time Calculation (min) (ACUTE ONLY): 11 min   Charges:   PT Evaluation $PT Eval Low Complexity: 1 Low         Kati PT, DPT Acute Rehabilitation Services Office: [phone]  York Ram E 05/01/2019, 1:05 PM

## 2019-05-01 NOTE — Progress Notes (Signed)
Subjective: 1 Day Post-Op Procedure(s) (LRB): INTRAMEDULLARY (IM) NAIL FEMORAL (Right) Patient reports pain as mild.   Patient seen in rounds for Dr. Alvan Dame. Patient is well, and has had no acute complaints or problems other than discomfort in the right hip. He states his hip pain is much improved after surgery. Patient states he removed his aquacel dressing because he thought there was something wrong with his skin. He denies CP, SHOB, N/V. Voiding without difficulty, positive flatus.  We will start therapy today.   Objective: Vital signs in last 24 hours: Temp:  [97.4 F (36.3 C)-101.7 F (38.7 C)] 97.4 F (36.3 C) (02/11 0651) Pulse Rate:  [57-104] 57 (02/11 0651) Resp:  [13-20] 19 (02/11 0651) BP: (114-149)/(73-102) 131/84 (02/11 0651) SpO2:  [93 %-100 %] 100 % (02/11 0651) Arterial Line BP: (132-141)/(61-69) 132/61 (02/10 1430)  Intake/Output from previous day:  Intake/Output Summary (Last 24 hours) at 05/01/2019 0740 Last data filed at 05/01/2019 0600 Gross per 24 hour  Intake 2022.05 ml  Output 525 ml  Net 1497.05 ml     Intake/Output this shift: No intake/output data recorded.  Labs: Recent Labs    04/28/19 1934 04/29/19 0150 04/30/19 0753 05/01/19 0438  HGB 15.2 13.3 14.3 14.0   Recent Labs    04/30/19 0753 05/01/19 0438  WBC 7.4 7.4  RBC 3.90* 3.84*  HCT 42.6 41.9  PLT 62* 67*   Recent Labs    04/29/19 0150 05/01/19 0438  NA 141 135  K 4.7 4.1  CL 109 103  CO2 24 23  BUN 7 11  CREATININE 0.71 0.72  GLUCOSE 92 118*  CALCIUM 8.1* 8.3*   No results for input(s): LABPT, INR in the last 72 hours.  Exam: General - Patient is Alert and Oriented Extremity - Neurologically intact Sensation intact distally Intact pulses distally Dorsiflexion/Plantar flexion intact Dressing - No aquacel in place on exam. Incisions intact, with dermabond in place. Replaced aquacel dressings today.  Motor Function - intact, moving foot and toes well on exam.    Past Medical History:  Diagnosis Date  . Abnormal liver function tests   . Acute respiratory failure ([place])    a. 07/2018 requiring intubation - CAP/CHF.  Marland Kitchen Alcohol abuse   . Biventricular heart failure ([place])   . Cancer ([place])    skin (left hand)  . Delirium    a. h/o delirium while admitted  . Emphysema of lung ([place])   . Homelessness   . Macrocytic anemia   . Mild CAD    a. cath 07/2018 60% ostial ramus otherwise OK.  . Mitral regurgitation   . NICM (nonischemic cardiomyopathy) ([place])   . Protein calorie malnutrition ([place])   . Tricuspid regurgitation     Assessment/Plan: 1 Day Post-Op Procedure(s) (LRB): INTRAMEDULLARY (IM) NAIL FEMORAL (Right) Principal Problem:   Hip fracture (HCC) Active Problems:   Homeless   Simple chronic bronchitis (HCC)   Tobacco use disorder   Chronic systolic heart failure (HCC)   Alcoholic intoxication without complication (HCC)  Estimated body mass index is 20.36 kg/m as calculated from the following:   Height as of this encounter: 5\' 7"  (1.702 m).   Weight as of this encounter: 59 kg. Advance diet Up with therapy  DVT Prophylaxis - Aspirin Weight bearing as tolerated.  Discharge disposition per medicine team. From an orthopaedic standpoint, patient will be ready for discharge when he is meeting goals with therapy. Discharge per medicine. Patient instructed to keep aquacel dressing in place until  follow up with Dr. Alvan Dame. He may shower with this in place. He will be on Aspirin 81 mg chewable BID x4 weeks. We will send in prescriptions for pain medication. Follow up in the office with Dr. Alvan Dame in 2 weeks.   Griffith Citron, PA-C Orthopedic Surgery [phone] 05/01/2019, 7:40 AM

## 2019-05-01 NOTE — NC FL2 (Signed)
[place] LEVEL OF CARE SCREENING TOOL     IDENTIFICATION  Patient Name: Trevor Williams Birthdate: [birth year] Sex: male Admission Date (Current Location): 04/28/2019  [facility] and [place] Number:  Herbalist and Address:  [facility],  [place] [address] [place]      Provider Number: [id number]  Attending Physician Name and Address:  Antonieta Pert, MD  Relative Name and Phone Number:       Current Level of Care: Hospital Recommended Level of Care: [place] Prior Approval Number:    Date Approved/Denied:   PASRR Number: [id number]  Discharge Plan: SNF    Current Diagnoses: Patient Active Problem List   Diagnosis Date Noted  . Hip fracture ([place]) 04/29/2019  . Closed nondisplaced intertrochanteric fracture of right femur ([place])   . Alcoholic intoxication without complication ([place]) [id number]  . Chronic systolic heart failure ([place]) 03/03/2019  . Elevated LFTs 03/03/2019  . Left lateral knee pain 03/03/2019  . Iron deficiency anemia due to chronic blood loss 03/03/2019  . Hypomagnesemia 03/03/2019  . Skin lesion of face 03/03/2019  . GI bleed 01/26/2019  . Alcohol use 01/26/2019  . Homeless 01/26/2019  . Hypokalemia 01/26/2019  . Fall at home, initial encounter 01/26/2019  . Simple chronic bronchitis ([place]) 01/25/2015  . Tobacco use disorder 01/25/2015  . Chronic midline low back pain with sciatica 12/25/2014    Orientation RESPIRATION BLADDER Height & Weight     Self, Time, Situation, Place  Normal Continent Weight: 130 lb (59 kg) Height:  5\' 7"  (170.2 cm)  BEHAVIORAL SYMPTOMS/MOOD NEUROLOGICAL BOWEL NUTRITION STATUS      Continent Diet(Regular)  AMBULATORY STATUS COMMUNICATION OF NEEDS Skin   Extensive Assist Verbally Surgical wounds                       Personal Care Assistance Level of Assistance  Bathing, Feeding, Dressing Bathing Assistance: Maximum assistance Feeding assistance:  Independent Dressing Assistance: Maximum assistance     Functional Limitations Info  Sight, Speech, Hearing Sight Info: Adequate Hearing Info: Adequate Speech Info: Adequate    SPECIAL CARE FACTORS FREQUENCY  PT (By licensed PT), OT (By licensed OT)     PT Frequency: 5X/WEEK OT Frequency: 5X/WEEK            Contractures Contractures Info: Not present    Additional Factors Info  Code Status, Allergies Code Status Info: Fullcode Allergies Info: Allergies: Doxycycline, Ibuprofen, Rocephin Ceftriaxone, Tylenol Acetaminophen, Vancomycin           Current Medications (05/01/2019):  This is the current hospital active medication list Current Facility-Administered Medications  Medication Dose Route Frequency Provider Last Rate Last Admin  . 0.9 %  sodium chloride infusion  250 mL Intravenous PRN Cristescu, Mircea G, MD      . 0.9 %  sodium chloride infusion   Intravenous Continuous Cristescu, Linard Millers, MD 75 mL/hr at 05/01/19 0016 New Bag at 05/01/19 0016  . acetaminophen (TYLENOL) tablet 500 mg  500 mg Oral Q6H Stinson, Ashley R, PA-C      . alum & mag hydroxide-simeth (MAALOX/MYLANTA) 200-200-20 MG/5ML suspension 30 mL  30 mL Oral Q4H PRN Maurice March, PA-C      . aspirin EC tablet 81 mg  81 mg Oral BID Maurice March, PA-C   81 mg at 05/01/19 [id number]  . bisacodyl (DULCOLAX) EC tablet 5 mg  5 mg Oral Daily PRN Maurice March, PA-C      .  carvedilol (COREG) tablet 3.125 mg  3.125 mg Oral BID WC Griffith Citron R, PA-C   3.125 mg at 05/01/19 0908  . chlordiazePOXIDE (LIBRIUM) capsule 25 mg  25 mg Oral BID Maurice March, PA-C   25 mg at 05/01/19 [id number]  . [START ON 05/02/2019] chlordiazePOXIDE (LIBRIUM) capsule 25 mg  25 mg Oral Q0600 Maurice March, PA-C      . docusate sodium (COLACE) capsule 100 mg  100 mg Oral BID Maurice March, PA-C   100 mg at 05/01/19 [id number]  . feeding supplement (ENSURE ENLIVE) (ENSURE ENLIVE) liquid 237 mL  237 mL Oral TID BM Maurice March, PA-C   237 mL at 05/01/19 0913  . ferrous sulfate tablet 325 mg  325 mg Oral TID PC Maurice March, PA-C   325 mg at 05/01/19 0908  . folic acid (FOLVITE) tablet 1 mg  1 mg Oral Daily Maurice March, PA-C   1 mg at 05/01/19 [id number]  . gabapentin (NEURONTIN) capsule 300 mg  300 mg Oral TID Maurice March, PA-C   300 mg at 05/01/19 [id number]  . HYDROcodone-acetaminophen (NORCO) 7.5-325 MG per tablet 1-2 tablet  1-2 tablet Oral Q4H PRN Maurice March, PA-C   2 tablet at 05/01/19 [id number]  . HYDROcodone-acetaminophen (NORCO/VICODIN) 5-325 MG per tablet 1-2 tablet  1-2 tablet Oral Q4H PRN Maurice March, PA-C   2 tablet at 05/01/19 0509  . LORazepam (ATIVAN) tablet 1-4 mg  1-4 mg Oral Q1H PRN Maurice March, PA-C       Or  . LORazepam (ATIVAN) injection 1-4 mg  1-4 mg Intravenous Q1H PRN Maurice March, PA-C      . menthol-cetylpyridinium (CEPACOL) lozenge 3 mg  1 lozenge Oral PRN Griffith Citron R, PA-C       Or  . phenol (CHLORASEPTIC) mouth spray 1 spray  1 spray Mouth/Throat PRN Maurice March, PA-C      . methocarbamol (ROBAXIN) tablet 500 mg  500 mg Oral Q6H PRN Maurice March, PA-C   500 mg at 05/01/19 1032   Or  . methocarbamol (ROBAXIN) 500 mg in dextrose 5 % 50 mL IVPB  500 mg Intravenous Q6H PRN Maurice March, PA-C      . metoCLOPramide (REGLAN) tablet 5-10 mg  5-10 mg Oral Q8H PRN Maurice March, PA-C       Or  . metoCLOPramide (REGLAN) injection 5-10 mg  5-10 mg Intravenous Q8H PRN Maurice March, PA-C      . morphine 2 MG/ML injection 0.5-1 mg  0.5-1 mg Intravenous Q2H PRN Maurice March, PA-C      . multivitamin with minerals tablet 1 tablet  1 tablet Oral Daily Maurice March, PA-C   1 tablet at 05/01/19 [id number]  . nicotine (NICODERM CQ - dosed in mg/24 hours) patch 14 mg  14 mg Transdermal Daily Kc, Ramesh, MD   14 mg at 05/01/19 0909  . ondansetron (ZOFRAN) tablet 4 mg  4 mg Oral Q6H PRN Maurice March, PA-C       Or  . ondansetron [facility])  injection 4 mg  4 mg Intravenous Q6H PRN Maurice March, PA-C      . pantoprazole (PROTONIX) EC tablet 40 mg  40 mg Oral Daily Maurice March, PA-C   40 mg at 05/01/19 [id number]  . polyethylene glycol (MIRALAX / GLYCOLAX) packet 17 g  17 g Oral Daily PRN Maurice March, PA-C      .  rosuvastatin (CRESTOR) tablet 10 mg  10 mg Oral q1800 Maurice March, PA-C   10 mg at 04/30/19 1804  . sacubitril-valsartan (ENTRESTO) 24-26 mg per tablet  1 tablet Oral BID Maurice March, [place]   1 tablet at 05/01/19 [id number]  . sodium chloride flush (NS) 0.9 % injection 3 mL  3 mL Intravenous Q12H Maurice March, PA-C   3 mL at 05/01/19 0910  . sodium chloride flush (NS) 0.9 % injection 3 mL  3 mL Intravenous PRN Maurice March, PA-C      . thiamine tablet 100 mg  100 mg Oral Daily Maurice March, PA-C   100 mg at 05/01/19 [id number]   Or  . thiamine (B-1) injection 100 mg  100 mg Intravenous Daily Maurice March, [place]         Discharge Medications: Please see discharge summary for a list of discharge medications.  Relevant Imaging Results:  Relevant Lab Results:   Additional Information SSN: [id number]  Lia Hopping, LCSW

## 2019-05-02 LAB — BASIC METABOLIC PANEL
Anion gap: 7 (ref 5–15)
BUN: 15 mg/dL (ref 6–20)
CO2: 21 mmol/L — ABNORMAL LOW (ref 22–32)
Calcium: 8.1 mg/dL — ABNORMAL LOW (ref 8.9–10.3)
Chloride: 107 mmol/L (ref 98–111)
Creatinine, Ser: 0.66 mg/dL (ref 0.61–1.24)
GFR calc Af Amer: >60 mL/min (ref >60)
GFR calc non Af Amer: >60 mL/min (ref >60)
Glucose, Bld: 96 mg/dL (ref 70–99)
Potassium: 3.3 mmol/L — ABNORMAL LOW (ref 3.5–5.1)
Sodium: 135 mmol/L (ref 135–145)

## 2019-05-02 LAB — CBC
HCT: 42.4 % (ref 39.0–52.0)
Hemoglobin: 13.7 g/dL (ref 13.0–17.0)
MCH: 36.2 pg — ABNORMAL HIGH (ref 26.0–34.0)
MCHC: 32.3 g/dL (ref 30.0–36.0)
MCV: 112.2 fL — ABNORMAL HIGH (ref 80.0–100.0)
Platelets: 60 10*3/uL — ABNORMAL LOW (ref 150–400)
RBC: 3.78 MIL/uL — ABNORMAL LOW (ref 4.22–5.81)
RDW: 12.6 % (ref 11.5–15.5)
WBC: 4.2 10*3/uL (ref 4.0–10.5)
nRBC: 0 % (ref 0.0–0.2)

## 2019-05-02 MED ORDER — NICOTINE POLACRILEX 2 MG MT GUM
2.0000 mg | CHEWING_GUM | OROMUCOSAL | Status: DC | PRN
  Administered 2019-05-02: 2 mg via ORAL
  Filled 2019-05-02 (×3): qty 1

## 2019-05-02 MED ORDER — POTASSIUM CHLORIDE CRYS ER 20 MEQ PO TBCR
40.0000 meq | EXTENDED_RELEASE_TABLET | Freq: Once | ORAL | Status: AC
  Administered 2019-05-02: 08:00:00 40 meq via ORAL
  Filled 2019-05-02: qty 2

## 2019-05-02 NOTE — Progress Notes (Signed)
RN walked in patients room and patient was dressed in all of his clothing/jackets and stating, "I am going outside to smoke." RN educated that this is not feasible. Patient became upset and said he really needs a cigarette. Rn gave patient his pain medications d/t increased pain from all the movement. RN paged MD to ask for nicotine gum. Awaiting response. Call bell in reach.

## 2019-05-02 NOTE — Progress Notes (Signed)
During shift change patient was found to have 2 packs of cigarettes, he was informed that he is unable to have them. This RN removed them from his room and placed them in his chart.

## 2019-05-02 NOTE — TOC Progression Note (Signed)
Transition of Care [facility]) - Progression Note    Patient Details  Name: Trevor Williams MRN: [id number] Date of Birth: 1960/05/31  Transition of Care [facility]) CM/SW [place], LCSW Phone Number: 05/02/2019, 12:23 PM  Clinical Narrative:    Patient insurance Medicaid [place] access does not have SNF benefits. It is for community care only.   Barriers: No SNF bed.    Expected Discharge Plan: [place] Barriers to Discharge: No SNF bed, Inadequate or no insurance, Homeless with medical needs  Expected Discharge Plan and Services Expected Discharge Plan: [place] In-house Referral: Clinical Social Work Discharge Planning Services: CM Consult Post Acute Care Choice: [place] arrangements for the past 2 months: [place] Determinants of Health (SDOH) Interventions    Readmission Risk Interventions Readmission Risk Prevention Plan 05/01/2019  Transportation Screening Complete  Medication Review [employer]) Complete  PCP or Specialist appointment within 3-5 days of discharge (No Data)  SW Recovery Care/Counseling Consult Complete  Palliative Care Screening Not Applicable  Skilled Nursing Facility Complete  Some recent data might be hidden

## 2019-05-02 NOTE — Progress Notes (Signed)
Physical Therapy Treatment Patient Details Name: Trevor Williams MRN: [id number] DOB: 24-Oct-1960 Today's Date: 05/02/2019    History of Present Illness 59 year old M with history of alcohol abuse/DTs, chronic systolic CHF/an ICM, history of respiratory failure due to pneumonia and CHF, status post intubation admitted with right hip pain after he tripped over a rock and fell on the right hip.  Pt s/p Right femur ORIF 04/30/19    PT Comments    Pt standing in room on arrival.  Pt reports he was in bathroom attempting to provide stool sample (?).  Pt alone in room.  Pt declined ambulating in hallway due to 10/10 hip pain so safely assisted back to bed and repositioned.   Follow Up Recommendations  SNF     Equipment Recommendations  Rolling walker with 5" wheels    Recommendations for Other Services       Precautions / Restrictions Precautions Precautions: Fall Restrictions Other Position/Activity Restrictions: WBAT    Mobility  Bed Mobility Overal bed mobility: Needs Assistance Bed Mobility: Sit to Supine       Sit to supine: Min guard   General bed mobility comments: pt self assisted R LE onto bed  Transfers Overall transfer level: Needs assistance Equipment used: Rolling walker (2 wheeled) Transfers: Sit to/from Stand Sit to Stand: Min guard         General transfer comment: verbal cues for safe technique  Ambulation/Gait Ambulation/Gait assistance: Min guard Gait Distance (Feet): 8 Feet Assistive device: Rolling walker (2 wheeled) Gait Pattern/deviations: Step-to pattern;Decreased stance time - right;Antalgic     General Gait Details: verbal cues for sequence, RW positioning, safety   Stairs             Wheelchair Mobility    Modified Rankin (Stroke Patients Only)       Balance                                            Cognition Arousal/Alertness: Awake/alert Behavior During Therapy: WFL for tasks  assessed/performed Overall Cognitive Status: Within Functional Limits for tasks assessed                                 General Comments: ?safety awareness      Exercises      General Comments        Pertinent Vitals/Pain Pain Assessment: 0-10 Pain Score: 10-Worst pain ever Pain Location: R hip Pain Descriptors / Indicators: Sore;Aching Pain Intervention(s): Repositioned;Monitored during session    Home Living                      Prior Function            PT Goals (current goals can now be found in the care plan section) Progress towards PT goals: Progressing toward goals    Frequency    Min 3X/week      PT Plan Current plan remains appropriate    Co-evaluation              AM-PAC PT "6 Clicks" Mobility   Outcome Measure  Help needed turning from your back to your side while in a flat bed without using bedrails?: A Little Help needed moving from lying on your back to sitting on the side of a flat bed without using  bedrails?: A Little Help needed moving to and from a bed to a chair (including a wheelchair)?: A Little Help needed standing up from a chair using your arms (e.g., wheelchair or bedside chair)?: A Little Help needed to walk in hospital room?: A Little Help needed climbing 3-5 steps with a railing? : A Lot 6 Click Score: 17    End of Session Equipment Utilized During Treatment: Gait belt   Patient left: with call bell/phone within reach;in bed(unable to set bed alarm due to low weight reading, RN aware) Nurse Communication: Mobility status(fall risk) PT Visit Diagnosis: Other abnormalities of gait and mobility (R26.89)     Time: [id number] PT Time Calculation (min) (ACUTE ONLY): 8 min  Charges:  $Gait Training: 8-22 mins                    Arlyce Dice, DPT Acute Rehabilitation Services Office: [place] 05/02/2019, 3:37 PM

## 2019-05-02 NOTE — Addendum Note (Signed)
Addendum  created 05/02/19 1233 by Silas [place], CRNA   Charge Capture section accepted

## 2019-05-02 NOTE — Progress Notes (Signed)
PROGRESS NOTE    Trevor Williams  BMW:[id number] DOB: 09/25/1960 DOA: 04/28/2019 PCP: Elsie Stain, MD   Brief Narrative: 59 year old M with history of alcohol abuse/DTs, chronic systolic CHF/an ICM, history of respiratory failure due to pneumonia and CHF, status post intubation admitted with right hip pain after he tripped over a rock and fell on the right hip.In the ED CT head, neck no acute finding, CT abdomen pelvis showed intertrochanteric fracture right hip.  Blood work with alcohol level 330, platelet 80,000. Patient admitted for right hip intertrochanteric fracture.  Underwent ORIF 2/10. Patient doing well postoperatively.  Seen by PT OT and has advised skilled nursing facility. He has been deemed stable for discharge to skilled nursing facility once bed available.  Subjective:  No new complaints.  Resting comfortably.   Assessment & Plan:   Right hip intertrochanteric fracture- 2/2 fall, s/p ORIF 2/10.well post op, continue on PT OT, pain control, aspirin 81 twice daily for 4 weeks for DVT prophylaxis as per orthopedics.    Chronic congestive heart failure with systolic dysfunction/NICM: Euvolemic. Echo reviewed from 08/03/2025 LV moderately dilated with severe systolic dysfunction EF 15 to 25%, severely reduced RVEF.   Echo this admit with improved EF to 30%.  He has follow-up arranged to CHF clinic on 3/18 Cont home  Coreg 3.125, Crestor 10 mg, Entresto 25-26.  Stop IV fluid and watch for fluid overload.    Alcohol abuse with alcohol intoxication with history of DTs, alcohol level 331 on admission.  continue on alcohol withdrawal protocol w/ CIWA, cont  thiamine and folate and IV fluids. Complted Librium taper  Chronic thrombocytopenia: Suspect in the setting of chronic alcohol abuse.  Monitor platelet count. They were as low as 32k in 07/2018, Transfuse as needed. Will monitor closely. Recent Labs  Lab 04/28/19 1934 04/29/19 0150 04/30/19 0753 05/01/19 0438 05/02/19 0338   PLT 80* 75* 62* 67* 60*   Chronic bronchitis: Doing well on room air.  Tobacco abuse placed on nicotine patch.  Social situation: Homeless  COVID 19 NEG  Nutrition: Nutrition Problem: Increased nutrient needs Etiology: social / environmental circumstances, hip fracture(ETOH abuse) Signs/Symptoms: estimated needs Interventions: Ensure Enlive (each supplement provides 350kcal and 20 grams of protein), MVI, Magic cup  Body mass index is 20.36 kg/m.   DVT prophylaxis: SCD-aspirin 81 mg twice daily per Ortho. Code Status:FULL Family Communication: plan of care discussed with patient at bedside. Disposition Plan:Patient is from: Homeless/insured       Anticipated d/c to: SNF       Barriers to d/c or conditions that needs to be met prior to d/c: Medically stable discharge to SNF once bed available.    Consultants: ortho, cardiology  Procedures: 04/30/19 Open reduction internal fixation of right intertrochanteric femur fracture utilizing a [employer]omet Affixus nail, 11 x 180 mm, with 130-degree lag screw  Microbiology:see note Antimicrobials: Anti-infectives (From admission, onward)   Start     Dose/Rate Route Frequency Ordered Stop   04/30/19 1830  ceFAZolin (ANCEF) IVPB 2g/100 mL premix     2 g 200 mL/hr over 30 Minutes Intravenous Every 6 hours 04/30/19 1520 05/01/19 0054   04/30/19 1130  ceFAZolin (ANCEF) IVPB 2g/100 mL premix     2 g 200 mL/hr over 30 Minutes Intravenous On call to O.R. 04/30/19 1125 04/30/19 1259      Medications: Scheduled Meds: . aspirin EC  81 mg Oral BID  . carvedilol  3.125 mg Oral BID WC  . chlordiazePOXIDE  25 mg  Oral Q0600  . docusate sodium  100 mg Oral BID  . feeding supplement (ENSURE ENLIVE)  237 mL Oral TID BM  . ferrous sulfate  325 mg Oral TID PC  . folic acid  1 mg Oral Daily  . gabapentin  300 mg Oral TID  . multivitamin with minerals  1 tablet Oral Daily  . nicotine  14 mg Transdermal Daily  . pantoprazole  40 mg Oral Daily  .  rosuvastatin  10 mg Oral q1800  . sacubitril-valsartan  1 tablet Oral BID  . sodium chloride flush  3 mL Intravenous Q12H  . thiamine  100 mg Oral Daily   Or  . thiamine  100 mg Intravenous Daily   Continuous Infusions: . sodium chloride    . methocarbamol (ROBAXIN) IV      Objective: Vitals:   05/01/19 1823 05/01/19 2122 05/02/19 0512 05/02/19 0735  BP: 121/82 121/81 118/71 99/72  Pulse: 74 85 89 91  Resp: '16 16 16 18  '$ Temp: 98.2 F (36.8 C) 97.8 F (36.6 C) 98 F (36.7 C)   TempSrc: Oral Oral Oral   SpO2: 100% 95% 95%   Weight:      Height:        Intake/Output Summary (Last 24 hours) at 05/02/2019 1003 Last data filed at 05/02/2019 0933 Gross per 24 hour  Intake 2100 ml  Output 880 ml  Net 1220 ml   Filed Weights   04/28/19 1930 04/29/19 0123  Weight: 59 kg 59 kg   Weight change:   Body mass index is 20.36 kg/m.  Intake/Output from previous day: 02/11 0701 - 02/12 0700 In: 2445 [P.O.:720; I.V.:1725] Out: 880 [Urine:880] Intake/Output this shift: Total I/O In: 120 [P.O.:120] Out: -   Examination: General exam: AAO X3, NAD, weak appearing. HEENT:Oral mucosa moist, Ear/Nose WNL grossly, dentition normal. Respiratory system: bilaterally clear,no wheezing or crackles,no use of accessory muscle Cardiovascular system: S1 & S2 +, No JVD,. Gastrointestinal system: Abdomen soft, NT,ND, BS+ Nervous System:Alert, awake, moving extremities and grossly nonfocal Extremities: No edema, distal peripheral pulses palpable.  Right hip aquacel dressing intact Skin: No rashes,no icterus. MSK: Normal muscle bulk,tone, power   Data Reviewed: I have personally reviewed following labs and imaging studies  CBC: Recent Labs  Lab 04/28/19 1934 04/29/19 0150 04/30/19 0753 05/01/19 0438 05/02/19 0338  WBC 4.9 6.7 7.4 7.4 4.2  NEUTROABS 2.3  --   --   --   --   HGB 15.2 13.3 14.3 14.0 13.7  HCT 46.4 41.4 42.6 41.9 42.4  MCV 108.7* 111.6* 109.2* 109.1* 112.2*  PLT 80*  75* 62* 67* 60*   Basic Metabolic Panel: Recent Labs  Lab 04/28/19 2056 04/29/19 0132 04/29/19 0150 05/01/19 0438 05/02/19 0338  NA 142  --  141 135 135  K 4.0  --  4.7 4.1 3.3*  CL 105  --  109 103 107  CO2 27  --  24 23 21*  GLUCOSE 90  --  92 118* 96  BUN 7  --  '7 11 15  '$ CREATININE 0.67  --  0.71 0.72 0.66  CALCIUM 9.1  --  8.1* 8.3* 8.1*  MG  --  2.0  --   --   --   PHOS  --  4.7*  --   --   --    GFR: Estimated Creatinine Clearance: 85 mL/min (by C-G formula based on SCr of 0.66 mg/dL). Liver Function Tests: Recent Labs  Lab 04/28/19 2056  AST  65*  ALT 31  ALKPHOS 99  BILITOT 0.6  PROT 7.8  ALBUMIN 3.9   No results for input(s): LIPASE, AMYLASE in the last 168 hours. No results for input(s): AMMONIA in the last 168 hours. Coagulation Profile: No results for input(s): INR, PROTIME in the last 168 hours. Cardiac Enzymes: No results for input(s): CKTOTAL, CKMB, CKMBINDEX, TROPONINI in the last 168 hours. BNP (last 3 results) No results for input(s): PROBNP in the last 8760 hours. HbA1C: No results for input(s): HGBA1C in the last 72 hours. CBG: No results for input(s): GLUCAP in the last 168 hours. Lipid Profile: No results for input(s): CHOL, HDL, LDLCALC, TRIG, CHOLHDL, LDLDIRECT in the last 72 hours. Thyroid Function Tests: No results for input(s): TSH, T4TOTAL, FREET4, T3FREE, THYROIDAB in the last 72 hours. Anemia Panel: No results for input(s): VITAMINB12, FOLATE, FERRITIN, TIBC, IRON, RETICCTPCT in the last 72 hours. Sepsis Labs: No results for input(s): PROCALCITON, LATICACIDVEN in the last 168 hours.  Recent Results (from the past 240 hour(s))  SARS CORONAVIRUS 2 (TAT 6-24 HRS) Nasopharyngeal Nasopharyngeal Swab     Status: None   Collection Time: 04/28/19 10:57 PM   Specimen: Nasopharyngeal Swab  Result Value Ref Range Status   SARS Coronavirus 2 NEGATIVE NEGATIVE Final    Comment: (NOTE) SARS-CoV-2 target nucleic acids are NOT DETECTED. The  SARS-CoV-2 RNA is generally detectable in upper and lower respiratory specimens during the acute phase of infection. Negative results do not preclude SARS-CoV-2 infection, do not rule out co-infections with other pathogens, and should not be used as the sole basis for treatment or other patient management decisions. Negative results must be combined with clinical observations, patient history, and epidemiological information. The expected result is Negative. Fact Sheet for Patients: SugarRoll.be Fact Sheet for Healthcare Providers: https://www.woods-mathews.com/ This test is not yet approved or cleared by the [place] FDA and  has been authorized for detection and/or diagnosis of SARS-CoV-2 by FDA under an Emergency Use Authorization (EUA). This EUA will remain  in effect (meaning this test can be used) for the duration of the COVID-19 declaration under Section 56 4(b)(1) of the Act, 21 U.S.C. section 360bbb-3(b)(1), unless the authorization is terminated or revoked sooner. Performed at [facility], [place] [address], [place], [place] [address]   Urine culture     Status: Abnormal   Collection Time: 04/29/19  8:00 AM   Specimen: Urine, Random  Result Value Ref Range Status   Specimen Description   Final    URINE, RANDOM Performed at [facility] [address], [place], [place] [address]    Special Requests   Final    NONE Performed at [facility], [place] [address] [place], [place] [address]    Culture (A)  Final    <10,000 COLONIES/mL INSIGNIFICANT GROWTH Performed at [facility] [address], [place],  [address]    Report Status 04/30/2019 FINAL  Final  Surgical pcr screen     Status: None   Collection Time: 04/29/19  9:26 PM   Specimen: Nasal Mucosa; Nasal Swab  Result Value Ref Range Status   MRSA, PCR NEGATIVE NEGATIVE Final   Staphylococcus aureus NEGATIVE  NEGATIVE Final    Comment: (NOTE) The Xpert SA Assay (FDA approved for NASAL specimens in patients 57 years of age and older), is one component of a comprehensive surveillance program. It is not intended to diagnose infection nor to guide or monitor treatment. Performed at [facility], [place] [address], [place], [place] [address]       Radiology Studies: DG C-Arm 1-60 Min-No Report  Result Date: 04/30/2019 Fluoroscopy was utilized by the requesting physician.  No radiographic interpretation.   DG HIP OPERATIVE UNILAT WITH PELVIS RIGHT  Result Date: 04/30/2019 CLINICAL DATA:  Right hip fracture. EXAM: OPERATIVE right HIP (WITH PELVIS IF PERFORMED) 3 VIEWS TECHNIQUE: Fluoroscopic spot image(s) were submitted for interpretation post-operatively. Radiation exposure index: 3.8955 mGy. COMPARISON:  None. FINDINGS: Three intraoperative fluoroscopic images of the right hip demonstrate the patient to be status post internal fixation of nondisplaced intertrochanteric fracture of proximal right femur. Intramedullary rod fixation is noted as well of the right femur. IMPRESSION: Status post internal fixation of proximal right femoral intertrochanteric fracture. Electronically Signed   By: Marijo Conception M.D.   On: 04/30/2019 13:57   ECHOCARDIOGRAM LIMITED  Result Date: 04/30/2019    ECHOCARDIOGRAM LIMITED REPORT   Patient Name:   Etienne Galka Date of Exam: 04/30/2019 Medical Rec #:  [id number]     Height:       67.0 in Accession #:    [id number]    Weight:       130.0 lb Date of Birth:  1961-01-03     BSA:          1.68 m Patient Age:    63 years      BP:           145/96 mmHg Patient Gender: M             HR:           75 bpm. Exam Location:  Inpatient Procedure: 2D Echo and Intracardiac Opacification Agent Indications:    Evaluation LV function  History:        Patient has prior history of Echocardiogram examinations, most                 recent 04/29/2018.  Sonographer:    Raquel Sarna Senior  RDCS Referring Phys: [place] Comments: IMPRESSIONS  1. Poor definity contrast study. LVF appears better in the apical views than in the parasternal views. Overall EF is estimated at 30% with global hypokinesis. Consider cardiac MRI if indicated to get accurate EF. FINDINGS  Left Ventricle: Poor definity contrast study. LVF appears better in the apical views than in the parasternal views. Overall EF is estimated at 30% with global hypokinesis. Consider cardiac MRI if indicated to get accurate EF. Definity contrast agent was  given IV to delineate the left ventricular endocardial borders. Fransico Him MD Electronically signed by Fransico Him MD Signature Date/Time: 04/30/2019/1:36:07 PM    Final      LOS: 3 days   Time spent: More than 50% of that time was spent in counseling and/or coordination of care.  Antonieta Pert, MD [facility]  05/02/2019, 10:03 AM

## 2019-05-02 NOTE — Discharge Summary (Signed)
Physician Discharge Summary  Trevor Williams GGE:[id number] DOB: November 24, 1960 DOA: 04/28/2019  PCP: Elsie Stain, MD  Admit date: 04/28/2019 Discharge date: 05/07/2019  Admitted From: shelter Disposition: SNF  Recommendations for Outpatient Follow-up:  1. Follow up with PCP in 1-2 weeks 2. Please obtain BMP/CBC in one week 3. Please follow up on the following pending results:  Home Health: no  Equipment/Devices: None  Discharge Condition: Stable Code Status: full Diet recommendation: Heart Healthy  Brief/Interim Summary: 59 year old M with history of alcohol abuse/DTs, chronic systolic CHF/an ICM, history of respiratory failure due to pneumonia and CHF, status post intubation admitted with right hip pain after he tripped over a rock and fell on the right hip.In the ED CT head, neck no acute finding, CT abdomen pelvis showed intertrochanteric fracture right hip.  Blood work with alcohol level 330, platelet 80,000. Patient admitted for right hip intertrochanteric fracture.  Underwent ORIF 2/10. Patient doing well postoperatively.  Seen by PT OT and has advised skilled nursing facility. He has been deemed stable for discharge .  Discharge Diagnoses:  Right hip intertrochanteric fracture- 2/2 fall, s/p ORIF 2/10.well post op, continue on PT OT, pain control with muscle relaxant and Vicodin(prescribed already  By orthopedics), aspirin 81 twice daily for 4 weeks for DVT prophylaxis as per orthopedics.     Chronic congestive heart failure with systolic dysfunction/NICM: Euvolemic. Echo reviewed from 08/03/2025 LV moderately dilated with severe systolic dysfunction EF 15 to 25%, severely reduced RVEF.   Echo this admit with improved EF to 30%.  He has follow-up arranged to CHF clinic on 3/18 Cont home  Coreg 3.125, Crestor 10 mg, Entresto 25-26.   Monitor weight.   Alcohol abuse with alcohol intoxication with history of DTs, alcohol level 331 on admission.  Treated with Librium taper.  No signs of  withdrawal.  Continue thiamine and vitamins.  Cessation advised.   Chronic thrombocytopenia: Suspect in the setting of chronic alcohol abuse.They were as low as 32k in 07/2018. In 60-70k here.   Chronic bronchitis: Doing well on room air.   Tobacco abuse placed on nicotine patch.   Social situation: Homeless   COVID 19 NEG   Nutrition: Nutrition Problem: Increased nutrient needs Etiology: social / environmental circumstances, hip fracture(ETOH abuse) Signs/Symptoms: estimated needs Interventions: Ensure Enlive (each supplement provides 350kcal and 20 grams of protein), MVI, Magic cup   Body mass index is 20.36 kg/m.    DVT prophylaxis: SCD-aspirin 81 mg twice daily per Ortho. Code Status:FULL Family Communication: plan of care discussed with patient at bedside. Disposition Plan:Patient is from: From shelter/insured       Anticipated d/c to:SNF       Barriers to d/c or conditions that needs to be met prior to d/c: Medically stable discharge to skilled nursing facility.  Follow-up with orthopedics.   Consultants: ortho, cardiology  Procedures: 04/30/19 Open reduction internal fixation of right intertrochanteric femur fracture utilizing a Biomet Affixus nail, 11 x 180 mm, with 130-degree lag screw   Subjective: Resting well having bowel movement.  No new complaints.  Discharge Exam: Vitals:   05/06/19 2100 05/07/19 0500  BP: 115/71 (!) 146/82  Pulse: 73 76  Resp: 16 16  Temp: 97.7 F (36.5 C) 98.1 F (36.7 C)  SpO2: 98% 95%   General: Pt is alert, awake, not in acute distress Cardiovascular: RRR, S1/S2 +, no rubs, no gallops Respiratory: CTA bilaterally, no wheezing, no rhonchi Abdominal: Soft, NT, ND, bowel sounds + Extremities: no edema, no cyanosis  Discharge Instructions  Discharge Instructions    (HEART FAILURE PATIENTS) Call MD:  Anytime you have any of the following symptoms: 1) 3 pound weight gain in 24 hours or 5 pounds in 1 week 2) shortness of breath,  with or without a dry hacking cough 3) swelling in the hands, feet or stomach 4) if you have to sleep on extra pillows at night in order to breathe.   Complete by: As directed    Diet - low sodium heart healthy   Complete by: As directed    Discharge instructions   Complete by: As directed    Please call call MD or return to ER for similar or worsening recurring problem that brought you to hospital or if any fever,nausea/vomiting,abdominal pain, uncontrolled pain, chest pain,  shortness of breath or any other alarming symptoms.  Please follow-up your doctor as instructed in a week time and call the office for appointment.  Please avoid alcohol, smoking, or any other illicit substance and maintain healthy habits including taking your regular medications as prescribed.  You were cared for by a hospitalist during your hospital stay. If you have any questions about your discharge medications or the care you received while you were in the hospital after you are discharged, you can call the unit and ask to speak with the hospitalist on call if the hospitalist that took care of you is not available.  Once you are discharged, your primary care physician will handle any further medical issues. Please note that NO REFILLS for any discharge medications will be authorized once you are discharged, as it is imperative that you return to your primary care physician (or establish a relationship with a primary care physician if you do not have one) for your aftercare needs so that they can reassess your need for medications and monitor your lab values   Increase activity slowly   Complete by: As directed      Allergies as of 05/07/2019      Reactions   Doxycycline Rash   Rash noted after administration of vancomycin, doxycycline, and ceftriaxone. Unclear cause of rash.   Ibuprofen Rash   Rocephin [ceftriaxone] Rash   Rash noted after administration of vancomycin, doxycycline, and ceftriaxone. Unclear cause of  rash.   Tylenol [acetaminophen] Rash   Vancomycin Rash   Rash noted after administration of vancomycin, doxycycline, and ceftriaxone. Unclear cause of rash.      Medication List    TAKE these medications   albuterol 108 (90 Base) MCG/ACT inhaler Commonly known as: VENTOLIN HFA Inhale 2 puffs into the lungs every 4 (four) hours as needed for wheezing or shortness of breath.   aspirin 81 MG EC tablet Take 1 tablet (81 mg total) by mouth 2 (two) times daily for 28 days.   carvedilol 3.125 MG tablet Commonly known as: COREG Take 1 tablet (3.125 mg total) by mouth 2 (two) times daily with a meal.   feeding supplement (ENSURE ENLIVE) Liqd Take 237 mLs by mouth 3 (three) times daily between meals.   ferrous sulfate 325 (65 FE) MG tablet Take 1 tablet (325 mg total) by mouth 3 (three) times daily after meals for 14 days.   folic acid 1 MG tablet Commonly known as: FOLVITE Take 1 tablet (1 mg total) by mouth daily.   gabapentin 300 MG capsule Commonly known as: NEURONTIN Take 1 capsule (300 mg total) by mouth 3 (three) times daily.   HYDROcodone-acetaminophen 5-325 MG tablet Commonly known as: NORCO/VICODIN Take  1-2 tablets by mouth every 6 (six) hours as needed for moderate pain (pain score 4-6).   methocarbamol 500 MG tablet Commonly known as: ROBAXIN Take 1 tablet (500 mg total) by mouth every 6 (six) hours as needed for muscle spasms.   pantoprazole 40 MG tablet Commonly known as: Protonix Take 1 tablet (40 mg total) by mouth daily.   rosuvastatin 10 MG tablet Commonly known as: CRESTOR Take 1 tablet (10 mg total) by mouth daily at 6 PM.   sacubitril-valsartan 24-26 MG Commonly known as: ENTRESTO Take 1 tablet by mouth 2 (two) times daily.   thiamine 100 MG tablet Take 1 tablet (100 mg total) by mouth daily.       Contact information for follow-up providers    Paralee Cancel, MD. Schedule an appointment as soon as possible for a visit in 2 weeks.   Specialty:  Orthopedic Surgery Contact information: [address] [place] [address] [place] [address] [phone]        Larey Dresser, MD. Go on 06/05/2019.   Specialty: Cardiology Why: '@11am'$  with Dr. Aundra Dubin PA/NP for hospital follow up Please call for direction and parking code Contact information: [place] [address] [place] Follow up.   Contact information: [address]  [place] [address] [phone]           Contact information for after-discharge care    Destination    [facility] .   Service: Skilled Nursing Contact information: [place] [phone]                 Allergies  Allergen Reactions  . Doxycycline Rash    Rash noted after administration of vancomycin, doxycycline, and ceftriaxone. Unclear cause of rash.  . Ibuprofen Rash  . Rocephin [Ceftriaxone] Rash    Rash noted after administration of vancomycin, doxycycline, and ceftriaxone. Unclear cause of rash.  . Tylenol [Acetaminophen] Rash  . Vancomycin Rash    Rash noted after administration of vancomycin, doxycycline, and ceftriaxone. Unclear cause of rash.    The results of significant diagnostics from this hospitalization (including imaging, microbiology, ancillary and laboratory) are listed below for reference.    Microbiology: Recent Results (from the past 240 hour(s))  SARS CORONAVIRUS 2 (TAT 6-24 HRS) Nasopharyngeal Nasopharyngeal Swab     Status: None   Collection Time: 04/28/19 10:57 PM   Specimen: Nasopharyngeal Swab  Result Value Ref Range Status   SARS Coronavirus 2 NEGATIVE NEGATIVE Final    Comment: (NOTE) SARS-CoV-2 target nucleic acids are NOT DETECTED. The SARS-CoV-2 RNA is generally detectable in upper and lower respiratory specimens during the acute phase of infection. Negative results do not preclude SARS-CoV-2 infection,  do not rule out co-infections with other pathogens, and should not be used as the sole basis for treatment or other patient management decisions. Negative results must be combined with clinical observations, patient history, and epidemiological information. The expected result is Negative. Fact Sheet for Patients: SugarRoll.be Fact Sheet for Healthcare Providers: https://www.woods-mathews.com/ This test is not yet approved or cleared by the [place] FDA and  has been authorized for detection and/or diagnosis of SARS-CoV-2 by FDA under an Emergency Use Authorization (EUA). This EUA will remain  in effect (meaning this test can be used) for the duration of the COVID-19 declaration under Section 56 4(b)(1) of the Act, 21 U.S.C. section 360bbb-3(b)(1), unless the  authorization is terminated or revoked sooner. Performed at [facility], [place] [address] [place], [place] [address]   Urine culture     Status: Abnormal   Collection Time: 04/29/19  8:00 AM   Specimen: Urine, Random  Result Value Ref Range Status   Specimen Description   Final    URINE, RANDOM Performed at [place] [address] [place], [place] [address]    Special Requests   Final    NONE Performed at [facility], [place] [address] [place], [place] [address]    Culture (A)  Final    <10,000 COLONIES/mL INSIGNIFICANT GROWTH Performed at [place] [address] [place], [place] [address]    Report Status 04/30/2019 FINAL  Final  Surgical pcr screen     Status: None   Collection Time: 04/29/19  9:26 PM   Specimen: Nasal Mucosa; Nasal Swab  Result Value Ref Range Status   MRSA, PCR NEGATIVE NEGATIVE Final   Staphylococcus aureus NEGATIVE NEGATIVE Final    Comment: (NOTE) The Xpert SA Assay (FDA approved for NASAL specimens in patients 51 years of age and older), is one component of a comprehensive surveillance  program. It is not intended to diagnose infection nor to guide or monitor treatment. Performed at [facility], [place] [address], [place], [place] [address]   SARS CORONAVIRUS 2 (TAT 6-24 HRS) Nasopharyngeal Nasopharyngeal Swab     Status: None   Collection Time: 05/06/19 12:59 PM   Specimen: Nasopharyngeal Swab  Result Value Ref Range Status   SARS Coronavirus 2 NEGATIVE NEGATIVE Final    Comment: (NOTE) SARS-CoV-2 target nucleic acids are NOT DETECTED. The SARS-CoV-2 RNA is generally detectable in upper and lower respiratory specimens during the acute phase of infection. Negative results do not preclude SARS-CoV-2 infection, do not rule out co-infections with other pathogens, and should not be used as the sole basis for treatment or other patient management decisions. Negative results must be combined with clinical observations, patient history, and epidemiological information. The expected result is Negative. Fact Sheet for Patients: SugarRoll.be Fact Sheet for Healthcare Providers: https://www.woods-mathews.com/ This test is not yet approved or cleared by the [place] FDA and  has been authorized for detection and/or diagnosis of SARS-CoV-2 by FDA under an Emergency Use Authorization (EUA). This EUA will remain  in effect (meaning this test can be used) for the duration of the COVID-19 declaration under Section 56 4(b)(1) of the Act, 21 U.S.C. section 360bbb-3(b)(1), unless the authorization is terminated or revoked sooner. Performed at [facility], [place] [address] [place], [place] [address]     Procedures/Studies: CT Head Wo Contrast  Result Date: 04/28/2019 CLINICAL DATA:  59 year old male with fall. EXAM: CT HEAD WITHOUT CONTRAST CT CERVICAL SPINE WITHOUT CONTRAST TECHNIQUE: Multidetector CT imaging of the head and cervical spine was performed following the standard protocol without intravenous contrast.  Multiplanar CT image reconstructions of the cervical spine were also generated. COMPARISON:  Head CT dated 03/23/2019. 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 There is otherwise background of age-related atrophy and chronic microvascular ischemic changes. Incidental note of a cavum septum pellucidum and cavum vergae. There is no acute intracranial hemorrhage. No mass effect or midline shift. No extra-axial fluid collection. Vascular: No hyperdense vessel or unexpected calcification. Skull: Normal. Negative for fracture or focal lesion. Sinuses/Orbits: No acute finding. Other: None CT CERVICAL SPINE FINDINGS Alignment: No acute subluxation.  Skull base and vertebrae: No acute fracture. Soft tissues and spinal canal: No prevertebral fluid or swelling. No visible canal hematoma. Disc levels: Multilevel degenerative changes with endplate irregularity and disc space narrowing and spurring. Upper chest: Large right apical subpleural blebs. Other: None IMPRESSION: 1. No acute intracranial hemorrhage. 2. Age-related atrophy and chronic microvascular ischemic changes. Old right MCA territory infarct. 3. No acute/traumatic cervical spine pathology. Multilevel degenerative changes. Electronically Signed   By: Anner Crete M.D.   On: 04/28/2019 22:22   CT Cervical Spine Wo Contrast  Result Date: 04/28/2019 CLINICAL DATA:  59 year old male with fall. EXAM: CT HEAD WITHOUT CONTRAST CT CERVICAL SPINE WITHOUT CONTRAST TECHNIQUE: Multidetector CT imaging of the head and cervical spine was performed following the standard protocol without intravenous contrast. Multiplanar CT image reconstructions of the cervical spine were also generated. COMPARISON:  Head CT dated 03/23/2019. 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 There is otherwise background of age-related atrophy and chronic microvascular ischemic changes. Incidental  note of a cavum septum pellucidum and cavum vergae. There is no acute intracranial hemorrhage. No mass effect or midline shift. No extra-axial fluid collection. Vascular: No hyperdense vessel or unexpected calcification. Skull: Normal. Negative for fracture or focal lesion. Sinuses/Orbits: No acute finding. Other: None CT CERVICAL SPINE FINDINGS Alignment: No acute subluxation. Skull base and vertebrae: No acute fracture. Soft tissues and spinal canal: No prevertebral fluid or swelling. No visible canal hematoma. Disc levels: Multilevel degenerative changes with endplate irregularity and disc space narrowing and spurring. Upper chest: Large right apical subpleural blebs. Other: None IMPRESSION: 1. No acute intracranial hemorrhage. 2. Age-related atrophy and chronic microvascular ischemic changes. Old right MCA territory infarct. 3. No acute/traumatic cervical spine pathology. Multilevel degenerative changes. Electronically Signed   By: Anner Crete M.D.   On: 04/28/2019 22:22   CT PELVIS WO CONTRAST  Result Date: 04/28/2019 CLINICAL DATA:  Right hip pain after injury EXAM: CT PELVIS WITHOUT CONTRAST TECHNIQUE: Multidetector CT imaging of the pelvis was performed following the standard protocol without intravenous contrast. COMPARISON:  Radiograph February 20, 2019 FINDINGS: Urinary Tract: The visualized distal ureters and bladder appear unremarkable. Bowel: No bowel wall thickening, distention or surrounding inflammation identified within the pelvis. Scattered colonic diverticula are noted without diverticulitis. Vascular/Lymphatic: No enlarged pelvic lymph nodes identified. Scattered aortic atherosclerotic calcifications are seen without aneurysmal dilatation. Reproductive:  The prostate is unremarkable. Other: Mild soft tissue swelling seen over the right gluteal musculature. No soft tissue hematoma seen. Musculoskeletal: There is a healed fracture deformity seen at the anterior right iliac wing with small  heterotopic ossification. There is a nondisplaced right intratrochanteric fracture extending through the superior portion of the greater trochanter. The femoral head is still well seated within the acetabulum. There is moderate bilateral hip osteoarthritis. Degenerative changes seen in the lower lumbar spine. There is heterogeneous subtle enlargement of the posterior right gluteal musculature, likely intramuscular edema. IMPRESSION: Acute nondisplaced right intratrochanteric fracture extending through the superior posterior greater trochanter. Right gluteal musculature edema and overlying soft tissue swelling. Prior healed fracture deformity of the anterior right iliac wing with heterotopic ossification. Electronically Signed   By: Prudencio Pair M.D.   On: 04/28/2019 22:23   DG C-Arm 1-60 Min-No Report  Result Date: 04/30/2019 Fluoroscopy was utilized by the requesting physician.  No radiographic interpretation.   ECHOCARDIOGRAM COMPLETE  Result Date: 04/30/2019    ECHOCARDIOGRAM REPORT   Patient Name:   Trevor Williams Date of Exam: 04/30/2019  Medical Rec #:  [id number]     Height:       67.0 in Accession #:    [id number]    Weight:       130.0 lb Date of Birth:  March 31, 1960     BSA:          1.68 m Patient Age:    67 years      BP:           137/80 mmHg Patient Gender: M             HR:           97 bpm. Exam Location:  Inpatient Procedure: 2D Echo, Color Doppler and Cardiac Doppler Indications:    Y05.11 Acute diastolic (congestive) heart failure  History:        Patient has prior history of Echocardiogram examinations, most                 recent 08/07/2018. CHF.  Sonographer:    Raquel Sarna Senior RDCS Referring Phys: [id number] [facility]  Sonographer Comments: Technically difficult due to rib shadowing, patient unable to turn due to hip fracture. IMPRESSIONS  1. LV endocardial borders are poorly visualized and cannot rule out focal wall motion abonrmalities on this study. LVF appears at least mlidly reduced. Recommend  definity contrast study to get accurate assessment of wall motion and EF. The left ventrical is not well visualized. The left ventricular internal cavity size was moderately dilated. Left ventricular diastolic parameters are consistent with Grade I diastolic dysfunction (impaired relaxation).  2. Right ventricular systolic function is normal. The right ventricular size is normal. There is normal pulmonary artery systolic pressure.  3. Right atrial size was mildly dilated.  4. The mitral valve is normal in structure and function. no evidence of mitral valve regurgitation. No evidence of mitral stenosis.  5. The aortic valve is tricuspid. Aortic valve regurgitation is not visualized. Mild aortic valve sclerosis is present, with no evidence of aortic valve stenosis.  6. The inferior vena cava is normal in size with <50% respiratory variability, suggesting right atrial pressure of 8 mmHg. Conclusion(s)/Recomendation(s): Poor windows for evaluation of left ventricular function by transthoracic echocardiography. Would recommend definity contrast study to get accurate assessment of wall motion and EF. FINDINGS  Left Ventricle: LV endocardial borders are poorly visualized and cannot rule out focal wall motio n abonrmalities on this study. LVF appears at least mlidly reduced. Recommend definity contrast study to get accurate assessment of wall motion and EF.The left ventricle is not well visualized. The left ventricular internal cavity size was moderately dilated. There is no left ventricular hypertrophy. Left ventricular diastolic parameters are consistent with Grade I diastolic dysfunction (impaired relaxation). Right Ventricle: The right ventricular size is normal. No increase in right ventricular wall thickness. Right ventricular systolic function is normal. There is normal pulmonary artery systolic pressure. The tricuspid regurgitant velocity is 2.25 m/s, and  with an assumed right atrial pressure of 8 mmHg, the  estimated right ventricular systolic pressure is 56.7 mmHg. Left Atrium: Left atrial size was normal in size. Right Atrium: Right atrial size was mildly dilated. Pericardium: There is no evidence of pericardial effusion. Mitral Valve: The mitral valve is normal in structure and function. There is mild thickening of the mitral valve leaflet(s). Normal mobility of the mitral valve leaflets. Mild mitral annular calcification. No evidence of mitral valve regurgitation. No evidence of mitral valve stenosis. Tricuspid Valve: The tricuspid valve is normal in structure. Tricuspid  valve regurgitation is trivial. No evidence of tricuspid stenosis. Aortic Valve: The aortic valve is tricuspid. Aortic valve regurgitation is not visualized. Mild aortic valve sclerosis is present, with no evidence of aortic valve stenosis. Pulmonic Valve: The pulmonic valve was normal in structure. Pulmonic valve regurgitation is not visualized. No evidence of pulmonic stenosis. Aorta: The aortic root is normal in size and structure. Venous: The inferior vena cava is normal in size with less than 50% respiratory variability, suggesting right atrial pressure of 8 mmHg. The inferior vena cava and the hepatic vein show a normal flow pattern. IAS/Shunts: The interatrial septum appears to be lipomatous. No atrial level shunt detected by color flow Doppler.  LEFT VENTRICLE PLAX 2D LVIDd:         6.00 cm       Diastology LVIDs:         4.90 cm       LV e' lateral:   8.27 cm/s LV PW:         0.70 cm       LV E/e' lateral: 4.2 LV IVS:        0.70 cm       LV e' medial:    7.18 cm/s LVOT diam:     2.10 cm       LV E/e' medial:  4.9 LV SV:         46.41 ml LV SV Index:   40.34 LVOT Area:     3.46 cm  LV Volumes (MOD) LV area d, A2C:    25.20 cm LV area d, A4C:    30.80 cm LV area s, A2C:    22.40 cm LV area s, A4C:    24.20 cm LV major d, A2C:   7.44 cm LV major d, A4C:   8.30 cm LV major s, A2C:   7.59 cm LV major s, A4C:   7.64 cm LV vol d, MOD A2C:  73.0 ml LV vol d, MOD A4C: 94.9 ml LV vol s, MOD A2C: 56.9 ml LV vol s, MOD A4C: 64.8 ml LV SV MOD A2C:     16.1 ml LV SV MOD A4C:     94.9 ml LV SV MOD BP:      26.9 ml RIGHT VENTRICLE RV S prime:     14.50 cm/s TAPSE (M-mode): 1.9 cm LEFT ATRIUM             Index       RIGHT ATRIUM           Index LA diam:        3.10 cm 1.84 cm/m  RA Area:     19.00 cm LA Vol (A2C):   50.0 ml 29.69 ml/m RA Volume:   55.80 ml  33.14 ml/m LA Vol (A4C):   55.2 ml 32.78 ml/m LA Biplane Vol: 52.9 ml 31.41 ml/m  AORTIC VALVE LVOT Vmax:   79.60 cm/s LVOT Vmean:  59.600 cm/s LVOT VTI:    0.134 m  AORTA Ao Root diam: 3.50 cm MITRAL VALVE                        TRICUSPID VALVE MV Area (PHT): 3.60 cm             TR Peak grad:   20.2 mmHg MV Decel Time: 211 msec             TR Vmax:        225.00 cm/s  MV E velocity: 35.10 cm/s 103 cm/s MV A velocity: 60.00 cm/s 70.3 cm/s SHUNTS MV E/A ratio:  0.59       1.5       Systemic VTI:  0.13 m                                     Systemic Diam: 2.10 cm Fransico Him MD Electronically signed by Fransico Him MD Signature Date/Time: 04/30/2019/12:18:27 PM    Final    DG HIP OPERATIVE UNILAT WITH PELVIS RIGHT  Result Date: 04/30/2019 CLINICAL DATA:  Right hip fracture. EXAM: OPERATIVE right HIP (WITH PELVIS IF PERFORMED) 3 VIEWS TECHNIQUE: Fluoroscopic spot image(s) were submitted for interpretation post-operatively. Radiation exposure index: 3.8955 mGy. COMPARISON:  None. FINDINGS: Three intraoperative fluoroscopic images of the right hip demonstrate the patient to be status post internal fixation of nondisplaced intertrochanteric fracture of proximal right femur. Intramedullary rod fixation is noted as well of the right femur. IMPRESSION: Status post internal fixation of proximal right femoral intertrochanteric fracture. Electronically Signed   By: Marijo Conception M.D.   On: 04/30/2019 13:57   DG Hips Bilat W or Wo Pelvis 3-4 Views  Result Date: 04/28/2019 CLINICAL DATA:  [place] from standing,  right hip pain EXAM: DG HIP (WITH OR WITHOUT PELVIS) 3-4V BILAT COMPARISON:  02/20/2019 FINDINGS: Frontal view of the pelvis as well as frontal and frogleg lateral views of the right hip are obtained. Cassette artifact limits evaluation. There are numerous skin folds overlying the right hip. However, there is a subtle lucency perpendicular to the trabecular markings in the inter trochanteric region of the central aspect of the proximal right femur, seen on both the frontal and frogleg lateral views. I cannot exclude a nondisplaced fracture, and CT or MRI may be useful for further evaluation. No other acute displaced fractures. The hips are well aligned. There remainder of the bony pelvis is normal. IMPRESSION: 1. Possible nondisplaced intertrochanteric right hip fracture as above. CT or MRI recommended for further evaluation. Electronically Signed   By: Randa Ngo M.D.   On: 04/28/2019 20:16   ECHOCARDIOGRAM LIMITED  Result Date: 04/30/2019    ECHOCARDIOGRAM LIMITED REPORT   Patient Name:   Trevor Williams Date of Exam: 04/30/2019 Medical Rec #:  [id number]     Height:       67.0 in Accession #:    [id number]    Weight:       130.0 lb Date of Birth:  April 07, 1960     BSA:          1.68 m Patient Age:    28 years      BP:           145/96 mmHg Patient Gender: M             HR:           75 bpm. Exam Location:  Inpatient Procedure: 2D Echo and Intracardiac Opacification Agent Indications:    Evaluation LV function  History:        Patient has prior history of Echocardiogram examinations, most                 recent 04/29/2018.  Sonographer:    Raquel Sarna Senior RDCS Referring Phys: [place] Comments: IMPRESSIONS  1. Poor definity contrast study. LVF appears better in the apical views than in the parasternal views. Overall  EF is estimated at 30% with global hypokinesis. Consider cardiac MRI if indicated to get accurate EF. FINDINGS  Left Ventricle: Poor definity contrast study. LVF appears better in  the apical views than in the parasternal views. Overall EF is estimated at 30% with global hypokinesis. Consider cardiac MRI if indicated to get accurate EF. Definity contrast agent was  given IV to delineate the left ventricular endocardial borders. Fransico Him MD Electronically signed by Fransico Him MD Signature Date/Time: 04/30/2019/1:36:07 PM    Final       Labs: BNP (last 3 results) Recent Labs    06/01/18 1800 07/28/18 2012 04/29/19 0150  BNP 428.8* 412.6* 720.9*   Basic Metabolic Panel: Recent Labs  Lab 05/01/19 0438 05/02/19 0338 05/03/19 0335  NA 135 135 137  K 4.1 3.3* 4.0  CL 103 107 108  CO2 23 21* 21*  GLUCOSE 118* 96 90  BUN '11 15 13  '$ CREATININE 0.72 0.66 0.51*  CALCIUM 8.3* 8.1* 8.3*   Liver Function Tests: No results for input(s): AST, ALT, ALKPHOS, BILITOT, PROT, ALBUMIN in the last 168 hours.  No results for input(s): LIPASE, AMYLASE in the last 168 hours. No results for input(s): AMMONIA in the last 168 hours. CBC: Recent Labs  Lab 05/01/19 0438 05/02/19 0338 05/03/19 0335  WBC 7.4 4.2 3.7*  HGB 14.0 13.7 12.3*  HCT 41.9 42.4 38.8*  MCV 109.1* 112.2* 112.5*  PLT 67* 60* 73*   Cardiac Enzymes: No results for input(s): CKTOTAL, CKMB, CKMBINDEX, TROPONINI in the last 168 hours. BNP: Invalid input(s): POCBNP CBG: No results for input(s): GLUCAP in the last 168 hours. D-Dimer No results for input(s): DDIMER in the last 72 hours. Hgb A1c No results for input(s): HGBA1C in the last 72 hours. Lipid Profile No results for input(s): CHOL, HDL, LDLCALC, TRIG, CHOLHDL, LDLDIRECT in the last 72 hours. Thyroid function studies No results for input(s): TSH, T4TOTAL, T3FREE, THYROIDAB in the last 72 hours.  Invalid input(s): FREET3 Anemia work up No results for input(s): VITAMINB12, FOLATE, FERRITIN, TIBC, IRON, RETICCTPCT in the last 72 hours. Urinalysis    Component Value Date/Time   COLORURINE YELLOW 04/29/2019 0800   APPEARANCEUR CLEAR  04/29/2019 0800   LABSPEC 1.021 04/29/2019 0800   PHURINE 5.0 04/29/2019 0800   GLUCOSEU NEGATIVE 04/29/2019 0800   HGBUR NEGATIVE 04/29/2019 0800   BILIRUBINUR NEGATIVE 04/29/2019 0800   KETONESUR NEGATIVE 04/29/2019 0800   PROTEINUR NEGATIVE 04/29/2019 0800   NITRITE NEGATIVE 04/29/2019 0800   LEUKOCYTESUR NEGATIVE 04/29/2019 0800   Sepsis Labs Invalid input(s): PROCALCITONIN,  WBC,  LACTICIDVEN Microbiology Recent Results (from the past 240 hour(s))  SARS CORONAVIRUS 2 (TAT 6-24 HRS) Nasopharyngeal Nasopharyngeal Swab     Status: None   Collection Time: 04/28/19 10:57 PM   Specimen: Nasopharyngeal Swab  Result Value Ref Range Status   SARS Coronavirus 2 NEGATIVE NEGATIVE Final    Comment: (NOTE) SARS-CoV-2 target nucleic acids are NOT DETECTED. The SARS-CoV-2 RNA is generally detectable in upper and lower respiratory specimens during the acute phase of infection. Negative results do not preclude SARS-CoV-2 infection, do not rule out co-infections with other pathogens, and should not be used as the sole basis for treatment or other patient management decisions. Negative results must be combined with clinical observations, patient history, and epidemiological information. The expected result is Negative. Fact Sheet for Patients: SugarRoll.be Fact Sheet for Healthcare Providers: https://www.woods-mathews.com/ This test is not yet approved or cleared by the [place] and  has been authorized  for detection and/or diagnosis of SARS-CoV-2 by FDA under an Emergency Use Authorization (EUA). This EUA will remain  in effect (meaning this test can be used) for the duration of the COVID-19 declaration under Section 56 4(b)(1) of the Act, 21 U.S.C. section 360bbb-3(b)(1), unless the authorization is terminated or revoked sooner. Performed at [facility], [place] [address], [place], [place] [address]   Urine culture     Status:  Abnormal   Collection Time: 04/29/19  8:00 AM   Specimen: Urine, Random  Result Value Ref Range Status   Specimen Description   Final    URINE, RANDOM Performed at [place] [address] [place], [place] [address]    Special Requests   Final    NONE Performed at [facility], [place] [address] [place], [place] [address]    Culture (A)  Final    <10,000 COLONIES/mL INSIGNIFICANT GROWTH Performed at [place] [address] [place], [place] [address]    Report Status 04/30/2019 FINAL  Final  Surgical pcr screen     Status: None   Collection Time: 04/29/19  9:26 PM   Specimen: Nasal Mucosa; Nasal Swab  Result Value Ref Range Status   MRSA, PCR NEGATIVE NEGATIVE Final   Staphylococcus aureus NEGATIVE NEGATIVE Final    Comment: (NOTE) The Xpert SA Assay (FDA approved for NASAL specimens in patients 39 years of age and older), is one component of a comprehensive surveillance program. It is not intended to diagnose infection nor to guide or monitor treatment. Performed at [facility], [place] [address] [place], [place] [address]   SARS CORONAVIRUS 2 (TAT 6-24 HRS) Nasopharyngeal Nasopharyngeal Swab     Status: None   Collection Time: 05/06/19 12:59 PM   Specimen: Nasopharyngeal Swab  Result Value Ref Range Status   SARS Coronavirus 2 NEGATIVE NEGATIVE Final    Comment: (NOTE) SARS-CoV-2 target nucleic acids are NOT DETECTED. The SARS-CoV-2 RNA is generally detectable in upper and lower respiratory specimens during the acute phase of infection. Negative results do not preclude SARS-CoV-2 infection, do not rule out co-infections with other pathogens, and should not be used as the sole basis for treatment or other patient management decisions. Negative results must be combined with clinical observations, patient history, and epidemiological information. The expected result is Negative. Fact Sheet for  Patients: SugarRoll.be Fact Sheet for Healthcare Providers: https://www.woods-mathews.com/ This test is not yet approved or cleared by the [place] FDA and  has been authorized for detection and/or diagnosis of SARS-CoV-2 by FDA under an Emergency Use Authorization (EUA). This EUA will remain  in effect (meaning this test can be used) for the duration of the COVID-19 declaration under Section 56 4(b)(1) of the Act, 21 U.S.C. section 360bbb-3(b)(1), unless the authorization is terminated or revoked sooner. Performed at [facility], [place] [address] [place], [place] [address]      Time coordinating discharge: 25 minutes.  SIGNED: Antonieta Pert, MD  [facility] 05/07/2019, 9:28 AM  If 7PM-7AM, please contact night-coverage [email or web address].

## 2019-05-02 NOTE — Progress Notes (Signed)
   Subjective: 2 Days Post-Op Procedure(s) (LRB): INTRAMEDULLARY (IM) NAIL FEMORAL (Right) Patient reports pain as moderate.   Patient seen in rounds for Dr. Alvan Dame. Patient is well, and has had no acute complaints or problems other than discomfort in the right hip. No acute events overnight. Denies CP, SHOB, N/V.  We will continue therapy today.   Objective: Vital signs in last 24 hours: Temp:  [97.5 F (36.4 C)-98.2 F (36.8 C)] 97.5 F (36.4 C) (02/12 1356) Pulse Rate:  [74-91] 78 (02/12 1356) Resp:  [12-18] 12 (02/12 1356) BP: (99-123)/(71-85) 121/73 (02/12 1356) SpO2:  [95 %-100 %] 98 % (02/12 1356)  Intake/Output from previous day:  Intake/Output Summary (Last 24 hours) at 05/02/2019 1522 Last data filed at 05/02/2019 1426 Gross per 24 hour  Intake 1705 ml  Output 880 ml  Net 825 ml     Intake/Output this shift: Total I/O In: 340 [P.O.:340] Out: -   Labs: Recent Labs    04/30/19 0753 05/01/19 0438 05/02/19 0338  HGB 14.3 14.0 13.7   Recent Labs    05/01/19 0438 05/02/19 0338  WBC 7.4 4.2  RBC 3.84* 3.78*  HCT 41.9 42.4  PLT 67* 60*   Recent Labs    05/01/19 0438 05/02/19 0338  NA 135 135  K 4.1 3.3*  CL 103 107  CO2 23 21*  BUN 11 15  CREATININE 0.72 0.66  GLUCOSE 118* 96  CALCIUM 8.3* 8.1*   No results for input(s): LABPT, INR in the last 72 hours.  Exam: General - Patient is Alert and Oriented Extremity - Neurologically intact Sensation intact distally Intact pulses distally Dorsiflexion/Plantar flexion intact Dressing - dressing C/D/I Motor Function - intact, moving foot and toes well on exam.   Past Medical History:  Diagnosis Date  . Abnormal liver function tests   . Acute respiratory failure ([place])    a. 07/2018 requiring intubation - CAP/CHF.  Marland Kitchen Alcohol abuse   . Biventricular heart failure ([place])   . Cancer ([place])    skin (left hand)  . Delirium    a. h/o delirium while admitted  . Emphysema of lung ([place])   . Homelessness     . Macrocytic anemia   . Mild CAD    a. cath 07/2018 60% ostial ramus otherwise OK.  . Mitral regurgitation   . NICM (nonischemic cardiomyopathy) ([place])   . Protein calorie malnutrition ([place])   . Tricuspid regurgitation     Assessment/Plan: 2 Days Post-Op Procedure(s) (LRB): INTRAMEDULLARY (IM) NAIL FEMORAL (Right) Principal Problem:   Hip fracture (HCC) Active Problems:   Homeless   Simple chronic bronchitis (HCC)   Tobacco use disorder   Chronic systolic heart failure (HCC)   Alcoholic intoxication without complication (HCC)  Estimated body mass index is 20.36 kg/m as calculated from the following:   Height as of this encounter: 5\' 7"  (1.702 m).   Weight as of this encounter: 59 kg. Advance diet Up with therapy  DVT Prophylaxis - Aspirin Weight bearing as tolerated.  Discharge disposition per medicine team. From an orthopedic standpoint, patient is okay to discharge. Leave aquacel in place until follow up. WBAT to the RLE. Aspirin 81 mg BID x4 weeks. Follow up in the office in 2 weeks with Dr. Alvan Dame.   Griffith Citron, PA-C Orthopedic Surgery [phone] 05/02/2019, 3:22 PM

## 2019-05-03 LAB — CBC
HCT: 38.8 % — ABNORMAL LOW (ref 39.0–52.0)
Hemoglobin: 12.3 g/dL — ABNORMAL LOW (ref 13.0–17.0)
MCH: 35.7 pg — ABNORMAL HIGH (ref 26.0–34.0)
MCHC: 31.7 g/dL (ref 30.0–36.0)
MCV: 112.5 fL — ABNORMAL HIGH (ref 80.0–100.0)
Platelets: 73 K/uL — ABNORMAL LOW (ref 150–400)
RBC: 3.45 MIL/uL — ABNORMAL LOW (ref 4.22–5.81)
RDW: 12.6 % (ref 11.5–15.5)
WBC: 3.7 K/uL — ABNORMAL LOW (ref 4.0–10.5)
nRBC: 0 % (ref 0.0–0.2)

## 2019-05-03 LAB — BASIC METABOLIC PANEL
Anion gap: 8 (ref 5–15)
BUN: 13 mg/dL (ref 6–20)
CO2: 21 mmol/L — ABNORMAL LOW (ref 22–32)
Calcium: 8.3 mg/dL — ABNORMAL LOW (ref 8.9–10.3)
Chloride: 108 mmol/L (ref 98–111)
Creatinine, Ser: 0.51 mg/dL — ABNORMAL LOW (ref 0.61–1.24)
GFR calc Af Amer: >60 mL/min (ref >60)
GFR calc non Af Amer: >60 mL/min (ref >60)
Glucose, Bld: 90 mg/dL (ref 70–99)
Potassium: 4 mmol/L (ref 3.5–5.1)
Sodium: 137 mmol/L (ref 135–145)

## 2019-05-03 NOTE — Progress Notes (Signed)
PROGRESS NOTE    Trevor Williams  PVV:[id number] DOB: 1961-01-30 DOA: 04/28/2019 PCP: Elsie Stain, MD   Brief Narrative: 59 year old M with history of alcohol abuse/DTs, chronic systolic CHF/an ICM, history of respiratory failure due to pneumonia and CHF, status post intubation admitted with right hip pain after he tripped over a rock and fell on the right hip.In the ED CT head, neck no acute finding, CT abdomen pelvis showed intertrochanteric fracture right hip.  Blood work with alcohol level 330, platelet 80,000. Patient admitted for right hip intertrochanteric fracture.  Underwent ORIF 2/10. Patient doing well postoperatively.  Seen by PT OT and has advised skilled nursing facility. He has been deemed stable for discharge to skilled nursing facility once bed available.  Subjective: Overnight no acute events.  Doing well on room air, afebrile T-max 97.9. Resting comfortably in the bedside chair.  He is dressed up.  Assessment & Plan:   Right hip intertrochanteric fracture- 2/2 fall, s/p ORIF 2/10. Continue on PT OT, pain control, aspirin 81 twice daily for 4 weeks for DVT prophylaxis as per orthopedics.  Stable from orthopedic standpoint.  Chronic congestive heart failure with systolic dysfunction/NICM: Euvolemic. Echo reviewed from 08/03/2025 LV moderately dilated with severe systolic dysfunction EF 15 to 25%, severely reduced RVEF.   Echo this admit with improved EF to 30%.  He has follow-up arranged to CHF clinic on 3/18 Cont home  Coreg 3.125, Crestor 10 mg, Entresto 25-26.  Off IV fluid.    Alcohol abuse with alcohol intoxication with history of DTs, alcohol level 331 on admission.  continue on alcohol withdrawal protocol w/ CIWA, cont  thiamine and folate and IV fluids. Complted Librium taper  Chronic thrombocytopenia: Suspect in the setting of chronic alcohol abuse.  Monitor platelet count. They were as low as 32k in 07/2018, Transfuse as needed. Will monitor closely. Recent Labs   Lab 04/29/19 0150 04/30/19 0753 05/01/19 0438 05/02/19 0338 05/03/19 0335  PLT 75* 62* 67* 60* 73*   Chronic bronchitis: Doing well on room air.  Tobacco abuse cont nicotine gum as needed and nicotine patch.  Social situation: Homeless. Living in shelter.  COVID 19 NEG  Nutrition: Nutrition Problem: Increased nutrient needs Etiology: social / environmental circumstances, hip fracture(ETOH abuse) Signs/Symptoms: estimated needs Interventions: Ensure Enlive (each supplement provides 350kcal and 20 grams of protein), MVI, Magic cup  Body mass index is 20.36 kg/m.   DVT prophylaxis: SCD-aspirin 81 mg twice daily per Ortho. Code Status:FULL Family Communication: plan of care discussed with patient at bedside. Disposition Plan: Patient is from: Living in shelter/insured-without SNF benefit Anticipated d/c- SNF Barriers to d/c or conditions that needs to be met prior to d/c: Medically stable.  Awaiting SNF bed.  Consultants: ortho, cardiology  Procedures: 04/30/19 Open reduction internal fixation of right intertrochanteric femur fracture utilizing a Biomet Affixus nail, 11 x 180 mm, with 130-degree lag screw  Microbiology:see note Antimicrobials: Anti-infectives (From admission, onward)   Start     Dose/Rate Route Frequency Ordered Stop   04/30/19 1830  ceFAZolin (ANCEF) IVPB 2g/100 mL premix     2 g 200 mL/hr over 30 Minutes Intravenous Every 6 hours 04/30/19 1520 05/01/19 0054   04/30/19 1130  ceFAZolin (ANCEF) IVPB 2g/100 mL premix     2 g 200 mL/hr over 30 Minutes Intravenous On call to O.R. 04/30/19 1125 04/30/19 1259      Medications: Scheduled Meds: . aspirin EC  81 mg Oral BID  . carvedilol  3.125 mg Oral BID  WC  . chlordiazePOXIDE  25 mg Oral Q0600  . docusate sodium  100 mg Oral BID  . feeding supplement (ENSURE ENLIVE)  237 mL Oral TID BM  . ferrous sulfate  325 mg Oral TID PC  . folic acid  1 mg Oral Daily  . gabapentin  300 mg Oral TID  .  multivitamin with minerals  1 tablet Oral Daily  . nicotine  14 mg Transdermal Daily  . pantoprazole  40 mg Oral Daily  . rosuvastatin  10 mg Oral q1800  . sacubitril-valsartan  1 tablet Oral BID  . sodium chloride flush  3 mL Intravenous Q12H  . thiamine  100 mg Oral Daily   Or  . thiamine  100 mg Intravenous Daily   Continuous Infusions: . sodium chloride    . methocarbamol (ROBAXIN) IV      Objective: Vitals:   05/02/19 0735 05/02/19 1356 05/02/19 2111 05/03/19 0509  BP: 99/72 121/73 130/78 (!) 144/82  Pulse: 91 78 86 96  Resp: _0 Temp:  (!) 97.5 F (36.4 C) 97.9 F (36.6 C) 97.7 F (36.5 C)  TempSrc:  Oral Oral Oral  SpO2:  98% 96% 94%  Weight:      Height:        Intake/Output Summary (Last 24 hours) at 05/03/2019 0811 Last data filed at 05/03/2019 0600 Gross per 24 hour  Intake 883 ml  Output 0 ml  Net 883 ml   Filed Weights   04/28/19 1930 04/29/19 0123  Weight: 59 kg 59 kg   Weight change:   Body mass index is 20.36 kg/m.  Intake/Output from previous day: 02/12 0701 - 02/13 0700 In: 1003 [P.O.:1000; I.V.:3] Out: 0  Intake/Output this shift: No intake/output data recorded.  Examination: General exam: AAO X3, not in acute distress, HEENT:Oral mucosa moist, Ear/Nose WNL grossly, dentition normal. Respiratory system: bilaterally clear, no wheezing or crackles,No use of accessory muscle Cardiovascular system: S1 & S2 +, No JVD,. Gastrointestinal system: Abdomen soft, NT,ND, BS+ Nervous System:Alert, awake, moving extremities and grossly nonfocal Extremities: No edema, distal peripheral pulses palpable.  Right hip surgical site with Aquacel dressing. Skin: No rashes,no icterus. MSK: Normal muscle bulk,tone, power   Data Reviewed: I have personally reviewed following labs and imaging studies  CBC: Recent Labs  Lab 04/28/19 1934 04/28/19 1934 04/29/19 0150 04/30/19 0753 05/01/19 0438 05/02/19 0338 05/03/19 0335  WBC 4.9   < > 6.7  7.4 7.4 4.2 3.7*  NEUTROABS 2.3  --   --   --   --   --   --   HGB 15.2   < > 13.3 14.3 14.0 13.7 12.3*  HCT 46.4   < > 41.4 42.6 41.9 42.4 38.8*  MCV 108.7*   < > 111.6* 109.2* 109.1* 112.2* 112.5*  PLT 80*   < > 75* 62* 67* 60* 73*   < > = values in this interval not displayed.   Basic Metabolic Panel: Recent Labs  Lab 04/28/19 2056 04/29/19 0132 04/29/19 0150 05/01/19 0438 05/02/19 0338 05/03/19 0335  NA 142  --  141 135 135 137  K 4.0  --  4.7 4.1 3.3* 4.0  CL 105  --  109 103 107 108  CO2 27  --  24 23 21* 21*  GLUCOSE 90  --  92 118* 96 90  BUN 7  --  _1 CREATININE 0.67  --  0.71 0.72 0.66 0.51*  CALCIUM 9.1  --  8.1* 8.3* 8.1* 8.3*  MG  --  2.0  --   --   --   --   PHOS  --  4.7*  --   --   --   --    GFR: Estimated Creatinine Clearance: 85 mL/min (A) (by C-G formula based on SCr of 0.51 mg/dL (L)). Liver Function Tests: Recent Labs  Lab 04/28/19 2056  AST 65*  ALT 31  ALKPHOS 99  BILITOT 0.6  PROT 7.8  ALBUMIN 3.9   No results for input(s): LIPASE, AMYLASE in the last 168 hours. No results for input(s): AMMONIA in the last 168 hours. Coagulation Profile: No results for input(s): INR, PROTIME in the last 168 hours. Cardiac Enzymes: No results for input(s): CKTOTAL, CKMB, CKMBINDEX, TROPONINI in the last 168 hours. BNP (last 3 results) No results for input(s): PROBNP in the last 8760 hours. HbA1C: No results for input(s): HGBA1C in the last 72 hours. CBG: No results for input(s): GLUCAP in the last 168 hours. Lipid Profile: No results for input(s): CHOL, HDL, LDLCALC, TRIG, CHOLHDL, LDLDIRECT in the last 72 hours. Thyroid Function Tests: No results for input(s): TSH, T4TOTAL, FREET4, T3FREE, THYROIDAB in the last 72 hours. Anemia Panel: No results for input(s): VITAMINB12, FOLATE, FERRITIN, TIBC, IRON, RETICCTPCT in the last 72 hours. Sepsis Labs: No results for input(s): PROCALCITON, LATICACIDVEN in the last 168 hours.  Recent Results  (from the past 240 hour(s))  SARS CORONAVIRUS 2 (TAT 6-24 HRS) Nasopharyngeal Nasopharyngeal Swab     Status: None   Collection Time: 04/28/19 10:57 PM   Specimen: Nasopharyngeal Swab  Result Value Ref Range Status   SARS Coronavirus 2 NEGATIVE NEGATIVE Final    Comment: (NOTE) SARS-CoV-2 target nucleic acids are NOT DETECTED. The SARS-CoV-2 RNA is generally detectable in upper and lower respiratory specimens during the acute phase of infection. Negative results do not preclude SARS-CoV-2 infection, do not rule out co-infections with other pathogens, and should not be used as the sole basis for treatment or other patient management decisions. Negative results must be combined with clinical observations, patient history, and epidemiological information. The expected result is Negative. Fact Sheet for Patients: SugarRoll.be Fact Sheet for Healthcare Providers: https://www.woods-mathews.com/ This test is not yet approved or cleared by the [place] FDA and  has been authorized for detection and/or diagnosis of SARS-CoV-2 by FDA under an Emergency Use Authorization (EUA). This EUA will remain  in effect (meaning this test can be used) for the duration of the COVID-19 declaration under Section 56 4(b)(1) of the Act, 21 U.S.C. section 360bbb-3(b)(1), unless the authorization is terminated or revoked sooner. Performed at [facility], [place] [address], [place], [place] [address]   Urine culture     Status: Abnormal   Collection Time: 04/29/19  8:00 AM   Specimen: Urine, Random  Result Value Ref Range Status   Specimen Description   Final    URINE, RANDOM Performed at [place] [address] [place], [place] [address]    Special Requests   Final    NONE Performed at [facility], [place] [address] [place], [place] [address]    Culture (A)  Final    <10,000 COLONIES/mL INSIGNIFICANT GROWTH  Performed at [place] [address], [place], [place] [address]    Report Status 04/30/2019 FINAL  Final  Surgical pcr screen     Status: None   Collection Time: 04/29/19  9:26 PM   Specimen: Nasal  Mucosa; Nasal Swab  Result Value Ref Range Status   MRSA, PCR NEGATIVE NEGATIVE Final   Staphylococcus aureus NEGATIVE NEGATIVE Final    Comment: (NOTE) The Xpert SA Assay (FDA approved for NASAL specimens in patients 15 years of age and older), is one component of a comprehensive surveillance program. It is not intended to diagnose infection nor to guide or monitor treatment. Performed at [facility], [place] [address], [place], [place] [address]       Radiology Studies: No results found.   LOS: 4 days   Time spent: More than 50% of that time was spent in counseling and/or coordination of care.  Antonieta Pert, MD [facility]  05/03/2019, 8:11 AM

## 2019-05-04 NOTE — Plan of Care (Signed)
  Problem: Clinical Measurements: Goal: Will remain free from infection Outcome: Progressing   Problem: Clinical Measurements: Goal: Diagnostic test results will improve Outcome: Progressing   Problem: Clinical Measurements: Goal: Respiratory complications will improve Outcome: Progressing   Problem: Clinical Measurements: Goal: Cardiovascular complication will be avoided Outcome: Progressing   Problem: Health Behavior/Discharge Planning: Goal: Ability to manage health-related needs will improve Outcome: Progressing   Problem: Activity: Goal: Risk for activity intolerance will decrease Outcome: Progressing   Problem: Coping: Goal: Level of anxiety will decrease Outcome: Progressing

## 2019-05-04 NOTE — Progress Notes (Signed)
Pt threatened to leave room and go smoke a cigarette. Rn educated pt he could not dot this and if he did he would be leaving against medical advice. Rn also educated pt he has nicotine patch on as well.

## 2019-05-04 NOTE — Progress Notes (Signed)
PROGRESS NOTE    Trevor Williams  PZW:[id number] DOB: 12-24-1960 DOA: 04/28/2019 PCP: Elsie Stain, MD   Brief Narrative: 59 year old M with history of alcohol abuse/DTs, chronic systolic CHF/an ICM, history of respiratory failure due to pneumonia and CHF, status post intubation admitted with right hip pain after he tripped over a rock and fell on the right hip.In the ED CT head, neck no acute finding, CT abdomen pelvis showed intertrochanteric fracture right hip.  Blood work with alcohol level 330, platelet 80,000. Patient admitted for right hip intertrochanteric fracture.  Underwent ORIF 2/10. Patient doing well postoperatively.  Seen by PT OT and has advised skilled nursing facility. He has been deemed stable for discharge to skilled nursing facility once bed available.  Subjective:  No acute events.  Remains afebrile.  Wanting to smoke he is on nicotine patch and gum.   no change in plan.  Assessment & Plan:   Right hip intertrochanteric fracture- 2/2 fall, s/p ORIF 2/10. Continue on PT OT, pain control, aspirin 81 twice daily for 4 weeks for DVT prophylaxis as per orthopedics.  Stable from orthopedic standpoint.  Chronic congestive heart failure with systolic dysfunction/NICM: Euvolemic. Echo reviewed from 08/03/2025 LV moderately dilated with severe systolic dysfunction EF 15 to 25%, severely reduced RVEF.   Echo this admit with improved EF to 30%.  He has follow-up arranged to CHF clinic on 3/18 Cont home  Coreg 3.125, Crestor 10 mg, Entresto 25-26.  Off IV fluid.    Alcohol abuse with alcohol intoxication with history of DTs, alcohol level 331 on admission.  continue on alcohol withdrawal protocol w/ CIWA, cont  thiamine and folate and IV fluids. Complted Librium taper  Chronic thrombocytopenia: Suspect in the setting of chronic alcohol abuse.  Monitor platelet count. They were as low as 32k in 07/2018, Transfuse as needed. Will monitor closely. Recent Labs  Lab 04/29/19 0150  04/30/19 0753 05/01/19 0438 05/02/19 0338 05/03/19 0335  PLT 75* 62* 67* 60* 73*   Chronic bronchitis: Doing well on room air.  Tobacco abuse cont nicotine gum as needed and nicotine patch.  Social situation: Homeless. Living in shelter.  COVID 19 NEG No change in plan of care awaiting on placement.  Nutrition: Nutrition Problem: Increased nutrient needs Etiology: social / environmental circumstances, hip fracture(ETOH abuse) Signs/Symptoms: estimated needs Interventions: Ensure Enlive (each supplement provides 350kcal and 20 grams of protein), MVI, Magic cup  Body mass index is 20.36 kg/m.   DVT prophylaxis: SCD-aspirin 81 mg twice daily per Ortho. Code Status:FULL Family Communication: plan of care discussed with patient at bedside. Disposition Plan: Patient is from: Living in shelter/insured-without SNF benefit Anticipated d/c- SNF Barriers to d/c or conditions that needs to be met prior to d/c: Medically stable.  Awaiting SNF bed.  Consultants: ortho, cardiology  Procedures: 04/30/19 Open reduction internal fixation of right intertrochanteric femur fracture utilizing a Biomet Affixus nail, 11 x 180 mm, with 130-degree lag screw  Microbiology:see note Antimicrobials: Anti-infectives (From admission, onward)   Start     Dose/Rate Route Frequency Ordered Stop   04/30/19 1830  ceFAZolin (ANCEF) IVPB 2g/100 mL premix     2 g 200 mL/hr over 30 Minutes Intravenous Every 6 hours 04/30/19 1520 05/01/19 0054   04/30/19 1130  ceFAZolin (ANCEF) IVPB 2g/100 mL premix     2 g 200 mL/hr over 30 Minutes Intravenous On call to O.R. 04/30/19 1125 04/30/19 1259      Medications: Scheduled Meds: . aspirin EC  81 mg Oral  BID  . carvedilol  3.125 mg Oral BID WC  . chlordiazePOXIDE  25 mg Oral Q0600  . docusate sodium  100 mg Oral BID  . feeding supplement (ENSURE ENLIVE)  237 mL Oral TID BM  . ferrous sulfate  325 mg Oral TID PC  . folic acid  1 mg Oral Daily  . gabapentin   300 mg Oral TID  . multivitamin with minerals  1 tablet Oral Daily  . nicotine  14 mg Transdermal Daily  . pantoprazole  40 mg Oral Daily  . rosuvastatin  10 mg Oral q1800  . sacubitril-valsartan  1 tablet Oral BID  . sodium chloride flush  3 mL Intravenous Q12H  . thiamine  100 mg Oral Daily   Or  . thiamine  100 mg Intravenous Daily   Continuous Infusions: . sodium chloride    . methocarbamol (ROBAXIN) IV      Objective: Vitals:   05/03/19 0509 05/03/19 1400 05/03/19 2140 05/04/19 0543  BP: (!) 144/82 103/81 (!) 143/85 138/84  Pulse: 96 93 83 80  Resp: '12 16 16 15  '$ Temp: 97.7 F (36.5 C) 98.1 F (36.7 C) 98.5 F (36.9 C) 98 F (36.7 C)  TempSrc: Oral Oral Oral Oral  SpO2: 94% 96% 97% 96%  Weight:      Height:        Intake/Output Summary (Last 24 hours) at 05/04/2019 1042 Last data filed at 05/04/2019 0849 Gross per 24 hour  Intake 483 ml  Output 550 ml  Net -67 ml   Filed Weights   04/28/19 1930 04/29/19 0123  Weight: 59 kg 59 kg   Weight change:   Body mass index is 20.36 kg/m.  Intake/Output from previous day: 02/13 0701 - 02/14 0700 In: 123 [P.O.:120; I.V.:3] Out: 300 [Urine:300] Intake/Output this shift: Total I/O In: 360 [P.O.:360] Out: 400 [Urine:400]  Examination: General exam: AAOx3, no tremors, NAD HEENT:Oral mucosa moist, Ear/Nose WNL grossly, dentition normal. Respiratory system: bilaterally clear, no wheezing or crackles,No use of accessory muscle Cardiovascular system: S1 & S2 +, No JVD,. Gastrointestinal system: Abdomen soft, NT,ND, BS+ Nervous System:Alert, awake, moving extremities and grossly nonfocal Extremities: No edema, distal peripheral pulses palpable.  Right hip surgical site with Aquacel dressing intact.  Nontender.  Skin: No rashes,no icterus. MSK: Normal muscle bulk,tone, power   Data Reviewed: I have personally reviewed following labs and imaging studies  CBC: Recent Labs  Lab 04/28/19 1934 04/28/19 1934  04/29/19 0150 04/30/19 0753 05/01/19 0438 05/02/19 0338 05/03/19 0335  WBC 4.9   < > 6.7 7.4 7.4 4.2 3.7*  NEUTROABS 2.3  --   --   --   --   --   --   HGB 15.2   < > 13.3 14.3 14.0 13.7 12.3*  HCT 46.4   < > 41.4 42.6 41.9 42.4 38.8*  MCV 108.7*   < > 111.6* 109.2* 109.1* 112.2* 112.5*  PLT 80*   < > 75* 62* 67* 60* 73*   < > = values in this interval not displayed.   Basic Metabolic Panel: Recent Labs  Lab 04/28/19 2056 04/29/19 0132 04/29/19 0150 05/01/19 0438 05/02/19 0338 05/03/19 0335  NA 142  --  141 135 135 137  K 4.0  --  4.7 4.1 3.3* 4.0  CL 105  --  109 103 107 108  CO2 27  --  24 23 21* 21*  GLUCOSE 90  --  92 118* 96 90  BUN 7  --  $'7 11 15 13  'j$ CREATININE 0.67  --  0.71 0.72 0.66 0.51*  CALCIUM 9.1  --  8.1* 8.3* 8.1* 8.3*  MG  --  2.0  --   --   --   --   PHOS  --  4.7*  --   --   --   --    GFR: Estimated Creatinine Clearance: 85 mL/min (A) (by C-G formula based on SCr of 0.51 mg/dL (L)). Liver Function Tests: Recent Labs  Lab 04/28/19 2056  AST 65*  ALT 31  ALKPHOS 99  BILITOT 0.6  PROT 7.8  ALBUMIN 3.9   No results for input(s): LIPASE, AMYLASE in the last 168 hours. No results for input(s): AMMONIA in the last 168 hours. Coagulation Profile: No results for input(s): INR, PROTIME in the last 168 hours. Cardiac Enzymes: No results for input(s): CKTOTAL, CKMB, CKMBINDEX, TROPONINI in the last 168 hours. BNP (last 3 results) No results for input(s): PROBNP in the last 8760 hours. HbA1C: No results for input(s): HGBA1C in the last 72 hours. CBG: No results for input(s): GLUCAP in the last 168 hours. Lipid Profile: No results for input(s): CHOL, HDL, LDLCALC, TRIG, CHOLHDL, LDLDIRECT in the last 72 hours. Thyroid Function Tests: No results for input(s): TSH, T4TOTAL, FREET4, T3FREE, THYROIDAB in the last 72 hours. Anemia Panel: No results for input(s): VITAMINB12, FOLATE, FERRITIN, TIBC, IRON, RETICCTPCT in the last 72 hours. Sepsis  Labs: No results for input(s): PROCALCITON, LATICACIDVEN in the last 168 hours.  Recent Results (from the past 240 hour(s))  SARS CORONAVIRUS 2 (TAT 6-24 HRS) Nasopharyngeal Nasopharyngeal Swab     Status: None   Collection Time: 04/28/19 10:57 PM   Specimen: Nasopharyngeal Swab  Result Value Ref Range Status   SARS Coronavirus 2 NEGATIVE NEGATIVE Final    Comment: (NOTE) SARS-CoV-2 target nucleic acids are NOT DETECTED. The SARS-CoV-2 RNA is generally detectable in upper and lower respiratory specimens during the acute phase of infection. Negative results do not preclude SARS-CoV-2 infection, do not rule out co-infections with other pathogens, and should not be used as the sole basis for treatment or other patient management decisions. Negative results must be combined with clinical observations, patient history, and epidemiological information. The expected result is Negative. Fact Sheet for Patients: SugarRoll.be Fact Sheet for Healthcare Providers: https://www.woods-mathews.com/ This test is not yet approved or cleared by the [place] FDA and  has been authorized for detection and/or diagnosis of SARS-CoV-2 by FDA under an Emergency Use Authorization (EUA). This EUA will remain  in effect (meaning this test can be used) for the duration of the COVID-19 declaration under Section 56 4(b)(1) of the Act, 21 U.S.C. section 360bbb-3(b)(1), unless the authorization is terminated or revoked sooner. Performed at [facility], [place] [address] [place], [place] [address]   Urine culture     Status: Abnormal   Collection Time: 04/29/19  8:00 AM   Specimen: Urine, Random  Result Value Ref Range Status   Specimen Description   Final    URINE, RANDOM Performed at [facility] [address] [place], [place] [address]    Special Requests   Final    NONE Performed at [facility], [place] [address] [place], [place] [address]    Culture (A)  Final    <10,000 COLONIES/mL INSIGNIFICANT GROWTH Performed at [place] [address] [place],  [address]    Report Status 04/30/2019 FINAL  Final  Surgical  pcr screen     Status: None   Collection Time: 04/29/19  9:26 PM   Specimen: Nasal Mucosa; Nasal Swab  Result Value Ref Range Status   MRSA, PCR NEGATIVE NEGATIVE Final   Staphylococcus aureus NEGATIVE NEGATIVE Final    Comment: (NOTE) The Xpert SA Assay (FDA approved for NASAL specimens in patients 95 years of age and older), is one component of a comprehensive surveillance program. It is not intended to diagnose infection nor to guide or monitor treatment. Performed at [facility], [place] [address] [place], [place] [address]       Radiology Studies: No results found.   LOS: 5 days   Time spent: More than 50% of that time was spent in counseling and/or coordination of care.  Antonieta Pert, MD [facility]  05/04/2019, 10:42 AM

## 2019-05-05 NOTE — Progress Notes (Signed)
Physical Therapy Treatment Patient Details Name: Trevor Williams MRN: [id number] DOB: Mar 02, 1961 Today's Date: 05/05/2019    History of Present Illness 59 year old M with history of alcohol abuse/DTs, chronic systolic CHF/an ICM, history of respiratory failure due to pneumonia and CHF, status post intubation admitted with right hip pain after he tripped over a rock and fell on the right hip.  Pt s/p Right femur ORIF 04/30/19    PT Comments    Progressing with mobility.    Follow Up Recommendations  SNF     Equipment Recommendations  Rolling walker with 5" wheels    Recommendations for Other Services       Precautions / Restrictions Precautions Precautions: Fall Restrictions Weight Bearing Restrictions: No RLE Weight Bearing: Weight bearing as tolerated    Mobility  Bed Mobility Overal bed mobility: Needs Assistance Bed Mobility: Supine to Sit;Sit to Supine     Supine to sit: Supervision;HOB elevated Sit to supine: Supervision;HOB elevated   General bed mobility comments: increased time. pt uses UE to assist R LE off/onto bed  Transfers Overall transfer level: Needs assistance Equipment used: Rolling walker (2 wheeled) Transfers: Sit to/from Stand Sit to Stand: Min guard         General transfer comment: VCs safety, hand placement. Increased time.  Ambulation/Gait Ambulation/Gait assistance: Min guard Gait Distance (Feet): 75 Feet Assistive device: Rolling walker (2 wheeled) Gait Pattern/deviations: Step-to pattern;Step-through pattern;Decreased stride length     General Gait Details: Step lengths improved as distance increased. Pt reports UE fatigue> LE fatigue so he is relying on walker quite a bit still. He tolerated increased distance fairly well   Stairs             Wheelchair Mobility    Modified Rankin (Stroke Patients Only)       Balance Overall balance assessment: Needs assistance;History of Falls         Standing balance support:  Bilateral upper extremity supported Standing balance-Leahy Scale: Fair                              Cognition Arousal/Alertness: Awake/alert Behavior During Therapy: WFL for tasks assessed/performed Overall Cognitive Status: Within Functional Limits for tasks assessed                                        Exercises      General Comments        Pertinent Vitals/Pain Pain Assessment: Faces Faces Pain Scale: Hurts even more Pain Location: R hip Pain Descriptors / Indicators: Discomfort;Sore;Aching Pain Intervention(s): Premedicated before session;Repositioned(pt declined ice)    Home Living                      Prior Function            PT Goals (current goals can now be found in the care plan section) Progress towards PT goals: Progressing toward goals    Frequency    Min 3X/week      PT Plan Current plan remains appropriate    Co-evaluation              AM-PAC PT "6 Clicks" Mobility   Outcome Measure  Help needed turning from your back to your side while in a flat bed without using bedrails?: A Little Help needed moving from lying on your back  to sitting on the side of a flat bed without using bedrails?: A Little Help needed moving to and from a bed to a chair (including a wheelchair)?: A Little Help needed standing up from a chair using your arms (e.g., wheelchair or bedside chair)?: A Little Help needed to walk in hospital room?: A Little Help needed climbing 3-5 steps with a railing? : A Little 6 Click Score: 18    End of Session Equipment Utilized During Treatment: Gait belt Activity Tolerance: Patient tolerated treatment well Patient left: in bed;with call bell/phone within reach;with bed alarm set   PT Visit Diagnosis: Difficulty in walking, not elsewhere classified (R26.2);Other abnormalities of gait and mobility (R26.89);Pain Pain - Right/Left: Right Pain - part of body: Leg     Time: [id number] PT  Time Calculation (min) (ACUTE ONLY): 12 min  Charges:  $Gait Training: 8-22 mins                         Doreatha Massed, PT Acute Rehabilitation

## 2019-05-05 NOTE — Progress Notes (Signed)
PROGRESS NOTE    Trevor Williams  ZWC:[id number] DOB: 1960/08/21 DOA: 04/28/2019 PCP: Elsie Stain, MD   Brief Narrative: 59 year old M with history of alcohol abuse/DTs, chronic systolic CHF/an ICM, history of respiratory failure due to pneumonia and CHF, status post intubation admitted with right hip pain after he tripped over a rock and fell on the right hip.In the ED CT head, neck no acute finding, CT abdomen pelvis showed intertrochanteric fracture right hip.  Blood work with alcohol level 330, platelet 80,000. Patient admitted for right hip intertrochanteric fracture.  Underwent ORIF 2/10. Patient doing well postoperatively.  Seen by PT OT and has advised skilled nursing facility. He has been deemed stable for discharge to skilled nursing facility once bed available.  Subjective: Seen this morning resting comfortably on the bed.  Denies any new complaints.  Assessment & Plan:   Right hip intertrochanteric fracture- 2/2 fall, s/p ORIF 2/10.  Surgically stable and cleared for discharge.  Continue PT OT pain control.  Continue  aspirin 81 twice daily for 4 weeks for DVT prophylaxis as per orthopedics.  Awaiting for skilled nursing facility..  Chronic congestive heart failure with systolic dysfunction/NICM: Euvolemic. Echo reviewed from 08/03/2025 LV moderately dilated with severe systolic dysfunction EF 15 to 25%, severely reduced RVEF.   Echo this admit with improved EF to 30%.  He has follow-up arranged to CHF clinic on 3/18 Cont home  Coreg 3.125, Crestor 10 mg, Entresto 25-26.      Alcohol abuse with alcohol intoxication with history of DTs, alcohol level 331 on admission.   Completed Librium taper.  No signs of withdrawals.  Continue thiamine folate.   Chronic thrombocytopenia: Suspect in the setting of chronic alcohol abuse.They were as low as 32k in 07/2018, Transfuse as needed.  Monitor intermittently. Recent Labs  Lab 04/29/19 0150 04/30/19 0753 05/01/19 0438 05/02/19 0338  05/03/19 0335  PLT 75* 62* 67* 60* 73*   Chronic bronchitis: No issues. Doing well on room air.  Tobacco abuse cont nicotine gum as needed and nicotine patch.  Social situation: Homeless. Living in shelter.  COVID 19 NEG No change in plan of care awaiting on placement.  Nutrition: Nutrition Problem: Increased nutrient needs Etiology: social / environmental circumstances, hip fracture(ETOH abuse) Signs/Symptoms: estimated needs Interventions: Ensure Enlive (each supplement provides 350kcal and 20 grams of protein), MVI, Magic cup  Body mass index is 20.36 kg/m.   DVT prophylaxis: SCD-aspirin 81 mg twice daily per Ortho. Code Status:FULL Family Communication: plan of care discussed with patient at bedside. Disposition Plan: Patient is from: Living in shelter/insured-without SNF benefit Anticipated d/c- SNF Barriers to d/c or conditions that needs to be met prior to d/c: Medically stable for discharge.  Awaiting for SNF placement.    Consultants: ortho, cardiology  Procedures: 04/30/19 Open reduction internal fixation of right intertrochanteric femur fracture utilizing a Biomet Affixus nail, 11 x 180 mm, with 130-degree lag screw  Microbiology:see note Antimicrobials: Anti-infectives (From admission, onward)   Start     Dose/Rate Route Frequency Ordered Stop   04/30/19 1830  ceFAZolin (ANCEF) IVPB 2g/100 mL premix     2 g 200 mL/hr over 30 Minutes Intravenous Every 6 hours 04/30/19 1520 05/01/19 0054   04/30/19 1130  ceFAZolin (ANCEF) IVPB 2g/100 mL premix     2 g 200 mL/hr over 30 Minutes Intravenous On call to O.R. 04/30/19 1125 04/30/19 1259      Medications: Scheduled Meds: . aspirin EC  81 mg Oral BID  . carvedilol  3.125 mg Oral BID WC  . chlordiazePOXIDE  25 mg Oral Q0600  . docusate sodium  100 mg Oral BID  . feeding supplement (ENSURE ENLIVE)  237 mL Oral TID BM  . ferrous sulfate  325 mg Oral TID PC  . folic acid  1 mg Oral Daily  . gabapentin  300 mg  Oral TID  . multivitamin with minerals  1 tablet Oral Daily  . nicotine  14 mg Transdermal Daily  . pantoprazole  40 mg Oral Daily  . rosuvastatin  10 mg Oral q1800  . sacubitril-valsartan  1 tablet Oral BID  . sodium chloride flush  3 mL Intravenous Q12H  . thiamine  100 mg Oral Daily   Or  . thiamine  100 mg Intravenous Daily   Continuous Infusions: . sodium chloride    . methocarbamol (ROBAXIN) IV      Objective: Vitals:   05/04/19 1338 05/04/19 2151 05/05/19 0506 05/05/19 1343  BP: 123/84 134/87 (!) 141/78 123/74  Pulse: 92 75 88 72  Resp: '16 16 16 16  '$ Temp:  98.6 F (37 C) 98 F (36.7 C) 97.6 F (36.4 C)  TempSrc:    Oral  SpO2: 100% 94% 93% 98%  Weight:      Height:        Intake/Output Summary (Last 24 hours) at 05/05/2019 1344 Last data filed at 05/05/2019 1255 Gross per 24 hour  Intake 660 ml  Output 300 ml  Net 360 ml   Filed Weights   04/28/19 1930 04/29/19 0123  Weight: 59 kg 59 kg   Weight change:   Body mass index is 20.36 kg/m.  Intake/Output from previous day: 02/14 0701 - 02/15 0700 In: 1140 [P.O.:1140] Out: 1300 [Urine:1300] Intake/Output this shift: Total I/O In: 480 [P.O.:480] Out: 200 [Urine:200]  Examination: General exam: AAOx3, no tremors, NAD HEENT:Oral mucosa moist, Ear/Nose WNL grossly, dentition normal. Respiratory system: bilaterally clear, no wheezing or crackles,No use of accessory muscle Cardiovascular system: S1 & S2 +, No JVD,. Gastrointestinal system: Abdomen soft, NT,ND, BS+ Nervous System:Alert, awake, moving extremities and grossly nonfocal Extremities: No edema, distal peripheral pulses palpable.  Surgical site in the right hip intact, no drainage Skin: No rashes,no icterus. MSK: Normal muscle bulk,tone, power   Data Reviewed: I have personally reviewed following labs and imaging studies  CBC: Recent Labs  Lab 04/28/19 1934 04/28/19 1934 04/29/19 0150 04/30/19 0753 05/01/19 0438 05/02/19 0338 05/03/19  0335  WBC 4.9   < > 6.7 7.4 7.4 4.2 3.7*  NEUTROABS 2.3  --   --   --   --   --   --   HGB 15.2   < > 13.3 14.3 14.0 13.7 12.3*  HCT 46.4   < > 41.4 42.6 41.9 42.4 38.8*  MCV 108.7*   < > 111.6* 109.2* 109.1* 112.2* 112.5*  PLT 80*   < > 75* 62* 67* 60* 73*   < > = values in this interval not displayed.   Basic Metabolic Panel: Recent Labs  Lab 04/28/19 2056 04/29/19 0132 04/29/19 0150 05/01/19 0438 05/02/19 0338 05/03/19 0335  NA 142  --  141 135 135 137  K 4.0  --  4.7 4.1 3.3* 4.0  CL 105  --  109 103 107 108  CO2 27  --  24 23 21* 21*  GLUCOSE 90  --  92 118* 96 90  BUN 7  --  '7 11 15 13  '$ CREATININE 0.67  --  0.71  0.72 0.66 0.51*  CALCIUM 9.1  --  8.1* 8.3* 8.1* 8.3*  MG  --  2.0  --   --   --   --   PHOS  --  4.7*  --   --   --   --    GFR: Estimated Creatinine Clearance: 84 mL/min (A) (by C-G formula based on SCr of 0.51 mg/dL (L)). Liver Function Tests: Recent Labs  Lab 04/28/19 2056  AST 65*  ALT 31  ALKPHOS 99  BILITOT 0.6  PROT 7.8  ALBUMIN 3.9   No results for input(s): LIPASE, AMYLASE in the last 168 hours. No results for input(s): AMMONIA in the last 168 hours. Coagulation Profile: No results for input(s): INR, PROTIME in the last 168 hours. Cardiac Enzymes: No results for input(s): CKTOTAL, CKMB, CKMBINDEX, TROPONINI in the last 168 hours. BNP (last 3 results) No results for input(s): PROBNP in the last 8760 hours. HbA1C: No results for input(s): HGBA1C in the last 72 hours. CBG: No results for input(s): GLUCAP in the last 168 hours. Lipid Profile: No results for input(s): CHOL, HDL, LDLCALC, TRIG, CHOLHDL, LDLDIRECT in the last 72 hours. Thyroid Function Tests: No results for input(s): TSH, T4TOTAL, FREET4, T3FREE, THYROIDAB in the last 72 hours. Anemia Panel: No results for input(s): VITAMINB12, FOLATE, FERRITIN, TIBC, IRON, RETICCTPCT in the last 72 hours. Sepsis Labs: No results for input(s): PROCALCITON, LATICACIDVEN in the last 168  hours.  Recent Results (from the past 240 hour(s))  SARS CORONAVIRUS 2 (TAT 6-24 HRS) Nasopharyngeal Nasopharyngeal Swab     Status: None   Collection Time: 04/28/19 10:57 PM   Specimen: Nasopharyngeal Swab  Result Value Ref Range Status   SARS Coronavirus 2 NEGATIVE NEGATIVE Final    Comment: (NOTE) SARS-CoV-2 target nucleic acids are NOT DETECTED. The SARS-CoV-2 RNA is generally detectable in upper and lower respiratory specimens during the acute phase of infection. Negative results do not preclude SARS-CoV-2 infection, do not rule out co-infections with other pathogens, and should not be used as the sole basis for treatment or other patient management decisions. Negative results must be combined with clinical observations, patient history, and epidemiological information. The expected result is Negative. Fact Sheet for Patients: SugarRoll.be Fact Sheet for Healthcare Providers: https://www.woods-mathews.com/ This test is not yet approved or cleared by the [place] FDA and  has been authorized for detection and/or diagnosis of SARS-CoV-2 by FDA under an Emergency Use Authorization (EUA). This EUA will remain  in effect (meaning this test can be used) for the duration of the COVID-19 declaration under Section 56 4(b)(1) of the Act, 21 U.S.C. section 360bbb-3(b)(1), unless the authorization is terminated or revoked sooner. Performed at [facility], [place] [address] [place], [place] [address]   Urine culture     Status: Abnormal   Collection Time: 04/29/19  8:00 AM   Specimen: Urine, Random  Result Value Ref Range Status   Specimen Description   Final    URINE, RANDOM Performed at [facility] [address], [place], [place] [address]    Special Requests   Final    NONE Performed at [facility], [place] [address], [place], [place] [address]    Culture (A)  Final    <10,000 COLONIES/mL  INSIGNIFICANT GROWTH Performed at [facility] [address] [place], [place] [address]    Report Status 04/30/2019 FINAL  Final  Surgical pcr screen     Status: None   Collection  Time: 04/29/19  9:26 PM   Specimen: Nasal Mucosa; Nasal Swab  Result Value Ref Range Status   MRSA, PCR NEGATIVE NEGATIVE Final   Staphylococcus aureus NEGATIVE NEGATIVE Final    Comment: (NOTE) The Xpert SA Assay (FDA approved for NASAL specimens in patients 32 years of age and older), is one component of a comprehensive surveillance program. It is not intended to diagnose infection nor to guide or monitor treatment. Performed at [facility], [place] [address] [place], [place] [address]       Radiology Studies: No results found.   LOS: 6 days   Time spent: More than 50% of that time was spent in counseling and/or coordination of care.  Antonieta Pert, MD [facility]  05/05/2019, 1:44 PM

## 2019-05-05 NOTE — TOC Progression Note (Signed)
Transition of Care (TOC) - Progression Note    Patient Details  Name: Trevor Williams MRN: [id number] Date of Birth: 07/24/1960  Transition of Care (TOC) CM/SW Contact  Nicole A Sinclair, LCSW Phone Number: 05/05/2019, 12:01 PM  Clinical Narrative:    CSW met with the patient at beside to provide an update-still working to find rehab placement.  Per PT, patient is progressing.  Patient gave CSW permission to  reach out to Director of [facility] Emergency [facility] Jaxsin Pearson to determine patient bed status and ability to return. CSW left a voicemail.    Expected Discharge Plan: Skilled Nursing Facility Barriers to Discharge: No SNF bed, Inadequate or no insurance, Homeless with medical needs  Expected Discharge Plan and Services Expected Discharge Plan: Skilled Nursing Facility In-house Referral: Clinical Social Work Discharge Planning Services: CM Consult Post Acute Care Choice: Skilled Nursing Facility Living arrangements for the past 2 months: Homeless Shelter                                       Social Determinants of Health (SDOH) Interventions    Readmission Risk Interventions Readmission Risk Prevention Plan 05/01/2019  Transportation Screening Complete  Medication Review (RN Care Manager) Complete  PCP or Specialist appointment within 3-5 days of discharge (No Data)  SW Recovery Care/Counseling Consult Complete  Palliative Care Screening Not Applicable  Skilled Nursing Facility Complete  Some recent data might be hidden   

## 2019-05-06 ENCOUNTER — Ambulatory Visit: Payer: Medicaid Other | Admitting: Critical Care Medicine

## 2019-05-06 LAB — SARS CORONAVIRUS 2 (TAT 6-24 HRS): SARS Coronavirus 2: NEGATIVE

## 2019-05-06 NOTE — Progress Notes (Signed)
PROGRESS NOTE    Trevor Williams  DDU:[id number] DOB: February 06, 1961 DOA: 04/28/2019 PCP: Elsie Stain, MD   Brief Narrative: 59 year old M with history of alcohol abuse/DTs, chronic systolic CHF/an ICM, history of respiratory failure due to pneumonia and CHF, status post intubation admitted with right hip pain after he tripped over a rock and fell on the right hip.In the ED CT head, neck no acute finding, CT abdomen pelvis showed intertrochanteric fracture right hip.  Blood work with alcohol level 330, platelet 80,000. Patient admitted for right hip intertrochanteric fracture.  Underwent ORIF 2/10. Patient doing well postoperatively.  Seen by PT OT and has advised skilled nursing facility. He has been deemed stable for discharge to skilled nursing facility once bed available.  Subjective:  No new complaints. Patient did well with ambulation yesterday physical therapy and PT continues to recommend skilled nursing facility with a rolling walker   Assessment & Plan:   Right hip intertrochanteric fracture- 2/2 fall, s/p ORIF 2/10.  Surgically stable and cleared for discharge.  Continue PT OT pain control.  Continue  aspirin 81 twice daily for 4 weeks for DVT prophylaxis as per orthopedics.  Awaiting for skilled nursing facility. He continues to do well physical therapy  Chronic congestive heart failure with systolic dysfunction/NICM: Euvolemic. Echo reviewed from 08/03/2025 LV moderately dilated with severe systolic dysfunction EF 15 to 25%, severely reduced RVEF.   Echo this admit with improved EF to 30%.  He has follow-up arranged to CHF clinic on 3/18 Cont home  Coreg 3.125, Crestor 10 mg, Entresto 25-26.      Alcohol abuse with alcohol intoxication with history of DTs, alcohol level 331 on admission.   Completed Librium taper.  No signs of withdrawals.  Continue thiamine folate.   Chronic thrombocytopenia: Suspect in the setting of chronic alcohol abuse.They were as low as 32k in 07/2018,  Transfuse as needed.  Monitor intermittently. Recent Labs  Lab 04/30/19 0753 05/01/19 0438 05/02/19 0338 05/03/19 0335  PLT 62* 67* 60* 73*   Chronic bronchitis: No issues. Doing well on room air.  Tobacco abuse cont nicotine gum as needed and nicotine patch.  Social situation: Homeless. Living in shelter.  COVID 19 NEG No change in plan of care awaiting on placement.  Nutrition: Nutrition Problem: Increased nutrient needs Etiology: social / environmental circumstances, hip fracture(ETOH abuse) Signs/Symptoms: estimated needs Interventions: Ensure Enlive (each supplement provides 350kcal and 20 grams of protein), MVI, Magic cup  Body mass index is 20.36 kg/m.   DVT prophylaxis: SCD-aspirin 81 mg twice daily per Ortho. Code Status:FULL Family Communication: plan of care discussed with patient at bedside. Disposition Plan: Patient is from: Living in shelter/insured-without SNF benefit Anticipated d/c- SNF Barriers to d/c or conditions that needs to be met prior to d/c: Medically stable for discharge.  Awaiting for SNF placement.    Consultants: ortho, cardiology  Procedures: 04/30/19 Open reduction internal fixation of right intertrochanteric femur fracture utilizing a Biomet Affixus nail, 11 x 180 mm, with 130-degree lag screw  Microbiology:see note Antimicrobials: Anti-infectives (From admission, onward)   Start     Dose/Rate Route Frequency Ordered Stop   04/30/19 1830  ceFAZolin (ANCEF) IVPB 2g/100 mL premix     2 g 200 mL/hr over 30 Minutes Intravenous Every 6 hours 04/30/19 1520 05/01/19 0054   04/30/19 1130  ceFAZolin (ANCEF) IVPB 2g/100 mL premix     2 g 200 mL/hr over 30 Minutes Intravenous On call to O.R. 04/30/19 1125 04/30/19 1259  Medications: Scheduled Meds: . aspirin EC  81 mg Oral BID  . carvedilol  3.125 mg Oral BID WC  . chlordiazePOXIDE  25 mg Oral Q0600  . docusate sodium  100 mg Oral BID  . feeding supplement (ENSURE ENLIVE)  237 mL  Oral TID BM  . ferrous sulfate  325 mg Oral TID PC  . folic acid  1 mg Oral Daily  . gabapentin  300 mg Oral TID  . multivitamin with minerals  1 tablet Oral Daily  . nicotine  14 mg Transdermal Daily  . pantoprazole  40 mg Oral Daily  . rosuvastatin  10 mg Oral q1800  . sacubitril-valsartan  1 tablet Oral BID  . sodium chloride flush  3 mL Intravenous Q12H  . thiamine  100 mg Oral Daily   Or  . thiamine  100 mg Intravenous Daily   Continuous Infusions: . sodium chloride    . methocarbamol (ROBAXIN) IV      Objective: Vitals:   05/05/19 0506 05/05/19 1343 05/05/19 2031 05/06/19 0530  BP: (!) 141/78 123/74 135/77 120/78  Pulse: 88 72 71 72  Resp: '16 16 18 18  '$ Temp: 98 F (36.7 C) 97.6 F (36.4 C) 97.8 F (36.6 C) 97.7 F (36.5 C)  TempSrc:  Oral Oral Oral  SpO2: 93% 98% 99% 93%  Weight:      Height:        Intake/Output Summary (Last 24 hours) at 05/06/2019 1035 Last data filed at 05/06/2019 1000 Gross per 24 hour  Intake 1380 ml  Output 1200 ml  Net 180 ml   Filed Weights   04/28/19 1930 04/29/19 0123  Weight: 59 kg 59 kg   Weight change:   Body mass index is 20.36 kg/m.  Intake/Output from previous day: 02/15 0701 - 02/16 0700 In: 1260 [P.O.:1260] Out: 1300 [Urine:1300] Intake/Output this shift: Total I/O In: 360 [P.O.:360] Out: -   Examination: General exam: Alert awake x3, no nausea vomiting.  HEENT:Oral mucosa moist, Ear/Nose WNL grossly, dentition normal. Respiratory system: bilaterally clear without wheezing or crackles,No use of accessory muscle Cardiovascular system: S1 & S2 +, No JVD,. Gastrointestinal system: Abdomen soft, NT,ND, BS+ Nervous System:Alert, awake, moving extremities and grossly nonfocal Extremities: No edema, distal peripheral pulses palpable.  Right hip surgical site with dressing intact, no drainage.  Right leg vasculature intact Skin: No rashes,no icterus. MSK: Normal muscle bulk,tone, power   Data Reviewed: I have  personally reviewed following labs and imaging studies  CBC: Recent Labs  Lab 04/30/19 0753 05/01/19 0438 05/02/19 0338 05/03/19 0335  WBC 7.4 7.4 4.2 3.7*  HGB 14.3 14.0 13.7 12.3*  HCT 42.6 41.9 42.4 38.8*  MCV 109.2* 109.1* 112.2* 112.5*  PLT 62* 67* 60* 73*   Basic Metabolic Panel: Recent Labs  Lab 05/01/19 0438 05/02/19 0338 05/03/19 0335  NA 135 135 137  K 4.1 3.3* 4.0  CL 103 107 108  CO2 23 21* 21*  GLUCOSE 118* 96 90  BUN '11 15 13  '$ CREATININE 0.72 0.66 0.51*  CALCIUM 8.3* 8.1* 8.3*   GFR: Estimated Creatinine Clearance: 84 mL/min (A) (by C-G formula based on SCr of 0.51 mg/dL (L)). Liver Function Tests: No results for input(s): AST, ALT, ALKPHOS, BILITOT, PROT, ALBUMIN in the last 168 hours. No results for input(s): LIPASE, AMYLASE in the last 168 hours. No results for input(s): AMMONIA in the last 168 hours. Coagulation Profile: No results for input(s): INR, PROTIME in the last 168 hours. Cardiac Enzymes: No results  for input(s): CKTOTAL, CKMB, CKMBINDEX, TROPONINI in the last 168 hours. BNP (last 3 results) No results for input(s): PROBNP in the last 8760 hours. HbA1C: No results for input(s): HGBA1C in the last 72 hours. CBG: No results for input(s): GLUCAP in the last 168 hours. Lipid Profile: No results for input(s): CHOL, HDL, LDLCALC, TRIG, CHOLHDL, LDLDIRECT in the last 72 hours. Thyroid Function Tests: No results for input(s): TSH, T4TOTAL, FREET4, T3FREE, THYROIDAB in the last 72 hours. Anemia Panel: No results for input(s): VITAMINB12, FOLATE, FERRITIN, TIBC, IRON, RETICCTPCT in the last 72 hours. Sepsis Labs: No results for input(s): PROCALCITON, LATICACIDVEN in the last 168 hours.  Recent Results (from the past 240 hour(s))  SARS CORONAVIRUS 2 (TAT 6-24 HRS) Nasopharyngeal Nasopharyngeal Swab     Status: None   Collection Time: 04/28/19 10:57 PM   Specimen: Nasopharyngeal Swab  Result Value Ref Range Status   SARS Coronavirus 2  NEGATIVE NEGATIVE Final    Comment: (NOTE) SARS-CoV-2 target nucleic acids are NOT DETECTED. The SARS-CoV-2 RNA is generally detectable in upper and lower respiratory specimens during the acute phase of infection. Negative results do not preclude SARS-CoV-2 infection, do not rule out co-infections with other pathogens, and should not be used as the sole basis for treatment or other patient management decisions. Negative results must be combined with clinical observations, patient history, and epidemiological information. The expected result is Negative. Fact Sheet for Patients: SugarRoll.be Fact Sheet for Healthcare Providers: https://www.woods-mathews.com/ This test is not yet approved or cleared by the [place] FDA and  has been authorized for detection and/or diagnosis of SARS-CoV-2 by FDA under an Emergency Use Authorization (EUA). This EUA will remain  in effect (meaning this test can be used) for the duration of the COVID-19 declaration under Section 56 4(b)(1) of the Act, 21 U.S.C. section 360bbb-3(b)(1), unless the authorization is terminated or revoked sooner. Performed at [facility], [place] [address], [place], [place] [address]   Urine culture     Status: Abnormal   Collection Time: 04/29/19  8:00 AM   Specimen: Urine, Random  Result Value Ref Range Status   Specimen Description   Final    URINE, RANDOM Performed at [facility] [address], [place], [place] [address]    Special Requests   Final    NONE Performed at [facility], [place] [address], [place], [place] [address]    Culture (A)  Final    <10,000 COLONIES/mL INSIGNIFICANT GROWTH Performed at [facility] [address], [place], [place] [address]    Report Status 04/30/2019 FINAL  Final  Surgical pcr screen     Status: None   Collection Time: 04/29/19  9:26 PM   Specimen: Nasal Mucosa; Nasal Swab  Result  Value Ref Range Status   MRSA, PCR NEGATIVE NEGATIVE Final   Staphylococcus aureus NEGATIVE NEGATIVE Final    Comment: (NOTE) The Xpert SA Assay (FDA approved for NASAL specimens in patients 55 years of age and older), is one component of a comprehensive surveillance program. It is not intended to diagnose infection nor to guide or monitor treatment. Performed at [facility], [place] [address], [place], [place] [address]       Radiology Studies: No results found.   LOS: 7 days   Time spent: More than 50% of that time was spent in counseling and/or coordination of care.  Antonieta Pert, MD [facility]  05/06/2019, 10:35 AM

## 2019-05-06 NOTE — Progress Notes (Signed)
Nutrition Follow-up  DOCUMENTATION CODES:   Not applicable  INTERVENTION:  - continue Ensure Enlive TID. - continue to encourage PO intakes. - weigh patient today.   NUTRITION DIAGNOSIS:   Increased nutrient needs related to social / environmental circumstances, hip fracture(ETOH abuse) as evidenced by estimated needs. -ongoing  GOAL:   Patient will meet greater than or equal to 90% of their needs -minimally met on average   MONITOR:   PO intake, Supplement acceptance, Labs, Weight trends, I & O's  ASSESSMENT:   59 y.o. male with medical history significant of alcohol abuse, history of DTs and delirium, congestive heart failure systolic dysfunction, nonischemic cardiomyopathy, history of respiratory failure secondary to pneumonia and CHF, status post intubation, came with a chief complaint of pain in the right hip.Patient states he was walking and tripped over a rock landing on his right hip.  He states that he also hit his head lost consciousness.  Patient has mainly been eating 50-100% of meals over the past 3 days. He has been accepting Ensure ~75% of the time offered. Patient has not been weighed since 2/9.   Per notes: - patient is medically stable for d/c and is pending SNF placement - POD #6 ORIF R intertrochanteric femur fx    Labs reviewed; creatinine: 0.51 mg/dl, Ca: 8.3 mg/dl. Medications reviewed; 100 mg colace BID, 325 mg ferrous sulfate TID, 1 mg folvite/day, daily multivitamin with minerals, 40 mg oral protonix/day, 100 mg thiamine/day.    Diet Order:   Diet Order            Diet regular Room service appropriate? Yes; Fluid consistency: Thin  Diet effective now              EDUCATION NEEDS:   No education needs have been identified at this time  Skin:  Skin Assessment: Skin Integrity Issues: Skin Integrity Issues:: Incisions Incisions: hip (2/10)  Last BM:  2/14  Height:   Ht Readings from Last 1 Encounters:  04/29/19 '5\' 7"'$  (1.702 m)     Weight:   Wt Readings from Last 1 Encounters:  04/29/19 59 kg    Ideal Body Weight:  61.3 kg  BMI:  Body mass index is 20.36 kg/m.  Estimated Nutritional Needs:   Kcal:  1800-2000  Protein:  85-95g  Fluid:  2L/day     [name], MS, RD, LDN, CNSC Inpatient Clinical Dietitian RD pager # available in [facility]  After hours/weekend pager # available in [facility]

## 2019-05-06 NOTE — TOC Progression Note (Signed)
Transition of Care [facility]) - Progression Note    Patient Details  Name: Trevor Williams MRN: [id number] Date of Birth: Oct 30, 1960  Transition of Care [facility]) CM/SW [place], [place] Phone Number: 05/06/2019, 12:30 PM  Clinical Narrative:    [facility] is agreeable to accept the patient for short rehab. Admissions Coordinator reports if the patient stays for 30 days at SNF then [facility] will cover his stay. CSW inform the admission coordinator the patient is not receiving disability funds. She reports the patient qualifies after screening his insurance benefits again.  CSW provided and update to the patient at bedside. Patient agreeable to stay 30 days and adhere to the facility smoking policy, scheduled times only. CSW assisted the patient in calling [facility] to inform them to put his belongings away until after rehab.  CSW notified the physician to order covid test, pending discharge tomorrow.    Expected Discharge Plan: [place] Barriers to Discharge: Barriers Resolved  Expected Discharge Plan and Services Expected Discharge Plan: [place] In-house Referral: Clinical Social Work Discharge Planning Services: CM Consult Post Acute Care Choice: [place] arrangements for the past 2 months: [place] Determinants of Health (SDOH) Interventions    Readmission Risk Interventions Readmission Risk Prevention Plan 05/01/2019  Transportation Screening Complete  Medication Review Press photographer) Complete  PCP or Specialist appointment within 3-5 days of discharge (No Data)  SW Recovery Care/Counseling Consult Complete  Palliative Care Screening Not Applicable  Skilled Nursing Facility Complete  Some recent data might be hidden

## 2019-05-07 NOTE — TOC Transition Note (Signed)
Transition of Care [facility]) - CM/SW Discharge Note   Patient Details  Name: Trevor Williams MRN: [id number] Date of Birth: 14-Mar-1961  Transition of Care [facility]) CM/SW Contact:  Lia Hopping, [place] Phone Number: 05/07/2019, 9:38 AM   Clinical Narrative:    [facility] -([phone] [facility] arranged for 12:00pm transport  Nurse call report to: [phone] "[address] Nurse"   Final next level of care: [place] Barriers to Discharge: Barriers Resolved   Patient Goals and CMS Choice Patient states their goals for this hospitalization and ongoing recovery are:: "Walk without hurting " CMS Medicare.gov Compare Post Acute Care list provided to:: Patient Choice offered to / list presented to : Patient  Discharge Placement              Patient chooses bed at: [facility] Patient to be transferred to facility by: PT[facility]   Patient and family notified of of transfer: 05/07/19  Discharge Plan and Services In-house Referral: Clinical Social Work Discharge Planning Services: CM Consult Post Acute Care Choice: [place]                               Social Determinants of Health (SDOH) Interventions     Readmission Risk Interventions Readmission Risk Prevention Plan 05/01/2019  Transportation Screening Complete  Medication Review Press photographer) Complete  PCP or Specialist appointment within 3-5 days of discharge (No Data)  SW Recovery Care/Counseling Consult Complete  Palliative Care Screening Not Applicable  Skilled Nursing Facility Complete  Some recent data might be hidden

## 2019-05-07 NOTE — Plan of Care (Signed)
Patient is discharged to SNF. Discharge instructions were given to patient as well as called to [facility]. [facility] transported patient.

## 2019-05-14 DIAGNOSIS — Z4789 Encounter for other orthopedic aftercare: Secondary | ICD-10-CM | POA: Diagnosis not present

## 2019-05-14 DIAGNOSIS — S72144A Nondisplaced intertrochanteric fracture of right femur, initial encounter for closed fracture: Secondary | ICD-10-CM | POA: Diagnosis not present

## 2019-05-15 DIAGNOSIS — E785 Hyperlipidemia, unspecified: Secondary | ICD-10-CM | POA: Diagnosis not present

## 2019-05-15 DIAGNOSIS — E039 Hypothyroidism, unspecified: Secondary | ICD-10-CM | POA: Diagnosis not present

## 2019-05-15 DIAGNOSIS — E7800 Pure hypercholesterolemia, unspecified: Secondary | ICD-10-CM | POA: Diagnosis not present

## 2019-05-15 DIAGNOSIS — E782 Mixed hyperlipidemia: Secondary | ICD-10-CM | POA: Diagnosis not present

## 2019-05-15 DIAGNOSIS — D518 Other vitamin B12 deficiency anemias: Secondary | ICD-10-CM | POA: Diagnosis not present

## 2019-05-15 DIAGNOSIS — Z79899 Other long term (current) drug therapy: Secondary | ICD-10-CM | POA: Diagnosis not present

## 2019-05-15 DIAGNOSIS — E559 Vitamin D deficiency, unspecified: Secondary | ICD-10-CM | POA: Diagnosis not present

## 2019-05-15 DIAGNOSIS — E119 Type 2 diabetes mellitus without complications: Secondary | ICD-10-CM | POA: Diagnosis not present

## 2019-05-15 DIAGNOSIS — D649 Anemia, unspecified: Secondary | ICD-10-CM | POA: Diagnosis not present

## 2019-06-05 ENCOUNTER — Encounter (HOSPITAL_COMMUNITY): Payer: Self-pay

## 2019-06-05 ENCOUNTER — Ambulatory Visit (HOSPITAL_COMMUNITY)
Admit: 2019-06-05 | Discharge: 2019-06-05 | Disposition: A | Discharge status: Home / Self Care | Payer: Medicaid Other | Source: Ambulatory Visit | Attending: Cardiology | Admitting: Cardiology

## 2019-06-05 ENCOUNTER — Other Ambulatory Visit: Payer: Self-pay

## 2019-06-05 VITALS — BP 116/74 | HR 79 | Wt 128.0 lb

## 2019-06-05 DIAGNOSIS — K7460 Unspecified cirrhosis of liver: Secondary | ICD-10-CM | POA: Insufficient documentation

## 2019-06-05 DIAGNOSIS — I428 Other cardiomyopathies: Secondary | ICD-10-CM | POA: Insufficient documentation

## 2019-06-05 DIAGNOSIS — Z79899 Other long term (current) drug therapy: Secondary | ICD-10-CM | POA: Diagnosis not present

## 2019-06-05 DIAGNOSIS — I5022 Chronic systolic (congestive) heart failure: Secondary | ICD-10-CM | POA: Diagnosis not present

## 2019-06-05 DIAGNOSIS — I5042 Chronic combined systolic (congestive) and diastolic (congestive) heart failure: Secondary | ICD-10-CM | POA: Diagnosis not present

## 2019-06-05 DIAGNOSIS — Z7982 Long term (current) use of aspirin: Secondary | ICD-10-CM | POA: Diagnosis not present

## 2019-06-05 DIAGNOSIS — I5082 Biventricular heart failure: Secondary | ICD-10-CM | POA: Diagnosis not present

## 2019-06-05 DIAGNOSIS — F1010 Alcohol abuse, uncomplicated: Secondary | ICD-10-CM | POA: Diagnosis not present

## 2019-06-05 DIAGNOSIS — I2510 Atherosclerotic heart disease of native coronary artery without angina pectoris: Secondary | ICD-10-CM | POA: Insufficient documentation

## 2019-06-05 DIAGNOSIS — J449 Chronic obstructive pulmonary disease, unspecified: Secondary | ICD-10-CM | POA: Insufficient documentation

## 2019-06-05 DIAGNOSIS — Z590 Homelessness: Secondary | ICD-10-CM | POA: Insufficient documentation

## 2019-06-05 DIAGNOSIS — Z7901 Long term (current) use of anticoagulants: Secondary | ICD-10-CM | POA: Insufficient documentation

## 2019-06-05 DIAGNOSIS — F17210 Nicotine dependence, cigarettes, uncomplicated: Secondary | ICD-10-CM | POA: Diagnosis not present

## 2019-06-05 LAB — BASIC METABOLIC PANEL
Anion gap: 9 (ref 5–15)
BUN: 14 mg/dL (ref 6–20)
CO2: 24 mmol/L (ref 22–32)
Calcium: 9.7 mg/dL (ref 8.9–10.3)
Chloride: 102 mmol/L (ref 98–111)
Creatinine, Ser: 0.75 mg/dL (ref 0.61–1.24)
GFR calc Af Amer: >60 mL/min (ref >60)
GFR calc non Af Amer: >60 mL/min (ref >60)
Glucose, Bld: 106 mg/dL — ABNORMAL HIGH (ref 70–99)
Potassium: 4.6 mmol/L (ref 3.5–5.1)
Sodium: 135 mmol/L (ref 135–145)

## 2019-06-05 MED ORDER — SPIRONOLACTONE 25 MG PO TABS: 12.5000 mg | ORAL_TABLET | Freq: Every day | ORAL | 3 refills | Status: DC

## 2019-06-05 NOTE — Progress Notes (Signed)
Heart and Vascular Care Navigation  06/05/2019  Trevor Williams 07-10-1960 [id number]  Reason for Referral:  Assessment:  HRT/VAS Care Coordination    Patients Home Cardiology Office  Heart Failure Clinic   Outpatient Care Team  Social Worker   Social Worker Name:  Tammy Sours [phone]   Living arrangements for the past 2 months  Homeless Shelter; [place]; No permanent address; [facility]   Lives with:  Self   Patient Current Insurance Coverage  Medicaid   Patient Has Concern With Paying Medical Bills  No   Does Patient Have Prescription Coverage?  Yes   Home Assistive Devices/Equipment  Cane (specify quad or straight)   DME Agency  NA   [place]  NA      Social History: SDOH Screenings   Alcohol Screen:   . Last Alcohol Screening Score (AUDIT):   Depression (PHQ2-9):   . PHQ-2 Score:   Financial Resource Strain: High Risk  . Difficulty of Paying Living Expenses: Very hard  Food Insecurity: Food Insecurity Present  . Worried About Charity fundraiser in the Last Year: Often true  . Ran Out of Food in the Last Year: Often true  Housing: High Risk  . Last Housing Risk Score: 2  Physical Activity:   . Days of Exercise per Week:   . Minutes of Exercise per Session:   Social Connections:   . Frequency of Communication with Friends and Family:   . Frequency of Social Gatherings with Friends and Family:   . Attends Religious Services:   . Active Member of Clubs or Organizations:   . Attends Archivist Meetings:   Marland Kitchen Marital Status:   Stress:   . Feeling of Stress :   Tobacco Use: High Risk  . Smoking Tobacco Use: Current Every Day Smoker  . Smokeless Tobacco Use: Current User  Transportation Needs: Unmet Transportation Needs  . Lack of Transportation (Medical): Yes  . Lack of Transportation (Non-Medical): Yes    SDOH Interventions: Financial Resources:  As patient was deemed disabled in order to receive Medicaid CSW will  try to see what happened to patient disability claim with hopes he will be eligible to receive SSI or SSDI.  Food Insecurity:   Pt currently at SNF so no interventions needed at this time as meals are provided at the facility.  Housing Insecurity:  Housing Interventions: Other (Comment)([place]-SP[place] assessment and referral to [facility] for rapid rehousing)  Transportation:    Currently receiving transport from SNF but also has Medicaid- pt aware that he can get transport through them to medical appts.    Barriers to Care: Homelessness, lack of transportation, lack of income, and no phone with which to communicate are the main barriers at this time.  Follow-up plan: Referral sent to [facility] to try and assist with rapid rehousing and message left with director at [facility] where pt was staying prior to admission to hospital to see what they had been working on with patient for housing and if he would be able to return to their shelter at discharge.  CSW will assist pt with purchasing a phone so we can communicate with him regarding medical follow up.  CSW has left message with admissions coordinator at [facility] to discuss pt insurance and if they can verify it pt is receiving disability benefits as that is usually a prerequisite of Medicaid paying for rehab services.    Will also plan on enrolling  pt in paramedicine when discharged from SNF to assist with medication compliance.  Patient To Do: no patient actions required at this time- pt to continue with rehab services  Care Navigation Team To Do: Follow-up with:[facility], [facility], and [facility] in  4 days
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
## 2019-06-05 NOTE — Progress Notes (Signed)
Advanced Heart Failure Clinic Note   Referring Physician: PCP: Elsie Stain, MD PCP-Cardiologist: Loralie Champagne, MD   [facility] F/u: S/p ORIF for Rt Hip Fx; Chronic Systolic Heart Failure   HPI:  Trevor Williams is a 59 year old homeless male with a history of COPD, self-reported liver cirrhosis, tobacco use, alcohol abuse and chronic systolic HF 2/2 NICM.   He was admitted 07/2018 for acute hypoxic respiratory failure and diagnosed w/ acute CHF and CAP. Treated w/ IV Lasix and abx. Echocardiogram showed reduced LVEF of 15-20% with diffuse hypokinesis and grade 2 diastolic dysfunction as well as severely reduced RV systolic function. LHC showed mild-moderate nonobstructive disease (60% ostial stenosis in moderate branch off ramus). RHC showed normal filling pressures and preserved CO. Cause of CM felt to be most likely 2/2 ETOH abuse. He was started on Entresto 24-26 bid + Coreg 3.125 mg bid.   He was admitted 2/21 for mechanical fall, in the setting of intoxication resulting in Rt Hip Fx. General cardiology was consulted for surgical clearance. Echo was repeated and showed slightly improved LVEF at 30%. Was stable from cardiac/HF standpoint and felt to be low risk. He was cleared for surgery and underwent successfully ORIF by ortho. Perioperative course uncomplicated. He was placed back on his HF meds and discharged to a SNF for rehab.   He presents to [facility] today for f/u. Still at SNF getting therapy. Progressing well. Hip is still sore and he is currently ambulating w/ a walker. No cardiac complaints. Denies resting and exertional dyspnea. No orthopnea/PND or LEE. Denies CP. I have personally reviewed records and MAR from [facility]. He has been getting HF meds as prescribed. VSS. BP today 116/74. He has abstained from ETOH since being at [facility]. He is unsure of his living arrangement once discharged from SNF. Prior to his surgery, he was living in and out of homeless shelters  and sometimes in the woods.   2D Echo 2/21  1. LV endocardial borders are poorly visualized and cannot rule out focal wall motion abonrmalities on this study. LVF appears at least mlidly reduced. Recommend definity contrast study to get accurate assessment of wall motion and EF. The left ventrical is not well visualized. The left ventricular internal cavity size was moderately dilated. Left ventricular diastolic parameters are consistent with Grade I diastolic dysfunction (impaired relaxation). 2. Right ventricular systolic function is normal. The right ventricular size is normal. There is normal pulmonary artery systolic pressure. 3. Right atrial size was mildly dilated. 4. The mitral valve is normal in structure and function. no evidence of mitral valve regurgitation. No evidence of mitral stenosis. 5. The aortic valve is tricuspid. Aortic valve regurgitation is not visualized. Mild aortic valve sclerosis is present, with no evidence of aortic valve stenosis. 6. The inferior vena cava is normal in size with <50% respiratory variability, suggesting right atrial pressure of 8 mmHg.  Limited 2D Echo 2/21 FINDINGS  Left Ventricle: Poor definity contrast study. LVF appears better in the  apical views than in the parasternal views. Overall EF is estimated at 30%  with global hypokinesis. Consider cardiac MRI if indicated to get accurate  EF. Definity contrast agent was  given IV to delineate the left ventricular endocardial borders.   LHC/RHC 5/20 : Coronary Findings   Diagnostic  Dominance: Right  Left Main  No significant disease.  Left Anterior Descending  Luminal irregularities.  Ramus Intermedius  Large branching vessel. There was 60% ostial stenosis in  the moderate-sized first branch off the main ramus.  Left Circumflex  Luminal irregularities.  Right Coronary Artery  Luminal irregularities.  Intervention   No interventions have been documented.  Right Heart    Right Heart Pressures RHC Procedural Findings: Hemodynamics (mmHg) RA mean 1 RV 16/1 PA 15/7 PCWP mean 2 LV 91/1 AO 94/60  Oxygen saturations: PA 64% AO 100%  Cardiac Output (Fick) 4.04  Cardiac Index (Fick) 2.43      Review of Systems: [y] = yes, [ ]  = no   General: Weight gain [ ] ; Weight loss [ ] ; Anorexia [ ] ; Fatigue [ ] ; Fever [ ] ; Chills [ ] ; Weakness [ ]   Cardiac: Chest pain/pressure [ ] ; Resting SOB [ ] ; Exertional SOB [ ] ; Orthopnea [ ] ; Pedal Edema [ ] ; Palpitations [ ] ; Syncope [ ] ; Presyncope [ ] ; Paroxysmal nocturnal dyspnea[ ]   Pulmonary: Cough [ ] ; Wheezing[ ] ; Hemoptysis[ ] ; Sputum [ ] ; Snoring [ ]   GI: Vomiting[ ] ; Dysphagia[ ] ; Melena[ ] ; Hematochezia [ ] ; Heartburn[ ] ; Abdominal pain [ ] ; Constipation [ ] ; Diarrhea [ ] ; BRBPR [ ]   GU: Hematuria[ ] ; Dysuria [ ] ; Nocturia[ ]   Vascular: Pain in legs with walking [ ] ; Pain in feet with lying flat [ ] ; Non-healing sores [ ] ; Stroke [ ] ; TIA [ ] ; Slurred speech [ ] ;  Neuro: Headaches[ ] ; Vertigo[ ] ; Seizures[ ] ; Paresthesias[ ] ;Blurred vision [ ] ; Diplopia [ ] ; Vision changes [ ]   Ortho/Skin: Arthritis [ ] ; Joint pain [ ] ; Muscle pain [ ] ; Joint swelling [ ] ; Back Pain [ ] ; Rash [ ]   Psych: Depression[ ] ; Anxiety[ ]   Heme: Bleeding problems [ ] ; Clotting disorders [ ] ; Anemia [ ]   Endocrine: Diabetes [ ] ; Thyroid dysfunction[ ]    Past Medical History:  Diagnosis Date  . Abnormal liver function tests   . Acute respiratory failure ([place])    a. 07/2018 requiring intubation - CAP/CHF.  Marland Kitchen Alcohol abuse   . Biventricular heart failure ([place])   . Cancer ([place])    skin (left hand)  . Delirium    a. h/o delirium while admitted  . Emphysema of lung ([place])   . Homelessness   . Macrocytic anemia   . Mild CAD    a. cath 07/2018 60% ostial ramus otherwise OK.  . Mitral regurgitation   . NICM (nonischemic cardiomyopathy) ([place])   . Protein calorie malnutrition ([place])   . Tricuspid regurgitation     Current  Outpatient Medications  Medication Sig Dispense Refill  . albuterol (VENTOLIN HFA) 108 (90 Base) MCG/ACT inhaler Inhale 2 puffs into the lungs every 4 (four) hours as needed for wheezing or shortness of breath. 18 g 0  . carvedilol (COREG) 3.125 MG tablet Take 1 tablet (3.125 mg total) by mouth 2 (two) times daily with a meal. 60 tablet 2  . folic acid (FOLVITE) 1 MG tablet Take 1 tablet (1 mg total) by mouth daily. 60 tablet 0  . gabapentin (NEURONTIN) 300 MG capsule Take 1 capsule (300 mg total) by mouth 3 (three) times daily. 90 capsule 3  . HYDROcodone-acetaminophen (NORCO/VICODIN) 5-325 MG tablet Take 1-2 tablets by mouth every 6 (six) hours as needed for moderate pain (pain score 4-6). 42 tablet 0  . methocarbamol (ROBAXIN) 500 MG tablet Take 1 tablet (500 mg total) by mouth every 6 (six) hours as needed for muscle spasms. 40 tablet 0  . pantoprazole (PROTONIX) 40 MG tablet Take 1 tablet (40 mg total) by mouth daily.  30 tablet 1  . rosuvastatin (CRESTOR) 10 MG tablet Take 1 tablet (10 mg total) by mouth daily at 6 PM. 30 tablet 3  . sacubitril-valsartan (ENTRESTO) 24-26 MG Take 1 tablet by mouth 2 (two) times daily. 60 tablet 0  . thiamine 100 MG tablet Take 1 tablet (100 mg total) by mouth daily. 30 tablet 3  . feeding supplement, ENSURE ENLIVE, (ENSURE ENLIVE) LIQD Take 237 mLs by mouth 3 (three) times daily between meals. (Patient not taking: Reported on 06/05/2019)    . ferrous sulfate 325 (65 FE) MG tablet Take 1 tablet (325 mg total) by mouth 3 (three) times daily after meals for 14 days. 42 tablet 0   No current facility-administered medications for this encounter.    Allergies  Allergen Reactions  . Doxycycline Rash    Rash noted after administration of vancomycin, doxycycline, and ceftriaxone. Unclear cause of rash.  . Ibuprofen Rash  . Rocephin [Ceftriaxone] Rash    Rash noted after administration of vancomycin, doxycycline, and ceftriaxone. Unclear cause of rash.  . Tylenol  [Acetaminophen] Rash  . Vancomycin Rash    Rash noted after administration of vancomycin, doxycycline, and ceftriaxone. Unclear cause of rash.      Social History   Socioeconomic History  . Marital status: Single    Spouse name: Not on file  . Number of children: Not on file  . Years of education: Not on file  . Highest education level: Not on file  Occupational History  . Not on file  Tobacco Use  . Smoking status: Current Every Day Smoker    Packs/day: 1.00    Types: Cigarettes  . Smokeless tobacco: Current User  Substance and Sexual Activity  . Alcohol use: Yes    Comment: Liquor daily - 1/2 of a fifth   . Drug use: No  . Sexual activity: Not on file  Other Topics Concern  . Not on file  Social History Narrative  . Not on file   Social Determinants of Health   Financial Resource Strain:   . Difficulty of Paying Living Expenses:   Food Insecurity:   . Worried About Charity fundraiser in the Last Year:   . Arboriculturist in the Last Year:   Transportation Needs:   . Film/video editor (Medical):   Marland Kitchen Lack of Transportation (Non-Medical):   Physical Activity:   . Days of Exercise per Week:   . Minutes of Exercise per Session:   Stress:   . Feeling of Stress :   Social Connections:   . Frequency of Communication with Friends and Family:   . Frequency of Social Gatherings with Friends and Family:   . Attends Religious Services:   . Active Member of Clubs or Organizations:   . Attends Archivist Meetings:   Marland Kitchen Marital Status:   Intimate Partner Violence:   . Fear of Current or Ex-Partner:   . Emotionally Abused:   Marland Kitchen Physically Abused:   . Sexually Abused:       Family History  Problem Relation Age of Onset  . Hypertension Maternal Grandfather     Vitals:   06/05/19 1053  BP: 116/74  Pulse: 79  SpO2: 98%  Weight: 58.1 kg     PHYSICAL EXAM: General:  Thin WF. Looks much older than actual age. No respiratory difficulty HEENT:  normal Neck: supple. no JVD. Carotids 2+ bilat; no bruits. No lymphadenopathy or thyromegaly appreciated. Cor: PMI nondisplaced. Regular rate & rhythm.  No rubs, gallops or murmurs. Lungs: clear Abdomen: soft, nontender, nondistended. No hepatosplenomegaly. No bruits or masses. Good bowel sounds. Extremities: no cyanosis, clubbing, rash, edema, trace bilateral LEE  Neuro: alert & oriented x 3, cranial nerves grossly intact. moves all 4 extremities w/o difficulty. Affect pleasant.  ECG: not performed    ASSESSMENT & PLAN:  1.Chronic Combined Systolic and Diastolic HF: Echo [id number] with EF 15-20% with moderate LV dilation, severely decreased RV systolic function. Nonischemic cardiomyopathy based on coronary angiography. RHC showed normal filling pressure and preserved CO/CI.Echo repeated 2/21. EF 30%. It is possible that his cardiomyopathy is due to heavy ETOH use.  - NYHA Class II. Euvolemic on exam today  - Continue Entresto 24/26 bid. BP too soft to titrate - Continue Coreg 3.125 mg bid. - Add Spiro 12.5 mg qd - Check BMP today and again in 7 days (at [facility]) - Will enroll in HF paramedicine program to help w/ meds/compliance once discharged from SNF - Needs to continue to abstain from ETOH once discharged from SNF.  - Will try to titrate HF meds gradually. Once on optimal medical therapy x 3 months, will need repeat Echo to reassess LVEF. If <35%, will need to refer to EP for potential ICD.  2. CAD:  Nonobstructive CAD on cath 5/20 (60% ostial stenosis in moderate branch off ramus).   - no s/s of ischemia  - Continue ASA 81 daily.  - Continue Crestor 10 mg daily. LDL 5/20 was controlled at 45 mg/dL. Unfortunately not fasting today. Can check FLP at next f/u visit. Had recent CMP 2/21. ALT ok.  3. COPD: no active wheezing on exam. - smoking cessation advised   4. ETOH abuse:  - Needs to continue to abstain from ETOH once discharged from SNF.  5. Rt Hip Fx: s/p ORIF 2/21. Continue PT at  SNF.  6. Homelessness: SW consulted. Pt seen by Tammy Sours today to discuss housing assistance and enrollment in HR paramedicine program. Appreciate her assistance.    F/u: F/u w/ PharmD in 2-3 weeks for further med titration. F/u w/ Dr. Aundra Dubin in 2-3 months.       Lyda Jester, PA-C 06/05/19

## 2019-06-05 NOTE — Patient Instructions (Signed)
START Spironolactone 12.5 mg, (one half tab) daily  Labs today We will only contact you if something comes back abnormal or we need to make some changes. Otherwise no news is good news!  Labs needed in 7-10 days will be done a facility  Your physician recommends that you schedule a follow-up appointment in: 2-3 weeks with Pharm D Your physician recommends that you schedule a follow-up appointment in: 3 months with Dr Aundra Dubin  Do the following things EVERYDAY: 1) Weigh yourself in the morning before breakfast. Write it down and keep it in a log. 2) Take your medicines as prescribed 3) Eat low salt foods--Limit salt (sodium) to 2000 mg per day.  4) Stay as active as you can everyday 5) Limit all fluids for the day to less than 2 liters  At the [facility], you and your health needs are our priority. As part of our continuing mission to provide you with exceptional heart care, we have created designated Provider Care Teams. These Care Teams include your primary Cardiologist (physician) and Advanced Practice Providers (APPs- Physician Assistants and Nurse Practitioners) who all work together to provide you with the care you need, when you need it.   You may see any of the following providers on your designated Care Team at your next follow up: Marland Kitchen Dr Glori Bickers . Dr Loralie Champagne . Darrick Grinder, NP . Lyda Jester, PA . Audry Riles, PharmD   Please be sure to bring in all your medications bottles to every appointment.

## 2019-06-09 ENCOUNTER — Telehealth (HOSPITAL_COMMUNITY): Payer: Self-pay | Admitting: Licensed Clinical Social Worker

## 2019-06-09 NOTE — Telephone Encounter (Signed)
CSW following up on several referrals for pt.  Confirmed with [facility] that they received [facility] referral and have placed patient on their case management list- will reach out to patient once his name comes up on the wait list- unable to provide estimate of how long this will take.  CSW called and left message for SNF social worker to discuss discharge plan and what they have been working on at this time so we can collaborate- Education officer, museum out today but should be back in tomorrow  CSW spoke with Arrie Senate with the [facility] who sent CSW referral form for pt to get help evaluating if he is eligible for services.  Referral forms completed with patients permission and sent in for review.  CSW will continue to follow and assist as needed  Jorge Ny, [facility] Desk#: [phone] Cell#: [phone]

## 2019-06-11 ENCOUNTER — Telehealth (HOSPITAL_COMMUNITY): Payer: Self-pay | Admitting: Licensed Clinical Social Worker

## 2019-06-11 NOTE — Telephone Encounter (Signed)
CSW obtain cellphone and 90day minute service plan for patient so he will have a way to communicate once he is discharged from SNF- updated chart with this number.  Took cellphone to SNF and SNF social worker to give to patient.   CSW then updated [facility] and [facility] regarding new contact number for patient.   CSW will continue to follow and assist as needed  Jorge Ny, [facility] Desk#: [phone] Cell#: [phone]

## 2019-06-12 DIAGNOSIS — I5022 Chronic systolic (congestive) heart failure: Secondary | ICD-10-CM | POA: Diagnosis not present

## 2019-06-13 DIAGNOSIS — M5136 Other intervertebral disc degeneration, lumbar region: Secondary | ICD-10-CM | POA: Diagnosis not present

## 2019-06-13 DIAGNOSIS — M545 Low back pain: Secondary | ICD-10-CM | POA: Diagnosis not present

## 2019-06-13 DIAGNOSIS — M533 Sacrococcygeal disorders, not elsewhere classified: Secondary | ICD-10-CM | POA: Diagnosis not present

## 2019-06-18 DIAGNOSIS — Z4789 Encounter for other orthopedic aftercare: Secondary | ICD-10-CM | POA: Diagnosis not present

## 2019-06-18 DIAGNOSIS — S72141D Displaced intertrochanteric fracture of right femur, subsequent encounter for closed fracture with routine healing: Secondary | ICD-10-CM | POA: Diagnosis not present

## 2019-06-20 ENCOUNTER — Telehealth (HOSPITAL_COMMUNITY): Payer: Self-pay | Admitting: Licensed Clinical Social Worker

## 2019-06-20 ENCOUNTER — Telehealth (HOSPITAL_COMMUNITY): Payer: Self-pay | Admitting: Pharmacist

## 2019-06-20 NOTE — Telephone Encounter (Signed)
CSW called pt social worker with [facility] to inquire about any updates on discharge planning- unable to reach- left message requesting return call  Jorge Ny, [facility] Desk#: [phone] Cell#: [phone]

## 2019-06-20 NOTE — Telephone Encounter (Signed)
Patient Advocate Encounter   Received notification from [facility] Medicaid that prior authorization for [place] is required.   PA submitted on [facility] Tracks Confirmation #: [id number] W Recipient ID: [id number] S Status is pending   Will continue to follow.  Audry Riles, PharmD, BCPS, BCCP, CPP Heart Failure Clinic Pharmacist [phone]

## 2019-06-23 NOTE — Telephone Encounter (Signed)
Advanced Heart Failure Patient Advocate Encounter  Prior Authorization for Delene Loll has been approved.    PA# [id number] Effective dates: 06/20/2019 - 06/19/2020  Patients co-pay is $3.00

## 2019-06-24 ENCOUNTER — Telehealth (HOSPITAL_COMMUNITY): Payer: Self-pay | Admitting: Licensed Clinical Social Worker

## 2019-06-24 ENCOUNTER — Telehealth: Payer: Self-pay | Admitting: Critical Care Medicine

## 2019-06-24 NOTE — Telephone Encounter (Signed)
CSW received call from [facility] SNF this morning that pt is officially discharging but needs a ride to [facility].  CSW able to set up ride to shelter through [employer].  Pt called CSW when he arrived to shelter and informed me that he was not given his medications when he left the SNF though SNF had confirmed they would provide him remaining meds at [facility] when I spoke with them yesterday.  CSW called SNF and they report that on his facesheet it states he has pending Medicaid so they couldn't dispense those medications to him- CSW informed him them that he has Medicaid and they should have been aware as that was the payor source for his SNF stay.  Was able to speak with DON who sent out an employee to shelter with his medications- confirmed with pt that he received them.   Pt also has CHW appt tomorrow morning but no transportation- was able to set up [facility] for him to get to appt tomorrow morning as well.  CSW will continue to follow and assist as needed  Jorge Ny, [facility] Desk#: [phone] Cell#: [phone]

## 2019-06-24 NOTE — Telephone Encounter (Signed)
   Carmi Osterkamp DOB: December 01, 1960 MRN: [id number]   RIDER WAIVER AND RELEASE OF LIABILITY  For purposes of improving physical access to our facilities, [facility] is pleased to partner with third parties to provide [facility] patients or other authorized individuals the option of convenient, on-demand ground transportation services (the [place]") through use of the technology service that enables users to request on-demand ground transportation from independent third-party providers.  By opting to use and accept these [employer], I, the undersigned, hereby agree on behalf of myself, and on behalf of any minor child using the [employer] for whom I am the parent or legal guardian, as follows:  1. Government social research officer provided to me are provided by independent third-party transportation providers who are not [employer] or employees and who are unaffiliated with [employer]. 2. [place] is neither a transportation carrier nor a common or public carrier. 3. [facility] has no control over the quality or safety of the transportation that occurs as a result of the [employer]. 4. [facility] cannot guarantee that any third-party transportation provider will complete any arranged transportation service. 5. [facility] makes no representation, warranty, or guarantee regarding the reliability, timeliness, quality, safety, suitability, or availability of any of the Transport Services or that they will be error free. 6. I fully understand that traveling by vehicle involves risks and dangers of serious bodily injury, including permanent disability, paralysis, and death. I agree, on behalf of myself and on behalf of any minor child using the Transport Services for whom I am the parent or legal guardian, that the entire risk arising out of my use of the [employer] remains solely with me, to the maximum extent permitted under applicable law. 7. The [employer] are provided "as is" and "as available." [facility] disclaims all representations and warranties, express, implied or statutory, not expressly set out in these terms, including the implied warranties of merchantability and fitness for a particular purpose. 8. I hereby waive and release [facility], its agents, employees, officers, directors, representatives, insurers, attorneys, assigns, successors, subsidiaries, and affiliates from any and all past, present, or future claims, demands, liabilities, actions, causes of action, or suits of any kind directly or indirectly arising from acceptance and use of the [employer]. 9. I further waive and release [place] and its affiliates from all present and future liability and responsibility for any injury or death to persons or damages to property caused by or related to the use of the [employer]. 10. I have read this Waiver and Release of Liability, and I understand the terms used in it and their legal significance. This Waiver is freely and voluntarily given with the understanding that my right (as well as the right of any minor child for whom I am the parent or legal guardian using the [employer]) to legal recourse against  in connection with the [employer] is knowingly surrendered in return for use of these services.   I attest that I read the consent document to Drema Balzarine, gave Mr. Rominger the opportunity to ask questions and answered the questions asked (if any). I affirm that Lional Nicklaus then provided consent for he's participation in this program.     Cameron Proud

## 2019-06-25 ENCOUNTER — Ambulatory Visit: Payer: Medicaid Other | Attending: Critical Care Medicine | Admitting: Critical Care Medicine

## 2019-06-25 ENCOUNTER — Encounter: Payer: Self-pay | Admitting: Critical Care Medicine

## 2019-06-25 ENCOUNTER — Telehealth (HOSPITAL_COMMUNITY): Payer: Self-pay | Admitting: Licensed Clinical Social Worker

## 2019-06-25 ENCOUNTER — Other Ambulatory Visit: Payer: Self-pay

## 2019-06-25 ENCOUNTER — Ambulatory Visit: Payer: Medicaid Other | Admitting: Licensed Clinical Social Worker

## 2019-06-25 VITALS — BP 93/61 | HR 92 | Temp 98.0°F | Ht 67.0 in | Wt 126.6 lb

## 2019-06-25 DIAGNOSIS — M5440 Lumbago with sciatica, unspecified side: Secondary | ICD-10-CM

## 2019-06-25 DIAGNOSIS — S72001S Fracture of unspecified part of neck of right femur, sequela: Secondary | ICD-10-CM

## 2019-06-25 DIAGNOSIS — E876 Hypokalemia: Secondary | ICD-10-CM

## 2019-06-25 DIAGNOSIS — Z5900 Homelessness unspecified: Secondary | ICD-10-CM

## 2019-06-25 DIAGNOSIS — K922 Gastrointestinal hemorrhage, unspecified: Secondary | ICD-10-CM | POA: Diagnosis not present

## 2019-06-25 DIAGNOSIS — Z7289 Other problems related to lifestyle: Secondary | ICD-10-CM

## 2019-06-25 DIAGNOSIS — F10920 Alcohol use, unspecified with intoxication, uncomplicated: Secondary | ICD-10-CM

## 2019-06-25 DIAGNOSIS — S72144S Nondisplaced intertrochanteric fracture of right femur, sequela: Secondary | ICD-10-CM

## 2019-06-25 DIAGNOSIS — M25562 Pain in left knee: Secondary | ICD-10-CM

## 2019-06-25 DIAGNOSIS — F17200 Nicotine dependence, unspecified, uncomplicated: Secondary | ICD-10-CM

## 2019-06-25 DIAGNOSIS — J410 Simple chronic bronchitis: Secondary | ICD-10-CM

## 2019-06-25 DIAGNOSIS — I5022 Chronic systolic (congestive) heart failure: Secondary | ICD-10-CM | POA: Diagnosis not present

## 2019-06-25 DIAGNOSIS — G8929 Other chronic pain: Secondary | ICD-10-CM

## 2019-06-25 DIAGNOSIS — D500 Iron deficiency anemia secondary to blood loss (chronic): Secondary | ICD-10-CM

## 2019-06-25 DIAGNOSIS — Z789 Other specified health status: Secondary | ICD-10-CM

## 2019-06-25 DIAGNOSIS — F1090 Alcohol use, unspecified, uncomplicated: Secondary | ICD-10-CM

## 2019-06-25 DIAGNOSIS — R7989 Other specified abnormal findings of blood chemistry: Secondary | ICD-10-CM

## 2019-06-25 MED ORDER — FOLIC ACID 1 MG PO TABS: 1.0000 mg | ORAL_TABLET | Freq: Every day | ORAL | 0 refills | Status: DC

## 2019-06-25 MED ORDER — PANTOPRAZOLE SODIUM 40 MG PO TBEC: 40.0000 mg | DELAYED_RELEASE_TABLET | Freq: Every day | ORAL | 1 refills | Status: DC

## 2019-06-25 MED ORDER — GABAPENTIN 300 MG PO CAPS: 300.0000 mg | ORAL_CAPSULE | Freq: Three times a day (TID) | ORAL | 3 refills | Status: DC

## 2019-06-25 MED ORDER — SACUBITRIL-VALSARTAN 24-26 MG PO TABS: 1.0000 | ORAL_TABLET | Freq: Two times a day (BID) | ORAL | 3 refills | Status: DC

## 2019-06-25 MED ORDER — MELOXICAM 7.5 MG PO TABS: 7.5000 mg | ORAL_TABLET | Freq: Every day | ORAL | 0 refills | Status: DC

## 2019-06-25 MED ORDER — ROSUVASTATIN CALCIUM 10 MG PO TABS: 10.0000 mg | ORAL_TABLET | Freq: Every day | ORAL | 3 refills | Status: DC

## 2019-06-25 MED ORDER — CARVEDILOL 3.125 MG PO TABS: 3.1250 mg | ORAL_TABLET | Freq: Two times a day (BID) | ORAL | 2 refills | Status: DC

## 2019-06-25 MED FILL — CARVEDILOL 3.125 MG TABLET: 3.125 | 30 days supply | Qty: 60 | Fill #0

## 2019-06-25 MED FILL — PANTOPRAZOLE SOD DR 40 MG T: 40 | 30 days supply | Qty: 30 | Fill #0

## 2019-06-25 MED FILL — ROSUVASTATIN CALCIUM 10 MG: 10 | 30 days supply | Qty: 30 | Fill #0

## 2019-06-25 MED FILL — MELOXICAM 7.5 MG TABLET: 7.5 | 30 days supply | Qty: 30 | Fill #0

## 2019-06-25 MED FILL — ENTRESTO 24 MG-26 MG TABLET: 24-26 | 30 days supply | Qty: 60 | Fill #0

## 2019-06-25 MED FILL — GABAPENTIN 300 MG CAPSULE: 300 | 30 days supply | Qty: 90 | Fill #0

## 2019-06-25 NOTE — Assessment & Plan Note (Signed)
The patient also is working with social services on housing but currently is staying at the homeless shelter

## 2019-06-25 NOTE — Patient Instructions (Signed)
No change in medications refills were all sent to our pharmacy  Begin meloxicam 1 daily for knee and hip pain and hand pain  Labs today to check your body chemistries liver function kidney function and blood counts  Clinical social worker is connecting with you regarding your alcohol use  Dr. Joya Gaskins to follow you up at the [place] clinic

## 2019-06-25 NOTE — Telephone Encounter (Signed)
Pt called CSW to update.  Reports he made it to his PCP appt this morning and they were able to refill all his meds.  Also reports that [facility] came by the [facility] today and got him to sign paperwork for his disability.  CSW will continue to follow and assist as needed  Jorge Ny, [facility] Desk#: [phone] Cell#: [phone]

## 2019-06-25 NOTE — Assessment & Plan Note (Signed)
Follow-up referral to orthopedics was made

## 2019-06-25 NOTE — Assessment & Plan Note (Signed)
No active bronchitis at this time patient is still actively smoking

## 2019-06-25 NOTE — Assessment & Plan Note (Signed)
Chronic recurrent alcohol intoxication currently not drinking alcohol  I had the patient connected with our licensed clinical social worker at this visit for alcohol counseling

## 2019-06-25 NOTE — BH Specialist Note (Signed)
Integrated Behavioral Health Initial Visit  MRN: [id number] Name: Trevor Williams  Number of [place] Clinician visits:: 1/6 Session Start time: 9:36am  Session End time: 9:55am Total time: 20  Type of Service: [place] Interpretor:No. Interpretor Name and Language: n/a   Warm Hand Off Completed.       SUBJECTIVE: Trevor Williams is a 59 y.o. male accompanied by self Patient was referred by Dr. Joya Gaskins for Alcohol Use Disorder Support. Patient reports the following symptoms/concerns: recent discharge from nursing facility to treat hip fracture due to falling while intoxicated Duration of problem: ongoing; Severity of problem: moderate  OBJECTIVE: Mood: Pleasant and Affect: Appropriate Risk of harm to self or others: No plan to harm self or others  LIFE CONTEXT: Family and Social: Patient is living at [employer].  School/Work: Patient panhandles for income.  Self-Care: Patient is insured through [place]. Patient is in contact with Tammy Sours, LCSW with [facility]. Patient has been referred to the [facility] [place] [facility] to assist with disability application. Life Changes: Patient was discharged yesterday from nursing facility.  GOALS ADDRESSED: Patient will: 1. Reduce symptoms of: stress 2. Increase knowledge and/or ability of: coping skills and healthy habits  3. Demonstrate ability to: Increase adequate support systems for patient/family, Increase motivation to adhere to plan of care and Decrease self-medicating behaviors  INTERVENTIONS: Interventions utilized: Motivational Interviewing  Standardized Assessments completed: GAD-7 and PHQ 2&9  ASSESSMENT: Patient is not currently interested in alcohol cessation. Patient states he can stop drinking if he wants to. Patient values were explored to encourage a reduction in alcohol use including health and independence. Patient  identified keeping busy as a coping skill to decrease alcohol use.   Patient may benefit from ongoing support from behavioral health clinicians to assist patient in resource needs and emotional support.   PLAN: 1. Follow up with behavioral health clinician on : Patient was encouraged to contact Christa See, LCSW if additional support is needed. 2. Behavioral recommendations: Patient was encouraged to follow up with [facility] and remain in contact with Tammy Sours, LCSW and Christa See, LCSW 3. Referral(s): [place] (In Clinic) 4. "From scale of 1-10, how likely are you to follow plan?":   Berniece Salines MSW Intern 06/25/19 4:42pm
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
## 2019-06-25 NOTE — Assessment & Plan Note (Signed)
Chronic systolic heart failure stable at this time  We will continue Coreg at 3.125 mg twice daily, Aldactone 12.5 mg daily, and Entresto twice daily and will provide patient assistance for the [facility]  The patient complained of gynecomastia and I explained to him this is a side effect from the Aldactone and for the reduced dose that should be improved

## 2019-06-25 NOTE — Assessment & Plan Note (Signed)
Hypokalemia and hypomagnesemia we will follow-up metabolic panel

## 2019-06-25 NOTE — Assessment & Plan Note (Signed)
We will follow-up liver function profile

## 2019-06-25 NOTE — Assessment & Plan Note (Signed)
Chronic low back pain and now status post right hip fracture and chronic right knee pain  Will refer back to orthopedics for follow-up

## 2019-06-25 NOTE — Assessment & Plan Note (Signed)
Closed hip fracture on the right now resolved status post open reduction internal fixation

## 2019-06-26 ENCOUNTER — Telehealth (HOSPITAL_COMMUNITY): Payer: Self-pay | Admitting: Licensed Clinical Social Worker

## 2019-06-26 LAB — CBC WITH DIFFERENTIAL/PLATELET
Basophils Absolute: 0.1 10*3/uL (ref 0.0–0.2)
Basos: 1 %
EOS (ABSOLUTE): 0.5 10*3/uL — ABNORMAL HIGH (ref 0.0–0.4)
Eos: 7 %
Hematocrit: 34.6 % — ABNORMAL LOW (ref 37.5–51.0)
Hemoglobin: 11.6 g/dL — ABNORMAL LOW (ref 13.0–17.7)
Immature Grans (Abs): 0 10*3/uL (ref 0.0–0.1)
Immature Granulocytes: 0 %
Lymphocytes Absolute: 2.7 10*3/uL (ref 0.7–3.1)
Lymphs: 35 %
MCH: 34.4 pg — ABNORMAL HIGH (ref 26.6–33.0)
MCHC: 33.5 g/dL (ref 31.5–35.7)
MCV: 103 fL — ABNORMAL HIGH (ref 79–97)
Monocytes Absolute: 0.8 10*3/uL (ref 0.1–0.9)
Monocytes: 10 %
Neutrophils Absolute: 3.7 10*3/uL (ref 1.4–7.0)
Neutrophils: 47 %
Platelets: 177 10*3/uL (ref 150–450)
RBC: 3.37 x10E6/uL — ABNORMAL LOW (ref 4.14–5.80)
RDW: 11.1 % — ABNORMAL LOW (ref 11.6–15.4)
WBC: 7.7 10*3/uL (ref 3.4–10.8)

## 2019-06-26 LAB — COMPREHENSIVE METABOLIC PANEL
ALT: 15 IU/L (ref 0–44)
AST: 21 IU/L (ref 0–40)
Albumin/Globulin Ratio: 1.5 (ref 1.2–2.2)
Albumin: 4.4 g/dL (ref 3.8–4.9)
Alkaline Phosphatase: 95 IU/L (ref 39–117)
BUN/Creatinine Ratio: 14 (ref 9–20)
BUN: 26 mg/dL — ABNORMAL HIGH (ref 6–24)
Bilirubin Total: 0.6 mg/dL (ref 0.0–1.2)
CO2: 22 mmol/L (ref 20–29)
Calcium: 10.1 mg/dL (ref 8.7–10.2)
Chloride: 100 mmol/L (ref 96–106)
Creatinine, Ser: 1.88 mg/dL — ABNORMAL HIGH (ref 0.76–1.27)
GFR calc Af Amer: 45 mL/min/{1.73_m2} — ABNORMAL LOW (ref >59)
GFR calc non Af Amer: 39 mL/min/{1.73_m2} — ABNORMAL LOW (ref >59)
Globulin, Total: 2.9 g/dL (ref 1.5–4.5)
Glucose: 76 mg/dL (ref 65–99)
Potassium: 5.2 mmol/L (ref 3.5–5.2)
Sodium: 138 mmol/L (ref 134–144)
Total Protein: 7.3 g/dL (ref 6.0–8.5)

## 2019-06-26 LAB — MAGNESIUM: Magnesium: 1.7 mg/dL (ref 1.6–2.3)

## 2019-06-26 NOTE — Telephone Encounter (Signed)
Entered in error

## 2019-06-26 NOTE — Telephone Encounter (Signed)
CSW called pt to remind of pharmacy appt on Monday- pt will need transportation- taxi request placed in pt appt notes.  Pt also confirms he has another appt with [facility] tomorrow to continue working on disability  CSW will continue to follow and assist as needed  Jorge Ny, [facility] Desk#: [phone] Cell#: [phone]

## 2019-06-30 ENCOUNTER — Inpatient Hospital Stay (HOSPITAL_COMMUNITY): Admission: RE | Admit: 2019-06-30 | Payer: Medicaid Other | Source: Ambulatory Visit

## 2019-07-02 ENCOUNTER — Ambulatory Visit (INDEPENDENT_AMBULATORY_CARE_PROVIDER_SITE_OTHER): Payer: Medicaid Other

## 2019-07-02 ENCOUNTER — Telehealth (HOSPITAL_COMMUNITY): Payer: Self-pay | Admitting: Licensed Clinical Social Worker

## 2019-07-02 ENCOUNTER — Other Ambulatory Visit: Payer: Self-pay

## 2019-07-02 ENCOUNTER — Encounter: Payer: Self-pay | Admitting: Orthopaedic Surgery

## 2019-07-02 ENCOUNTER — Ambulatory Visit (INDEPENDENT_AMBULATORY_CARE_PROVIDER_SITE_OTHER): Payer: Medicaid Other | Admitting: Orthopaedic Surgery

## 2019-07-02 DIAGNOSIS — M25551 Pain in right hip: Secondary | ICD-10-CM

## 2019-07-02 DIAGNOSIS — M25561 Pain in right knee: Secondary | ICD-10-CM

## 2019-07-02 DIAGNOSIS — G8929 Other chronic pain: Secondary | ICD-10-CM | POA: Diagnosis not present

## 2019-07-02 MED ORDER — LIDOCAINE HCL 1 % IJ SOLN
2.0000 mL | INTRAMUSCULAR | Status: AC | PRN
  Administered 2019-07-02: 2 mL

## 2019-07-02 MED ORDER — BUPIVACAINE HCL 0.5 % IJ SOLN
2.0000 mL | INTRAMUSCULAR | Status: AC | PRN
  Administered 2019-07-02: 2 mL via INTRA_ARTICULAR

## 2019-07-02 MED ORDER — METHYLPREDNISOLONE ACETATE 40 MG/ML IJ SUSP
40.0000 mg | INTRAMUSCULAR | Status: AC | PRN
  Administered 2019-07-02: 40 mg via INTRA_ARTICULAR

## 2019-07-02 NOTE — Progress Notes (Signed)
Office Visit Note   Patient: Trevor Williams           Date of Birth: 1960/05/03           MRN: [id number] Visit Date: 07/02/2019              Requested by: Trevor Stain, MD [address],  [place] [address] PCP: Trevor Stain, MD   Assessment & Plan: Visit Diagnoses:  1. Pain in right hip   2. Chronic pain of right knee     Plan: Impression is status post surgical fixation right intertrochanteric fracture by Dr. Alvan Williams and right knee osteoarthritis.  In terms of the right hip pain I think this is likely still due to postsurgical healing and changes.  Recommend symptomatic treatment from that standpoint.  For the right knee cortisone injection provided today.  Patient did wish to be referred to a chronic pain management clinic.  Follow-up as needed.  Follow-Up Instructions: Return if symptoms worsen or fail to improve.   Orders:  Orders Placed This Encounter  Procedures  . XR HIP UNILAT W OR W/O PELVIS 2-3 VIEWS RIGHT  . XR KNEE 3 VIEW RIGHT  . Ambulatory referral to Pain Clinic   No orders of the defined types were placed in this encounter.     Procedures: Large Joint Inj: R knee on 07/02/2019 5:59 PM Indications: pain Details: 22 G needle  Arthrogram: No  Medications: 40 mg methylPREDNISolone acetate 40 MG/ML; 2 mL lidocaine 1 %; 2 mL bupivacaine 0.5 % Consent was given by the patient. Patient was prepped and draped in the usual sterile fashion.       Clinical Data: No additional findings.   Subjective: Chief Complaint  Patient presents with  . Right Hip - Pain  . Right Knee - Pain    Trevor Williams is a homeless gentleman who comes in for evaluation of chronic right hip and right knee pain.  He underwent surgical repair on 04/30/2019 by Dr. Alvan Williams for a nondisplaced intertrochanteric fracture.  Since then he has been released from Dr. Alvan Williams for the hip fracture.  He states that he has chronic pain for 20 years to the right hip and knee.  Denies any  radicular symptoms.  He denies any swelling.  Denies any constitutional symptoms.  He states that Norco meloxicam do not provide any significant relief.   Review of Systems  Constitutional: Negative.   All other systems reviewed and are negative.    Objective: Vital Signs: There were no vitals taken for this visit.  Physical Exam Vitals and nursing note reviewed.  Constitutional:      Appearance: He is well-developed.  HENT:     Head: Normocephalic and atraumatic.  Eyes:     Pupils: Pupils are equal, round, and reactive to light.  Pulmonary:     Effort: Pulmonary effort is normal.  Abdominal:     Palpations: Abdomen is soft.  Musculoskeletal:        General: Normal range of motion.     Cervical back: Neck supple.  Skin:    General: Skin is warm.  Neurological:     Mental Status: He is alert and oriented to person, place, and time.  Psychiatric:        Behavior: Behavior normal.        Thought Content: Thought content normal.        Judgment: Judgment normal.     Ortho Exam Right hip shows fully healed surgical  scars.  He has decent range of motion without significant pain.  No evidence of infection.  Thigh is soft.  Right knee shows no joint effusion.  Normal range of motion with mild discomfort.  No patellofemoral crepitus.  Collaterals and cruciates are stable. Specialty Comments:  No specialty comments available.  Imaging: XR HIP UNILAT W OR W/O PELVIS 2-3 VIEWS RIGHT  Result Date: 07/02/2019 Status post cephalomedullary fixation without complication.  Mildly displaced greater trochanter fragment.  XR KNEE 3 VIEW RIGHT  Result Date: 07/02/2019 Mild osteoarthritis.  No acute abnormalities.    PMFS History: Patient Active Problem List   Diagnosis Date Noted  . Closed nondisplaced intertrochanteric fracture of right femur ([place])   . Alcoholic intoxication without complication ([place]) XX123456  . Chronic systolic heart failure ([place]) 03/03/2019  . Elevated  LFTs 03/03/2019  . Left lateral knee pain 03/03/2019  . Iron deficiency anemia due to chronic blood loss 03/03/2019  . Hypomagnesemia 03/03/2019  . Alcohol use 01/26/2019  . Homeless 01/26/2019  . Hypokalemia 01/26/2019  . Simple chronic bronchitis ([place]) 01/25/2015  . Tobacco use disorder 01/25/2015  . Chronic midline low back pain with sciatica 12/25/2014   Past Medical History:  Diagnosis Date  . Abnormal liver function tests   . Acute respiratory failure ([place])    a. 07/2018 requiring intubation - CAP/CHF.  Marland Kitchen Alcohol abuse   . Biventricular heart failure ([place])   . Cancer ([place])    skin (left hand)  . Delirium    a. h/o delirium while admitted  . Emphysema of lung ([place])   . Homelessness   . Macrocytic anemia   . Mild CAD    a. cath 07/2018 60% ostial ramus otherwise OK.  . Mitral regurgitation   . NICM (nonischemic cardiomyopathy) ([place])   . Protein calorie malnutrition ([place])   . Tricuspid regurgitation     Family History  Problem Relation Age of Onset  . Hypertension Maternal Grandfather     Past Surgical History:  Procedure Laterality Date  . BIOPSY  01/28/2019   Procedure: BIOPSY;  Surgeon: Otis Brace, MD;  [facility];  Service: Gastroenterology;;  . BIOPSY  01/30/2019   Procedure: BIOPSY;  Surgeon: Otis Brace, MD;  [facility];  Service: Gastroenterology;;  . COLONOSCOPY WITH PROPOFOL N/A 01/30/2019   Procedure: COLONOSCOPY WITH PROPOFOL;  Surgeon: Otis Brace, MD;  [facility];  Service: Gastroenterology;  Laterality: N/A;  . ESOPHAGOGASTRODUODENOSCOPY (EGD) WITH PROPOFOL N/A 01/28/2019   Procedure: ESOPHAGOGASTRODUODENOSCOPY (EGD) WITH PROPOFOL;  Surgeon: Otis Brace, MD;  [facility];  Service: Gastroenterology;  Laterality: N/A;  . FEMUR IM NAIL Right 04/30/2019   Procedure: INTRAMEDULLARY (IM) NAIL FEMORAL;  Surgeon: Paralee Cancel, MD;  [facility];  Service: Orthopedics;  Laterality:  Right;  . HEMOSTASIS CLIP PLACEMENT  01/30/2019   Procedure: HEMOSTASIS CLIP PLACEMENT;  Surgeon: Otis Brace, MD;  [facility];  Service: Gastroenterology;;  . POLYPECTOMY  01/30/2019   Procedure: POLYPECTOMY;  Surgeon: Otis Brace, MD;  [facility];  Service: Gastroenterology;;  . RIGHT/LEFT HEART CATH AND CORONARY ANGIOGRAPHY N/A 08/09/2018   Procedure: RIGHT/LEFT HEART CATH AND CORONARY ANGIOGRAPHY;  Surgeon: Larey Dresser, MD;  Location: [facility];  Service: Cardiovascular;  Laterality: N/A;   Social History   Occupational History  . Not on file  Tobacco Use  . Smoking status: Current Every Day Smoker    Packs/day: 1.00    Types: Cigarettes  . Smokeless tobacco: Current User  Substance and Sexual Activity  . Alcohol use: Yes    Comment: Liquor daily - 1/2 of a fifth   . Drug use: No  . Sexual activity: Not on file

## 2019-07-02 NOTE — Telephone Encounter (Signed)
Pt called and requested help going to follow up orthopedic appt this afternoon- CSW able to set up through [place] will continue to follow and assist as needed  Jorge Ny, [facility] Desk#: [phone] Cell#: [phone]

## 2019-07-03 ENCOUNTER — Telehealth: Payer: Self-pay | Admitting: Orthopaedic Surgery

## 2019-07-03 ENCOUNTER — Telehealth (HOSPITAL_COMMUNITY): Payer: Self-pay | Admitting: Licensed Clinical Social Worker

## 2019-07-03 NOTE — Telephone Encounter (Signed)
CSW received call from pt to discuss his orthopedic appt yesterday.  Per patient his hip pain was not addressed during visit but CSW able to see in notes that referral to Chronic Pain Management center was discussed with pt and he apparently refused.  Pt remembers discussing a Pain clinic but reports he is interested in attending if that is the best way to get pain management.  CSW called pt orthopedic office and informed that pt is interested in Chronic Pain Management clinic and requested they place referral  CSW also spoke with pt about rescheduling Advanced Heart Failure pharmacy appt that he missed on Monday- able to reschedule for 4/29- CSW will assist with setting up transport to this appt.  CSW will continue to follow and assist as needed  Jorge Ny, [facility] Desk#: [phone] Cell#: [phone]

## 2019-07-03 NOTE — Telephone Encounter (Signed)
Called Trevor Williams, advised her we did put in a referral to pain clinic. She is aware.

## 2019-07-03 NOTE — Telephone Encounter (Signed)
Jenna from [facility] called requesting that Dr. Erlinda Hong send to referral for pain management. Mrs. Trevor Williams stated patient agreed to pain management. Patient was confused on what pain management was. Jenna phone number is [phone]. Please give Trevor Williams a call back with any questions or concerns

## 2019-07-04 NOTE — Progress Notes (Signed)
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    He was admitted 07/2018 for acute hypoxic respiratory failure and diagnosed w/ acute CHF and CAP. Treated w/ IV Lasix and abx. Echocardiogram showed reduced LVEF of 15-20% with diffuse hypokinesis and grade 2 diastolic dysfunction as well as severely reduced RV systolic function. LHC showed mild-moderate nonobstructive disease (60% ostial stenosis in moderate branch off ramus). RHC showed normal filling pressures and preserved CO. Cause of CM felt to be most likely 2/2 ETOH abuse. He was started on Entresto 24-26 BID + Coreg 3.125 mg BID.   He was admitted 2/21 for mechanical fall, in the setting of intoxication resulting in Rt Hip Fx. General cardiology was consulted for surgical clearance. Echo was repeated and showed slightly improved LVEF at 30%. Was stable from cardiac/HF standpoint and felt to be low risk. He was cleared for surgery and underwent successfully ORIF by ortho. Perioperative course uncomplicated. He was placed back on his HF meds and discharged to a SNF for rehab.    Today he returns to HF clinic for pharmacist medication titration. At last visit with PA on 06/05/19, spironolactone 12.5 mg daily was added. Today he complained of breast tenderness after starting the spironolactone so he stopped the medication. He stated that he stopped all medications other than his pain medications a few weeks ago due to this side effect with the spironolactone. He was seen for a first visit with paramedicine Nira Conn) today and she will start helping him with his medications. He was tearful in clinic today because one of his friends recently passed away and he is dealing with that grief. Does not wish to talk about it at this time. Has left SNF and is now at [facility].  No complaints of dizziness, lightheadedness, chest pain (other than breast tenderness) or palpitations. No scale at the shelter, but his weight is stable at 125 lbs in clinic today. No LEE, PND or orthopnea. Does complain of R knee pain. Will be seen by Chronic Pain Management.   HF Medications: Carvedilol 3.125 mg BID Entresto 24/26 mg BID Spironolactone 12.5 mg daily   Has the patient been experiencing any side effects to the medications prescribed?  yes - gynecomastia with spironolactone (added to allergies).    Does the patient have any problems obtaining medications due to transportation or finances?   Yes, homelessness - has [place] Medicaid prescription insurance. Medications filled at [facility].   Understanding of regimen: poor Understanding of indications: poor Potential of compliance: poor - will now by followed by paramedicine program Nira Conn) Patient understands to avoid NSAIDs. Patient understands to avoid decongestants.     Pertinent Lab Values:   Serum creatinine 0.74, BUN 5, Potassium 3.5, Sodium 140, BNP 54.3   Vital Signs:  Weight: 125 lbs (last clinic weight: 128 lbs)  Blood pressure: 134/80   Heart rate: 100    Assessment: 1.Chronic Combined Systolic and Diastolic HF: Echo XX123456 with EF 15-20% with moderate LV dilation, severely decreased RV systolic function.  Nonischemic cardiomyopathy based on coronary angiography. RHC showed normal filling pressure and preserved CO/CI. Echo repeated 2/21. EF 30%. It is possible that his cardiomyopathy is due to heavy ETOH use.   - NYHA Class II. Euvolemic on exam today  - As noted above, he stopped  all his HF medications in early April because he started having gynecomastia with spironolactone.   - Vitals: BP 134/80, HR 100 - Labs: Scr improved from 1.88 to 0.74. K improved from 5.2 to 3.5 since he self-discontinued spironolactone. BNP decreased from 163.2 pg/mL to 54.3 pg/mL. - Restart carvedilol 3.125 mg BID - Restart  Entresto 24/26 mg BID. Repeat BMET in 3 weeks.  - Will not restart spironolactone due to complaints of gynecomastia. Could potentially tolerate eplerenone in the future. Added spironolactone to list of allergies.  - Needs to continue to abstain from ETOH - Will try to titrate HF meds gradually. Once on optimal medical therapy x 3 months, will need repeat Echo to reassess LVEF. If <35%, will need to refer to EP for potential ICD.    2. CAD:  Nonobstructive CAD on cath 5/20 (60% ostial stenosis in moderate branch off ramus).   - no s/s of ischemia  - Continue ASA 81 daily.  - Continue Crestor 10 mg daily. LDL 5/20 was controlled at 45 mg/dL.    3. COPD: no active wheezing on exam. - smoking cessation advised    4. ETOH abuse:  - Needs to continue to abstain from ETOH   5. Rt Hip Fx: s/p ORIF 2/21.     6. Homelessness: SW consulted.  -Pt now enrolled with paramedicine  - Currently living at [facility].     Plan: 1) Medication changes: Based on clinical presentation, vital signs and recent labs will restart carvedilol 3.125 mg BID and Entresto 24/26 mg BID.  2) Labs: Scr 0.74, K 3.5, BNP 54.3 3) Follow-up: 3 weeks with Pharmacy Clinic.   Audry Riles, PharmD, BCPS, BCCP, CPP Heart Failure Clinic Pharmacist [phone]

## 2019-07-07 ENCOUNTER — Telehealth (HOSPITAL_COMMUNITY): Payer: Self-pay | Admitting: Licensed Clinical Social Worker

## 2019-07-07 NOTE — Telephone Encounter (Signed)
Pt called CSW to request transport to new [facility] on Friday.  CSW able to set up through [employer].  Spoke with pt about using Medicaid transportation for future appts with other offices- pt expressed understanding and reports he has the number for Medicaid transport and will attempt to do this next time.  CSW will continue to follow and assist as needed  Jorge Ny, [facility] Desk#: [phone] Cell#: [phone]

## 2019-07-09 ENCOUNTER — Telehealth (HOSPITAL_COMMUNITY): Payer: Self-pay

## 2019-07-09 ENCOUNTER — Emergency Department (HOSPITAL_COMMUNITY)
Admission: EM | Admit: 2019-07-09 | Discharge: 2019-07-09 | Disposition: A | Discharge status: Home / Self Care | Payer: Medicaid Other | Attending: Emergency Medicine | Admitting: Emergency Medicine

## 2019-07-09 ENCOUNTER — Encounter (HOSPITAL_COMMUNITY): Payer: Self-pay | Admitting: Emergency Medicine

## 2019-07-09 DIAGNOSIS — F17210 Nicotine dependence, cigarettes, uncomplicated: Secondary | ICD-10-CM | POA: Diagnosis not present

## 2019-07-09 DIAGNOSIS — F10920 Alcohol use, unspecified with intoxication, uncomplicated: Secondary | ICD-10-CM | POA: Insufficient documentation

## 2019-07-09 DIAGNOSIS — Z590 Homelessness: Secondary | ICD-10-CM | POA: Diagnosis not present

## 2019-07-09 DIAGNOSIS — Z79899 Other long term (current) drug therapy: Secondary | ICD-10-CM | POA: Diagnosis not present

## 2019-07-09 DIAGNOSIS — I2510 Atherosclerotic heart disease of native coronary artery without angina pectoris: Secondary | ICD-10-CM | POA: Diagnosis not present

## 2019-07-09 DIAGNOSIS — M25561 Pain in right knee: Secondary | ICD-10-CM | POA: Insufficient documentation

## 2019-07-09 MED ORDER — ACETAMINOPHEN 325 MG PO TABS
650.0000 mg | ORAL_TABLET | Freq: Once | ORAL | Status: AC
  Administered 2019-07-09: 650 mg via ORAL
  Filled 2019-07-09: qty 2

## 2019-07-09 NOTE — ED Triage Notes (Signed)
Per [facility] pt from street in front of homeless shelter for intoxication and right leg pain. Been out of his medications for week.

## 2019-07-09 NOTE — ED Notes (Signed)
Pt wheeled to the lobby and pt got up from wheelchair with his belongings and cane and walked out of the front door with no assistance.

## 2019-07-09 NOTE — ED Provider Notes (Signed)
[facility] DEPT Provider Note   CSN: [id number] Arrival date & time: 07/09/19  1648     History Chief Complaint  Patient presents with  . Leg Pain  . Alcohol Intoxication    Trevor Williams is a 59 y.o. male.  The history is provided by the patient.  Leg Pain Location:  Knee Injury: no   Knee location:  R knee Pain details:    Quality:  Aching   Radiates to:  Does not radiate   Severity:  Mild   Onset quality:  Gradual   Timing:  Intermittent Relieved by:  Nothing Worsened by:  Activity Associated symptoms: no back pain, no decreased ROM, no fatigue, no fever, no itching, no muscle weakness, no neck pain, no numbness, no stiffness, no swelling and no tingling   Alcohol Intoxication       Past Medical History:  Diagnosis Date  . Abnormal liver function tests   . Acute respiratory failure ([place])    a. 07/2018 requiring intubation - CAP/CHF.  Marland Kitchen Alcohol abuse   . Biventricular heart failure ([place])   . Cancer ([place])    skin (left hand)  . Delirium    a. h/o delirium while admitted  . Emphysema of lung ([place])   . Homelessness   . Macrocytic anemia   . Mild CAD    a. cath 07/2018 60% ostial ramus otherwise OK.  . Mitral regurgitation   . NICM (nonischemic cardiomyopathy) ([place])   . Protein calorie malnutrition ([place])   . Tricuspid regurgitation     Patient Active Problem List   Diagnosis Date Noted  . Closed nondisplaced intertrochanteric fracture of right femur ([place])   . Alcoholic intoxication without complication ([place]) XX123456  . Chronic systolic heart failure ([place]) 03/03/2019  . Elevated LFTs 03/03/2019  . Left lateral knee pain 03/03/2019  . Iron deficiency anemia due to chronic blood loss 03/03/2019  . Hypomagnesemia 03/03/2019  . Alcohol use 01/26/2019  . Homeless 01/26/2019  . Hypokalemia 01/26/2019  . Simple chronic bronchitis ([place]) 01/25/2015  . Tobacco use disorder 01/25/2015  . Chronic midline low back pain with  sciatica 12/25/2014    Past Surgical History:  Procedure Laterality Date  . BIOPSY  01/28/2019   Procedure: BIOPSY;  Surgeon: Otis Brace, MD;  [facility];  Service: Gastroenterology;;  . BIOPSY  01/30/2019   Procedure: BIOPSY;  Surgeon: Otis Brace, MD;  [facility];  Service: Gastroenterology;;  . COLONOSCOPY WITH PROPOFOL N/A 01/30/2019   Procedure: COLONOSCOPY WITH PROPOFOL;  Surgeon: Otis Brace, MD;  [facility];  Service: Gastroenterology;  Laterality: N/A;  . ESOPHAGOGASTRODUODENOSCOPY (EGD) WITH PROPOFOL N/A 01/28/2019   Procedure: ESOPHAGOGASTRODUODENOSCOPY (EGD) WITH PROPOFOL;  Surgeon: Otis Brace, MD;  [facility];  Service: Gastroenterology;  Laterality: N/A;  . FEMUR IM NAIL Right 04/30/2019   Procedure: INTRAMEDULLARY (IM) NAIL FEMORAL;  Surgeon: Paralee Cancel, MD;  [facility];  Service: Orthopedics;  Laterality: Right;  . HEMOSTASIS CLIP PLACEMENT  01/30/2019   Procedure: HEMOSTASIS CLIP PLACEMENT;  Surgeon: Otis Brace, MD;  [facility];  Service: Gastroenterology;;  . POLYPECTOMY  01/30/2019   Procedure: POLYPECTOMY;  Surgeon: Otis Brace, MD;  [facility];  Service: Gastroenterology;;  . RIGHT/LEFT HEART CATH AND CORONARY ANGIOGRAPHY N/A 08/09/2018   Procedure: RIGHT/LEFT HEART CATH AND CORONARY ANGIOGRAPHY;  Surgeon: Larey Dresser, MD;  Location: [facility];  Service: Cardiovascular;  Laterality: N/A;       Family History  Problem  Relation Age of Onset  . Hypertension Maternal Grandfather     Social History   Tobacco Use  . Smoking status: Current Every Day Smoker    Packs/day: 1.00    Types: Cigarettes  . Smokeless tobacco: Current User  Substance Use Topics  . Alcohol use: Yes    Comment: Liquor daily - 1/2 of a fifth   . Drug use: No    Home Medications Prior to Admission medications   Medication Sig Start Date End Date Taking?  Authorizing Provider  albuterol (VENTOLIN HFA) 108 (90 Base) MCG/ACT inhaler Inhale 2 puffs into the lungs every 4 (four) hours as needed for wheezing or shortness of breath. 03/20/19   Elsie Stain, MD  carvedilol (COREG) 3.125 MG tablet Take 1 tablet (3.125 mg total) by mouth 2 (two) times daily with a meal. 06/25/19   Elsie Stain, MD  folic acid (FOLVITE) 1 MG tablet Take 1 tablet (1 mg total) by mouth daily. 06/25/19   Elsie Stain, MD  gabapentin (NEURONTIN) 300 MG capsule Take 1 capsule (300 mg total) by mouth 3 (three) times daily. 06/25/19   Elsie Stain, MD  HYDROcodone-acetaminophen (NORCO/VICODIN) 5-325 MG tablet Take 1-2 tablets by mouth every 6 (six) hours as needed for moderate pain (pain score 4-6). 05/01/19   Maurice March, PA-C  meloxicam (MOBIC) 7.5 MG tablet Take 1 tablet (7.5 mg total) by mouth daily. 06/25/19   Elsie Stain, MD  pantoprazole (PROTONIX) 40 MG tablet Take 1 tablet (40 mg total) by mouth daily. 06/25/19 07/25/19  Elsie Stain, MD  rosuvastatin (CRESTOR) 10 MG tablet Take 1 tablet (10 mg total) by mouth daily at 6 PM. 06/25/19   Elsie Stain, MD  sacubitril-valsartan (ENTRESTO) 24-26 MG Take 1 tablet by mouth 2 (two) times daily. 06/25/19   Elsie Stain, MD  spironolactone (ALDACTONE) 25 MG tablet Take 0.5 tablets (12.5 mg total) by mouth daily. 06/05/19 09/03/19  Lyda Jester M, PA-C  thiamine 100 MG tablet Take 1 tablet (100 mg total) by mouth daily. 03/20/19   Elsie Stain, MD    Allergies    Doxycycline, Ibuprofen, Rocephin [ceftriaxone], Tylenol [acetaminophen], and Vancomycin  Review of Systems   Review of Systems  Constitutional: Negative for fatigue and fever.  Musculoskeletal: Positive for arthralgias and gait problem. Negative for back pain, joint swelling, myalgias, neck pain, neck stiffness and stiffness.  Skin: Negative for color change, itching, pallor, rash and wound.  Neurological: Negative for weakness,  light-headedness and numbness.    Physical Exam Updated Vital Signs BP 96/80   Pulse (!) 104   Temp 98.4 F (36.9 C)   Resp 17   Ht 5' 6.5" (1.689 m)   SpO2 99%   BMI 20.13 kg/m   Physical Exam Constitutional:      General: He is not in acute distress.    Appearance: He is not ill-appearing.  Cardiovascular:     Pulses: Normal pulses.  Musculoskeletal:        General: Tenderness present. No swelling or deformity. Normal range of motion.     Cervical back: Normal range of motion and neck supple. No tenderness.     Right lower leg: No edema.     Left lower leg: No edema.  Skin:    General: Skin is warm.     Findings: No bruising, erythema, lesion or rash.  Neurological:     General: No focal deficit present.     Mental  Status: He is alert.     Sensory: No sensory deficit.     Motor: No weakness.     ED Results / Procedures / Treatments   Labs (all labs ordered are listed, but only abnormal results are displayed) Labs Reviewed - No data to display  EKG None  Radiology No results found.  Procedures Procedures (including critical care time)  Medications Ordered in ED Medications  acetaminophen (TYLENOL) tablet 650 mg (has no administration in time range)    ED Course  I have reviewed the triage vital signs and the nursing notes.  Pertinent labs & imaging results that were available during my care of the patient were reviewed by me and considered in my medical decision making (see chart for details).    MDM Rules/Calculators/A&P                      Trevor Williams is a 59 year old male who presents to the ED with right knee pain.  Patient with unremarkable vitals.  No fever.  Chronic right knee pain.  States he had a steroid injection several weeks ago for the same.  No new trauma or fall.  No swelling.  Some tenderness around the right knee but no obvious deformity.  Patient has not taken any medication for this.  Will give Tylenol.  Overall likely arthritic  chronic pain.  Patient mostly here for social reasons as he is homeless.  Clinically he is sober as there was concern that he was intoxicated with EMS.  Patient has good pulses in his legs.  Doubt any arterial process.  Doubt DVT.  Discharged in ED in good condition.  Recommend follow-up with primary care doctor.  This chart was dictated using voice recognition software.  Despite best efforts to proofread,  errors can occur which can change the documentation meaning.   Final Clinical Impression(s) / ED Diagnoses Final diagnoses:  Acute pain of right knee    Rx / DC Orders ED Discharge Orders    None       Lennice Sites, DO 07/09/19 1836

## 2019-07-09 NOTE — ED Notes (Signed)
Pt requesting an ambulance to take him back to the homeless shelter. Pt reports that he does ambulate around the homeless shelter. Advised pt that EMS will not transport him to the shelter if he is able to walk. Pt reports "so I just need to fall and then they'll come and get me". Charge RN made aware and pt will be wheeled into the lobby in a wheelchair. Phone in lobby shown to pt so he can call a ride if needed.

## 2019-07-09 NOTE — Telephone Encounter (Signed)
Spoke to Trevor Williams who reports he is currently in the waiting room at [facility] to be seen for leg and hip pain. He reports he is going back to [facility]. I asked how and he stated he is going to get sent back to the assisted living facility. I advised Sylvio to contact me once he is discharged. I will make a follow up call tomorrow as well. Patient agreed. Call complete.

## 2019-07-11 ENCOUNTER — Ambulatory Visit: Payer: Medicaid Other | Admitting: Physical Medicine and Rehabilitation

## 2019-07-17 ENCOUNTER — Ambulatory Visit (HOSPITAL_COMMUNITY)
Admission: RE | Admit: 2019-07-17 | Discharge: 2019-07-17 | Disposition: A | Discharge status: Home / Self Care | Payer: Medicaid Other | Source: Ambulatory Visit | Attending: Cardiology | Admitting: Cardiology

## 2019-07-17 ENCOUNTER — Other Ambulatory Visit (HOSPITAL_COMMUNITY): Payer: Self-pay

## 2019-07-17 ENCOUNTER — Other Ambulatory Visit: Payer: Self-pay

## 2019-07-17 VITALS — BP 134/80 | HR 100 | Wt 125.0 lb

## 2019-07-17 DIAGNOSIS — F1010 Alcohol abuse, uncomplicated: Secondary | ICD-10-CM | POA: Diagnosis not present

## 2019-07-17 DIAGNOSIS — I5022 Chronic systolic (congestive) heart failure: Secondary | ICD-10-CM

## 2019-07-17 DIAGNOSIS — F17210 Nicotine dependence, cigarettes, uncomplicated: Secondary | ICD-10-CM | POA: Diagnosis not present

## 2019-07-17 DIAGNOSIS — Z590 Homelessness: Secondary | ICD-10-CM | POA: Diagnosis not present

## 2019-07-17 DIAGNOSIS — I428 Other cardiomyopathies: Secondary | ICD-10-CM | POA: Diagnosis not present

## 2019-07-17 DIAGNOSIS — M25561 Pain in right knee: Secondary | ICD-10-CM | POA: Diagnosis not present

## 2019-07-17 DIAGNOSIS — I5042 Chronic combined systolic (congestive) and diastolic (congestive) heart failure: Secondary | ICD-10-CM | POA: Insufficient documentation

## 2019-07-17 DIAGNOSIS — I2510 Atherosclerotic heart disease of native coronary artery without angina pectoris: Secondary | ICD-10-CM | POA: Insufficient documentation

## 2019-07-17 DIAGNOSIS — Z7982 Long term (current) use of aspirin: Secondary | ICD-10-CM | POA: Diagnosis not present

## 2019-07-17 DIAGNOSIS — Z7901 Long term (current) use of anticoagulants: Secondary | ICD-10-CM | POA: Insufficient documentation

## 2019-07-17 DIAGNOSIS — J449 Chronic obstructive pulmonary disease, unspecified: Secondary | ICD-10-CM | POA: Insufficient documentation

## 2019-07-17 DIAGNOSIS — Z79899 Other long term (current) drug therapy: Secondary | ICD-10-CM | POA: Insufficient documentation

## 2019-07-17 LAB — BASIC METABOLIC PANEL
Anion gap: 13 (ref 5–15)
BUN: 5 mg/dL — ABNORMAL LOW (ref 6–20)
CO2: 26 mmol/L (ref 22–32)
Calcium: 8.7 mg/dL — ABNORMAL LOW (ref 8.9–10.3)
Chloride: 101 mmol/L (ref 98–111)
Creatinine, Ser: 0.74 mg/dL (ref 0.61–1.24)
GFR calc Af Amer: >60 mL/min (ref >60)
GFR calc non Af Amer: >60 mL/min (ref >60)
Glucose, Bld: 81 mg/dL (ref 70–99)
Potassium: 3.5 mmol/L (ref 3.5–5.1)
Sodium: 140 mmol/L (ref 135–145)

## 2019-07-17 LAB — BRAIN NATRIURETIC PEPTIDE: B Natriuretic Peptide: 54.3 pg/mL (ref 0.0–100.0)

## 2019-07-17 NOTE — Progress Notes (Signed)
Met with Trevor Williams in the pharmacy clinic today where he was alert and oriented but seemed down and sad today and he reported having some depression after his close friend just recently passed away. Donivan stated he had stopped taking all of his medications due to this. Today medications were reviewed, verified and confirmed. Pill box was filled accordingly.   Gabapentin placed in morning and evening due to shortage.   Entresto samples received from pharmacy.   Pill box filled and patient agreed to visit in one week. I will continue to follow.   SPIRONOLACTONE STOPPED.  Refills: Gabapentin Meloxicam   Weight- 125lbs BP- 134/80 HR- 100  LABS NORMAL NO MED CHANGE  -per Audry Riles at 1200.

## 2019-07-17 NOTE — Patient Instructions (Signed)
It was a pleasure seeing you today!  MEDICATIONS: -No medication changes today -Please restart the carvedilol and Entresto for your heart failure. -Call if you have questions about your medications.  LABS: -We will call you if your labs need attention.  NEXT APPOINTMENT: Return to clinic in 3 weeks with Pharmacy Clinic.  In general, to take care of your heart failure: -Limit your fluid intake to 2 Liters (half-gallon) per day.   -Limit your salt intake to ideally 2-3 grams (2000-3000 mg) per day. -Weigh yourself daily and record, and bring that "weight diary" to your next appointment.  (Weight gain of 2-3 pounds in 1 day typically means fluid weight.) -The medications for your heart are to help your heart and help you live longer.   -Please contact us before stopping any of your heart medications.  Call the clinic at [phone] with questions or to reschedule future appointments.

## 2019-07-17 NOTE — Progress Notes (Addendum)
CSW assisted in getting pt a taxi to make it to todays appt.  During appt CSW took pts phone to verify how many minutes he had and saw he only had about 28 minutes left on his phone.  CSW able to purchase another 300 minutes online to be added to his plan.  Pt became tearful during appt due to recent loss of his long time friend- has really been struggling with that loss on top of his continuing concerns with chronic pain.  CSW provided support regarding recent loss of friend but pt is not interested in talking with anyone further about it now- understands he can reach out if he changes his mind.  CSW will continue to follow pt and assist as needed  Jorge Ny, [facility] Desk#: [phone] Cell#: [phone]

## 2019-07-17 NOTE — Addendum Note (Signed)
Encounter addended by: Jorge Ny, LCSW on: 07/17/2019 2:12 PM  Actions taken: Clinical Note Signed

## 2019-07-24 ENCOUNTER — Other Ambulatory Visit: Payer: Self-pay | Admitting: Critical Care Medicine

## 2019-07-24 ENCOUNTER — Other Ambulatory Visit (HOSPITAL_COMMUNITY): Payer: Self-pay

## 2019-07-24 MED FILL — GABAPENTIN 300 MG CAPSULE: 300 | 30 days supply | Qty: 90 | Fill #1

## 2019-07-24 NOTE — Progress Notes (Signed)
Paramedicine Encounter    Patient ID: Trevor Williams, male    DOB: 05/19/1960, 59 y.o.   MRN: [id number]   Patient Care Team: Elsie Stain, MD as PCP - General (Pulmonary Disease) Larey Dresser, MD as PCP - Advanced Heart Failure (Cardiology) Larey Dresser, MD as PCP - Cardiology (Cardiology)  Patient Active Problem List   Diagnosis Date Noted  . Closed nondisplaced intertrochanteric fracture of right femur ([place])   . Alcoholic intoxication without complication ([place]) [id number]  . Chronic systolic heart failure ([place]) 03/03/2019  . Elevated LFTs 03/03/2019  . Left lateral knee pain 03/03/2019  . Iron deficiency anemia due to chronic blood loss 03/03/2019  . Hypomagnesemia 03/03/2019  . Alcohol use 01/26/2019  . Homeless 01/26/2019  . Hypokalemia 01/26/2019  . Simple chronic bronchitis ([place]) 01/25/2015  . Tobacco use disorder 01/25/2015  . Chronic midline low back pain with sciatica 12/25/2014    Current Outpatient Medications:  .  albuterol (VENTOLIN HFA) 108 (90 Base) MCG/ACT inhaler, Inhale 2 puffs into the lungs every 4 (four) hours as needed for wheezing or shortness of breath., Disp: 18 g, Rfl: 0 .  carvedilol (COREG) 3.125 MG tablet, Take 1 tablet (3.125 mg total) by mouth 2 (two) times daily with a meal., Disp: 60 tablet, Rfl: 2 .  folic acid (FOLVITE) 1 MG tablet, Take 1 tablet (1 mg total) by mouth daily., Disp: 60 tablet, Rfl: 0 .  gabapentin (NEURONTIN) 300 MG capsule, Take 1 capsule (300 mg total) by mouth 3 (three) times daily., Disp: 90 capsule, Rfl: 3 .  meloxicam (MOBIC) 7.5 MG tablet, Take 1 tablet (7.5 mg total) by mouth daily., Disp: 30 tablet, Rfl: 0 .  pantoprazole (PROTONIX) 40 MG tablet, Take 1 tablet (40 mg total) by mouth daily., Disp: 30 tablet, Rfl: 1 .  rosuvastatin (CRESTOR) 10 MG tablet, Take 1 tablet (10 mg total) by mouth daily at 6 PM., Disp: 30 tablet, Rfl: 3 .  sacubitril-valsartan (ENTRESTO) 24-26 MG, Take 1 tablet by mouth 2 (two) times  daily., Disp: 60 tablet, Rfl: 3 .  thiamine 100 MG tablet, Take 1 tablet (100 mg total) by mouth daily., Disp: 30 tablet, Rfl: 3 .  HYDROcodone-acetaminophen (NORCO/VICODIN) 5-325 MG tablet, Take 1-2 tablets by mouth every 6 (six) hours as needed for moderate pain (pain score 4-6). (Patient not taking: Reported on 07/24/2019), Disp: 42 tablet, Rfl: 0 Allergies  Allergen Reactions  . Spironolactone     gynecomastia  . Doxycycline Rash    Rash noted after administration of vancomycin, doxycycline, and ceftriaxone. Unclear cause of rash.  . Ibuprofen Rash  . Rocephin [Ceftriaxone] Rash    Rash noted after administration of vancomycin, doxycycline, and ceftriaxone. Unclear cause of rash.  . Tylenol [Acetaminophen] Rash  . Vancomycin Rash    Rash noted after administration of vancomycin, doxycycline, and ceftriaxone. Unclear cause of rash.     Social History   Socioeconomic History  . Marital status: Single    Spouse name: Not on file  . Number of children: Not on file  . Years of education: Not on file  . Highest education level: Not on file  Occupational History  . Not on file  Tobacco Use  . Smoking status: Current Every Day Smoker    Packs/day: 1.00    Types: Cigarettes  . Smokeless tobacco: Current User  Substance and Sexual Activity  . Alcohol use: Yes    Comment: Liquor daily - 1/2 of a fifth   . Drug  use: No  . Sexual activity: Not on file  Other Topics Concern  . Not on file  Social History Narrative  . Not on file   Social Determinants of Health   Financial Resource Strain: High Risk  . Difficulty of Paying Living Expenses: Very hard  Food Insecurity: Food Insecurity Present  . Worried About Charity fundraiser in the Last Year: Often true  . Ran Out of Food in the Last Year: Often true  Transportation Needs: Unmet Transportation Needs  . Lack of Transportation (Medical): Yes  . Lack of Transportation (Non-Medical): Yes  Physical Activity:   . Days of Exercise  per Week:   . Minutes of Exercise per Session:   Stress:   . Feeling of Stress :   Social Connections:   . Frequency of Communication with Friends and Family:   . Frequency of Social Gatherings with Friends and Family:   . Attends Religious Services:   . Active Member of Clubs or Organizations:   . Attends Archivist Meetings:   Marland Kitchen Marital Status:   Intimate Partner Violence:   . Fear of Current or Ex-Partner:   . Emotionally Abused:   Marland Kitchen Physically Abused:   . Sexually Abused:     Physical Exam Vitals reviewed.  Constitutional:      Appearance: He is normal weight.  HENT:     Head: Normocephalic.     Nose: Nose normal.     Mouth/Throat:     Mouth: Mucous membranes are moist.  Eyes:     Pupils: Pupils are equal, round, and reactive to light.  Cardiovascular:     Rate and Rhythm: Tachycardia present.     Pulses: Normal pulses.     Heart sounds: Normal heart sounds.  Pulmonary:     Effort: Pulmonary effort is normal.     Breath sounds: Normal breath sounds.  Abdominal:     General: Abdomen is flat.     Palpations: Abdomen is soft.  Musculoskeletal:        General: Normal range of motion.     Cervical back: Normal range of motion.     Right lower leg: No edema.     Left lower leg: No edema.  Skin:    General: Skin is warm and dry.     Capillary Refill: Capillary refill takes less than 2 seconds.  Neurological:     Mental Status: He is alert. Mental status is at baseline.  Psychiatric:        Mood and Affect: Mood normal.     Arrived for visit for Trevor Williams who was outside on the sidewalk at the homeless shelter. Trevor Williams reports feeling "okay" today. He denied chest pain, shortness of breath, dizziness, or trouble eating/sleeping. Trevor Williams did report some right sided hip and leg pain which he states he suffers from normally. Trevor Williams noted to have taken a few doses in his pill box since last week. Trevor Williams was encouraged to take his prescribed medication to  ensure he stays healthy and out of the hospital. Vitals were assessed and noted to be tachycardic with a lower blood pressure. Trevor Williams stated he is peeing a lot but not drinking a lot of water. I encouraged Trevor Williams to be sure to hydrate well since he is outside a lot in the heat. He stated he will improve his fluid intake but understands to not over do it. Trevor Williams agreed to visit in one week. I will be checking on pharmacy costs and pick  up meds for patient. Visit complete.        Future Appointments  Date Time Provider [place]  08/07/2019  9:00 AM [facility] PH[facility] None  08/11/2019 10:20 AM Raulkar, Clide Deutscher, MD [facility]  09/11/2019  1:40 PM Larey Dresser, MD [facility] None     ACTION: Home visit completed Next visit planned for one week

## 2019-07-31 ENCOUNTER — Ambulatory Visit: Payer: Medicaid Other | Admitting: Physical Medicine and Rehabilitation

## 2019-07-31 ENCOUNTER — Other Ambulatory Visit (HOSPITAL_COMMUNITY): Payer: Self-pay

## 2019-07-31 ENCOUNTER — Telehealth (HOSPITAL_COMMUNITY): Payer: Self-pay | Admitting: Licensed Clinical Social Worker

## 2019-07-31 NOTE — Progress Notes (Signed)
Paramedicine Encounter    Patient ID: Trevor Williams, male    DOB: Dec 06, 1960, 59 y.o.   MRN: [id number]   Patient Care Team: Elsie Stain, MD as PCP - General (Pulmonary Disease) Larey Dresser, MD as PCP - Advanced Heart Failure (Cardiology) Larey Dresser, MD as PCP - Cardiology (Cardiology)  Patient Active Problem List   Diagnosis Date Noted  . Closed nondisplaced intertrochanteric fracture of right femur ([place])   . Alcoholic intoxication without complication ([place]) 24/58/0998  . Chronic systolic heart failure ([place]) 03/03/2019  . Elevated LFTs 03/03/2019  . Left lateral knee pain 03/03/2019  . Iron deficiency anemia due to chronic blood loss 03/03/2019  . Hypomagnesemia 03/03/2019  . Alcohol use 01/26/2019  . Homeless 01/26/2019  . Hypokalemia 01/26/2019  . Simple chronic bronchitis ([place]) 01/25/2015  . Tobacco use disorder 01/25/2015  . Chronic midline low back pain with sciatica 12/25/2014    Current Outpatient Medications:  .  meloxicam (MOBIC) 7.5 MG tablet, TAKE 1 TABLET (7.5 MG TOTAL) BY MOUTH DAILY. (Patient not taking: Reported on 07/31/2019), Disp: 30 tablet, Rfl: 0 .  albuterol (VENTOLIN HFA) 108 (90 Base) MCG/ACT inhaler, Inhale 2 puffs into the lungs every 4 (four) hours as needed for wheezing or shortness of breath. (Patient not taking: Reported on 07/31/2019), Disp: 18 g, Rfl: 0 .  carvedilol (COREG) 3.125 MG tablet, Take 1 tablet (3.125 mg total) by mouth 2 (two) times daily with a meal. (Patient not taking: Reported on 07/31/2019), Disp: 60 tablet, Rfl: 2 .  folic acid (FOLVITE) 1 MG tablet, Take 1 tablet (1 mg total) by mouth daily. (Patient not taking: Reported on 07/31/2019), Disp: 60 tablet, Rfl: 0 .  gabapentin (NEURONTIN) 300 MG capsule, Take 1 capsule (300 mg total) by mouth 3 (three) times daily. (Patient not taking: Reported on 07/31/2019), Disp: 90 capsule, Rfl: 3 .  HYDROcodone-acetaminophen (NORCO/VICODIN) 5-325 MG tablet, Take 1-2 tablets by mouth  every 6 (six) hours as needed for moderate pain (pain score 4-6). (Patient not taking: Reported on 07/24/2019), Disp: 42 tablet, Rfl: 0 .  pantoprazole (PROTONIX) 40 MG tablet, Take 1 tablet (40 mg total) by mouth daily., Disp: 30 tablet, Rfl: 1 .  rosuvastatin (CRESTOR) 10 MG tablet, Take 1 tablet (10 mg total) by mouth daily at 6 PM. (Patient not taking: Reported on 07/31/2019), Disp: 30 tablet, Rfl: 3 .  sacubitril-valsartan (ENTRESTO) 24-26 MG, Take 1 tablet by mouth 2 (two) times daily. (Patient not taking: Reported on 07/31/2019), Disp: 60 tablet, Rfl: 3 .  thiamine 100 MG tablet, Take 1 tablet (100 mg total) by mouth daily. (Patient not taking: Reported on 07/31/2019), Disp: 30 tablet, Rfl: 3 Allergies  Allergen Reactions  . Spironolactone     gynecomastia  . Doxycycline Rash    Rash noted after administration of vancomycin, doxycycline, and ceftriaxone. Unclear cause of rash.  . Ibuprofen Rash  . Rocephin [Ceftriaxone] Rash    Rash noted after administration of vancomycin, doxycycline, and ceftriaxone. Unclear cause of rash.  . Tylenol [Acetaminophen] Rash  . Vancomycin Rash    Rash noted after administration of vancomycin, doxycycline, and ceftriaxone. Unclear cause of rash.     Social History   Socioeconomic History  . Marital status: Single    Spouse name: Not on file  . Number of children: Not on file  . Years of education: Not on file  . Highest education level: Not on file  Occupational History  . Not on file  Tobacco Use  .  Smoking status: Current Every Day Smoker    Packs/day: 1.00    Types: Cigarettes  . Smokeless tobacco: Current User  Substance and Sexual Activity  . Alcohol use: Yes    Comment: Liquor daily - 1/2 of a fifth   . Drug use: No  . Sexual activity: Not on file  Other Topics Concern  . Not on file  Social History Narrative  . Not on file   Social Determinants of Health   Financial Resource Strain: High Risk  . Difficulty of Paying Living  Expenses: Very hard  Food Insecurity: Food Insecurity Present  . Worried About Charity fundraiser in the Last Year: Often true  . Ran Out of Food in the Last Year: Often true  Transportation Needs: Unmet Transportation Needs  . Lack of Transportation (Medical): Yes  . Lack of Transportation (Non-Medical): Yes  Physical Activity:   . Days of Exercise per Week:   . Minutes of Exercise per Session:   Stress:   . Feeling of Stress :   Social Connections:   . Frequency of Communication with Friends and Family:   . Frequency of Social Gatherings with Friends and Family:   . Attends Religious Services:   . Active Member of Clubs or Organizations:   . Attends Archivist Meetings:   Marland Kitchen Marital Status:   Intimate Partner Violence:   . Fear of Current or Ex-Partner:   . Emotionally Abused:   Marland Kitchen Physically Abused:   . Sexually Abused:     Physical Exam Vitals reviewed.  Constitutional:      Appearance: Normal appearance. He is normal weight.  HENT:     Head: Normocephalic.     Nose: Nose normal.     Mouth/Throat:     Mouth: Mucous membranes are moist.  Eyes:     Pupils: Pupils are equal, round, and reactive to light.  Cardiovascular:     Rate and Rhythm: Regular rhythm. Tachycardia present.     Pulses: Normal pulses.     Heart sounds: Normal heart sounds.  Pulmonary:     Effort: Pulmonary effort is normal.     Breath sounds: Normal breath sounds.  Abdominal:     General: Abdomen is flat.     Palpations: Abdomen is soft.  Musculoskeletal:        General: Normal range of motion.     Cervical back: Normal range of motion.     Right lower leg: No edema.     Left lower leg: No edema.  Skin:    General: Skin is warm and dry.     Capillary Refill: Capillary refill takes less than 2 seconds.  Neurological:     Mental Status: He is alert. Mental status is at baseline.  Psychiatric:        Mood and Affect: Mood normal.    Arrived for visit for Traevon who met me out at  the homeless shelter where he was walking with his cane. Ajay was alert and oriented complaining of generalized chronic pain. Theodore denied shortness of breath, chest pain, dizziness. Herberth reports he has not taken any of his medications in a week due to depression and not wanting to take them because he stated they make him feel depressed. I explained to Jacquelyn that it is important for him to take his prescribed medication due to the cardiac history he has. He verbalized understanding. Pill box was full from last visit. Meds were verified. Tammy Sours was able to  reach out and Shermar agreed to take this weeks medications and we will reevaluate next week in the clinic. Vitals were obtained and are as noted in report. Donnel agreed to visit in clinic in one week. Visit complete.   Refills: NONE       Future Appointments  Date Time Provider [place]  08/07/2019  9:00 AM [facility] PH[facility] None  08/11/2019 10:20 AM Raulkar, Clide Deutscher, MD [facility] CPR  09/11/2019  1:40 PM Larey Dresser, MD [facility] None     ACTION: Home visit completed Next visit planned for one week

## 2019-07-31 NOTE — Telephone Encounter (Signed)
CSW informed by [employer] that pt has not been taking medications because he thinks they cause his depression.  CSW called pt to discuss.  Pt admits he has not been taking his medications consistently for awhile and has not taken anything but his pain medications for the past week.  Pt admits he has been feeling depressed despite not taking heart medications but still thinks there is a correlation.  CSW discussed importance of taking medication and that this would help [place] to determine if the medications are working and what side effects they might be responsible for.  CSW also discussed pt persistent depression despite medication compliance and that he has had a lot of life events that could be responsible for his depression.  Pt became agreeable to talking to a counselor to further explore his depression and potential treatment options.  CSW called [facility] and set pt up with a profile- pt will need to go to walk-in hours between 8am-3pm to complete registration and get set up with counseling- pt expressed understanding and will plan to go and get set up with appt.  CSW will continue to follow and assist as needed  Jorge Ny, [facility] Desk#: [phone] Cell#: [phone]

## 2019-08-05 ENCOUNTER — Telehealth (HOSPITAL_COMMUNITY): Payer: Self-pay | Admitting: Licensed Clinical Social Worker

## 2019-08-05 NOTE — Telephone Encounter (Signed)
Pt provided multiple forms to [employer] to bring to CSW for assistance.  First form was to sign up for new managed Medicaid plan- CSW filled out PCP information and will have pt sign when he comes in for pharmacy appt Thursday  Second form was to use Medicaid transportation services- pt just needs to sign on Thursday and I will send in.  Third form was for identity confirmation from the IRS.  Pt reports he worked with someone at the [facility] to complete his tax return this year so he could get the stimulus check.  The form states we need to have his tax return available but he cannot remember any online login information and states he was not given any by the [facility]. CSW attempted to call [employer] to see if they can clarify- left message requesting return call.  CSW attempted to call IRS to see if we could proceed to verify his identity without tax return but unable to reach anyone at this time.  CSW also put in taxi request for pt appt on Thursday as he confirms he does not have transportation.  Jorge Ny, LCSW Clinical Social Worker [facility] Desk#: [phone] Cell#: [phone]

## 2019-08-07 ENCOUNTER — Encounter (HOSPITAL_COMMUNITY): Payer: Self-pay

## 2019-08-07 ENCOUNTER — Other Ambulatory Visit (HOSPITAL_COMMUNITY): Payer: Self-pay

## 2019-08-07 ENCOUNTER — Other Ambulatory Visit: Payer: Self-pay

## 2019-08-07 ENCOUNTER — Ambulatory Visit (HOSPITAL_COMMUNITY)
Admission: RE | Admit: 2019-08-07 | Discharge: 2019-08-07 | Disposition: A | Discharge status: Home / Self Care | Payer: Medicaid Other | Source: Ambulatory Visit | Attending: Internal Medicine | Admitting: Internal Medicine

## 2019-08-07 VITALS — BP 112/76 | HR 95 | Ht 66.5 in | Wt 121.0 lb

## 2019-08-07 DIAGNOSIS — I5042 Chronic combined systolic (congestive) and diastolic (congestive) heart failure: Secondary | ICD-10-CM | POA: Diagnosis not present

## 2019-08-07 DIAGNOSIS — Z79899 Other long term (current) drug therapy: Secondary | ICD-10-CM | POA: Insufficient documentation

## 2019-08-07 DIAGNOSIS — I2510 Atherosclerotic heart disease of native coronary artery without angina pectoris: Secondary | ICD-10-CM | POA: Insufficient documentation

## 2019-08-07 DIAGNOSIS — F1010 Alcohol abuse, uncomplicated: Secondary | ICD-10-CM | POA: Insufficient documentation

## 2019-08-07 DIAGNOSIS — I5022 Chronic systolic (congestive) heart failure: Secondary | ICD-10-CM | POA: Diagnosis present

## 2019-08-07 DIAGNOSIS — I428 Other cardiomyopathies: Secondary | ICD-10-CM | POA: Diagnosis not present

## 2019-08-07 DIAGNOSIS — J449 Chronic obstructive pulmonary disease, unspecified: Secondary | ICD-10-CM | POA: Insufficient documentation

## 2019-08-07 DIAGNOSIS — Z7982 Long term (current) use of aspirin: Secondary | ICD-10-CM | POA: Diagnosis not present

## 2019-08-07 DIAGNOSIS — Z590 Homelessness: Secondary | ICD-10-CM | POA: Diagnosis not present

## 2019-08-07 DIAGNOSIS — F17210 Nicotine dependence, cigarettes, uncomplicated: Secondary | ICD-10-CM | POA: Insufficient documentation

## 2019-08-07 DIAGNOSIS — Z03818 Encounter for observation for suspected exposure to other biological agents ruled out: Secondary | ICD-10-CM | POA: Diagnosis not present

## 2019-08-07 LAB — BASIC METABOLIC PANEL
Anion gap: 11 (ref 5–15)
BUN: 7 mg/dL (ref 6–20)
CO2: 25 mmol/L (ref 22–32)
Calcium: 8.7 mg/dL — ABNORMAL LOW (ref 8.9–10.3)
Chloride: 107 mmol/L (ref 98–111)
Creatinine, Ser: 0.71 mg/dL (ref 0.61–1.24)
GFR calc Af Amer: >60 mL/min (ref >60)
GFR calc non Af Amer: >60 mL/min (ref >60)
Glucose, Bld: 93 mg/dL (ref 70–99)
Potassium: 3.4 mmol/L — ABNORMAL LOW (ref 3.5–5.1)
Sodium: 143 mmol/L (ref 135–145)

## 2019-08-07 NOTE — Progress Notes (Addendum)
TP:[id number], Burnett Harry, MD PCP-Cardiologist:Dalton Aundra Dubin, MD  HPI: Mr. Sobalvarro is a 59year old homeless male with a history of COPD, self-reported liver cirrhosis, tobacco use,alcohol abuseand chronic systolic HF 2/2 NICM.   He was admitted 07/2018 for acute hypoxic respiratory failure and diagnosed w/ acute CHF and CAP. Treated w/ IV Lasix and abx.Echocardiogram showedreducedLVEF of 15-20% with diffuse hypokinesis and grade 2 diastolic dysfunction as well as severely reduced RV systolic function. LHC showed mild-moderate nonobstructive disease(60% ostial stenosis in moderate branch off ramus).RHC showed normal filling pressures and preserved CO. Cause of CM felt to be most likely 2/2 ETOH abuse. He was started on Entresto 24-26BID+ Coreg 3.125 mg BID.  He was admitted 2/21 for mechanical fall, in the setting of intoxication resulting in Rt Hip Fx. General cardiology was consulted for surgical clearance. Echo was repeated and showed slightly improved LVEF at 30%. Was stable from cardiac/HF standpoint and felt to be low risk. He was cleared for surgery and underwent successfully ORIF by ortho. Perioperative course uncomplicated. He was placed back on his HF meds and discharged to a SNF for rehab.  At last HF clinic visit for pharmacist medication titration on 07/17/19, he complained of breast tenderness after starting spironolactone. He had stopped all medications other than his pain medications a few weeks prior to the appointment. He was recently started on paramedicine services Nira Conn) to help him with his medications. He was tearful in clinic because one of his friends recently passed away and he was dealing with that grief. He did not wish to talk about it at that time. He left his SNF and was staying at [facility]. No complaints of dizziness, lightheadedness, chest pain (other than breast tenderness) or palpitations. No scale at the shelter, but his weight was stable at 125  lbs in clinic. No LEE, PND or orthopnea. He did complain of R knee pain. Will be seen by Chronic Pain Management.   Today he returns to HF clinic for pharmacist medication titration. At last visit with pharmacy clinic, carvedilol 3.125 mg BID and Entresto 24/26 mg BID were "restarted". Per paramedic who fills his weekly pill box, he has not taken his medications in the last 2 weeks. He states that he thought the medications were "making him feel depressed".  Symptomatically, he has been doing worse. He complains of pain and depression and is trying to get a PCP appointment to discuss these things. He denies dizziness, lightheadedness, and fatigue. No chest pain or palpitations. He has been more SOB lately and is limited in his physical activity since he has to use a cane to walk. He is trying to decrease his cigarette smoking. He is able to complete all ADLs. He does not check his weight at home but weight has been stable in clinic. Weight today was down 3 lbs since last visit. No LEE. He does endorse some PND which occurs infrequently. No orthopnea. He sleeps on 2 pillows. His appetite has been about the same lately and he admits to non-adherence with low-salt diet. Discussed decreased sodium intake.   HF Medications: Carvedilol 3.125 mg BID Entresto 24/26 mg BID  Has the patient been experiencing any side effects to the medications prescribed?  no  Does the patient have any problems obtaining medications due to transportation or finances?Yes, homelessness - has [place] Medicaid prescription insurance. Medications filled at [facility].  Understanding of regimen: poor Understanding of indications: poor Potential of compliance: poor Patient understands to avoid NSAIDs. Patient understands to avoid  decongestants.   Pertinent Lab Values (08/07/19): Marland Kitchen Serum creatinine 0.71, BUN 7, Potassium 3.4, Sodium 143, BNP 54.3 pg/mL (07/17/19)  Vital Signs: . Weight: 121 lbs (last clinic weight: 124 lbs) . Blood  pressure: 112/76  . Heart rate: 95   Assessment: 1.Chronic Combined 82 DiastolicHF:Echo [id number] EF 15-20% with moderate LV dilation, severely decreased RV systolic function.Nonischemic cardiomyopathy based on coronary angiography.RHC showed normal filling pressure and preserved CO/CI.Echo repeated 2/21. EF 30%.It is possible that his cardiomyopathy is due to heavy ETOH use.  - NYHA Class II-III. Euvolemic on exam today -As noted above, he stopped all his HF medications in early April because he started having gynecomastia with spironolactone. He restarted these medications for about 1 week after his last appointment on 07/17/19 and then stopped taking them again about 2 weeks ago.  - Vitals: BP 112/76, HR 95 - Labs: Scr 0.71; K low at 3.4. Will hopefully increase with restarting Entresto. Will also increase K in diet.  - Restart carvedilol 3.125 mg BID - Restart Entresto 24/26 mg BID - Will not restart spironolactone due to complaints of gynecomastia. Could potentially tolerate eplerenone in the future.  - Has paramedicine to help with medication adherence. Nira Conn) - Needsto continue toabstainfrom ETOH - Will try to titrate HF meds gradually. Once on optimal medical therapy x 3 months, will need repeat Echo to reassess LVEF. If <35%, will need to refer to EP for potential ICD.   2. [id number] CAD on cath 5/20(60% ostial stenosis in moderate branch off ramus).  - No s/s of ischemia -Continue ASA 81 daily.  -Continuerosuvastatin 10 mg daily.LDL 05/20 was controlled at 45 mg/dL.  3. COPD:no active wheezing on exam - Smoking cessation advised  4. ETOH abuse: -Advised abstinancefrom ETOH  5. Rt Hip Fx: s/p ORIF 2/21.    6. Homelessness:SW consulted.  - Ptnow enrolled with paramedicine  - Currently living at [place]: 1) Medication changes: Based on clinical presentation, vital signs and recent labs will restart  carvedilol 3.125 mg BID and Entresto 24/26 mg BID 2) Labs: scr 0.71, K 3.4 3) Follow-up: 4 weeks with Dr. Ephriam Jenkins, PharmD PGY1 Ambulatory Care Pharmacy Resident  Audry Riles, PharmD, BCPS, [facility], CPP Heart Failure Clinic Pharmacist [phone]

## 2019-08-07 NOTE — Progress Notes (Signed)
Saw Chung in the clinic today who was seen by Audry Riles the pharmacist. Mr. Sickles reports he has not taken his medications in the last week. When I saw him last week he had not taken any from the previous week so overall two week without medications. Mr. Raile reports having some depression and fatigue. We explained to Mr. Jimison the importance of taking his medications and that in order to get his heart working better to increase his overall health he has to take his medications. He verbally agreed with same. Mr. Duff was also educated on his diet and the importance of eating less salt and making sure he stays hydrated with the appropriate liquids. Legrand Como verbally agreed. I will see [place] in one week at the shelter. I am getting him scheduled for PCP follow up as well.

## 2019-08-07 NOTE — Patient Instructions (Signed)
It was a pleasure seeing you today!  MEDICATIONS: -No medication changes today -Please restart your carvedilol and Entresto -Call if you have questions about your medications.  LABS: -We will call you if your labs need attention.  NEXT APPOINTMENT: Return to clinic in 4 weeks with Dr. Aundra Dubin.  In general, to take care of your heart failure: -Limit your fluid intake to 2 Liters (half-gallon) per day.   -Limit your salt intake to ideally 2-3 grams (2000-3000 mg) per day. -Weigh yourself daily and record, and bring that "weight diary" to your next appointment.  (Weight gain of 2-3 pounds in 1 day typically means fluid weight.) -The medications for your heart are to help your heart and help you live longer.   -Please contact us before stopping any of your heart medications.  Call the clinic at [phone] with questions or to reschedule future appointments.

## 2019-08-11 ENCOUNTER — Telehealth (HOSPITAL_COMMUNITY): Payer: Self-pay | Admitting: Licensed Clinical Social Worker

## 2019-08-11 ENCOUNTER — Encounter
Payer: Medicaid Other | Attending: Physical Medicine and Rehabilitation | Admitting: Physical Medicine and Rehabilitation

## 2019-08-11 NOTE — Telephone Encounter (Signed)
Community paramedic returned signed paperwork for pt [employer] and choosing a managed Medicaid plan.  CSW faxed in both forms to [facility]- fax confirmations received for both forms.  CSW will continue to follow and assist as needed  Jorge Ny, [facility] Desk#: [phone] Cell#: [phone]

## 2019-08-14 ENCOUNTER — Telehealth (HOSPITAL_COMMUNITY): Payer: Self-pay | Admitting: Licensed Clinical Social Worker

## 2019-08-14 NOTE — Telephone Encounter (Signed)
CSW informed by [employer] that they have been unable to contact pt and phone appears to be down.  CSW looked on trac phone website and verified that pt should still have 45 minutes left on phone.  CSW also attempted to call pt with no success.  Rebecca and the shift manager there states that he stopped showing up for the last several days so they are not sure where he is.  I gave them my number and they put it on his chart for if he shows up again.  CSW updated paramedic who has been trying to drive around to places the patient hangs out to locate- we will continue to try and locate pt so we can reconnect.  Jorge Ny, LCSW Clinical Social Worker [facility] Desk#: [phone] Cell#: [phone]

## 2019-08-18 DIAGNOSIS — Z03818 Encounter for observation for suspected exposure to other biological agents ruled out: Secondary | ICD-10-CM | POA: Diagnosis not present

## 2019-08-19 ENCOUNTER — Telehealth (HOSPITAL_COMMUNITY): Payer: Self-pay | Admitting: Licensed Clinical Social Worker

## 2019-08-19 NOTE — Telephone Encounter (Signed)
CSW called [employer] to see if patient had come back to stay there again- they state he has still not been back.  CSW called pt phone number and patient picked up.  States his phone stopped working last week and it just started working again randomly this week- CSW confirmed he still has 40 minutes left on his phone.  Pt states he decided to leave the [facility] because it was too much drama and is now back [place] the [place].  Pt states he can get everything he needs out there.  Pt is still agreeable to being seen by [employer]- Paramedic updated that pt phone now working and she can call to coordinate meeting up with pt.  CSW updated [employer] and [facility] regarding pt current living status so they can follow up appropriately.  CSW will continue to follow and assist as needed  Jorge Ny, [facility] Desk#: [phone] Cell#: [phone]

## 2019-08-21 ENCOUNTER — Emergency Department (HOSPITAL_COMMUNITY): Payer: Medicaid Other

## 2019-08-21 ENCOUNTER — Emergency Department (HOSPITAL_COMMUNITY)
Admission: EM | Admit: 2019-08-21 | Discharge: 2019-08-21 | Disposition: A | Discharge status: Home / Self Care | Payer: Medicaid Other | Attending: Emergency Medicine | Admitting: Emergency Medicine

## 2019-08-21 ENCOUNTER — Other Ambulatory Visit: Payer: Self-pay

## 2019-08-21 ENCOUNTER — Encounter (HOSPITAL_COMMUNITY): Payer: Self-pay

## 2019-08-21 ENCOUNTER — Telehealth (HOSPITAL_COMMUNITY): Payer: Self-pay

## 2019-08-21 DIAGNOSIS — F17210 Nicotine dependence, cigarettes, uncomplicated: Secondary | ICD-10-CM | POA: Diagnosis not present

## 2019-08-21 DIAGNOSIS — R0602 Shortness of breath: Secondary | ICD-10-CM | POA: Diagnosis not present

## 2019-08-21 DIAGNOSIS — R0600 Dyspnea, unspecified: Secondary | ICD-10-CM | POA: Diagnosis present

## 2019-08-21 DIAGNOSIS — I5022 Chronic systolic (congestive) heart failure: Secondary | ICD-10-CM | POA: Insufficient documentation

## 2019-08-21 DIAGNOSIS — Z590 Homelessness: Secondary | ICD-10-CM | POA: Diagnosis not present

## 2019-08-21 DIAGNOSIS — Z85828 Personal history of other malignant neoplasm of skin: Secondary | ICD-10-CM | POA: Insufficient documentation

## 2019-08-21 DIAGNOSIS — F10920 Alcohol use, unspecified with intoxication, uncomplicated: Secondary | ICD-10-CM

## 2019-08-21 DIAGNOSIS — I2510 Atherosclerotic heart disease of native coronary artery without angina pectoris: Secondary | ICD-10-CM | POA: Diagnosis not present

## 2019-08-21 DIAGNOSIS — M25561 Pain in right knee: Secondary | ICD-10-CM | POA: Diagnosis not present

## 2019-08-21 DIAGNOSIS — F10129 Alcohol abuse with intoxication, unspecified: Secondary | ICD-10-CM | POA: Diagnosis not present

## 2019-08-21 DIAGNOSIS — M25551 Pain in right hip: Secondary | ICD-10-CM | POA: Diagnosis not present

## 2019-08-21 LAB — CBC WITH DIFFERENTIAL/PLATELET
Abs Immature Granulocytes: 0.01 10*3/uL (ref 0.00–0.07)
Basophils Absolute: 0 10*3/uL (ref 0.0–0.1)
Basophils Relative: 1 %
Eosinophils Absolute: 0.1 10*3/uL (ref 0.0–0.5)
Eosinophils Relative: 2 %
HCT: 32.3 % — ABNORMAL LOW (ref 39.0–52.0)
Hemoglobin: 11.1 g/dL — ABNORMAL LOW (ref 13.0–17.0)
Immature Granulocytes: 0 %
Lymphocytes Relative: 54 %
Lymphs Abs: 2.2 10*3/uL (ref 0.7–4.0)
MCH: 36.2 pg — ABNORMAL HIGH (ref 26.0–34.0)
MCHC: 34.4 g/dL (ref 30.0–36.0)
MCV: 105.2 fL — ABNORMAL HIGH (ref 80.0–100.0)
Monocytes Absolute: 0.3 10*3/uL (ref 0.1–1.0)
Monocytes Relative: 7 %
Neutro Abs: 1.5 10*3/uL — ABNORMAL LOW (ref 1.7–7.7)
Neutrophils Relative %: 36 %
Platelets: 28 10*3/uL — CL (ref 150–400)
RBC: 3.07 MIL/uL — ABNORMAL LOW (ref 4.22–5.81)
RDW: 14.3 % (ref 11.5–15.5)
WBC: 4.2 10*3/uL (ref 4.0–10.5)
nRBC: 0 % (ref 0.0–0.2)

## 2019-08-21 LAB — BASIC METABOLIC PANEL
Anion gap: 17 — ABNORMAL HIGH (ref 5–15)
BUN: 19 mg/dL (ref 6–20)
CO2: 20 mmol/L — ABNORMAL LOW (ref 22–32)
Calcium: 8.5 mg/dL — ABNORMAL LOW (ref 8.9–10.3)
Chloride: 101 mmol/L (ref 98–111)
Creatinine, Ser: 0.91 mg/dL (ref 0.61–1.24)
GFR calc Af Amer: >60 mL/min (ref >60)
GFR calc non Af Amer: >60 mL/min (ref >60)
Glucose, Bld: 81 mg/dL (ref 70–99)
Potassium: 3.3 mmol/L — ABNORMAL LOW (ref 3.5–5.1)
Sodium: 138 mmol/L (ref 135–145)

## 2019-08-21 LAB — BRAIN NATRIURETIC PEPTIDE: B Natriuretic Peptide: 53.9 pg/mL (ref 0.0–100.0)

## 2019-08-21 LAB — TROPONIN I (HIGH SENSITIVITY)
Troponin I (High Sensitivity): 12 ng/L (ref <18)
Troponin I (High Sensitivity): 14 ng/L (ref <18)

## 2019-08-21 MED ORDER — ALBUTEROL SULFATE HFA 108 (90 BASE) MCG/ACT IN AERS
2.0000 | INHALATION_SPRAY | RESPIRATORY_TRACT | Status: DC | PRN
  Administered 2019-08-21: 2 via RESPIRATORY_TRACT
  Filled 2019-08-21: qty 6.7

## 2019-08-21 NOTE — ED Provider Notes (Signed)
[facility] DEPT Provider Note   CSN: [id number] Arrival date & time: 08/21/19  0006     History Chief Complaint  Patient presents with  . Alcohol Intoxication    Trevor Williams is a 59 y.o. male.  Patient presents to the emergency department by ambulance.  He comes from a homeless camp.  He called EMS because he has been having severe difficulty breathing for 2 weeks.  He admits to alcohol abuse.  Patient reports that he is having right leg pain because his right hip, right knee and right ankle are "broken".        Past Medical History:  Diagnosis Date  . Abnormal liver function tests   . Acute respiratory failure ([place])    a. 07/2018 requiring intubation - CAP/CHF.  Marland Kitchen Alcohol abuse   . Biventricular heart failure ([place])   . Cancer ([place])    skin (left hand)  . Delirium    a. h/o delirium while admitted  . Emphysema of lung ([place])   . Homelessness   . Macrocytic anemia   . Mild CAD    a. cath 07/2018 60% ostial ramus otherwise OK.  . Mitral regurgitation   . NICM (nonischemic cardiomyopathy) ([place])   . Protein calorie malnutrition ([place])   . Tricuspid regurgitation     Patient Active Problem List   Diagnosis Date Noted  . Closed nondisplaced intertrochanteric fracture of right femur ([place])   . Alcoholic intoxication without complication ([place]) [id number]  . Chronic systolic heart failure ([place]) 03/03/2019  . Elevated LFTs 03/03/2019  . Left lateral knee pain 03/03/2019  . Iron deficiency anemia due to chronic blood loss 03/03/2019  . Hypomagnesemia 03/03/2019  . Alcohol use 01/26/2019  . Homeless 01/26/2019  . Hypokalemia 01/26/2019  . Simple chronic bronchitis ([place]) 01/25/2015  . Tobacco use disorder 01/25/2015  . Chronic midline low back pain with sciatica 12/25/2014    Past Surgical History:  Procedure Laterality Date  . BIOPSY  01/28/2019   Procedure: BIOPSY;  Surgeon: Otis Brace, MD;  Location: [facility];  Service:  Gastroenterology;;  . BIOPSY  01/30/2019   Procedure: BIOPSY;  Surgeon: Otis Brace, MD;  Location: [facility];  Service: Gastroenterology;;  . COLONOSCOPY WITH PROPOFOL N/A 01/30/2019   Procedure: COLONOSCOPY WITH PROPOFOL;  Surgeon: Otis Brace, MD;  Location: [facility];  Service: Gastroenterology;  Laterality: N/A;  . ESOPHAGOGASTRODUODENOSCOPY (EGD) WITH PROPOFOL N/A 01/28/2019   Procedure: ESOPHAGOGASTRODUODENOSCOPY (EGD) WITH PROPOFOL;  Surgeon: Otis Brace, MD;  Location: [facility];  Service: Gastroenterology;  Laterality: N/A;  . FEMUR IM NAIL Right 04/30/2019   Procedure: INTRAMEDULLARY (IM) NAIL FEMORAL;  Surgeon: Paralee Cancel, MD;  Location: [facility];  Service: Orthopedics;  Laterality: Right;  . HEMOSTASIS CLIP PLACEMENT  01/30/2019   Procedure: HEMOSTASIS CLIP PLACEMENT;  Surgeon: Otis Brace, MD;  Location: [facility];  Service: Gastroenterology;;  . POLYPECTOMY  01/30/2019   Procedure: POLYPECTOMY;  Surgeon: Otis Brace, MD;  Location: [facility];  Service: Gastroenterology;;  . RIGHT/LEFT HEART CATH AND CORONARY ANGIOGRAPHY N/A 08/09/2018   Procedure: RIGHT/LEFT HEART CATH AND CORONARY ANGIOGRAPHY;  Surgeon: Larey Dresser, MD;  Location: [place] CV LAB;  Service: Cardiovascular;  Laterality: N/A;       Family History  Problem Relation Age of Onset  . Hypertension Maternal Grandfather     Social History   Tobacco Use  . Smoking status: Current Every Day Smoker    Packs/day: 1.00    Types: Cigarettes  . Smokeless tobacco:  Current User  Substance Use Topics  . Alcohol use: Yes    Comment: Liquor daily - 1/2 of a fifth   . Drug use: No    Home Medications Prior to Admission medications   Medication Sig Start Date End Date Taking? Authorizing Provider  meloxicam (MOBIC) 7.5 MG tablet TAKE 1 TABLET (7.5 MG TOTAL) BY MOUTH DAILY. Patient not taking: Reported on 07/31/2019 07/25/19   Elsie Stain, MD  albuterol  (VENTOLIN HFA) 108 (90 Base) MCG/ACT inhaler Inhale 2 puffs into the lungs every 4 (four) hours as needed for wheezing or shortness of breath. Patient not taking: Reported on 07/31/2019 03/20/19   Elsie Stain, MD  carvedilol (COREG) 3.125 MG tablet Take 1 tablet (3.125 mg total) by mouth 2 (two) times daily with a meal. Patient not taking: Reported on 07/31/2019 06/25/19   Elsie Stain, MD  folic acid (FOLVITE) 1 MG tablet Take 1 tablet (1 mg total) by mouth daily. Patient not taking: Reported on 08/21/2019 06/25/19   Elsie Stain, MD  gabapentin (NEURONTIN) 300 MG capsule Take 1 capsule (300 mg total) by mouth 3 (three) times daily. Patient not taking: Reported on 07/31/2019 06/25/19   Elsie Stain, MD  HYDROcodone-acetaminophen (NORCO/VICODIN) 5-325 MG tablet Take 1-2 tablets by mouth every 6 (six) hours as needed for moderate pain (pain score 4-6). Patient not taking: Reported on 07/24/2019 05/01/19   Maurice March, PA-C  pantoprazole (PROTONIX) 40 MG tablet Take 1 tablet (40 mg total) by mouth daily. Patient not taking: Reported on 08/21/2019 06/25/19   Elsie Stain, MD  rosuvastatin (CRESTOR) 10 MG tablet Take 1 tablet (10 mg total) by mouth daily at 6 PM. Patient not taking: Reported on 07/31/2019 06/25/19   Elsie Stain, MD  sacubitril-valsartan (ENTRESTO) 24-26 MG Take 1 tablet by mouth 2 (two) times daily. Patient not taking: Reported on 07/31/2019 06/25/19   Elsie Stain, MD  thiamine 100 MG tablet Take 1 tablet (100 mg total) by mouth daily. Patient not taking: Reported on 08/21/2019 03/20/19   Elsie Stain, MD    Allergies    Spironolactone, Doxycycline, Ibuprofen, Rocephin [ceftriaxone], Tylenol [acetaminophen], and Vancomycin  Review of Systems   Review of Systems  Respiratory: Positive for cough and shortness of breath.   Musculoskeletal: Positive for arthralgias.  All other systems reviewed and are negative.   Physical Exam Updated Vital Signs BP (!)  94/56 (BP Location: Right Arm)   Pulse 79   Temp 97.7 F (36.5 C) (Oral)   Resp 17   SpO2 95%   Physical Exam Vitals and nursing note reviewed.  Constitutional:      General: He is not in acute distress.    Appearance: Normal appearance. He is well-developed.  HENT:     Head: Normocephalic and atraumatic.     Right Ear: Hearing normal.     Left Ear: Hearing normal.     Nose: Nose normal.  Eyes:     Conjunctiva/sclera: Conjunctivae normal.     Pupils: Pupils are equal, round, and reactive to light.  Cardiovascular:     Rate and Rhythm: Regular rhythm.     Heart sounds: S1 normal and S2 normal. No murmur. No friction rub. No gallop.   Pulmonary:     Effort: Pulmonary effort is normal. No respiratory distress.     Breath sounds: Normal breath sounds.  Chest:     Chest wall: No tenderness.  Abdominal:     General:  Bowel sounds are normal.     Palpations: Abdomen is soft.     Tenderness: There is no abdominal tenderness. There is no guarding or rebound. Negative signs include Murphy's sign and McBurney's sign.     Hernia: No hernia is present.  Musculoskeletal:        General: Normal range of motion.     Cervical back: Normal range of motion and neck supple.  Skin:    General: Skin is warm and dry.     Findings: No rash.  Neurological:     Mental Status: He is alert and oriented to person, place, and time.     GCS: GCS eye subscore is 4. GCS verbal subscore is 5. GCS motor subscore is 6.     Cranial Nerves: No cranial nerve deficit.     Sensory: No sensory deficit.     Coordination: Coordination normal.  Psychiatric:        Speech: Speech normal.        Behavior: Behavior normal.        Thought Content: Thought content normal.     ED Results / Procedures / Treatments   Labs (all labs ordered are listed, but only abnormal results are displayed) Labs Reviewed  CBC WITH DIFFERENTIAL/PLATELET - Abnormal; Notable for the following components:      Result Value   RBC  3.07 (*)    Hemoglobin 11.1 (*)    HCT 32.3 (*)    MCV 105.2 (*)    MCH 36.2 (*)    Platelets 28 (*)    Neutro Abs 1.5 (*)    All other components within normal limits  BASIC METABOLIC PANEL - Abnormal; Notable for the following components:   Potassium 3.3 (*)    CO2 20 (*)    Calcium 8.5 (*)    Anion gap 17 (*)    All other components within normal limits  BRAIN NATRIURETIC PEPTIDE  TROPONIN I (HIGH SENSITIVITY)  TROPONIN I (HIGH SENSITIVITY)    EKG EKG Interpretation  Date/Time:  Thursday August 21 2019 01:17:51 EDT Ventricular Rate:  81 PR Interval:  120 QRS Duration: 82 QT Interval:  418 QTC Calculation: 485 R Axis:   81 Text Interpretation: Normal sinus rhythm Prolonged QT Abnormal ECG Confirmed by Orpah Greek [phone]) on 08/21/2019 1:38:17 AM   Radiology DG Chest 1 View  Result Date: 08/21/2019 CLINICAL DATA:  Dizziness, shortness of breath EXAM: CHEST  1 VIEW COMPARISON:  02/20/2019 FINDINGS: Cardiac shadow is within normal limits. The lungs are mildly hyperinflated. No focal infiltrate or effusion is seen. No bony abnormality is noted. IMPRESSION: Mild hyperinflation without acute abnormality. Electronically Signed   By: Inez Catalina M.D.   On: 08/21/2019 01:05   DG Knee Complete 4 Views Right  Result Date: 08/21/2019 CLINICAL DATA:  Right knee pain, history of prior fracture, initial encounter EXAM: RIGHT KNEE - COMPLETE 4+ VIEW COMPARISON:  07/02/2019 FINDINGS: Healed proximal fibular fracture is noted. No acute fracture or dislocation is seen. No joint effusion is noted. No soft tissue changes are seen. IMPRESSION: No healed proximal fibular fracture.  No acute abnormality noted. Electronically Signed   By: Inez Catalina M.D.   On: 08/21/2019 01:04   DG Hip Unilat W or Wo Pelvis 2-3 Views Right  Result Date: 08/21/2019 CLINICAL DATA:  Hip pain, no known injury, initial encounter EXAM: DG HIP (WITH OR WITHOUT PELVIS) 2-3V RIGHT COMPARISON:  07/02/2019 FINDINGS:  Changes consistent with prior surgical fixation in the  proximal right femur. Pelvic ring is intact. No hardware failure is noted. No soft tissue changes are seen IMPRESSION: Postsurgical changes without acute abnormality. Electronically Signed   By: Inez Catalina M.D.   On: 08/21/2019 01:03    Procedures Procedures (including critical care time)  Medications Ordered in ED Medications  albuterol (VENTOLIN HFA) 108 (90 Base) MCG/ACT inhaler 2 puff (has no administration in time range)    ED Course  I have reviewed the triage vital signs and the nursing notes.  Pertinent labs & imaging results that were available during my care of the patient were reviewed by me and considered in my medical decision making (see chart for details).    MDM Rules/Calculators/A&P                      Patient presents to the emergency department for evaluation of difficulty breathing.  Patient reports that he has been having this difficulty for a number of weeks.  Records do indicate that he has a history of diastolic heart failure.  He does not appear to be exhibiting any signs of decompensated heart failure.  Chest x-ray shows mild hyperinflation but no pneumonia or edema.  He is exhibiting adequate air movement and oxygenation currently.  He does have a mild productive cough, likely from chronic smoking.  Cardiac evaluation is otherwise unremarkable, EKG, troponins negative.  He has been sleeping comfortably here in the emergency department without any distress.  He complains of right hip and knee pain.  He does have a history of fractures in his areas but his surgical intervention is intact in the femur area and fibula fracture has healed, no abnormality in the knee noted.   Final Clinical Impression(s) / ED Diagnoses Final diagnoses:  Alcoholic intoxication without complication [facility])    Rx / DC Orders ED Discharge Orders    None       Jatia Musa, Gwenyth Allegra, MD 08/21/19 [phone]

## 2019-08-21 NOTE — ED Notes (Signed)
Date and time results received: 08/21/19 1:43 AM (use smartphrase ".now" to insert current time)  Test: Platelets  Critical Value: 28   Name of Provider Notified: Pollina  Orders Received? Or Actions Taken?:

## 2019-08-21 NOTE — ED Triage Notes (Signed)
Patient arrived with complaints of "respiratory distress" over the last two weeks. Patient has been cleared at [facility] for same per EMS. ETOH on board

## 2019-08-21 NOTE — Discharge Instructions (Addendum)
Use the inhaler every 4 hours as needed for shortness of breath

## 2019-08-21 NOTE — Telephone Encounter (Signed)
Attempted to reach Trevor Williams by phone after he agreed for [place] to meet up today at the [facility] on [facility] for his weekly paramedicine visit. He reported to me last night that he is no longer staying at [facility] and he is staying at a homeless camp over near [facility]. I drove around today looking for him around 0900 and had no luck and no answer on phone attempts. I will follow up again next week.

## 2019-08-22 ENCOUNTER — Telehealth (HOSPITAL_COMMUNITY): Payer: Self-pay | Admitting: Licensed Clinical Social Worker

## 2019-08-22 NOTE — Telephone Encounter (Signed)
CSW attempted to call pt again to check in after ED visit yesterday.  Phone rings but no answer and unable to leave a VM.  CSW checked pt tracfone balance and pt only has 5 minutes left.  CSW able to add an unlimited 30 day talk plan so will expire on 7/4  CSW will continue to reach out and attempt contact.  Jorge Ny, LCSW Clinical Social Worker [facility] Desk#: [phone] Cell#: [phone]

## 2019-08-25 ENCOUNTER — Telehealth (HOSPITAL_COMMUNITY): Payer: Self-pay

## 2019-08-25 ENCOUNTER — Telehealth (HOSPITAL_COMMUNITY): Payer: Self-pay | Admitting: Licensed Clinical Social Worker

## 2019-08-25 NOTE — Telephone Encounter (Signed)
CSW called pt to check in this morning.  Pt states that he is doing better following his ED visit last week- still having some hip pain but not as bad as it was and feels like they inhaler they gave him to help with his breathing has been working.  Pt reports he is out of all his medications- paramedic has been unable to meet up with pt for over 2 weeks due to communication issues so CSW messaged paramedic to inform pt is out of medications and requesting they see early this week if possible and help get medications for him.  CSW will continue to follow and assist as needed  Trevor Williams, [facility] Desk#: [phone] Cell#: [phone]

## 2019-08-25 NOTE — Telephone Encounter (Signed)
Attempted to reach Mr. Trevor Williams in reference to visit for tomorrow. Will continue to attempt to reach to determine meet up location.

## 2019-08-26 ENCOUNTER — Other Ambulatory Visit (HOSPITAL_COMMUNITY): Payer: Self-pay

## 2019-08-26 NOTE — Progress Notes (Signed)
Paramedicine Encounter    Patient ID: Trevor Williams, male    DOB: Sep 29, 1960, 59 y.o.   MRN: [id number]   Patient Care Team: Trevor Stain, MD as PCP - General (Pulmonary Disease) Trevor Dresser, MD as PCP - Advanced Heart Failure (Cardiology) Trevor Dresser, MD as PCP - Cardiology (Cardiology)  Patient Active Problem List   Diagnosis Date Noted   Closed nondisplaced intertrochanteric fracture of right femur [facility])    Alcoholic intoxication without complication ([place]) 14/43/1540   Chronic systolic heart failure ([place]) 03/03/2019   Elevated LFTs 03/03/2019   Left lateral knee pain 03/03/2019   Iron deficiency anemia due to chronic blood loss 03/03/2019   Hypomagnesemia 03/03/2019   Alcohol use 01/26/2019   Homeless 01/26/2019   Hypokalemia 01/26/2019   Simple chronic bronchitis ([place]) 01/25/2015   Tobacco use disorder 01/25/2015   Chronic midline low back pain with sciatica 12/25/2014    Current Outpatient Medications:    meloxicam (MOBIC) 7.5 MG tablet, TAKE 1 TABLET (7.5 MG TOTAL) BY MOUTH DAILY. (Patient not taking: Reported on 07/31/2019), Disp: 30 tablet, Rfl: 0   albuterol (VENTOLIN HFA) 108 (90 Base) MCG/ACT inhaler, Inhale 2 puffs into the lungs every 4 (four) hours as needed for wheezing or shortness of breath. (Patient not taking: Reported on 07/31/2019), Disp: 18 g, Rfl: 0   carvedilol (COREG) 3.125 MG tablet, Take 1 tablet (3.125 mg total) by mouth 2 (two) times daily with a meal. (Patient not taking: Reported on 07/31/2019), Disp: 60 tablet, Rfl: 2   folic acid (FOLVITE) 1 MG tablet, Take 1 tablet (1 mg total) by mouth daily. (Patient not taking: Reported on 08/21/2019), Disp: 60 tablet, Rfl: 0   gabapentin (NEURONTIN) 300 MG capsule, Take 1 capsule (300 mg total) by mouth 3 (three) times daily. (Patient not taking: Reported on 07/31/2019), Disp: 90 capsule, Rfl: 3   HYDROcodone-acetaminophen (NORCO/VICODIN) 5-325 MG tablet, Take 1-2 tablets by mouth every  6 (six) hours as needed for moderate pain (pain score 4-6). (Patient not taking: Reported on 07/24/2019), Disp: 42 tablet, Rfl: 0   pantoprazole (PROTONIX) 40 MG tablet, Take 1 tablet (40 mg total) by mouth daily. (Patient not taking: Reported on 08/21/2019), Disp: 30 tablet, Rfl: 1   rosuvastatin (CRESTOR) 10 MG tablet, Take 1 tablet (10 mg total) by mouth daily at 6 PM. (Patient not taking: Reported on 07/31/2019), Disp: 30 tablet, Rfl: 3   sacubitril-valsartan (ENTRESTO) 24-26 MG, Take 1 tablet by mouth 2 (two) times daily. (Patient not taking: Reported on 07/31/2019), Disp: 60 tablet, Rfl: 3   thiamine 100 MG tablet, Take 1 tablet (100 mg total) by mouth daily. (Patient not taking: Reported on 08/21/2019), Disp: 30 tablet, Rfl: 3 Allergies  Allergen Reactions   Spironolactone     gynecomastia   Doxycycline Rash    Rash noted after administration of vancomycin, doxycycline, and ceftriaxone. Unclear cause of rash.   Ibuprofen Rash   Rocephin [Ceftriaxone] Rash    Rash noted after administration of vancomycin, doxycycline, and ceftriaxone. Unclear cause of rash.   Tylenol [Acetaminophen] Rash   Vancomycin Rash    Rash noted after administration of vancomycin, doxycycline, and ceftriaxone. Unclear cause of rash.     Social History   Socioeconomic History   Marital status: Single    Spouse name: Not on file   Number of children: Not on file   Years of education: Not on file   Highest education level: Not on file  Occupational History   Not  on file  Tobacco Use   Smoking status: Current Every Day Smoker    Packs/day: 1.00    Types: Cigarettes   Smokeless tobacco: Current User  Substance and Sexual Activity   Alcohol use: Yes    Comment: Liquor daily - 1/2 of a fifth    Drug use: No   Sexual activity: Not on file  Other Topics Concern   Not on file  Social History Narrative   Not on file   Social Determinants of Health   Financial Resource Strain: High Risk    Difficulty of Paying Living Expenses: Very hard  Food Insecurity: Food Insecurity Present   Worried About Charity fundraiser in the Last Year: Often true   Arboriculturist in the Last Year: Often true  Transportation Needs: Public librarian (Medical): Yes   Lack of Transportation (Non-Medical): Yes  Physical Activity:    Days of Exercise per Week:    Minutes of Exercise per Session:   Stress:    Feeling of Stress :   Social Connections:    Frequency of Communication with Friends and Family:    Frequency of Social Gatherings with Friends and Family:    Attends Religious Services:    Active Member of Clubs or Organizations:    Attends Archivist Meetings:    Marital Status:   Intimate Partner Violence:    Fear of Current or Ex-Partner:    Emotionally Abused:    Physically Abused:    Sexually Abused:     Physical Exam Vitals reviewed.  Constitutional:      Appearance: He is normal weight.  HENT:     Head: Normocephalic.     Nose: Nose normal.     Mouth/Throat:     Mouth: Mucous membranes are moist.     Pharynx: Oropharynx is clear.  Eyes:     Pupils: Pupils are equal, round, and reactive to light.  Cardiovascular:     Rate and Rhythm: Normal rate and regular rhythm.     Pulses: Normal pulses.     Heart sounds: Normal heart sounds.  Pulmonary:     Effort: Pulmonary effort is normal.     Breath sounds: Normal breath sounds.  Abdominal:     General: Abdomen is flat.     Palpations: Abdomen is soft.  Musculoskeletal:        General: Normal range of motion.     Cervical back: Normal range of motion.     Right lower leg: No edema.     Left lower leg: No edema.  Skin:    General: Skin is warm and dry.     Capillary Refill: Capillary refill takes less than 2 seconds.  Neurological:     General: No focal deficit present.     Mental Status: He is alert.  Psychiatric:        Mood and Affect: Mood normal.      Met with Trevor Williams in the parking lot of [employer] on [address] where he reports he is staying near [address] in a tent. Trevor Williams has no clothes except for what is on his body, he reports he has had one meal today and a few bottles of water. Trevor Williams reports he has been without his medications for one week. Dariush was made aware that I will be getting his medications refilled and will deliver them once they are filled. Legrand Como agreed with same. Vitals were  obtained and are as noted. Dainel reports no progress on attempting to seek housing. I encouraged Osamah as he seemed depressed today. I will see Konor once medications are ready for pick up and delivery. Public visit complete.     Future Appointments  Date Time Provider [place]  09/11/2019  1:40 PM Trevor Dresser, MD [facility] None  09/18/2019  9:30 AM Trevor Stain, MD [facility] None     ACTION: Home visit completed Next visit planned for one week

## 2019-09-01 ENCOUNTER — Ambulatory Visit: Payer: Medicaid Other | Admitting: Critical Care Medicine

## 2019-09-01 DIAGNOSIS — Z03818 Encounter for observation for suspected exposure to other biological agents ruled out: Secondary | ICD-10-CM | POA: Diagnosis not present

## 2019-09-02 ENCOUNTER — Telehealth (HOSPITAL_COMMUNITY): Payer: Self-pay

## 2019-09-02 NOTE — Telephone Encounter (Signed)
Attempted to reach patient to set up visit and deliver medications. No success. Will continue to follow.

## 2019-09-04 ENCOUNTER — Telehealth (HOSPITAL_COMMUNITY): Payer: Self-pay

## 2019-09-04 NOTE — Telephone Encounter (Signed)
No answer on attempt to reach Mr. Hoaglin for weekly visit and to deliver medications. Will continue to follow.

## 2019-09-09 ENCOUNTER — Telehealth (HOSPITAL_COMMUNITY): Payer: Self-pay

## 2019-09-09 NOTE — Telephone Encounter (Signed)
Attempted to reach out to Trevor Williams to set up visit and medication delivery without success. Trevor Williams's phone rang but no voicemail box set up to leave message. I will continue to reach out.

## 2019-09-10 ENCOUNTER — Telehealth (HOSPITAL_COMMUNITY): Payer: Self-pay | Admitting: Licensed Clinical Social Worker

## 2019-09-10 ENCOUNTER — Telehealth (HOSPITAL_COMMUNITY): Payer: Self-pay

## 2019-09-10 NOTE — Telephone Encounter (Signed)
CSW attempting to get a hold of pt to check in and help reconnect with [facility] paramedic to help pt get his medications.  Unable to reach or leave VM- texted pt as well to inform we are wanting to get a hold of him  Will continue to attempt contact  Jorge Ny, [place] Worker [facility] Desk#: [phone] Cell#: [phone]

## 2019-09-10 NOTE — Telephone Encounter (Signed)
Attempted to reach Mr. Yearwood in regards to appointment tomorrow with Dr. Aundra Dubin as well as scheduling visit with me to deliver medications. I will continue to attempt.

## 2019-09-11 ENCOUNTER — Encounter (HOSPITAL_COMMUNITY): Payer: Medicaid Other | Admitting: Cardiology

## 2019-09-12 DIAGNOSIS — Z0389 Encounter for observation for other suspected diseases and conditions ruled out: Secondary | ICD-10-CM | POA: Diagnosis not present

## 2019-09-12 DIAGNOSIS — Z3009 Encounter for other general counseling and advice on contraception: Secondary | ICD-10-CM | POA: Diagnosis not present

## 2019-09-12 DIAGNOSIS — Z1388 Encounter for screening for disorder due to exposure to contaminants: Secondary | ICD-10-CM | POA: Diagnosis not present

## 2019-09-15 ENCOUNTER — Telehealth (HOSPITAL_COMMUNITY): Payer: Self-pay

## 2019-09-15 NOTE — Telephone Encounter (Signed)
Attempted to call Mr. Trevor Williams in regards to weekly visits and delivering medications. No answer. I have attempted multiple times over the last several weeks in an attempt to reach Mr. Trevor Williams without success. I will make HF clinic aware of same.

## 2019-09-16 DIAGNOSIS — Z03818 Encounter for observation for suspected exposure to other biological agents ruled out: Secondary | ICD-10-CM | POA: Diagnosis not present

## 2019-09-17 ENCOUNTER — Telehealth (HOSPITAL_COMMUNITY): Payer: Self-pay | Admitting: Licensed Clinical Social Worker

## 2019-09-17 NOTE — Telephone Encounter (Signed)
CSW attempted to call pt to check in.  Unable to reach and unable to leave VM  CSW will continue to follow and attempt contact- paramedic has also been trying to reach pt to get him his medications without success  Jorge Ny, [facility] Desk#: [phone] Cell#: [phone]

## 2019-09-18 ENCOUNTER — Ambulatory Visit: Payer: Medicaid Other | Admitting: Critical Care Medicine
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
## 2019-09-23 ENCOUNTER — Telehealth (HOSPITAL_COMMUNITY): Payer: Self-pay

## 2019-09-23 NOTE — Telephone Encounter (Signed)
Attempted to reach Trevor Williams in regards to medications and visit with no success. Will possible consider discharge with multiple failed attempts with contact.

## 2019-09-25 ENCOUNTER — Telehealth (HOSPITAL_COMMUNITY): Payer: Self-pay | Admitting: Licensed Clinical Social Worker

## 2019-09-25 NOTE — Telephone Encounter (Signed)
CSW attempted to reach out to pt to check in- unable to reach and unable to leave voicemail.  CSW will DC pt from [employer] program at this time due to lack of communication.  If pt reaches back out we can reconsider enrollment.  Jorge Ny, LCSW Clinical Social Worker [facility] Desk#: [phone] Cell#: [phone]

## 2019-10-06 ENCOUNTER — Telehealth (HOSPITAL_COMMUNITY): Payer: Self-pay | Admitting: Licensed Clinical Social Worker

## 2019-10-06 NOTE — Telephone Encounter (Signed)
CSW attempted to call pt but went straight to VM which is not set up.  Pt phone plan was paid for last by CSW and will run out on 7/22 so wanted to try to [place] contact.  Will continue to reach out  Jorge Ny, [facility] Desk#: [phone] Cell#: [phone]

## 2019-10-14 ENCOUNTER — Inpatient Hospital Stay (HOSPITAL_COMMUNITY)
Admission: EM | Admit: 2019-10-14 | Discharge: 2019-10-24 | DRG: 312 | Disposition: A | Discharge status: Home / Self Care | Payer: Medicaid Other | Attending: Internal Medicine | Admitting: Internal Medicine

## 2019-10-14 ENCOUNTER — Emergency Department (HOSPITAL_COMMUNITY): Payer: Medicaid Other

## 2019-10-14 ENCOUNTER — Other Ambulatory Visit: Payer: Self-pay

## 2019-10-14 ENCOUNTER — Encounter (HOSPITAL_COMMUNITY): Payer: Self-pay | Admitting: Obstetrics and Gynecology

## 2019-10-14 DIAGNOSIS — F17200 Nicotine dependence, unspecified, uncomplicated: Secondary | ICD-10-CM | POA: Diagnosis present

## 2019-10-14 DIAGNOSIS — Z881 Allergy status to other antibiotic agents status: Secondary | ICD-10-CM

## 2019-10-14 DIAGNOSIS — Z5900 Homelessness unspecified: Secondary | ICD-10-CM

## 2019-10-14 DIAGNOSIS — N179 Acute kidney failure, unspecified: Secondary | ICD-10-CM

## 2019-10-14 DIAGNOSIS — Z85828 Personal history of other malignant neoplasm of skin: Secondary | ICD-10-CM

## 2019-10-14 DIAGNOSIS — I5082 Biventricular heart failure: Secondary | ICD-10-CM | POA: Diagnosis present

## 2019-10-14 DIAGNOSIS — Z789 Other specified health status: Secondary | ICD-10-CM

## 2019-10-14 DIAGNOSIS — Z716 Tobacco abuse counseling: Secondary | ICD-10-CM

## 2019-10-14 DIAGNOSIS — R509 Fever, unspecified: Secondary | ICD-10-CM

## 2019-10-14 DIAGNOSIS — I081 Rheumatic disorders of both mitral and tricuspid valves: Secondary | ICD-10-CM | POA: Diagnosis present

## 2019-10-14 DIAGNOSIS — E860 Dehydration: Secondary | ICD-10-CM | POA: Diagnosis present

## 2019-10-14 DIAGNOSIS — I428 Other cardiomyopathies: Secondary | ICD-10-CM | POA: Diagnosis present

## 2019-10-14 DIAGNOSIS — G9389 Other specified disorders of brain: Secondary | ICD-10-CM | POA: Diagnosis present

## 2019-10-14 DIAGNOSIS — Z9114 Patient's other noncompliance with medication regimen: Secondary | ICD-10-CM

## 2019-10-14 DIAGNOSIS — Z8673 Personal history of transient ischemic attack (TIA), and cerebral infarction without residual deficits: Secondary | ICD-10-CM

## 2019-10-14 DIAGNOSIS — D509 Iron deficiency anemia, unspecified: Secondary | ICD-10-CM | POA: Diagnosis present

## 2019-10-14 DIAGNOSIS — R42 Dizziness and giddiness: Secondary | ICD-10-CM

## 2019-10-14 DIAGNOSIS — I5022 Chronic systolic (congestive) heart failure: Secondary | ICD-10-CM | POA: Diagnosis present

## 2019-10-14 DIAGNOSIS — F1010 Alcohol abuse, uncomplicated: Secondary | ICD-10-CM | POA: Diagnosis present

## 2019-10-14 DIAGNOSIS — D6959 Other secondary thrombocytopenia: Secondary | ICD-10-CM | POA: Diagnosis present

## 2019-10-14 DIAGNOSIS — R809 Proteinuria, unspecified: Secondary | ICD-10-CM | POA: Diagnosis present

## 2019-10-14 DIAGNOSIS — Z209 Contact with and (suspected) exposure to unspecified communicable disease: Secondary | ICD-10-CM | POA: Diagnosis not present

## 2019-10-14 DIAGNOSIS — R197 Diarrhea, unspecified: Secondary | ICD-10-CM | POA: Diagnosis not present

## 2019-10-14 DIAGNOSIS — R000 Tachycardia, unspecified: Secondary | ICD-10-CM | POA: Diagnosis not present

## 2019-10-14 DIAGNOSIS — I959 Hypotension, unspecified: Secondary | ICD-10-CM | POA: Diagnosis not present

## 2019-10-14 DIAGNOSIS — R58 Hemorrhage, not elsewhere classified: Secondary | ICD-10-CM | POA: Diagnosis not present

## 2019-10-14 DIAGNOSIS — Z20822 Contact with and (suspected) exposure to covid-19: Secondary | ICD-10-CM | POA: Diagnosis present

## 2019-10-14 DIAGNOSIS — J449 Chronic obstructive pulmonary disease, unspecified: Secondary | ICD-10-CM | POA: Diagnosis present

## 2019-10-14 DIAGNOSIS — F1090 Alcohol use, unspecified, uncomplicated: Secondary | ICD-10-CM

## 2019-10-14 DIAGNOSIS — I951 Orthostatic hypotension: Principal | ICD-10-CM | POA: Diagnosis present

## 2019-10-14 DIAGNOSIS — R531 Weakness: Secondary | ICD-10-CM

## 2019-10-14 DIAGNOSIS — K219 Gastro-esophageal reflux disease without esophagitis: Secondary | ICD-10-CM | POA: Diagnosis present

## 2019-10-14 DIAGNOSIS — Z8249 Family history of ischemic heart disease and other diseases of the circulatory system: Secondary | ICD-10-CM

## 2019-10-14 DIAGNOSIS — D539 Nutritional anemia, unspecified: Secondary | ICD-10-CM | POA: Diagnosis present

## 2019-10-14 DIAGNOSIS — J439 Emphysema, unspecified: Secondary | ICD-10-CM | POA: Diagnosis present

## 2019-10-14 DIAGNOSIS — I2510 Atherosclerotic heart disease of native coronary artery without angina pectoris: Secondary | ICD-10-CM | POA: Diagnosis present

## 2019-10-14 DIAGNOSIS — B354 Tinea corporis: Secondary | ICD-10-CM | POA: Diagnosis present

## 2019-10-14 DIAGNOSIS — F17210 Nicotine dependence, cigarettes, uncomplicated: Secondary | ICD-10-CM | POA: Diagnosis present

## 2019-10-14 DIAGNOSIS — K760 Fatty (change of) liver, not elsewhere classified: Secondary | ICD-10-CM | POA: Diagnosis present

## 2019-10-14 DIAGNOSIS — Z888 Allergy status to other drugs, medicaments and biological substances status: Secondary | ICD-10-CM

## 2019-10-14 DIAGNOSIS — E876 Hypokalemia: Secondary | ICD-10-CM | POA: Diagnosis present

## 2019-10-14 DIAGNOSIS — R8281 Pyuria: Secondary | ICD-10-CM | POA: Diagnosis present

## 2019-10-14 DIAGNOSIS — R7989 Other specified abnormal findings of blood chemistry: Secondary | ICD-10-CM | POA: Diagnosis present

## 2019-10-14 DIAGNOSIS — K8020 Calculus of gallbladder without cholecystitis without obstruction: Secondary | ICD-10-CM | POA: Diagnosis present

## 2019-10-14 DIAGNOSIS — R7881 Bacteremia: Secondary | ICD-10-CM | POA: Diagnosis present

## 2019-10-14 LAB — COMPREHENSIVE METABOLIC PANEL
ALT: 38 U/L (ref 0–44)
AST: 79 U/L — ABNORMAL HIGH (ref 15–41)
Albumin: 3.9 g/dL (ref 3.5–5.0)
Alkaline Phosphatase: 115 U/L (ref 38–126)
Anion gap: 17 — ABNORMAL HIGH (ref 5–15)
BUN: 27 mg/dL — ABNORMAL HIGH (ref 6–20)
CO2: 23 mmol/L (ref 22–32)
Calcium: 8.8 mg/dL — ABNORMAL LOW (ref 8.9–10.3)
Chloride: 96 mmol/L — ABNORMAL LOW (ref 98–111)
Creatinine, Ser: 1.54 mg/dL — ABNORMAL HIGH (ref 0.61–1.24)
GFR calc Af Amer: 57 mL/min — ABNORMAL LOW (ref >60)
GFR calc non Af Amer: 49 mL/min — ABNORMAL LOW (ref >60)
Glucose, Bld: 93 mg/dL (ref 70–99)
Potassium: 2.7 mmol/L — CL (ref 3.5–5.1)
Sodium: 136 mmol/L (ref 135–145)
Total Bilirubin: 2.2 mg/dL — ABNORMAL HIGH (ref 0.3–1.2)
Total Protein: 7.7 g/dL (ref 6.5–8.1)

## 2019-10-14 LAB — CBC
HCT: 33.6 % — ABNORMAL LOW (ref 39.0–52.0)
Hemoglobin: 11.2 g/dL — ABNORMAL LOW (ref 13.0–17.0)
MCH: 38.1 pg — ABNORMAL HIGH (ref 26.0–34.0)
MCHC: 33.3 g/dL (ref 30.0–36.0)
MCV: 114.3 fL — ABNORMAL HIGH (ref 80.0–100.0)
Platelets: 64 10*3/uL — ABNORMAL LOW (ref 150–400)
RBC: 2.94 MIL/uL — ABNORMAL LOW (ref 4.22–5.81)
RDW: 12.9 % (ref 11.5–15.5)
WBC: 7.4 10*3/uL (ref 4.0–10.5)
nRBC: 0 % (ref 0.0–0.2)

## 2019-10-14 LAB — URINALYSIS, ROUTINE W REFLEX MICROSCOPIC
Glucose, UA: NEGATIVE mg/dL
Ketones, ur: 15 mg/dL — AB
Leukocytes,Ua: NEGATIVE
Nitrite: POSITIVE — AB
Protein, ur: 100 mg/dL — AB
Specific Gravity, Urine: >1.03 — ABNORMAL HIGH (ref 1.005–1.030)
pH: 5 (ref 5.0–8.0)

## 2019-10-14 LAB — URINALYSIS, MICROSCOPIC (REFLEX)

## 2019-10-14 LAB — ETHANOL: Alcohol, Ethyl (B): <10 mg/dL (ref <10)

## 2019-10-14 LAB — POC OCCULT BLOOD, ED: Fecal Occult Bld: NEGATIVE

## 2019-10-14 LAB — LIPASE, BLOOD: Lipase: 53 U/L — ABNORMAL HIGH (ref 11–51)

## 2019-10-14 LAB — MAGNESIUM: Magnesium: 1.2 mg/dL — ABNORMAL LOW (ref 1.7–2.4)

## 2019-10-14 MED ORDER — LORAZEPAM 2 MG/ML IJ SOLN
1.0000 mg | Freq: Once | INTRAMUSCULAR | Status: AC
  Administered 2019-10-14: 1 mg via INTRAVENOUS
  Filled 2019-10-14: qty 1

## 2019-10-14 MED ORDER — ONDANSETRON HCL 4 MG/2ML IJ SOLN
4.0000 mg | Freq: Once | INTRAMUSCULAR | Status: AC
  Administered 2019-10-14: 4 mg via INTRAVENOUS
  Filled 2019-10-14: qty 2

## 2019-10-14 MED ORDER — POTASSIUM CHLORIDE CRYS ER 20 MEQ PO TBCR
40.0000 meq | EXTENDED_RELEASE_TABLET | Freq: Once | ORAL | Status: AC
  Administered 2019-10-14: 40 meq via ORAL
  Filled 2019-10-14: qty 2

## 2019-10-14 MED ORDER — SODIUM CHLORIDE 0.9 % IV BOLUS
1000.0000 mL | Freq: Once | INTRAVENOUS | Status: AC
  Administered 2019-10-15: 1000 mL via INTRAVENOUS

## 2019-10-14 MED ORDER — SODIUM CHLORIDE 0.9 % IV BOLUS
1000.0000 mL | Freq: Once | INTRAVENOUS | Status: AC
  Administered 2019-10-14: 1000 mL via INTRAVENOUS

## 2019-10-14 MED ORDER — MAGNESIUM SULFATE 2 GM/50ML IV SOLN
2.0000 g | Freq: Once | INTRAVENOUS | Status: AC
  Administered 2019-10-14: 2 g via INTRAVENOUS
  Filled 2019-10-14: qty 50

## 2019-10-14 MED ORDER — POTASSIUM CHLORIDE 10 MEQ/100ML IV SOLN
10.0000 meq | INTRAVENOUS | Status: AC
  Administered 2019-10-14 – 2019-10-15 (×3): 10 meq via INTRAVENOUS
  Filled 2019-10-14 (×3): qty 100

## 2019-10-14 MED ORDER — MECLIZINE HCL 25 MG PO TABS
25.0000 mg | ORAL_TABLET | Freq: Once | ORAL | Status: AC
  Administered 2019-10-14: 25 mg via ORAL
  Filled 2019-10-14: qty 1

## 2019-10-14 MED ORDER — SODIUM CHLORIDE 0.9% FLUSH
3.0000 mL | Freq: Once | INTRAVENOUS | Status: AC
  Administered 2019-10-14: 3 mL via INTRAVENOUS

## 2019-10-14 NOTE — ED Provider Notes (Signed)
[facility] DEPT Provider Note   CSN: [id number] Arrival date & time: 10/14/19  1503     History Chief Complaint  Patient presents with  . Diarrhea    Trevor Williams is a 59 y.o. male.  HPI     59 year old male with history of nonischemic cardiomyopathy, alcohol abuse, abnormal liver function tests, presents with concern for dizziness for 3 days in setting of one month of nausea, vomiting, diarrhea, sweats and chills.  Reports suddenly 3 days ago developed difficulty walking and room spinning dizziness. Dizziness worse with movements, better with laying still.  Difficulty walking, reports legs shaking.  Reports drinks etoh but no hx of withdrawal.  Nausea, vomiting for one month, worsened recently. Chronic cough unchanged. Dyspnea on exertion present for one month at least. [place] not take medications[place] No known tick exposures or sick contacts.   No medications/denies taking medications that are not his. Denies drug use.    Past Medical History:  Diagnosis Date  . Abnormal liver function tests   . Acute respiratory failure ([place])    a. 07/2018 requiring intubation - CAP/CHF.  Marland Kitchen Alcohol abuse   . Biventricular heart failure ([place])   . Cancer ([place])    skin (left hand)  . Delirium    a. h/o delirium while admitted  . Emphysema of lung ([place])   . Homelessness   . Macrocytic anemia   . Mild CAD    a. cath 07/2018 60% ostial ramus otherwise OK.  . Mitral regurgitation   . NICM (nonischemic cardiomyopathy) ([place])   . Protein calorie malnutrition ([place])   . Tricuspid regurgitation     Patient Active Problem List   Diagnosis Date Noted  . Dehydration 10/15/2019  . Closed nondisplaced intertrochanteric fracture of right femur ([place])   . Alcoholic intoxication without complication ([place]) 38/18/2993  . Chronic systolic heart failure ([place]) 03/03/2019  . Elevated LFTs 03/03/2019  . Left lateral knee pain 03/03/2019  . Iron deficiency  anemia due to chronic blood loss 03/03/2019  . Hypomagnesemia 03/03/2019  . Alcohol use 01/26/2019  . Homeless 01/26/2019  . Hypokalemia 01/26/2019  . Simple chronic bronchitis ([place]) 01/25/2015  . Tobacco use disorder 01/25/2015  . Chronic midline low back pain with sciatica 12/25/2014    Past Surgical History:  Procedure Laterality Date  . BIOPSY  01/28/2019   Procedure: BIOPSY;  Surgeon: Otis Brace, MD;  Location: [facility];  Service: Gastroenterology;;  . BIOPSY  01/30/2019   Procedure: BIOPSY;  Surgeon: Otis Brace, MD;  Location: [facility];  Service: Gastroenterology;;  . COLONOSCOPY WITH PROPOFOL N/A 01/30/2019   Procedure: COLONOSCOPY WITH PROPOFOL;  Surgeon: Otis Brace, MD;  Location: [facility];  Service: Gastroenterology;  Laterality: N/A;  . ESOPHAGOGASTRODUODENOSCOPY (EGD) WITH PROPOFOL N/A 01/28/2019   Procedure: ESOPHAGOGASTRODUODENOSCOPY (EGD) WITH PROPOFOL;  Surgeon: Otis Brace, MD;  Location: [facility];  Service: Gastroenterology;  Laterality: N/A;  . FEMUR IM NAIL Right 04/30/2019   Procedure: INTRAMEDULLARY (IM) NAIL FEMORAL;  Surgeon: Paralee Cancel, MD;  Location: [facility];  Service: Orthopedics;  Laterality: Right;  . HEMOSTASIS CLIP PLACEMENT  01/30/2019   Procedure: HEMOSTASIS CLIP PLACEMENT;  Surgeon: Otis Brace, MD;  Location: [facility];  Service: Gastroenterology;;  . POLYPECTOMY  01/30/2019   Procedure: POLYPECTOMY;  Surgeon: Otis Brace, MD;  Location: [facility];  Service: Gastroenterology;;  . RIGHT/LEFT HEART CATH AND CORONARY ANGIOGRAPHY N/A 08/09/2018   Procedure: RIGHT/LEFT HEART CATH AND CORONARY ANGIOGRAPHY;  Surgeon: Larey Dresser,  MD;  Location: [facility];  Service: Cardiovascular;  Laterality: N/A;       Family History  Problem Relation Age of Onset  . Hypertension Maternal Grandfather     Social History   Tobacco Use  . Smoking status: Current Every Day Smoker    Packs/day:  1.00    Types: Cigarettes  . Smokeless tobacco: Current User  Vaping Use  . Vaping Use: Never used  Substance Use Topics  . Alcohol use: Yes    Comment: Liquor daily - 1/2 of a fifth   . Drug use: No    Home Medications Prior to Admission medications   Medication Sig Start Date End Date Taking? Authorizing Provider  meloxicam (MOBIC) 7.5 MG tablet TAKE 1 TABLET (7.5 MG TOTAL) BY MOUTH DAILY. Patient not taking: Reported on 07/31/2019 07/25/19   Elsie Stain, MD  albuterol (VENTOLIN HFA) 108 (90 Base) MCG/ACT inhaler Inhale 2 puffs into the lungs every 4 (four) hours as needed for wheezing or shortness of breath. Patient not taking: Reported on 07/31/2019 03/20/19   Elsie Stain, MD  carvedilol (COREG) 3.125 MG tablet Take 1 tablet (3.125 mg total) by mouth 2 (two) times daily with a meal. Patient not taking: Reported on 07/31/2019 06/25/19   Elsie Stain, MD  folic acid (FOLVITE) 1 MG tablet Take 1 tablet (1 mg total) by mouth daily. Patient not taking: Reported on 08/21/2019 06/25/19   Elsie Stain, MD  gabapentin (NEURONTIN) 300 MG capsule Take 1 capsule (300 mg total) by mouth 3 (three) times daily. Patient not taking: Reported on 07/31/2019 06/25/19   Elsie Stain, MD  HYDROcodone-acetaminophen (NORCO/VICODIN) 5-325 MG tablet Take 1-2 tablets by mouth every 6 (six) hours as needed for moderate pain (pain score 4-6). Patient not taking: Reported on 07/24/2019 05/01/19   Maurice March, PA-C  pantoprazole (PROTONIX) 40 MG tablet Take 1 tablet (40 mg total) by mouth daily. Patient not taking: Reported on 08/21/2019 06/25/19   Elsie Stain, MD  rosuvastatin (CRESTOR) 10 MG tablet Take 1 tablet (10 mg total) by mouth daily at 6 PM. Patient not taking: Reported on 07/31/2019 06/25/19   Elsie Stain, MD  sacubitril-valsartan (ENTRESTO) 24-26 MG Take 1 tablet by mouth 2 (two) times daily. Patient not taking: Reported on 07/31/2019 06/25/19   Elsie Stain, MD  thiamine 100  MG tablet Take 1 tablet (100 mg total) by mouth daily. Patient not taking: Reported on 08/21/2019 03/20/19   Elsie Stain, MD    Allergies    Spironolactone, Doxycycline, Ibuprofen, Rocephin [ceftriaxone], Tylenol [acetaminophen], and Vancomycin  Review of Systems   Review of Systems  Constitutional: Positive for fatigue. Negative for fever.  HENT: Negative for sore throat.   Eyes: Negative for visual disturbance.  Respiratory: Positive for shortness of breath (chronic unchanged). Negative for cough.   Cardiovascular: Negative for chest pain.  Gastrointestinal: Positive for diarrhea, nausea and vomiting. Negative for abdominal pain.  Genitourinary: Negative for difficulty urinating.  Musculoskeletal: Negative for back pain and neck stiffness.  Skin: Negative for rash.  Neurological: Positive for dizziness and light-headedness. Negative for syncope, weakness, numbness (tingling bilateral hands) and headaches.    Physical Exam Updated Vital Signs BP 123/79   Pulse 47   Temp 98 F (36.7 C) (Oral)   Resp 17   SpO2 100%   Physical Exam Vitals and nursing note reviewed.  Constitutional:      General: He is not in acute  distress.    Appearance: He is well-developed. He is cachectic. He is not diaphoretic.  HENT:     Head: Normocephalic and atraumatic.  Eyes:     General: No visual field deficit.    Conjunctiva/sclera: Conjunctivae normal.  Cardiovascular:     Rate and Rhythm: Regular rhythm. Tachycardia present.     Heart sounds: Normal heart sounds. No murmur heard.  No friction rub. No gallop.   Pulmonary:     Effort: Pulmonary effort is normal. No respiratory distress.     Breath sounds: Normal breath sounds. No wheezing or rales.  Abdominal:     General: There is no distension.     Palpations: Abdomen is soft.     Tenderness: There is no abdominal tenderness. There is no guarding.  Musculoskeletal:     Cervical back: Normal range of motion.  Skin:    General:  Skin is warm and dry.  Neurological:     Mental Status: He is alert and oriented to person, place, and time.     Cranial Nerves: No cranial nerve deficit or facial asymmetry.     Sensory: Sensation is intact. No sensory deficit.     Motor: Tremor present. No weakness.     Gait: Gait abnormal (ataxia).     ED Results / Procedures / Treatments   Labs (all labs ordered are listed, but only abnormal results are displayed) Labs Reviewed  LIPASE, BLOOD - Abnormal; Notable for the following components:      Result Value   Lipase 53 (*)    All other components within normal limits  COMPREHENSIVE METABOLIC PANEL - Abnormal; Notable for the following components:   Potassium 2.7 (*)    Chloride 96 (*)    BUN 27 (*)    Creatinine, Ser 1.54 (*)    Calcium 8.8 (*)    AST 79 (*)    Total Bilirubin 2.2 (*)    GFR calc non Af Amer 49 (*)    GFR calc Af Amer 57 (*)    Anion gap 17 (*)    All other components within normal limits  CBC - Abnormal; Notable for the following components:   RBC 2.94 (*)    Hemoglobin 11.2 (*)    HCT 33.6 (*)    MCV 114.3 (*)    MCH 38.1 (*)    Platelets 64 (*)    All other components within normal limits  URINALYSIS, ROUTINE W REFLEX MICROSCOPIC - Abnormal; Notable for the following components:   Color, Urine ORANGE (*)    APPearance HAZY (*)    Specific Gravity, Urine >1.030 (*)    Hgb urine dipstick TRACE (*)    Bilirubin Urine LARGE (*)    Ketones, ur 15 (*)    Protein, ur 100 (*)    Nitrite POSITIVE (*)    All other components within normal limits  MAGNESIUM - Abnormal; Notable for the following components:   Magnesium 1.2 (*)    All other components within normal limits  URINALYSIS, MICROSCOPIC (REFLEX) - Abnormal; Notable for the following components:   Bacteria, UA RARE (*)    All other components within normal limits  SARS CORONAVIRUS 2 BY RT PCR (HOSPITAL ORDER, [facility])  CULTURE, BLOOD (ROUTINE X 2)  CULTURE,  BLOOD (ROUTINE X 2)  ETHANOL  LACTIC ACID, PLASMA  LACTIC ACID, PLASMA  PROTIME-INR  RAPID URINE DRUG SCREEN, HOSP PERFORMED  CK  BETA-HYDROXYBUTYRIC ACID  VITAMIN B12  FOLATE  IRON AND TIBC  FERRITIN  RETICULOCYTES  HEPATITIS PANEL, ACUTE  SODIUM, URINE, RANDOM  CREATININE, URINE, RANDOM  BRAIN NATRIURETIC PEPTIDE  PHOSPHORUS  BASIC METABOLIC PANEL  POC OCCULT BLOOD, ED  TROPONIN I (HIGH SENSITIVITY)    EKG EKG Interpretation  Date/Time:  Tuesday October 14 2019 23:50:50 EDT Ventricular Rate:  99 PR Interval:    QRS Duration: 61 QT Interval:  403 QTC Calculation: 518 R Axis:   74 Text Interpretation: Sinus tachycardia Multiple premature complexes, vent & supraven Borderline T abnormalities, inferior leads Prolonged QT interval Since prior ECG< rhythm more clearly sinus Confirmed by Gareth Morgan [phone]) on 10/14/2019 11:57:16 PM   Radiology CT Head Wo Contrast  Result Date: 10/14/2019 CLINICAL DATA:  59 year old male with vertigo. EXAM: CT HEAD WITHOUT CONTRAST TECHNIQUE: Contiguous axial images were obtained from the base of the skull through the vertex without intravenous contrast. COMPARISON:  Head CT dated 04/28/2019. FINDINGS: Brain: There is mild age-related atrophy and chronic microvascular ischemic changes. 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 There is no acute intracranial hemorrhage. No mass effect or midline shift. No extra-axial fluid collection. Vascular: No hyperdense vessel or unexpected calcification. Skull: Normal. Negative for fracture or focal lesion. Sinuses/Orbits: No acute finding. Other: None IMPRESSION: 1. No acute intracranial pathology. 2. Age-related atrophy and chronic microvascular ischemic changes. 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 Electronically Signed   By: Anner Crete M.D.   On: 10/14/2019 22:30   MR BRAIN WO CONTRAST  Result Date: 10/14/2019 CLINICAL  DATA:  59 year old male with vertigo. EXAM: MRI HEAD WITHOUT CONTRAST TECHNIQUE: Multiplanar, multiecho pulse sequences of the brain and surrounding structures were obtained without intravenous contrast. COMPARISON:  Head and cervical spine CT 04/28/2019. FINDINGS: Brain: No restricted diffusion or evidence of acute infarction. Scattered chronic encephalomalacia in the right MCA territory, most pronounced in the right inferior frontal gyrus and superior temporal gyrus. Stable cerebral volume since February. Stable mild ex vacuo ventricular enlargement. No midline shift, mass effect, evidence of mass lesion, ventriculomegaly, extra-axial collection or acute intracranial hemorrhage. Cervicomedullary junction and pituitary are within normal limits. There is also a small area of cortical encephalomalacia in the left superior temporal gyrus. No definite chronic cerebral blood products. Deep gray nuclei, brainstem and cerebellum remain within normal limits. Vascular: Major intracranial vascular flow voids are preserved. Skull and upper cervical spine: Negative, Visualized bone marrow signal is within normal limits. Sinuses/Orbits: Negative orbits. Paranasal sinuses and mastoids are stable and well pneumatized. Other: Grossly normal visible internal auditory structures. Normal stylomastoid foramina. There is a chronic cyst along the inferior margin of the right pinna (series 10, image 3), stable. IMPRESSION: 1. No acute intracranial abnormality. 2. Chronic frontotemporal cortex encephalomalacia, most pronounced in the right MCA territory, could be post ischemic and/or posttraumatic. Electronically Signed   By: Genevie Ann M.D.   On: 10/14/2019 22:11    Procedures Procedures (including critical care time)  Medications Ordered in ED Medications  potassium chloride 10 mEq in 100 mL IVPB (10 mEq Intravenous New Bag/Given 10/15/19 0107)  potassium chloride 10 mEq in 100 mL IVPB (has no administration in time range)    thiamine (B-1) injection 100 mg (has no administration in time range)  sodium chloride flush (NS) 0.9 % injection 3 mL (3 mLs Intravenous Given 10/14/19 2107)  potassium chloride SA (KLOR-CON) CR tablet 40 mEq (40 mEq Oral Given 10/14/19 1725)  sodium chloride 0.9 % bolus 1,000 mL (0  mLs Intravenous Stopped 10/15/19 0039)  ondansetron (ZOFRAN) injection 4 mg (4 mg Intravenous Given 10/14/19 2102)  meclizine (ANTIVERT) tablet 25 mg (25 mg Oral Given 10/14/19 2103)  LORazepam (ATIVAN) injection 1 mg (1 mg Intravenous Given 10/14/19 2104)  magnesium sulfate IVPB 2 g 50 mL (0 g Intravenous Stopped 10/14/19 2355)  sodium chloride 0.9 % bolus 1,000 mL (1,000 mLs Intravenous New Bag/Given 10/15/19 0038)    ED Course  I have reviewed the triage vital signs and the nursing notes.  Pertinent labs & imaging results that were available during my care of the patient were reviewed by me and considered in my medical decision making (see chart for details).    MDM Rules/Calculators/A&P                          59 year old male with history of nonischemic cardiomyopathy, alcohol abuse, abnormal liver function tests, presents with concern for dizziness for 3 days in setting of one month of nausea, vomiting, diarrhea, sweats and chills.  DDx on arrival includes CVA, dehydration, withdrawal, infection.  Given acute onset dizziness and difficulty walking with ataxia on exam, ordered MR and CT head which show no evidence of acute CVA.  Given ativan for tremors, concern for withdrawal and to help with anxiety from MR. BP low following MRI, however HR continues to remain increased.  Rectal exam shows no melena or blood, doubt GI bleed. Doubt PE given no acute dyspnea nor chest pain.  No sign of bacterial infection, blood cx ordered.   Given IV fluids with mild improvement, however persistent tachycardia.  Will admit for continued care in setting of hypokalemia, generalized weakness, tachycardia.     Final  Clinical Impression(s) / ED Diagnoses Final diagnoses:  Generalized weakness  Dizziness  Dehydration    Rx / DC Orders ED Discharge Orders    None    j   Gareth Morgan, MD 10/15/19 [phone]

## 2019-10-14 NOTE — ED Notes (Signed)
Date and time results received: 10/14/19 1720 (use smartphrase ".now" to insert current time)  Test: BMET Critical Value: potassium 2.7  Name of Provider Notified: Orlie Pollen, RN; Darl Householder, MD  Orders Received? Or Actions Taken?: No MD orders at this time

## 2019-10-14 NOTE — ED Notes (Signed)
Pt. Made aware for the need of urine specimen. 

## 2019-10-14 NOTE — ED Triage Notes (Signed)
Patient reports to the ER from the side of the road. Patient reportedly drinks a pint of beer a day. Patient reports he has been having chills with cold sweats and diarrhea.

## 2019-10-14 NOTE — ED Notes (Signed)
Patient transported to MRI 

## 2019-10-15 ENCOUNTER — Observation Stay (HOSPITAL_COMMUNITY): Payer: Medicaid Other

## 2019-10-15 ENCOUNTER — Encounter (HOSPITAL_COMMUNITY): Payer: Self-pay | Admitting: Internal Medicine

## 2019-10-15 ENCOUNTER — Other Ambulatory Visit: Payer: Self-pay

## 2019-10-15 ENCOUNTER — Inpatient Hospital Stay (HOSPITAL_COMMUNITY): Payer: Medicaid Other

## 2019-10-15 DIAGNOSIS — R42 Dizziness and giddiness: Secondary | ICD-10-CM

## 2019-10-15 DIAGNOSIS — K8020 Calculus of gallbladder without cholecystitis without obstruction: Secondary | ICD-10-CM | POA: Diagnosis present

## 2019-10-15 DIAGNOSIS — Z590 Homelessness: Secondary | ICD-10-CM | POA: Diagnosis not present

## 2019-10-15 DIAGNOSIS — I5022 Chronic systolic (congestive) heart failure: Secondary | ICD-10-CM | POA: Diagnosis present

## 2019-10-15 DIAGNOSIS — Z8673 Personal history of transient ischemic attack (TIA), and cerebral infarction without residual deficits: Secondary | ICD-10-CM | POA: Diagnosis not present

## 2019-10-15 DIAGNOSIS — B354 Tinea corporis: Secondary | ICD-10-CM | POA: Diagnosis present

## 2019-10-15 DIAGNOSIS — Z20822 Contact with and (suspected) exposure to covid-19: Secondary | ICD-10-CM | POA: Diagnosis present

## 2019-10-15 DIAGNOSIS — D509 Iron deficiency anemia, unspecified: Secondary | ICD-10-CM | POA: Diagnosis present

## 2019-10-15 DIAGNOSIS — Z7289 Other problems related to lifestyle: Secondary | ICD-10-CM | POA: Diagnosis not present

## 2019-10-15 DIAGNOSIS — I361 Nonrheumatic tricuspid (valve) insufficiency: Secondary | ICD-10-CM

## 2019-10-15 DIAGNOSIS — D539 Nutritional anemia, unspecified: Secondary | ICD-10-CM | POA: Diagnosis not present

## 2019-10-15 DIAGNOSIS — Z888 Allergy status to other drugs, medicaments and biological substances status: Secondary | ICD-10-CM | POA: Diagnosis not present

## 2019-10-15 DIAGNOSIS — R000 Tachycardia, unspecified: Secondary | ICD-10-CM | POA: Diagnosis not present

## 2019-10-15 DIAGNOSIS — I951 Orthostatic hypotension: Secondary | ICD-10-CM | POA: Diagnosis present

## 2019-10-15 DIAGNOSIS — F1010 Alcohol abuse, uncomplicated: Secondary | ICD-10-CM | POA: Diagnosis present

## 2019-10-15 DIAGNOSIS — B9562 Methicillin resistant Staphylococcus aureus infection as the cause of diseases classified elsewhere: Secondary | ICD-10-CM | POA: Diagnosis not present

## 2019-10-15 DIAGNOSIS — F17210 Nicotine dependence, cigarettes, uncomplicated: Secondary | ICD-10-CM | POA: Diagnosis present

## 2019-10-15 DIAGNOSIS — R8281 Pyuria: Secondary | ICD-10-CM | POA: Diagnosis present

## 2019-10-15 DIAGNOSIS — I2510 Atherosclerotic heart disease of native coronary artery without angina pectoris: Secondary | ICD-10-CM | POA: Diagnosis present

## 2019-10-15 DIAGNOSIS — R531 Weakness: Secondary | ICD-10-CM

## 2019-10-15 DIAGNOSIS — Z881 Allergy status to other antibiotic agents status: Secondary | ICD-10-CM | POA: Diagnosis not present

## 2019-10-15 DIAGNOSIS — G9389 Other specified disorders of brain: Secondary | ICD-10-CM | POA: Diagnosis present

## 2019-10-15 DIAGNOSIS — Z716 Tobacco abuse counseling: Secondary | ICD-10-CM | POA: Diagnosis not present

## 2019-10-15 DIAGNOSIS — E860 Dehydration: Secondary | ICD-10-CM | POA: Diagnosis not present

## 2019-10-15 DIAGNOSIS — R7989 Other specified abnormal findings of blood chemistry: Secondary | ICD-10-CM | POA: Diagnosis not present

## 2019-10-15 DIAGNOSIS — F17200 Nicotine dependence, unspecified, uncomplicated: Secondary | ICD-10-CM

## 2019-10-15 DIAGNOSIS — E876 Hypokalemia: Secondary | ICD-10-CM

## 2019-10-15 DIAGNOSIS — K760 Fatty (change of) liver, not elsewhere classified: Secondary | ICD-10-CM | POA: Diagnosis present

## 2019-10-15 DIAGNOSIS — Z85828 Personal history of other malignant neoplasm of skin: Secondary | ICD-10-CM | POA: Diagnosis not present

## 2019-10-15 DIAGNOSIS — N179 Acute kidney failure, unspecified: Secondary | ICD-10-CM | POA: Diagnosis present

## 2019-10-15 DIAGNOSIS — Z9114 Patient's other noncompliance with medication regimen: Secondary | ICD-10-CM | POA: Diagnosis not present

## 2019-10-15 DIAGNOSIS — R7881 Bacteremia: Secondary | ICD-10-CM | POA: Diagnosis not present

## 2019-10-15 DIAGNOSIS — I428 Other cardiomyopathies: Secondary | ICD-10-CM | POA: Diagnosis present

## 2019-10-15 LAB — BLOOD CULTURE ID PANEL (REFLEXED)

## 2019-10-15 LAB — COMPREHENSIVE METABOLIC PANEL
ALT: 31 U/L (ref 0–44)
AST: 71 U/L — ABNORMAL HIGH (ref 15–41)
Albumin: 2.9 g/dL — ABNORMAL LOW (ref 3.5–5.0)
Alkaline Phosphatase: 94 U/L (ref 38–126)
Anion gap: 14 (ref 5–15)
BUN: 29 mg/dL — ABNORMAL HIGH (ref 6–20)
CO2: 20 mmol/L — ABNORMAL LOW (ref 22–32)
Calcium: 7.6 mg/dL — ABNORMAL LOW (ref 8.9–10.3)
Chloride: 103 mmol/L (ref 98–111)
Creatinine, Ser: 1.38 mg/dL — ABNORMAL HIGH (ref 0.61–1.24)
GFR calc Af Amer: >60 mL/min (ref >60)
GFR calc non Af Amer: 56 mL/min — ABNORMAL LOW (ref >60)
Glucose, Bld: 86 mg/dL (ref 70–99)
Potassium: 3.7 mmol/L (ref 3.5–5.1)
Sodium: 137 mmol/L (ref 135–145)
Total Bilirubin: 1.6 mg/dL — ABNORMAL HIGH (ref 0.3–1.2)
Total Protein: 5.8 g/dL — ABNORMAL LOW (ref 6.5–8.1)

## 2019-10-15 LAB — RETICULOCYTES
Immature Retic Fract: 10.5 % (ref 2.3–15.9)
RBC.: 2.82 MIL/uL — ABNORMAL LOW (ref 4.22–5.81)
Retic Count, Absolute: 29 10*3/uL (ref 19.0–186.0)
Retic Ct Pct: 1 % (ref 0.4–3.1)

## 2019-10-15 LAB — CBC WITH DIFFERENTIAL/PLATELET
Abs Immature Granulocytes: 0.02 10*3/uL (ref 0.00–0.07)
Basophils Absolute: 0 10*3/uL (ref 0.0–0.1)
Basophils Relative: 1 %
Eosinophils Absolute: 0.1 10*3/uL (ref 0.0–0.5)
Eosinophils Relative: 2 %
HCT: 29.6 % — ABNORMAL LOW (ref 39.0–52.0)
Hemoglobin: 9.6 g/dL — ABNORMAL LOW (ref 13.0–17.0)
Immature Granulocytes: 0 %
Lymphocytes Relative: 22 %
Lymphs Abs: 1.3 10*3/uL (ref 0.7–4.0)
MCH: 37.5 pg — ABNORMAL HIGH (ref 26.0–34.0)
MCHC: 32.4 g/dL (ref 30.0–36.0)
MCV: 115.6 fL — ABNORMAL HIGH (ref 80.0–100.0)
Monocytes Absolute: 0.5 10*3/uL (ref 0.1–1.0)
Monocytes Relative: 9 %
Neutro Abs: 3.9 10*3/uL (ref 1.7–7.7)
Neutrophils Relative %: 66 %
Platelets: 43 10*3/uL — ABNORMAL LOW (ref 150–400)
RBC: 2.56 MIL/uL — ABNORMAL LOW (ref 4.22–5.81)
RDW: 12.7 % (ref 11.5–15.5)
WBC: 5.9 10*3/uL (ref 4.0–10.5)
nRBC: 0 % (ref 0.0–0.2)

## 2019-10-15 LAB — IRON AND TIBC
Iron: 126 ug/dL (ref 45–182)
Saturation Ratios: 69 % — ABNORMAL HIGH (ref 17.9–39.5)
TIBC: 183 ug/dL — ABNORMAL LOW (ref 250–450)
UIBC: 57 ug/dL

## 2019-10-15 LAB — BASIC METABOLIC PANEL
Anion gap: 15 (ref 5–15)
Anion gap: 10 (ref 5–15)
BUN: 29 mg/dL — ABNORMAL HIGH (ref 6–20)
BUN: 19 mg/dL (ref 6–20)
CO2: 21 mmol/L — ABNORMAL LOW (ref 22–32)
CO2: 20 mmol/L — ABNORMAL LOW (ref 22–32)
Calcium: 7.9 mg/dL — ABNORMAL LOW (ref 8.9–10.3)
Calcium: 8.6 mg/dL — ABNORMAL LOW (ref 8.9–10.3)
Chloride: 101 mmol/L (ref 98–111)
Chloride: 105 mmol/L (ref 98–111)
Creatinine, Ser: 1.57 mg/dL — ABNORMAL HIGH (ref 0.61–1.24)
Creatinine, Ser: 0.85 mg/dL (ref 0.61–1.24)
GFR calc Af Amer: 55 mL/min — ABNORMAL LOW (ref >60)
GFR calc Af Amer: >60 mL/min (ref >60)
GFR calc non Af Amer: 48 mL/min — ABNORMAL LOW (ref >60)
GFR calc non Af Amer: >60 mL/min (ref >60)
Glucose, Bld: 98 mg/dL (ref 70–99)
Glucose, Bld: 106 mg/dL — ABNORMAL HIGH (ref 70–99)
Potassium: 4 mmol/L (ref 3.5–5.1)
Potassium: 4.2 mmol/L (ref 3.5–5.1)
Sodium: 137 mmol/L (ref 135–145)
Sodium: 135 mmol/L (ref 135–145)

## 2019-10-15 LAB — PREALBUMIN: Prealbumin: 15.5 mg/dL — ABNORMAL LOW (ref 18–38)

## 2019-10-15 LAB — LACTIC ACID, PLASMA
Lactic Acid, Venous: 1.3 mmol/L (ref 0.5–1.9)
Lactic Acid, Venous: 1.4 mmol/L (ref 0.5–1.9)

## 2019-10-15 LAB — TROPONIN I (HIGH SENSITIVITY)
Troponin I (High Sensitivity): 9 ng/L (ref <18)
Troponin I (High Sensitivity): 8 ng/L (ref <18)

## 2019-10-15 LAB — TSH: TSH: 1.388 u[IU]/mL (ref 0.350–4.500)

## 2019-10-15 LAB — HEPATITIS PANEL, ACUTE
HCV Ab: NONREACTIVE
Hep A IgM: NONREACTIVE
Hep B C IgM: NONREACTIVE
Hepatitis B Surface Ag: NONREACTIVE

## 2019-10-15 LAB — SARS CORONAVIRUS 2 BY RT PCR (HOSPITAL ORDER, PERFORMED IN ~~LOC~~ HOSPITAL LAB): SARS Coronavirus 2: NEGATIVE

## 2019-10-15 LAB — ECHOCARDIOGRAM COMPLETE: S' Lateral: 4.32 cm

## 2019-10-15 LAB — BRAIN NATRIURETIC PEPTIDE: B Natriuretic Peptide: 171.8 pg/mL — ABNORMAL HIGH (ref 0.0–100.0)

## 2019-10-15 LAB — HIV ANTIBODY (ROUTINE TESTING W REFLEX): HIV Screen 4th Generation wRfx: NONREACTIVE

## 2019-10-15 LAB — CREATININE, URINE, RANDOM: Creatinine, Urine: 433.3 mg/dL

## 2019-10-15 LAB — FOLATE: Folate: 13.1 ng/mL (ref >5.9)

## 2019-10-15 LAB — RAPID URINE DRUG SCREEN, HOSP PERFORMED
Amphetamines: NOT DETECTED
Barbiturates: NOT DETECTED
Benzodiazepines: POSITIVE — AB
Cocaine: NOT DETECTED
Opiates: NOT DETECTED
Tetrahydrocannabinol: POSITIVE — AB

## 2019-10-15 LAB — CK: Total CK: 88 U/L (ref 49–397)

## 2019-10-15 LAB — PROTEIN / CREATININE RATIO, URINE
Creatinine, Urine: 470.62 mg/dL
Protein Creatinine Ratio: 0.14 mg/mg{Cre} (ref 0.00–0.15)
Total Protein, Urine: 65 mg/dL

## 2019-10-15 LAB — PROTIME-INR
INR: 1.1 (ref 0.8–1.2)
Prothrombin Time: 13.9 seconds (ref 11.4–15.2)

## 2019-10-15 LAB — MAGNESIUM: Magnesium: 2 mg/dL (ref 1.7–2.4)

## 2019-10-15 LAB — SODIUM, URINE, RANDOM: Sodium, Ur: 43 mmol/L

## 2019-10-15 LAB — BETA-HYDROXYBUTYRIC ACID: Beta-Hydroxybutyric Acid: 1.04 mmol/L — ABNORMAL HIGH (ref 0.05–0.27)

## 2019-10-15 LAB — PHOSPHORUS
Phosphorus: 3 mg/dL (ref 2.5–4.6)
Phosphorus: 2.7 mg/dL (ref 2.5–4.6)

## 2019-10-15 LAB — VITAMIN B12: Vitamin B-12: 354 pg/mL (ref 180–914)

## 2019-10-15 LAB — FERRITIN: Ferritin: 1231 ng/mL — ABNORMAL HIGH (ref 24–336)

## 2019-10-15 MED ORDER — NICOTINE 21 MG/24HR TD PT24
21.0000 mg | MEDICATED_PATCH | Freq: Every day | TRANSDERMAL | Status: DC
  Administered 2019-10-15 – 2019-10-24 (×5): 21 mg via TRANSDERMAL
  Filled 2019-10-15 (×10): qty 1

## 2019-10-15 MED ORDER — DOCUSATE SODIUM 100 MG PO CAPS
100.0000 mg | ORAL_CAPSULE | Freq: Two times a day (BID) | ORAL | Status: DC
  Administered 2019-10-15 – 2019-10-24 (×15): 100 mg via ORAL
  Filled 2019-10-15 (×19): qty 1

## 2019-10-15 MED ORDER — SODIUM CHLORIDE 0.9 % IV SOLN: INTRAVENOUS | Status: DC

## 2019-10-15 MED ORDER — THIAMINE HCL 100 MG/ML IJ SOLN: 100.0000 mg | Freq: Every day | INTRAMUSCULAR | Status: DC

## 2019-10-15 MED ORDER — BOOST / RESOURCE BREEZE PO LIQD CUSTOM
1.0000 | Freq: Three times a day (TID) | ORAL | Status: DC
  Administered 2019-10-16 – 2019-10-19 (×9): 1 via ORAL

## 2019-10-15 MED ORDER — LORAZEPAM 2 MG/ML IJ SOLN: 1.0000 mg | INTRAMUSCULAR | Status: AC | PRN

## 2019-10-15 MED ORDER — SODIUM CHLORIDE 0.9% FLUSH
3.0000 mL | Freq: Two times a day (BID) | INTRAVENOUS | Status: DC
  Administered 2019-10-15 – 2019-10-23 (×9): 3 mL via INTRAVENOUS

## 2019-10-15 MED ORDER — POTASSIUM CHLORIDE 10 MEQ/100ML IV SOLN
10.0000 meq | INTRAVENOUS | Status: DC
  Administered 2019-10-15: 10 meq via INTRAVENOUS
  Filled 2019-10-15: qty 100

## 2019-10-15 MED ORDER — THIAMINE HCL 100 MG PO TABS
100.0000 mg | ORAL_TABLET | Freq: Every day | ORAL | Status: DC
  Administered 2019-10-15 – 2019-10-17 (×3): 100 mg via ORAL
  Filled 2019-10-15 (×3): qty 1

## 2019-10-15 MED ORDER — ACETAMINOPHEN 500 MG PO TABS
500.0000 mg | ORAL_TABLET | Freq: Four times a day (QID) | ORAL | Status: DC | PRN
  Administered 2019-10-15 – 2019-10-18 (×6): 500 mg via ORAL
  Filled 2019-10-15 (×7): qty 1

## 2019-10-15 MED ORDER — LORAZEPAM 1 MG PO TABS
1.0000 mg | ORAL_TABLET | ORAL | Status: AC | PRN
  Administered 2019-10-15 – 2019-10-16 (×3): 1 mg via ORAL
  Administered 2019-10-16: 2 mg via ORAL
  Administered 2019-10-17: 1 mg via ORAL
  Filled 2019-10-15: qty 1
  Filled 2019-10-15: qty 2
  Filled 2019-10-15 (×3): qty 1

## 2019-10-15 MED ORDER — ONDANSETRON HCL 4 MG PO TABS: 4.0000 mg | ORAL_TABLET | Freq: Four times a day (QID) | ORAL | Status: DC | PRN

## 2019-10-15 MED ORDER — FOLIC ACID 1 MG PO TABS
1.0000 mg | ORAL_TABLET | Freq: Every day | ORAL | Status: DC
  Administered 2019-10-15 – 2019-10-24 (×10): 1 mg via ORAL
  Filled 2019-10-15 (×10): qty 1

## 2019-10-15 MED ORDER — ADULT MULTIVITAMIN W/MINERALS CH
1.0000 | ORAL_TABLET | Freq: Every day | ORAL | Status: DC
  Administered 2019-10-15 – 2019-10-24 (×10): 1 via ORAL
  Filled 2019-10-15 (×10): qty 1

## 2019-10-15 MED ORDER — ONDANSETRON HCL 4 MG/2ML IJ SOLN
4.0000 mg | Freq: Four times a day (QID) | INTRAMUSCULAR | Status: DC | PRN
  Administered 2019-10-17: 4 mg via INTRAVENOUS
  Filled 2019-10-15: qty 2

## 2019-10-15 MED ORDER — THIAMINE HCL 100 MG/ML IJ SOLN
100.0000 mg | Freq: Once | INTRAMUSCULAR | Status: AC
  Administered 2019-10-15: 100 mg via INTRAVENOUS
  Filled 2019-10-15: qty 2

## 2019-10-15 MED ORDER — MAGNESIUM SULFATE 2 GM/50ML IV SOLN: 2.0000 g | Freq: Once | INTRAVENOUS | Status: DC

## 2019-10-15 NOTE — Progress Notes (Signed)
  Echocardiogram 2D Echocardiogram has been performed.  Trevor Williams 10/15/2019, 10:13 AM

## 2019-10-15 NOTE — ED Notes (Signed)
BMET to be retaken at 0930am

## 2019-10-15 NOTE — ED Notes (Signed)
ED TO INPATIENT HANDOFF REPORT  Name/Age/Gender Trevor Williams 59 y.o. male  Code Status    Code Status Orders  (From admission, onward)         Start     Ordered   10/15/19 0403  Full code  Continuous        10/15/19 0403        Code Status History    Date Active Date Inactive Code Status Order ID Comments User Context   04/29/2019 0131 05/07/2019 1724 Full Code [id number]  Cristescu, Linard Millers, MD Inpatient   01/26/2019 2009 02/02/2019 1342 Full Code [id number]  [name], DO ED   07/29/2018 0115 08/12/2018 1700 Full Code [id number]  [name], MD ED   Advance Care Planning Activity      Home/SNF/Other Home  Chief Complaint Dehydration [E86.0] Dizziness [R42]  Level of Care/Admitting Diagnosis ED Disposition    ED Disposition Condition [facility]: [place] [[id number]]  Level of Care: Telemetry [5]  Admit to tele based on following criteria: Complex arrhythmia (Bradycardia/Tachycardia)  Covid Evaluation: Confirmed COVID Negative  Diagnosis: Dizziness [580998]  Admitting Physician: Elodia Florence [phone]  Attending Physician: Cephus Slater, A [place] [phone]  Estimated length of stay: past midnight tomorrow  Certification:: I certify this patient will need inpatient services for at least 2 midnights       Medical History Past Medical History:  Diagnosis Date  . Abnormal liver function tests   . Acute respiratory failure ([place])    a. 07/2018 requiring intubation - CAP/CHF.  Marland Kitchen Alcohol abuse   . Biventricular heart failure ([place])   . Cancer ([place])    skin (left hand)  . Delirium    a. h/o delirium while admitted  . Emphysema of lung ([place])   . Homelessness   . Macrocytic anemia   . Mild CAD    a. cath 07/2018 60% ostial ramus otherwise OK.  . Mitral regurgitation   . NICM (nonischemic cardiomyopathy) ([place])   . Protein calorie malnutrition ([place])   . Tricuspid regurgitation     Allergies Allergies   Allergen Reactions  . Spironolactone     gynecomastia  . Doxycycline Rash    Rash noted after administration of vancomycin, doxycycline, and ceftriaxone. Unclear cause of rash.  . Ibuprofen Rash  . Rocephin [Ceftriaxone] Rash    Rash noted after administration of vancomycin, doxycycline, and ceftriaxone. Unclear cause of rash.  . Tylenol [Acetaminophen] Rash  . Vancomycin Rash    Rash noted after administration of vancomycin, doxycycline, and ceftriaxone. Unclear cause of rash.    IV Location/Drains/Wounds Patient Lines/Drains/Airways Status    Active Line/Drains/Airways    Name Placement date Placement time Site Days   Peripheral IV 10/14/19 Right Antecubital 10/14/19  2058  Antecubital  1   Peripheral IV 10/15/19 Left;Upper Arm 10/15/19  0039  Arm  less than 1   Incision (Closed) 04/30/19 Hip 04/30/19  1345   168          Labs/Imaging Results for orders placed or performed during the hospital encounter of 10/14/19 (from the past 48 hour(s))  Lipase, blood     Status: Abnormal   Collection Time: 10/14/19  3:33 PM  Result Value Ref Range   Lipase 53 (H) 11 - 51 U/L    Comment: Performed at [facility], [place] [address], [place], [place] [address]  Comprehensive metabolic panel     Status: Abnormal  Collection Time: 10/14/19  3:33 PM  Result Value Ref Range   Sodium 136 135 - 145 mmol/L   Potassium 2.7 (LL) 3.5 - 5.1 mmol/L    Comment: CRITICAL RESULT CALLED TO, READ BACK BY AND VERIFIED WITH: GARCIA,K. RN @1715  10/14/19 BILLINGSLEY,L    Chloride 96 (L) 98 - 111 mmol/L   CO2 23 22 - 32 mmol/L   Glucose, Bld 93 70 - 99 mg/dL    Comment: Glucose reference range applies only to samples taken after fasting for at least 8 hours.   BUN 27 (H) 6 - 20 mg/dL   Creatinine, Ser 1.54 (H) 0.61 - 1.24 mg/dL   Calcium 8.8 (L) 8.9 - 10.3 mg/dL   Total Protein 7.7 6.5 - 8.1 g/dL   Albumin 3.9 3.5 - 5.0 g/dL   AST 79 (H) 15 - 41 U/L   ALT 38 0 - 44 U/L   Alkaline  Phosphatase 115 38 - 126 U/L   Total Bilirubin 2.2 (H) 0.3 - 1.2 mg/dL   GFR calc non Af Amer 49 (L) >60 mL/min   GFR calc Af Amer 57 (L) >60 mL/min   Anion gap 17 (H) 5 - 15    Comment: Performed at [facility], [place] [address], [place], [place] [address]  CBC     Status: Abnormal   Collection Time: 10/14/19  3:33 PM  Result Value Ref Range   WBC 7.4 4.0 - 10.5 K/uL   RBC 2.94 (L) 4.22 - 5.81 MIL/uL   Hemoglobin 11.2 (L) 13.0 - 17.0 g/dL   HCT 33.6 (L) 39 - 52 %   MCV 114.3 (H) 80.0 - 100.0 fL   MCH 38.1 (H) 26.0 - 34.0 pg   MCHC 33.3 30.0 - 36.0 g/dL   RDW 12.9 11.5 - 15.5 %   Platelets 64 (L) 150 - 400 K/uL    Comment: REPEATED TO VERIFY PLATELET COUNT CONFIRMED BY SMEAR SPECIMEN CHECKED FOR CLOTS Immature Platelet Fraction may be clinically indicated, consider ordering this additional test [id number]    nRBC 0.0 0.0 - 0.2 %    Comment: Performed at [facility], [place] [address], [place], [place] [address]  Magnesium     Status: Abnormal   Collection Time: 10/14/19  3:33 PM  Result Value Ref Range   Magnesium 1.2 (L) 1.7 - 2.4 mg/dL    Comment: Performed at [facility], [place] [address], [place], [place] [address]  Urinalysis, Routine w reflex microscopic     Status: Abnormal   Collection Time: 10/14/19  8:20 PM  Result Value Ref Range   Color, Urine ORANGE (A) YELLOW    Comment: BIOCHEMICALS MAY BE AFFECTED BY COLOR   APPearance HAZY (A) CLEAR   Specific Gravity, Urine >1.030 (H) 1.005 - 1.030   pH 5.0 5.0 - 8.0   Glucose, UA NEGATIVE NEGATIVE mg/dL   Hgb urine dipstick TRACE (A) NEGATIVE   Bilirubin Urine LARGE (A) NEGATIVE   Ketones, ur 15 (A) NEGATIVE mg/dL   Protein, ur 100 (A) NEGATIVE mg/dL   Nitrite POSITIVE (A) NEGATIVE   Leukocytes,Ua NEGATIVE NEGATIVE    Comment: Performed at [facility], [place] [address] [place], [place] [address]  Urinalysis, Microscopic (reflex)     Status:  Abnormal   Collection Time: 10/14/19  8:20 PM  Result Value Ref Range   RBC / HPF 0-5 0 - 5 RBC/hpf   WBC, UA 0-5 0 - 5 WBC/hpf   Bacteria,  UA RARE (A) NONE SEEN   Squamous Epithelial / LPF 0-5 0 - 5    Comment: Performed at [facility], [place] [address] [place], [place] [address]  Ethanol     Status: None   Collection Time: 10/14/19  8:23 PM  Result Value Ref Range   Alcohol, Ethyl (B) <10 <10 mg/dL    Comment: (NOTE) Lowest detectable limit for serum alcohol is 10 mg/dL.  For medical purposes only. Performed at [facility], [place] [address], [place], [place] [address]   POC occult blood, ED     Status: None   Collection Time: 10/14/19 11:55 PM  Result Value Ref Range   Fecal Occult Bld NEGATIVE NEGATIVE  SARS Coronavirus 2 by RT PCR (hospital order, performed in [facility] hospital lab) Nasopharyngeal Nasopharyngeal Swab     Status: None   Collection Time: 10/15/19 12:22 AM   Specimen: Nasopharyngeal Swab  Result Value Ref Range   SARS Coronavirus 2 NEGATIVE NEGATIVE    Comment: (NOTE) SARS-CoV-2 target nucleic acids are NOT DETECTED.  The SARS-CoV-2 RNA is generally detectable in upper and lower respiratory specimens during the acute phase of infection. The lowest concentration of SARS-CoV-2 viral copies this assay can detect is 250 copies / mL. A negative result does not preclude SARS-CoV-2 infection and should not be used as the sole basis for treatment or other patient management decisions.  A negative result may occur with improper specimen collection / handling, submission of specimen other than nasopharyngeal swab, presence of viral mutation(s) within the areas targeted by this assay, and inadequate number of viral copies (<250 copies / mL). A negative result must be combined with clinical observations, patient history, and epidemiological information.  Fact Sheet for Patients:    StrictlyIdeas.no  Fact Sheet for Healthcare Providers: BankingDealers.co.za  This test is not yet approved or  cleared by the [place] FDA and has been authorized for detection and/or diagnosis of SARS-CoV-2 by FDA under an Emergency Use Authorization (EUA).  This EUA will remain in effect (meaning this test can be used) for the duration of the COVID-19 declaration under Section 564(b)(1) of the Act, 21 U.S.C. section 360bbb-3(b)(1), unless the authorization is terminated or revoked sooner.  Performed at [facility], [place] [address] [place], [place] [address]   Lactic acid, plasma     Status: None   Collection Time: 10/15/19 12:22 AM  Result Value Ref Range   Lactic Acid, Venous 1.3 0.5 - 1.9 mmol/L    Comment: Performed at [facility], [place] [address] [place], [place] [address]  Lactic acid, plasma     Status: None   Collection Time: 10/15/19  2:15 AM  Result Value Ref Range   Lactic Acid, Venous 1.4 0.5 - 1.9 mmol/L    Comment: Performed at [facility], [place] [address], [place], [place] [address]  Protime-INR     Status: None   Collection Time: 10/15/19  2:15 AM  Result Value Ref Range   Prothrombin Time 13.9 11.4 - 15.2 seconds   INR 1.1 0.8 - 1.2    Comment: (NOTE) INR goal varies based on device and disease states. Performed at [facility], [place] [address], [place], [place] [address]   Rapid urine drug screen (hospital performed)     Status: Abnormal   Collection Time: 10/15/19  2:15 AM  Result Value Ref Range   Opiates NONE DETECTED NONE DETECTED   Cocaine NONE  DETECTED NONE DETECTED   Benzodiazepines POSITIVE (A) NONE DETECTED   Amphetamines NONE DETECTED NONE DETECTED   Tetrahydrocannabinol POSITIVE (A) NONE DETECTED   Barbiturates NONE DETECTED NONE DETECTED    Comment: (NOTE) DRUG SCREEN FOR MEDICAL PURPOSES ONLY.  IF CONFIRMATION  IS NEEDED FOR ANY PURPOSE, NOTIFY LAB WITHIN 5 DAYS.  LOWEST DETECTABLE LIMITS FOR URINE DRUG SCREEN Drug Class                     Cutoff (ng/mL) Amphetamine and metabolites    1000 Barbiturate and metabolites    200 Benzodiazepine                 742 Tricyclics and metabolites     300 Opiates and metabolites        300 Cocaine and metabolites        300 THC                            50 Performed at [facility], [place] [address] [place], [place] [address]   Troponin I (High Sensitivity)     Status: None   Collection Time: 10/15/19  2:15 AM  Result Value Ref Range   Troponin I (High Sensitivity) 9 <18 ng/L    Comment: (NOTE) Elevated high sensitivity troponin I (hsTnI) values and significant  changes across serial measurements may suggest ACS but many other  chronic and acute conditions are known to elevate hsTnI results.  Refer to the "Links" section for chest pain algorithms and additional  guidance. Performed at [facility], [place] [address], [place], [place] [address]   CK     Status: None   Collection Time: 10/15/19  2:15 AM  Result Value Ref Range   Total CK 88 49.0 - 397.0 U/L    Comment: Performed at [facility], [place] [address], [place], [place] [address]  Beta-hydroxybutyric acid     Status: Abnormal   Collection Time: 10/15/19  2:15 AM  Result Value Ref Range   Beta-Hydroxybutyric Acid 1.04 (H) 0.05 - 0.27 mmol/L    Comment: Performed at [facility], [place] [address], [place], [place] [address]  Vitamin B12     Status: None   Collection Time: 10/15/19  2:15 AM  Result Value Ref Range   Vitamin B-12 354 180 - 914 pg/mL    Comment: (NOTE) This assay is not validated for testing neonatal or myeloproliferative syndrome specimens for Vitamin B12 levels. Performed at [facility], [place] [address], [place],  [address]   Folate     Status: None   Collection Time:  10/15/19  2:15 AM  Result Value Ref Range   Folate 13.1 >5.9 ng/mL    Comment: Performed at [facility], [place] [address], [place], [place] [address]  Iron and TIBC     Status: Abnormal   Collection Time: 10/15/19  2:15 AM  Result Value Ref Range   Iron 126 45 - 182 ug/dL   TIBC 183 (L) 250 - 450 ug/dL   Saturation Ratios 69 (H) 17.9 - 39.5 %   UIBC 57 ug/dL    Comment: Performed at [facility], [place] [address], [place], [place] [address]  Ferritin     Status: Abnormal   Collection Time: 10/15/19  2:15 AM  Result Value Ref Range   Ferritin 1,231 (H) 24 - 336 ng/mL  Comment: Performed at [facility], [place] [address] [place], [place] [address]  Reticulocytes     Status: Abnormal   Collection Time: 10/15/19  2:15 AM  Result Value Ref Range   Retic Ct Pct 1.0 0.4 - 3.1 %   RBC. 2.82 (L) 4.22 - 5.81 MIL/uL   Retic Count, Absolute 29.0 19.0 - 186.0 K/uL   Immature Retic Fract 10.5 2.3 - 15.9 %    Comment: Performed at [facility], [place] [address] [place], [place] [address]  Hepatitis panel, acute     Status: None   Collection Time: 10/15/19  2:15 AM  Result Value Ref Range   Hepatitis B Surface Ag NON REACTIVE NON REACTIVE   HCV Ab NON REACTIVE NON REACTIVE    Comment: (NOTE) Nonreactive HCV antibody screen is consistent with no HCV infections,  unless recent infection is suspected or other evidence exists to indicate HCV infection.     Hep A IgM NON REACTIVE NON REACTIVE   Hep B C IgM NON REACTIVE NON REACTIVE    Comment: Performed at [facility], [place] [address], [place], [place] [address]  Sodium, urine, random     Status: None   Collection Time: 10/15/19  2:15 AM  Result Value Ref Range   Sodium, Ur 43 mmol/L    Comment: Performed at [facility], [place] [address], [place], [place] [address]  Creatinine, urine, random     Status: None   Collection Time: 10/15/19  2:15 AM   Result Value Ref Range   Creatinine, Urine 433.30 mg/dL    Comment: RESULTS CONFIRMED BY MANUAL DILUTION Performed at [place] [address] [place], [place] [address]   Brain natriuretic peptide     Status: Abnormal   Collection Time: 10/15/19  2:15 AM  Result Value Ref Range   B Natriuretic Peptide 171.8 (H) 0.0 - 100.0 pg/mL    Comment: Performed at [facility], [place] [address], [place], [place] [address]  Phosphorus     Status: None   Collection Time: 10/15/19  2:15 AM  Result Value Ref Range   Phosphorus 3.0 2.5 - 4.6 mg/dL    Comment: Performed at [facility], [place] [address] [place], [place] [address]  Basic metabolic panel     Status: Abnormal   Collection Time: 10/15/19  2:15 AM  Result Value Ref Range   Sodium 137 135 - 145 mmol/L   Potassium 4.0 3.5 - 5.1 mmol/L    Comment: SLIGHT HEMOLYSIS DELTA CHECK NOTED    Chloride 101 98 - 111 mmol/L   CO2 21 (L) 22 - 32 mmol/L   Glucose, Bld 98 70 - 99 mg/dL    Comment: Glucose reference range applies only to samples taken after fasting for at least 8 hours.   BUN 29 (H) 6 - 20 mg/dL   Creatinine, Ser 1.57 (H) 0.61 - 1.24 mg/dL   Calcium 7.9 (L) 8.9 - 10.3 mg/dL   GFR calc non Af Amer 48 (L) >60 mL/min   GFR calc Af Amer 55 (L) >60 mL/min   Anion gap 15 5 - 15    Comment: Performed at [facility], [place] [address], [place], [place] [address]  Troponin I (High Sensitivity)     Status: None   Collection Time: 10/15/19  4:06 AM  Result Value Ref Range   Troponin I (High Sensitivity) 8 <18 ng/L    Comment: (  NOTE) Elevated high sensitivity troponin I (hsTnI) values and significant  changes across serial measurements may suggest ACS but many other  chronic and acute conditions are known to elevate hsTnI results.  Refer to the "Links" section for chest pain algorithms and additional  guidance. Performed at [facility], [place]  [address], [place], [place] [address]   HIV Antibody (routine testing w rflx)     Status: None   Collection Time: 10/15/19  4:06 AM  Result Value Ref Range   HIV Screen 4th Generation wRfx Non Reactive Non Reactive    Comment: Performed at [facility], [place] [address] [place], [place] [address]  Magnesium     Status: None   Collection Time: 10/15/19  4:06 AM  Result Value Ref Range   Magnesium 2.0 1.7 - 2.4 mg/dL    Comment: Performed at [facility], [place] [address], [place], [place] [address]  Phosphorus     Status: None   Collection Time: 10/15/19  4:06 AM  Result Value Ref Range   Phosphorus 2.7 2.5 - 4.6 mg/dL    Comment: Performed at [facility], [place] [address], [place], [place] [address]  CBC WITH DIFFERENTIAL     Status: Abnormal   Collection Time: 10/15/19  4:06 AM  Result Value Ref Range   WBC 5.9 4.0 - 10.5 K/uL   RBC 2.56 (L) 4.22 - 5.81 MIL/uL   Hemoglobin 9.6 (L) 13.0 - 17.0 g/dL   HCT 29.6 (L) 39 - 52 %   MCV 115.6 (H) 80.0 - 100.0 fL   MCH 37.5 (H) 26.0 - 34.0 pg   MCHC 32.4 30.0 - 36.0 g/dL   RDW 12.7 11.5 - 15.5 %   Platelets 43 (L) 150 - 400 K/uL    Comment: REPEATED TO VERIFY PLATELET COUNT CONFIRMED BY SMEAR Immature Platelet Fraction may be clinically indicated, consider ordering this additional test [id number]    nRBC 0.0 0.0 - 0.2 %   Neutrophils Relative % 66 %   Neutro Abs 3.9 1.7 - 7.7 K/uL   Lymphocytes Relative 22 %   Lymphs Abs 1.3 0.7 - 4.0 K/uL   Monocytes Relative 9 %   Monocytes Absolute 0.5 0 - 1 K/uL   Eosinophils Relative 2 %   Eosinophils Absolute 0.1 0 - 0 K/uL   Basophils Relative 1 %   Basophils Absolute 0.0 0 - 0 K/uL   Immature Granulocytes 0 %   Abs Immature Granulocytes 0.02 0.00 - 0.07 K/uL    Comment: Performed at [facility], [place] [address], [place], [place] [address]  TSH     Status: None   Collection Time: 10/15/19  4:06 AM  Result Value Ref Range   TSH  1.388 0.350 - 4.500 uIU/mL    Comment: Performed by a 3rd Generation assay with a functional sensitivity of <=0.01 uIU/mL. Performed at [facility], [place] [address], [place], [place] [address]   Comprehensive metabolic panel     Status: Abnormal   Collection Time: 10/15/19  4:06 AM  Result Value Ref Range   Sodium 137 135 - 145 mmol/L   Potassium 3.7 3.5 - 5.1 mmol/L   Chloride 103 98 - 111 mmol/L   CO2 20 (L) 22 - 32 mmol/L   Glucose, Bld 86 70 - 99 mg/dL    Comment: Glucose reference range applies only to samples taken after fasting for at least 8 hours.   BUN 29 (  H) 6 - 20 mg/dL   Creatinine, Ser 1.38 (H) 0.61 - 1.24 mg/dL   Calcium 7.6 (L) 8.9 - 10.3 mg/dL   Total Protein 5.8 (L) 6.5 - 8.1 g/dL   Albumin 2.9 (L) 3.5 - 5.0 g/dL   AST 71 (H) 15 - 41 U/L   ALT 31 0 - 44 U/L   Alkaline Phosphatase 94 38 - 126 U/L   Total Bilirubin 1.6 (H) 0.3 - 1.2 mg/dL   GFR calc non Af Amer 56 (L) >60 mL/min   GFR calc Af Amer >60 >60 mL/min   Anion gap 14 5 - 15    Comment: Performed at [facility], [place] [address] [place], [place] [address]  Prealbumin     Status: Abnormal   Collection Time: 10/15/19  4:06 AM  Result Value Ref Range   Prealbumin 15.5 (L) 18 - 38 mg/dL    Comment: Performed at [facility], [place] [address], [place],  [address]   CT Head Wo Contrast  Result Date: 10/14/2019 CLINICAL DATA:  59 year old male with vertigo. EXAM: CT HEAD WITHOUT CONTRAST TECHNIQUE: Contiguous axial images were obtained from the base of the skull through the vertex without intravenous contrast. COMPARISON:  Head CT dated 04/28/2019. FINDINGS: Brain: There is mild age-related atrophy and chronic microvascular ischemic changes. 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 There is no acute intracranial hemorrhage. No mass effect or midline shift. No extra-axial fluid collection.  Vascular: No hyperdense vessel or unexpected calcification. Skull: Normal. Negative for fracture or focal lesion. Sinuses/Orbits: No acute finding. Other: None IMPRESSION: 1. No acute intracranial pathology. 2. Age-related atrophy and chronic microvascular ischemic changes. 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 Electronically Signed   By: Anner Crete M.D.   On: 10/14/2019 22:30   MR BRAIN WO CONTRAST  Result Date: 10/14/2019 CLINICAL DATA:  59 year old male with vertigo. EXAM: MRI HEAD WITHOUT CONTRAST TECHNIQUE: Multiplanar, multiecho pulse sequences of the brain and surrounding structures were obtained without intravenous contrast. COMPARISON:  Head and cervical spine CT 04/28/2019. FINDINGS: Brain: No restricted diffusion or evidence of acute infarction. Scattered chronic encephalomalacia in the right MCA territory, most pronounced in the right inferior frontal gyrus and superior temporal gyrus. Stable cerebral volume since February. Stable mild ex vacuo ventricular enlargement. No midline shift, mass effect, evidence of mass lesion, ventriculomegaly, extra-axial collection or acute intracranial hemorrhage. Cervicomedullary junction and pituitary are within normal limits. There is also a small area of cortical encephalomalacia in the left superior temporal gyrus. No definite chronic cerebral blood products. Deep gray nuclei, brainstem and cerebellum remain within normal limits. Vascular: Major intracranial vascular flow voids are preserved. Skull and upper cervical spine: Negative, Visualized bone marrow signal is within normal limits. Sinuses/Orbits: Negative orbits. Paranasal sinuses and mastoids are stable and well pneumatized. Other: Grossly normal visible internal auditory structures. Normal stylomastoid foramina. There is a chronic cyst along the inferior margin of the right pinna (series 10, image 3), stable. IMPRESSION: 1. No acute intracranial abnormality. 2. Chronic  frontotemporal cortex encephalomalacia, most pronounced in the right MCA territory, could be post ischemic and/or posttraumatic. Electronically Signed   By: Genevie Ann M.D.   On: 10/14/2019 22:11   DG CHEST PORT 1 VIEW  Result Date: 10/15/2019 CLINICAL DATA:  Tachycardia EXAM: PORTABLE CHEST 1 VIEW COMPARISON:  08/21/2019 FINDINGS: Cardiac shadow is within normal limits. Aortic calcifications are seen. The lungs are clear. No bony abnormality  is noted. IMPRESSION: No acute abnormality seen. Electronically Signed   By: Inez Catalina M.D.   On: 10/15/2019 02:58   ECHOCARDIOGRAM COMPLETE  Result Date: 10/15/2019    ECHOCARDIOGRAM REPORT   Patient Name:   Tavon Gaster Date of Exam: 10/15/2019 Medical Rec #:  [id number]     Height:       66.5 in Accession #:    [id number]    Weight:       120.0 lb Date of Birth:  Aug 18, 1960     BSA:          1.619 m Patient Age:    60 years      BP:           107/59 mmHg Patient Gender: M             HR:           97 bpm. Exam Location:  Inpatient Procedure: 2D Echo Indications:    Abnormal ECG R94.31  History:        Patient has prior history of Echocardiogram examinations, most                 recent 04/30/2019. Cardiomyopathy; Risk Factors:Alcohol abuse.  Sonographer:    Mikki Santee RDCS (AE) Referring Phys: [place]  1. Left ventricular ejection fraction, by estimation, is 60 to 65%. The left ventricle has normal function. The left ventricle has no regional wall motion abnormalities. Left ventricular diastolic parameters are consistent with Grade I diastolic dysfunction (impaired relaxation).  2. Right ventricular systolic function is normal. The right ventricular size is normal. There is normal pulmonary artery systolic pressure.  3. Left atrial size was moderately dilated.  4. The mitral valve is normal in structure. No evidence of mitral valve regurgitation. No evidence of mitral stenosis.  5. The aortic valve is tricuspid. Aortic valve  regurgitation is not visualized. No aortic stenosis is present.  6. Aortic dilatation noted. There is mild dilatation at the level of the sinuses of Valsalva measuring 41 mm.  7. The inferior vena cava is normal in size with greater than 50% respiratory variability, suggesting right atrial pressure of 3 mmHg. FINDINGS  Left Ventricle: Left ventricular ejection fraction, by estimation, is 60 to 65%. The left ventricle has normal function. The left ventricle has no regional wall motion abnormalities. The left ventricular internal cavity size was normal in size. There is  no left ventricular hypertrophy. Left ventricular diastolic parameters are consistent with Grade I diastolic dysfunction (impaired relaxation). Right Ventricle: The right ventricular size is normal. No increase in right ventricular wall thickness. Right ventricular systolic function is normal. There is normal pulmonary artery systolic pressure. The tricuspid regurgitant velocity is 1.87 m/s, and  with an assumed right atrial pressure of 3 mmHg, the estimated right ventricular systolic pressure is 59.1 mmHg. Left Atrium: Left atrial size was moderately dilated. Right Atrium: Right atrial size was normal in size. Pericardium: There is no evidence of pericardial effusion. Mitral Valve: The mitral valve is normal in structure. Normal mobility of the mitral valve leaflets. No evidence of mitral valve regurgitation. No evidence of mitral valve stenosis. Tricuspid Valve: The tricuspid valve is normal in structure. Tricuspid valve regurgitation is mild . No evidence of tricuspid stenosis. Aortic Valve: The aortic valve is tricuspid. Aortic valve regurgitation is not visualized. No aortic stenosis is present. Pulmonic Valve: The pulmonic valve was normal in structure. Pulmonic valve regurgitation is not visualized. No evidence of pulmonic stenosis.  Aorta: Aortic dilatation noted. There is mild dilatation at the level of the sinuses of Valsalva measuring 41 mm.  Venous: The inferior vena cava is normal in size with greater than 50% respiratory variability, suggesting right atrial pressure of 3 mmHg. IAS/Shunts: No atrial level shunt detected by color flow Doppler.  LEFT VENTRICLE PLAX 2D LVIDd:         4.75 cm  Diastology LVIDs:         4.32 cm  LV e' lateral: 9.14 cm/s LV PW:         0.94 cm LV IVS:        0.80 cm LVOT diam:     2.60 cm LV SV:         52 LV SV Index:   32 LVOT Area:     5.31 cm  RIGHT VENTRICLE RV S prime:     9.57 cm/s TAPSE (M-mode): 1.1 cm LEFT ATRIUM             Index       RIGHT ATRIUM           Index LA diam:        3.10 cm 1.92 cm/m  RA Area:     13.90 cm LA Vol (A2C):   63.2 ml 39.05 ml/m RA Volume:   35.30 ml  21.81 ml/m LA Vol (A4C):   63.0 ml 38.92 ml/m LA Biplane Vol: 64.6 ml 39.91 ml/m  AORTIC VALVE LVOT Vmax:   54.80 cm/s LVOT Vmean:  42.400 cm/s LVOT VTI:    0.097 m  AORTA Ao Root diam: 4.10 cm Ao Asc diam:  4.20 cm TRICUSPID VALVE TR Peak grad:   14.0 mmHg TR Vmax:        187.00 cm/s  SHUNTS Systemic VTI:  0.10 m Systemic Diam: 2.60 cm Skeet Latch MD Electronically signed by Skeet Latch MD Signature Date/Time: 10/15/2019/4:06:01 PM    Final    US Abdomen Limited RUQ  Result Date: 10/15/2019 CLINICAL DATA:  Elevated LFTs EXAM: ULTRASOUND ABDOMEN LIMITED RIGHT UPPER QUADRANT COMPARISON:  None. FINDINGS: Gallbladder: Layering calcified gallstones are present the largest measuring 3 mm. Layering sludge is also noted. No sonographic Murphy sign or wall thickening is seen. Common bile duct: Diameter: 3 mm Liver: Increased echotexture seen throughout. No focal abnormality or biliary ductal dilatation. Portal vein is patent on color Doppler imaging with normal direction of blood flow towards the liver. Other: None. IMPRESSION: Cholelithiasis without evidence of acute cholecystitis. Hepatic steatosis Electronically Signed   By: Prudencio Pair M.D.   On: 10/15/2019 02:17    Pending Labs Unresulted Labs (From admission, onward)  Comment          Start     Ordered   10/15/19 1844  Culture, Urine  Once,   STAT        10/15/19 1843   10/15/19 1844  Protein / creatinine ratio, urine  Once,   STAT        10/15/19 1843   10/15/19 2956  Basic metabolic panel  Once,   STAT        10/15/19 0349   10/15/19 0030  Blood culture (routine x 2)  BLOOD CULTURE X 2,   STAT      10/15/19 0029          Vitals/Pain Today's Vitals   10/15/19 1300 10/15/19 1500 10/15/19 1647 10/15/19 1847  BP: (!) 123/97 (!) 131/83 (!) 138/96 (!) 132/81  Pulse: 71 (!) 43 92 (!)  110  Resp: 19 15 22 20   Temp:      TempSrc:      SpO2: (!) 50% (!) 85% 97% 95%  PainSc:        Isolation Precautions No active isolations  Medications Medications  LORazepam (ATIVAN) tablet 1-4 mg (has no administration in time range)    Or  LORazepam (ATIVAN) injection 1-4 mg (has no administration in time range)  thiamine tablet 100 mg (100 mg Oral Given 10/15/19 1012)    Or  thiamine (B-1) injection 100 mg ( Intravenous See Alternative 08/30/22 4975)  folic acid (FOLVITE) tablet 1 mg (1 mg Oral Given 10/15/19 1012)  multivitamin with minerals tablet 1 tablet (1 tablet Oral Given 10/15/19 1012)  sodium chloride flush (NS) 0.9 % injection 3 mL (3 mLs Intravenous Given 10/15/19 1014)  docusate sodium (COLACE) capsule 100 mg (100 mg Oral Given 10/15/19 1012)  ondansetron (ZOFRAN) tablet 4 mg (has no administration in time range)    Or  ondansetron (ZOFRAN) injection 4 mg (has no administration in time range)  0.9 %  sodium chloride infusion ( Intravenous Stopped 10/15/19 1543)  nicotine (NICODERM CQ - dosed in mg/24 hours) patch 21 mg (21 mg Transdermal Patch Applied 10/15/19 1011)  sodium chloride flush (NS) 0.9 % injection 3 mL (3 mLs Intravenous Given 10/14/19 2107)  potassium chloride SA (KLOR-CON) CR tablet 40 mEq (40 mEq Oral Given 10/14/19 1725)  sodium chloride 0.9 % bolus 1,000 mL (0 mLs Intravenous Stopped 10/15/19 0039)  potassium chloride 10 mEq in 100  mL IVPB (0 mEq Intravenous Stopped 10/15/19 0207)  ondansetron (ZOFRAN) injection 4 mg (4 mg Intravenous Given 10/14/19 2102)  meclizine (ANTIVERT) tablet 25 mg (25 mg Oral Given 10/14/19 2103)  LORazepam (ATIVAN) injection 1 mg (1 mg Intravenous Given 10/14/19 2104)  magnesium sulfate IVPB 2 g 50 mL (0 g Intravenous Stopped 10/14/19 2355)  sodium chloride 0.9 % bolus 1,000 mL (0 mLs Intravenous Stopped 10/15/19 0211)  thiamine (B-1) injection 100 mg (100 mg Intravenous Given 10/15/19 0229)    Mobility walks

## 2019-10-15 NOTE — H&P (Signed)
Trevor Williams IHK:[id number] DOB: 07/06/1960 DOA: 10/14/2019     PCP: Elsie Stain, MD   Outpatient Specialists:  NONE    Patient arrived to ER on 10/14/19 at 1503 Referred by Attending Gareth Morgan, MD   Patient coming from: Homeless Chief Complaint:  Chief Complaint  Patient presents with  . Diarrhea    HPI: Trevor Williams is a 59 y.o. 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 History of cardiomyopathy history of respiratory failure requiring intubation, COPD, anemia, mild CAD, malnutrition, tobacco abuse  Presented with chills, sweats and diarrhea he is homeless and was found on the side of the road States he had dizziness and hard time walking No CVA per MrI one month of nausea vomiting and diarrhea Last eTOH was this AM  Minimizes his ETOH abuse  He continues to smoke Infectious risk factors:  Reports*hot flashes shortness of breath,  N/V/Diarrhea/abdominal pain,     Has  NOt been vaccinated against COVID    Initial COVID TEST   in house  PCR testing  Pending  Lab Results  Component Value Date   [place] 05/06/2019   [place] NEGATIVE 04/28/2019   [place] NEGATIVE 01/26/2019   [place] NOT DETECTED 08/05/2018    Regarding pertinent Chronic problems:     chronic CHF  Systolic/diastolic/combined- last echo echogram from to February 2021 showed global kinesis and estimated EF of 30% with grade 1 diastolic dysfunction    CAD  -  mild noncompliant with medications       COPD - not  followed by pulmonology  not  on baseline oxygen   continues to smoke    Liver disease MELD-Na score: 7 at 01/29/2019    Chronic anemia - baseline hg Hemoglobin & Hematocrit  Recent Labs    06/25/19 1007 08/21/19 0117 10/14/19 1533  HGB 11.6* 11.1* 11.2*     While in ER: Got Ativan 1 mg For MRI  He became hypotensive Got IV fluids Now up to 106 SBP KCL 2.7 lactiac acid 1.3  Right upper quadrant ultrasound  hepatic steatosis cholelithiasis without evidence of acute cholecystitis  Hospitalist was called for admission for dehydration  The following Work up has been ordered so far:  Orders Placed This Encounter  Procedures  . SARS Coronavirus 2 by RT PCR (hospital order, performed in [facility] lab) Nasopharyngeal Nasopharyngeal Swab  . Blood culture (routine x 2)  . CT Head Wo Contrast  . MR BRAIN WO CONTRAST  . Lipase, blood  . Comprehensive metabolic panel  . CBC  . Urinalysis, Routine w reflex microscopic  . Magnesium  . Ethanol  . Urinalysis, Microscopic (reflex)  . Lactic acid, plasma  . Diet NPO time specified  . Saline Lock IV, Maintain IV access  . Consult to hospitalist  ALL PATIENTS BEING ADMITTED/HAVING PROCEDURES NEED COVID-19 SCREENING  . POC occult blood, ED  . ED EKG  . ED EKG    Following Medications were ordered in ER: Medications  potassium chloride 10 mEq in 100 mL IVPB (10 mEq Intravenous New Bag/Given 10/14/19 2338)  sodium chloride flush (NS) 0.9 % injection 3 mL (3 mLs Intravenous Given 10/14/19 2107)  potassium chloride SA (KLOR-CON) CR tablet 40 mEq (40 mEq Oral Given 10/14/19 1725)  sodium chloride 0.9 % bolus 1,000 mL (0 mLs Intravenous Stopped 10/15/19 0039)  ondansetron (ZOFRAN) injection 4 mg (4 mg Intravenous Given 10/14/19 2102)  meclizine (ANTIVERT) tablet 25 mg (25 mg  Oral Given 10/14/19 2103)  LORazepam (ATIVAN) injection 1 mg (1 mg Intravenous Given 10/14/19 2104)  magnesium sulfate IVPB 2 g 50 mL (0 g Intravenous Stopped 10/14/19 2355)  sodium chloride 0.9 % bolus 1,000 mL (1,000 mLs Intravenous New Bag/Given 10/15/19 0038)        Consult Orders  (From admission, onward)         Start     Ordered   10/15/19 0032  Consult to hospitalist  ALL PATIENTS BEING ADMITTED/HAVING PROCEDURES NEED COVID-19 SCREENING  Once       Comments: ALL PATIENTS BEING ADMITTED/HAVING PROCEDURES NEED COVID-19 SCREENING  Provider:  (Not yet assigned)    Question Answer Comment  Place call to: [facility]   Reason for Consult Admit      10/15/19 0031          Significant initial  Findings: Abnormal Labs Reviewed  LIPASE, BLOOD - Abnormal; Notable for the following components:      Result Value   Lipase 53 (*)    All other components within normal limits  COMPREHENSIVE METABOLIC PANEL - Abnormal; Notable for the following components:   Potassium 2.7 (*)    Chloride 96 (*)    BUN 27 (*)    Creatinine, Ser 1.54 (*)    Calcium 8.8 (*)    AST 79 (*)    Total Bilirubin 2.2 (*)    GFR calc non Af Amer 49 (*)    GFR calc Af Amer 57 (*)    Anion gap 17 (*)    All other components within normal limits  CBC - Abnormal; Notable for the following components:   RBC 2.94 (*)    Hemoglobin 11.2 (*)    HCT 33.6 (*)    MCV 114.3 (*)    MCH 38.1 (*)    Platelets 64 (*)    All other components within normal limits  URINALYSIS, ROUTINE W REFLEX MICROSCOPIC - Abnormal; Notable for the following components:   Color, Urine ORANGE (*)    APPearance HAZY (*)    Specific Gravity, Urine >1.030 (*)    Hgb urine dipstick TRACE (*)    Bilirubin Urine LARGE (*)    Ketones, ur 15 (*)    Protein, ur 100 (*)    Nitrite POSITIVE (*)    All other components within normal limits  MAGNESIUM - Abnormal; Notable for the following components:   Magnesium 1.2 (*)    All other components within normal limits  URINALYSIS, MICROSCOPIC (REFLEX) - Abnormal; Notable for the following components:   Bacteria, UA RARE (*)    All other components within normal limits    Otherwise labs showing:    Recent Labs  Lab 10/14/19 1533  NA 136  K 2.7*  CO2 23  GLUCOSE 93  BUN 27*  CREATININE 1.54*  CALCIUM 8.8*  MG 1.2*   Cr   ,  Up from baseline see below Lab Results  Component Value Date   CREATININE 1.54 (H) 10/14/2019   CREATININE 0.91 08/21/2019   CREATININE 0.71 08/07/2019    Recent Labs  Lab 10/14/19 1533  AST 79*  ALT 38  ALKPHOS 115   BILITOT 2.2*  PROT 7.7  ALBUMIN 3.9   Lab Results  Component Value Date   CALCIUM 8.8 (L) 10/14/2019   PHOS 4.7 (H) 04/29/2019      WBC      Component Value Date/Time   WBC 7.4 10/14/2019 1533   ANC    Component  Value Date/Time   NEUTROABS 1.5 (L) 08/21/2019 0117   NEUTROABS 3.7 06/25/2019 1007   ALC No components found for: LYMPHAB    Plt: Lab Results  Component Value Date   PLT 64 (L) 10/14/2019    Lactic Acid, Venous    Component Value Date/Time   LATICACIDVEN 1.3 10/15/2019 0022        COVID-19 Labs  No results for input(s): DDIMER, FERRITIN, LDH, CRP in the last 72 hours.  Lab Results  Component Value Date   SARSCOV2NAA NEGATIVE 05/06/2019   SARSCOV2NAA NEGATIVE 04/28/2019   [place] NEGATIVE 01/26/2019   SARSCOV2NAA NOT DETECTED 08/05/2018     HG/HCT   Stable,     Component Value Date/Time   HGB 11.2 (L) 10/14/2019 1533   HGB 11.6 (L) 06/25/2019 1007   HCT 33.6 (L) 10/14/2019 1533   HCT 34.6 (L) 06/25/2019 1007    Recent Labs  Lab 10/14/19 1533  LIPASE 53*   No results for input(s): AMMONIA in the last 168 hours.        ECG: Ordered Personally reviewed by me showing: HR : 99 Rhythm:  NSR,    no evidence of ischemic changes QTC 518   BNP (last 3 results) Recent Labs    04/29/19 0150 07/17/19 1058 08/21/19 0117  BNP 163.2* 54.3 53.9      DM  labs:  HbA1C: No results for input(s): HGBA1C in the last 8760 hours.     CBG (last 3)  No results for input(s): GLUCAP in the last 72 hours.     UA no evidence of UTI     Urine analysis:    Component Value Date/Time   COLORURINE ORANGE (A) 10/14/2019 2020   APPEARANCEUR HAZY (A) 10/14/2019 2020   LABSPEC >1.030 (H) 10/14/2019 2020   PHURINE 5.0 10/14/2019 2020   GLUCOSEU NEGATIVE 10/14/2019 2020   HGBUR TRACE (A) 10/14/2019 2020   BILIRUBINUR LARGE (A) 10/14/2019 2020   KETONESUR 15 (A) 10/14/2019 2020   PROTEINUR 100 (A) 10/14/2019 2020   NITRITE POSITIVE (A)  10/14/2019 2020   LEUKOCYTESUR NEGATIVE 10/14/2019 2020       Ordered  CT HEAD NON acute Right upper quadrant ultrasound showing hepatic steatosis MRI brain showed old CVA but nothing acute.   ED Triage Vitals  Enc Vitals Group     BP 10/14/19 1511 (!) 155/81     Pulse Rate 10/14/19 1511 53     Resp 10/14/19 1511 18     Temp 10/14/19 1511 97.8 F (36.6 C)     Temp Source 10/14/19 1511 Oral     SpO2 10/14/19 1511 100 %     Weight --      Height --      Head Circumference --      Peak Flow --      Pain Score 10/14/19 1509 8     Pain Loc --      Pain Edu? --      Excl. in [place]? --   TMAX(24)@       Latest  Blood pressure 104/66, pulse 72, temperature 98 F (36.7 C), temperature source Oral, resp. rate 20, SpO2 99 %.       Review of Systems:    Pertinent positives include:   chills, fatigue,  Constitutional:  No weight loss, night sweats, Fevers, weight loss  HEENT:  No headaches, Difficulty swallowing,Tooth/dental problems,Sore throat,  No sneezing, itching, ear ache, nasal congestion, post nasal drip,  Cardio-vascular:  No chest  pain, Orthopnea, PND, anasarca, dizziness, palpitations.no Bilateral lower extremity swelling  GI:  No heartburn, indigestion, abdominal pain, nausea, vomiting, diarrhea, change in bowel habits, loss of appetite, melena, blood in stool, hematemesis Resp:  no shortness of breath at rest. No dyspnea on exertion, No excess mucus, no productive cough, No non-productive cough, No coughing up of blood.No change in color of mucus.No wheezing. Skin:  no rash or lesions. No jaundice GU:  no dysuria, change in color of urine, no urgency or frequency. No straining to urinate.  No flank pain.  Musculoskeletal:  No joint pain or no joint swelling. No decreased range of motion. No back pain.  Psych:  No change in mood or affect. No depression or anxiety. No memory loss.  Neuro: no localizing neurological complaints, no tingling, no weakness, no  double vision, no gait abnormality, no slurred speech, no confusion  All systems reviewed and apart from [place] all are negative  Past Medical History:   Past Medical History:  Diagnosis Date  . Abnormal liver function tests   . Acute respiratory failure ([place])    a. 07/2018 requiring intubation - CAP/CHF.  Marland Kitchen Alcohol abuse   . Biventricular heart failure ([place])   . Cancer ([place])    skin (left hand)  . Delirium    a. h/o delirium while admitted  . Emphysema of lung ([place])   . Homelessness   . Macrocytic anemia   . Mild CAD    a. cath 07/2018 60% ostial ramus otherwise OK.  . Mitral regurgitation   . NICM (nonischemic cardiomyopathy) ([place])   . Protein calorie malnutrition ([place])   . Tricuspid regurgitation       Past Surgical History:  Procedure Laterality Date  . BIOPSY  01/28/2019   Procedure: BIOPSY;  Surgeon: Otis Brace, MD;  Location: [facility];  Service: Gastroenterology;;  . BIOPSY  01/30/2019   Procedure: BIOPSY;  Surgeon: Otis Brace, MD;  Location: [facility];  Service: Gastroenterology;;  . COLONOSCOPY WITH PROPOFOL N/A 01/30/2019   Procedure: COLONOSCOPY WITH PROPOFOL;  Surgeon: Otis Brace, MD;  Location: [facility];  Service: Gastroenterology;  Laterality: N/A;  . ESOPHAGOGASTRODUODENOSCOPY (EGD) WITH PROPOFOL N/A 01/28/2019   Procedure: ESOPHAGOGASTRODUODENOSCOPY (EGD) WITH PROPOFOL;  Surgeon: Otis Brace, MD;  Location: [facility];  Service: Gastroenterology;  Laterality: N/A;  . FEMUR IM NAIL Right 04/30/2019   Procedure: INTRAMEDULLARY (IM) NAIL FEMORAL;  Surgeon: Paralee Cancel, MD;  Location: [facility];  Service: Orthopedics;  Laterality: Right;  . HEMOSTASIS CLIP PLACEMENT  01/30/2019   Procedure: HEMOSTASIS CLIP PLACEMENT;  Surgeon: Otis Brace, MD;  Location: [facility];  Service: Gastroenterology;;  . POLYPECTOMY  01/30/2019   Procedure: POLYPECTOMY;  Surgeon: Otis Brace, MD;  Location: [facility];  Service:  Gastroenterology;;  . RIGHT/LEFT HEART CATH AND CORONARY ANGIOGRAPHY N/A 08/09/2018   Procedure: RIGHT/LEFT HEART CATH AND CORONARY ANGIOGRAPHY;  Surgeon: Larey Dresser, MD;  Location: [place] [facility];  Service: Cardiovascular;  Laterality: N/A;    Social History:  Ambulatory   Cane,      reports that he has been smoking cigarettes. He has been smoking about 1.00 pack per day. He uses smokeless tobacco. He reports current alcohol use. He reports that he does not use drugs.   Family History:   Family History  Problem Relation Age of Onset  . Hypertension Maternal Grandfather     Allergies: Allergies  Allergen Reactions  . Spironolactone     gynecomastia  . Doxycycline Rash    Rash  noted after administration of vancomycin, doxycycline, and ceftriaxone. Unclear cause of rash.  . Ibuprofen Rash  . Rocephin [Ceftriaxone] Rash    Rash noted after administration of vancomycin, doxycycline, and ceftriaxone. Unclear cause of rash.  . Tylenol [Acetaminophen] Rash  . Vancomycin Rash    Rash noted after administration of vancomycin, doxycycline, and ceftriaxone. Unclear cause of rash.     Prior to Admission medications   Medication Sig Start Date End Date Taking? Authorizing Provider  meloxicam (MOBIC) 7.5 MG tablet TAKE 1 TABLET (7.5 MG TOTAL) BY MOUTH DAILY. Patient not taking: Reported on 07/31/2019 07/25/19   Elsie Stain, MD  albuterol (VENTOLIN HFA) 108 (90 Base) MCG/ACT inhaler Inhale 2 puffs into the lungs every 4 (four) hours as needed for wheezing or shortness of breath. Patient not taking: Reported on 07/31/2019 03/20/19   Elsie Stain, MD  carvedilol (COREG) 3.125 MG tablet Take 1 tablet (3.125 mg total) by mouth 2 (two) times daily with a meal. Patient not taking: Reported on 07/31/2019 06/25/19   Elsie Stain, MD  folic acid (FOLVITE) 1 MG tablet Take 1 tablet (1 mg total) by mouth daily. Patient not taking: Reported on 08/21/2019 06/25/19   Elsie Stain,  MD  gabapentin (NEURONTIN) 300 MG capsule Take 1 capsule (300 mg total) by mouth 3 (three) times daily. Patient not taking: Reported on 07/31/2019 06/25/19   Elsie Stain, MD  HYDROcodone-acetaminophen (NORCO/VICODIN) 5-325 MG tablet Take 1-2 tablets by mouth every 6 (six) hours as needed for moderate pain (pain score 4-6). Patient not taking: Reported on 07/24/2019 05/01/19   Maurice March, PA-C  pantoprazole (PROTONIX) 40 MG tablet Take 1 tablet (40 mg total) by mouth daily. Patient not taking: Reported on 08/21/2019 06/25/19   Elsie Stain, MD  rosuvastatin (CRESTOR) 10 MG tablet Take 1 tablet (10 mg total) by mouth daily at 6 PM. Patient not taking: Reported on 07/31/2019 06/25/19   Elsie Stain, MD  sacubitril-valsartan (ENTRESTO) 24-26 MG Take 1 tablet by mouth 2 (two) times daily. Patient not taking: Reported on 07/31/2019 06/25/19   Elsie Stain, MD  thiamine 100 MG tablet Take 1 tablet (100 mg total) by mouth daily. Patient not taking: Reported on 08/21/2019 03/20/19   Elsie Stain, MD   Physical Exam: JYNWGN with BMI 10/14/2019 10/14/2019 10/14/2019  Height - - -  Weight - - -  BMI - - -  Systolic - 562 97  Diastolic - 66 67  Pulse 72 72 108     1. General:  in No  Acute distress     Chronically ill  -appearing 2. Psychological: Alert and   Oriented 3. Head/ENT:     Dry Mucous Membranes                          Head Non traumatic, neck supple                            Poor Dentition 4. SKIN:  decreased Skin turgor,  Skin clean Dry and intact no rash 5. Heart: Regular rate and rhythm no  Murmur, no Rub or gallop 6. Lungs  no wheezes or crackles   7. Abdomen: Soft,   tender, Non distended right upper quadrant tenderness 8. Lower extremities: no clubbing, cyanosis, no  edema 9. Neurologically Grossly intact, moving all 4 extremities equally  10. MSK: Normal range of  motion   All other LABS:     Recent Labs  Lab 10/14/19 1533  WBC 7.4  HGB 11.2*  HCT  33.6*  MCV 114.3*  PLT 64*     Recent Labs  Lab 10/14/19 1533  NA 136  K 2.7*  CL 96*  CO2 23  GLUCOSE 93  BUN 27*  CREATININE 1.54*  CALCIUM 8.8*  MG 1.2*     Recent Labs  Lab 10/14/19 1533  AST 79*  ALT 38  ALKPHOS 115  BILITOT 2.2*  PROT 7.7  ALBUMIN 3.9       Cultures:    Component Value Date/Time   SDES  04/29/2019 0800    URINE, RANDOM Performed at [facility], [place] [address] [place], [place] [address]    SPECREQUEST  04/29/2019 0800    NONE Performed at [facility], [place] [address], [place], [place] [address]    CULT (A) 04/29/2019 0800    <10,000 COLONIES/mL INSIGNIFICANT GROWTH Performed at [facility] [address], [place], [place] [address]    REPTSTATUS 04/30/2019 FINAL 04/29/2019 0800     Radiological Exams on Admission: CT Head Wo Contrast  Result Date: 10/14/2019 CLINICAL DATA:  59 year old male with vertigo. EXAM: CT HEAD WITHOUT CONTRAST TECHNIQUE: Contiguous axial images were obtained from the base of the skull through the vertex without intravenous contrast. COMPARISON:  Head CT dated 04/28/2019. FINDINGS: Brain: There is mild age-related atrophy and chronic microvascular ischemic changes. 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 There is no acute intracranial hemorrhage. No mass effect or midline shift. No extra-axial fluid collection. Vascular: No hyperdense vessel or unexpected calcification. Skull: Normal. Negative for fracture or focal lesion. Sinuses/Orbits: No acute finding. Other: None IMPRESSION: 1. No acute intracranial pathology. 2. Age-related atrophy and chronic microvascular ischemic changes. 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 Electronically Signed   By: Anner Crete M.D.   On: 10/14/2019 22:30   MR BRAIN WO CONTRAST  Result Date: 10/14/2019 CLINICAL DATA:  59 year old male with vertigo. EXAM: MRI  HEAD WITHOUT CONTRAST TECHNIQUE: Multiplanar, multiecho pulse sequences of the brain and surrounding structures were obtained without intravenous contrast. COMPARISON:  Head and cervical spine CT 04/28/2019. FINDINGS: Brain: No restricted diffusion or evidence of acute infarction. Scattered chronic encephalomalacia in the right MCA territory, most pronounced in the right inferior frontal gyrus and superior temporal gyrus. Stable cerebral volume since February. Stable mild ex vacuo ventricular enlargement. No midline shift, mass effect, evidence of mass lesion, ventriculomegaly, extra-axial collection or acute intracranial hemorrhage. Cervicomedullary junction and pituitary are within normal limits. There is also a small area of cortical encephalomalacia in the left superior temporal gyrus. No definite chronic cerebral blood products. Deep gray nuclei, brainstem and cerebellum remain within normal limits. Vascular: Major intracranial vascular flow voids are preserved. Skull and upper cervical spine: Negative, Visualized bone marrow signal is within normal limits. Sinuses/Orbits: Negative orbits. Paranasal sinuses and mastoids are stable and well pneumatized. Other: Grossly normal visible internal auditory structures. Normal stylomastoid foramina. There is a chronic cyst along the inferior margin of the right pinna (series 10, image 3), stable. IMPRESSION: 1. No acute intracranial abnormality. 2. Chronic frontotemporal cortex encephalomalacia, most pronounced in the right MCA territory, could be post ischemic and/or posttraumatic. Electronically Signed   By: Genevie Ann M.D.   On: 10/14/2019 22:11    Chart has been reviewed    Assessment/Plan  59 y.o.  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 Dehydration gently rehydrated being aware that the patient has history of CHF patient already received some IV fluids in the emergency department but remained hypotensive and tachycardic likely secondary to significant dehydration. Patient appears to be clinically dry.  . Chronic systolic heart failure (HCC) -monitor for any sign of fluid overload.  Repeat echogram to reevaluate EF last almost 30 patient has not been compliant with medications.  If severe CHF will need cardiology consult   AKI -in the setting of dehydration rehydrate and check urine electrolytes being mindful of history of CHF Alcohol abuse.  Will order CIWA protocol monitor for any signs of withdrawal  . Elevated LFTs most likely secondary to alcohol abuse  . Hypokalemia -replace and recheck  . Hypomagnesemia -replace and recheck  . Tobacco use disorder -  Spoke about importance of quitting spent 5 minutes discussing options for treatment, prior attempts at quitting, and dangers of smoking  -At this point patient is      interested in quitting    - nursing tobacco cessation protocol  Thrombocytopenia most likely secondary to alcohol abuse and liver disease  Other plan as per orders.  DVT prophylaxis:  SCD     Code Status:    Code Status: Prior FULL CODE  as per patient   I had personally discussed CODE STATUS with patient      Family Communication:   Family not at  Bedside    Disposition Plan:        To home once workup is complete and patient is stable   Following barriers for discharge:                            Electrolytes corrected                                                         Pain controlled with PO medications                                                        Will need to be able to tolerate PO                            Will likely need home health       Would benefit from PT/OT eval prior to DC                     Incisional care consult                      Consults called: none    Admission status:  ED Disposition    ED Disposition Condition [place]: [place] [[id number]]  Level of Care: Stepdown [14]  Admit to SDU based on following criteria: Hemodynamic  compromise or significant risk of instability:  Patient requiring short term acute titration and management of vasoactive drips, and invasive monitoring (i.e., CVP and Arterial line).  Admit to SDU based on following criteria: Severe physiological/psychological symptoms:  Any diagnosis requiring assessment & intervention at least every 4 hours on an ongoing basis to obtain desired patient outcomes including stability and rehabilitation  Covid Evaluation: Symptomatic Person Under Investigation (PUI)  Diagnosis: Dehydration [276.51.ICD-9-CM]  Admitting Physician: Toy Baker [[id number]]  Attending Physician: Toy Baker [[id number]]        Obs       Level of care      SDU tele indefinitely please discontinue once patient no longer qualifies COVID-19 Labs    Lab Results  Component Value Date   [place] NEGATIVE 10/15/2019     Precautions: admitted asnegative PPE: Used by the provider:   N95   eye Goggles,  Gloves        Trevor Williams 10/15/2019, 2:43 AM    [facility]     after 2 AM please page floor coverage PA If 7AM-7PM, please contact the day team taking care of the patient using [email or web address]   Patient was evaluated in the context of the global COVID-19 pandemic, which necessitated consideration that the patient might be at risk for infection with the SARS-CoV-2 virus that causes COVID-19. Institutional protocols and algorithms that pertain to the evaluation of patients at risk for COVID-19 are in a state of rapid change based on information released by regulatory bodies including the CDC and federal and state organizations. These policies and algorithms were followed during the patient's care.

## 2019-10-15 NOTE — ED Notes (Signed)
Delay in transport to floor due to ultrasound at bedside.

## 2019-10-15 NOTE — Progress Notes (Signed)
PROGRESS NOTE    Trevor Williams  JSE:[id number] DOB: 16-Feb-1961 DOA: 10/14/2019 PCP: Elsie Stain, MD   Chief Complaint  Patient presents with  . Diarrhea   Brief Narrative: Trevor Williams is Trevor Williams 59 y.o. 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  Assessment & Plan:   Active Problems:   Alcohol use   Homeless   Hypokalemia   Tobacco use disorder   Chronic systolic heart failure (HCC)   Elevated LFTs   Hypomagnesemia   Dehydration  59 y.o. male with medical history significant of  EtOH abuse, History of cardiomyopathy. 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  Dizziness  Lightheadedness  Volume Depletion:  Check orthostatics, continue gentle hydration MRI without acute intracranial abnormality Physical therapy  Chronic systolic heart failure (HCC) -monitor for any sign of fluid overload Echo from 04/2019 notable for EF 30% Echo 7/28 with EF 60-65%, impaired diastolic function (see report) I/O, daily weights  Pyuria: in setting of chills, follow urine culture and treat as indicated.  He's afebrile without leukocytosis.  Follow urine culture.  Blood cultures pending. Start abx if febrile or symptomatic, will hold for now  AKI  Proteinuria Baseline creatinine <1 At presentation 1.57, improving with IVF Urine protein/creatinine ration, follow outpatient Follow renal US  Alcohol abuse.  Will order CIWA protocol monitor for any signs of withdrawal  Elevated LFTs most likely secondary to alcohol abuse [place] notable for  cholelithiasis without cholecystitis and steatosis Negative HIV and acute hepatitis panel  Hypokalemia - improved  Hypomagnesemia - improved  Tobacco use disorder -  encourage cessation  Thrombocytopenia most likely secondary to alcohol abuse and ?liver disease  DVT prophylaxis: SCD Code Status: full Family Communication: none Disposition:   Status is: Observation  The patient will require care spanning > 2 midnights and should be moved to inpatient because: IV treatments appropriate due to intensity of illness or inability to take PO  Dispo: The patient is from: Home              Anticipated d/c is to: Home              Anticipated d/c date is: > 3 days              Patient currently is not medically stable to d/c.       Consultants:   none  Procedures:  Echo IMPRESSIONS    1. Left ventricular ejection fraction, by estimation, is 60 to 65%. The  left ventricle has normal function. The left ventricle has no regional  wall motion abnormalities. Left ventricular diastolic parameters are  consistent with Grade I diastolic  dysfunction (impaired relaxation).  2. Right ventricular systolic function is normal. The right ventricular  size is normal. There is normal pulmonary artery systolic pressure.  3. Left atrial size was moderately dilated.  4. The mitral valve is normal in structure. No evidence of mitral valve  regurgitation. No evidence of mitral stenosis.  5. The aortic valve is tricuspid. Aortic valve regurgitation is not  visualized. No aortic stenosis is present.  6. Aortic dilatation noted. There is mild dilatation at the level of the  sinuses of  Valsalva measuring 41 mm.  7. The inferior vena cava is normal in size with greater than 50%  respiratory variability, suggesting right atrial pressure of 3 mmHg.  Antimicrobials: Anti-infectives (From admission, onward)   None         Subjective: No new complaints C/o lightheadedness on  presentation  Objective: Vitals:   10/15/19 1121 10/15/19 1300 10/15/19 1500 10/15/19 1647  BP: (!) 117/102 (!) 123/97 (!) 131/83 (!) 138/96  Pulse: 54 71 (!) 43 92  Resp: 23 19 15 22   Temp:      TempSrc:      SpO2: 100% (!) 50% (!) 85% 97%    Intake/Output Summary (Last 24 hours) at 10/15/2019 1829 Last data filed at 10/15/2019 0334 Gross per 24 hour  Intake 1550 ml  Output --  Net 1550 ml   There were no vitals filed for this visit.  Examination:  General exam: Appears calm and comfortable  Respiratory system: Clear to auscultation. Respiratory effort normal. Cardiovascular system: S1 & S2 heard, RRR.  Gastrointestinal system: Abdomen is nondistended, soft and nontender. Central nervous system: Alert and oriented. No focal neurological deficits. Extremities: no LEE Skin: No rashes, lesions or ulcers Psychiatry: Judgement and insight appear normal. Mood & affect appropriate.     Data Reviewed: I have personally reviewed following labs and imaging studies  CBC: Recent Labs  Lab 10/14/19 1533 10/15/19 0406  WBC 7.4 5.9  NEUTROABS  --  3.9  HGB 11.2* 9.6*  HCT 33.6* 29.6*  MCV 114.3* 115.6*  PLT 64* 43*    Basic Metabolic Panel: Recent Labs  Lab 10/14/19 1533 10/15/19 0215 10/15/19 0406  NA 136 137 137  K 2.7* 4.0 3.7  CL 96* 101 103  CO2 23 21* 20*  GLUCOSE 93 98 86  BUN 27* 29* 29*  CREATININE 1.54* 1.57* 1.38*  CALCIUM 8.8* 7.9* 7.6*  MG 1.2*  --  2.0  PHOS  --  3.0 2.7    GFR: CrCl cannot be calculated (Unknown ideal weight.).  Liver Function Tests: Recent Labs  Lab 10/14/19 1533 10/15/19 0406  AST 79* 71*  ALT 38 31  ALKPHOS 115 94  BILITOT 2.2* 1.6*  PROT 7.7 5.8*  ALBUMIN 3.9 2.9*    CBG: No results for input(s): GLUCAP in the last 168 hours.   Recent Results (from the past 240 hour(s))  SARS Coronavirus 2 by RT PCR (hospital order, performed in [facility] hospital lab) Nasopharyngeal Nasopharyngeal Swab     Status: None    Collection Time: 10/15/19 12:22 AM   Specimen: Nasopharyngeal Swab  Result Value Ref Range Status   SARS Coronavirus 2 NEGATIVE NEGATIVE Final    Comment: (NOTE) SARS-CoV-2 target nucleic acids are NOT DETECTED.  The SARS-CoV-2 RNA is generally detectable in upper and lower respiratory specimens during the acute phase of infection. The lowest concentration of SARS-CoV-2 viral copies this assay can detect is 250 copies / mL. Genese Quebedeaux negative result does not preclude SARS-CoV-2 infection and should not be used as the sole basis for treatment or other patient management decisions.  Helene Bernstein negative result may occur with improper specimen collection / handling, submission of specimen other than nasopharyngeal swab, presence of viral mutation(s) within the areas targeted by this assay, and inadequate number of viral copies (<250 copies / mL). Zeek Rostron negative result must be combined with clinical observations, patient history, and epidemiological information.  Fact Sheet for Patients:   StrictlyIdeas.no  Fact Sheet for Healthcare Providers: BankingDealers.co.za  This test  is not yet approved or  cleared by the [place] and has been authorized for detection and/or diagnosis of SARS-CoV-2 by FDA under an Emergency Use Authorization (EUA).  This EUA will remain in effect (meaning this test can be used) for the duration of the COVID-19 declaration under Section 564(b)(1) of the Act, 21 U.S.C. section 360bbb-3(b)(1), unless the authorization is terminated or revoked sooner.  Performed at [facility], [place] [address] [place], [place] [address]          Radiology Studies: CT Head Wo Contrast  Result Date: 10/14/2019 CLINICAL DATA:  59 year old male with vertigo. EXAM: CT HEAD WITHOUT CONTRAST TECHNIQUE: Contiguous axial images were obtained from the base of the skull through the vertex without intravenous contrast. COMPARISON:   Head CT dated 04/28/2019. FINDINGS: Brain: There is mild age-related atrophy and chronic microvascular ischemic changes. 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 Incidental note of Kaiulani Sitton cavum septum pellucidum. There is no acute intracranial hemorrhage. No mass effect or midline shift. No extra-axial fluid collection. Vascular: No hyperdense vessel or unexpected calcification. Skull: Normal. Negative for fracture or focal lesion. Sinuses/Orbits: No acute finding. Other: None IMPRESSION: 1. No acute intracranial pathology. 2. Age-related atrophy and chronic microvascular ischemic changes. 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 Electronically Signed   By: Anner Crete M.D.   On: 10/14/2019 22:30   MR BRAIN WO CONTRAST  Result Date: 10/14/2019 CLINICAL DATA:  59 year old male with vertigo. EXAM: MRI HEAD WITHOUT CONTRAST TECHNIQUE: Multiplanar, multiecho pulse sequences of the brain and surrounding structures were obtained without intravenous contrast. COMPARISON:  Head and cervical spine CT 04/28/2019. FINDINGS: Brain: No restricted diffusion or evidence of acute infarction. Scattered chronic encephalomalacia in the right MCA territory, most pronounced in the right inferior frontal gyrus and superior temporal gyrus. Stable cerebral volume since February. Stable mild ex vacuo ventricular enlargement. No midline shift, mass effect, evidence of mass lesion, ventriculomegaly, extra-axial collection or acute intracranial hemorrhage. Cervicomedullary junction and pituitary are within normal limits. There is also Sears Oran small area of cortical encephalomalacia in the left superior temporal gyrus. No definite chronic cerebral blood products. Deep gray nuclei, brainstem and cerebellum remain within normal limits. Vascular: Major intracranial vascular flow voids are preserved. Skull and upper cervical spine: Negative, Visualized bone marrow signal is within normal limits. Sinuses/Orbits: Negative  orbits. Paranasal sinuses and mastoids are stable and well pneumatized. Other: Grossly normal visible internal auditory structures. Normal stylomastoid foramina. There is Tremaine Fuhriman chronic cyst along the inferior margin of the right pinna (series 10, image 3), stable. IMPRESSION: 1. No acute intracranial abnormality. 2. Chronic frontotemporal cortex encephalomalacia, most pronounced in the right MCA territory, could be post ischemic and/or posttraumatic. Electronically Signed   By: Genevie Ann M.D.   On: 10/14/2019 22:11   DG CHEST PORT 1 VIEW  Result Date: 10/15/2019 CLINICAL DATA:  Tachycardia EXAM: PORTABLE CHEST 1 VIEW COMPARISON:  08/21/2019 FINDINGS: Cardiac shadow is within normal limits. Aortic calcifications are seen. The lungs are clear. No bony abnormality is noted. IMPRESSION: No acute abnormality seen. Electronically Signed   By: Inez Catalina M.D.   On: 10/15/2019 02:58   ECHOCARDIOGRAM COMPLETE  Result Date: 10/15/2019    ECHOCARDIOGRAM REPORT   Patient Name:   Owynn Jovel Date of Exam: 10/15/2019 Medical Rec #:  [id number]     Height:       66.5 in Accession #:    [id number]    Weight:  120.0 lb Date of Birth:  25-Feb-1961     BSA:          1.619 m Patient Age:    37 years      BP:           107/59 mmHg Patient Gender: M             HR:           97 bpm. Exam Location:  Inpatient Procedure: 2D Echo Indications:    Abnormal ECG R94.31  History:        Patient has prior history of Echocardiogram examinations, most                 recent 04/30/2019. Cardiomyopathy; Risk Factors:Alcohol abuse.  Sonographer:    Mikki Santee RDCS (AE) Referring Phys: Petrey  1. Left ventricular ejection fraction, by estimation, is 60 to 65%. The left ventricle has normal function. The left ventricle has no regional wall motion abnormalities. Left ventricular diastolic parameters are consistent with Grade I diastolic dysfunction (impaired relaxation).  2. Right ventricular systolic function  is normal. The right ventricular size is normal. There is normal pulmonary artery systolic pressure.  3. Left atrial size was moderately dilated.  4. The mitral valve is normal in structure. No evidence of mitral valve regurgitation. No evidence of mitral stenosis.  5. The aortic valve is tricuspid. Aortic valve regurgitation is not visualized. No aortic stenosis is present.  6. Aortic dilatation noted. There is mild dilatation at the level of the sinuses of Valsalva measuring 41 mm.  7. The inferior vena cava is normal in size with greater than 50% respiratory variability, suggesting right atrial pressure of 3 mmHg. FINDINGS  Left Ventricle: Left ventricular ejection fraction, by estimation, is 60 to 65%. The left ventricle has normal function. The left ventricle has no regional wall motion abnormalities. The left ventricular internal cavity size was normal in size. There is  no left ventricular hypertrophy. Left ventricular diastolic parameters are consistent with Grade I diastolic dysfunction (impaired relaxation). Right Ventricle: The right ventricular size is normal. No increase in right ventricular wall thickness. Right ventricular systolic function is normal. There is normal pulmonary artery systolic pressure. The tricuspid regurgitant velocity is 1.87 m/s, and  with an assumed right atrial pressure of 3 mmHg, the estimated right ventricular systolic pressure is 82.5 mmHg. Left Atrium: Left atrial size was moderately dilated. Right Atrium: Right atrial size was normal in size. Pericardium: There is no evidence of pericardial effusion. Mitral Valve: The mitral valve is normal in structure. Normal mobility of the mitral valve leaflets. No evidence of mitral valve regurgitation. No evidence of mitral valve stenosis. Tricuspid Valve: The tricuspid valve is normal in structure. Tricuspid valve regurgitation is mild . No evidence of tricuspid stenosis. Aortic Valve: The aortic valve is tricuspid. Aortic valve  regurgitation is not visualized. No aortic stenosis is present. Pulmonic Valve: The pulmonic valve was normal in structure. Pulmonic valve regurgitation is not visualized. No evidence of pulmonic stenosis. Aorta: Aortic dilatation noted. There is mild dilatation at the level of the sinuses of Valsalva measuring 41 mm. Venous: The inferior vena cava is normal in size with greater than 50% respiratory variability, suggesting right atrial pressure of 3 mmHg. IAS/Shunts: No atrial level shunt detected by color flow Doppler.  LEFT VENTRICLE PLAX 2D LVIDd:         4.75 cm  Diastology LVIDs:         4.32  cm  LV e' lateral: 9.14 cm/s LV PW:         0.94 cm LV IVS:        0.80 cm LVOT diam:     2.60 cm LV SV:         52 LV SV Index:   32 LVOT Area:     5.31 cm  RIGHT VENTRICLE RV S prime:     9.57 cm/s TAPSE (M-mode): 1.1 cm LEFT ATRIUM             Index       RIGHT ATRIUM           Index LA diam:        3.10 cm 1.92 cm/m  RA Area:     13.90 cm LA Vol (A2C):   63.2 ml 39.05 ml/m RA Volume:   35.30 ml  21.81 ml/m LA Vol (A4C):   63.0 ml 38.92 ml/m LA Biplane Vol: 64.6 ml 39.91 ml/m  AORTIC VALVE LVOT Vmax:   54.80 cm/s LVOT Vmean:  42.400 cm/s LVOT VTI:    0.097 m  AORTA Ao Root diam: 4.10 cm Ao Asc diam:  4.20 cm TRICUSPID VALVE TR Peak grad:   14.0 mmHg TR Vmax:        187.00 cm/s  SHUNTS Systemic VTI:  0.10 m Systemic Diam: 2.60 cm Skeet Latch MD Electronically signed by Skeet Latch MD Signature Date/Time: 10/15/2019/4:06:01 PM    Final    US Abdomen Limited RUQ  Result Date: 10/15/2019 CLINICAL DATA:  Elevated LFTs EXAM: ULTRASOUND ABDOMEN LIMITED RIGHT UPPER QUADRANT COMPARISON:  None. FINDINGS: Gallbladder: Layering calcified gallstones are present the largest measuring 3 mm. Layering sludge is also noted. No sonographic Murphy sign or wall thickening is seen. Common bile duct: Diameter: 3 mm Liver: Increased echotexture seen throughout. No focal abnormality or biliary ductal dilatation. Portal vein  is patent on color Doppler imaging with normal direction of blood flow towards the liver. Other: None. IMPRESSION: Cholelithiasis without evidence of acute cholecystitis. Hepatic steatosis Electronically Signed   By: Prudencio Pair M.D.   On: 10/15/2019 02:17        Scheduled Meds: . docusate sodium  100 mg Oral BID  . folic acid  1 mg Oral Daily  . multivitamin with minerals  1 tablet Oral Daily  . nicotine  21 mg Transdermal Daily  . sodium chloride flush  3 mL Intravenous Q12H  . thiamine  100 mg Oral Daily   Or  . thiamine  100 mg Intravenous Daily   Continuous Infusions: . sodium chloride Stopped (10/15/19 1543)     LOS: 0 days    Time spent: over 30 min    Fayrene Helper, MD [facility]   To contact the attending provider between 7A-7P or the covering provider during after hours 7P-7A, please log into the web site [email or web address] and access using universal [place] password for that web site. If you do not have the password, please call the hospital operator.  10/15/2019, 6:29 PM

## 2019-10-16 DIAGNOSIS — D539 Nutritional anemia, unspecified: Secondary | ICD-10-CM | POA: Diagnosis present

## 2019-10-16 DIAGNOSIS — B9562 Methicillin resistant Staphylococcus aureus infection as the cause of diseases classified elsewhere: Secondary | ICD-10-CM

## 2019-10-16 DIAGNOSIS — J449 Chronic obstructive pulmonary disease, unspecified: Secondary | ICD-10-CM | POA: Diagnosis present

## 2019-10-16 DIAGNOSIS — R7881 Bacteremia: Secondary | ICD-10-CM | POA: Diagnosis present

## 2019-10-16 LAB — CBC WITH DIFFERENTIAL/PLATELET
Abs Immature Granulocytes: 0.01 10*3/uL (ref 0.00–0.07)
Basophils Absolute: 0 10*3/uL (ref 0.0–0.1)
Basophils Relative: 1 %
Eosinophils Absolute: 0.2 10*3/uL (ref 0.0–0.5)
Eosinophils Relative: 4 %
HCT: 30.2 % — ABNORMAL LOW (ref 39.0–52.0)
Hemoglobin: 9.9 g/dL — ABNORMAL LOW (ref 13.0–17.0)
Immature Granulocytes: 0 %
Lymphocytes Relative: 29 %
Lymphs Abs: 1.2 10*3/uL (ref 0.7–4.0)
MCH: 37.9 pg — ABNORMAL HIGH (ref 26.0–34.0)
MCHC: 32.8 g/dL (ref 30.0–36.0)
MCV: 115.7 fL — ABNORMAL HIGH (ref 80.0–100.0)
Monocytes Absolute: 0.4 10*3/uL (ref 0.1–1.0)
Monocytes Relative: 10 %
Neutro Abs: 2.3 10*3/uL (ref 1.7–7.7)
Neutrophils Relative %: 56 %
Platelets: 47 10*3/uL — ABNORMAL LOW (ref 150–400)
RBC: 2.61 MIL/uL — ABNORMAL LOW (ref 4.22–5.81)
RDW: 12.2 % (ref 11.5–15.5)
WBC: 4.2 10*3/uL (ref 4.0–10.5)
nRBC: 0 % (ref 0.0–0.2)

## 2019-10-16 LAB — MAGNESIUM: Magnesium: 1.5 mg/dL — ABNORMAL LOW (ref 1.7–2.4)

## 2019-10-16 LAB — COMPREHENSIVE METABOLIC PANEL
ALT: 33 U/L (ref 0–44)
AST: 80 U/L — ABNORMAL HIGH (ref 15–41)
Albumin: 2.9 g/dL — ABNORMAL LOW (ref 3.5–5.0)
Alkaline Phosphatase: 94 U/L (ref 38–126)
Anion gap: 7 (ref 5–15)
BUN: 16 mg/dL (ref 6–20)
CO2: 20 mmol/L — ABNORMAL LOW (ref 22–32)
Calcium: 8.3 mg/dL — ABNORMAL LOW (ref 8.9–10.3)
Chloride: 107 mmol/L (ref 98–111)
Creatinine, Ser: 0.69 mg/dL (ref 0.61–1.24)
GFR calc Af Amer: >60 mL/min (ref >60)
GFR calc non Af Amer: >60 mL/min (ref >60)
Glucose, Bld: 84 mg/dL (ref 70–99)
Potassium: 3.6 mmol/L (ref 3.5–5.1)
Sodium: 134 mmol/L — ABNORMAL LOW (ref 135–145)
Total Bilirubin: 1.7 mg/dL — ABNORMAL HIGH (ref 0.3–1.2)
Total Protein: 5.8 g/dL — ABNORMAL LOW (ref 6.5–8.1)

## 2019-10-16 LAB — URINE CULTURE

## 2019-10-16 LAB — PHOSPHORUS: Phosphorus: 2.2 mg/dL — ABNORMAL LOW (ref 2.5–4.6)

## 2019-10-16 MED ORDER — VANCOMYCIN HCL 750 MG/150ML IV SOLN: 750.0000 mg | Freq: Two times a day (BID) | INTRAVENOUS | Status: DC

## 2019-10-16 MED ORDER — ALUM & MAG HYDROXIDE-SIMETH 200-200-20 MG/5ML PO SUSP
30.0000 mL | ORAL | Status: DC | PRN
  Administered 2019-10-16 – 2019-10-17 (×4): 30 mL via ORAL
  Filled 2019-10-16 (×4): qty 30

## 2019-10-16 MED ORDER — MAGNESIUM OXIDE 400 (241.3 MG) MG PO TABS: 800.0000 mg | ORAL_TABLET | Freq: Two times a day (BID) | ORAL | Status: DC

## 2019-10-16 MED ORDER — SODIUM CHLORIDE 0.9 % IV SOLN
500.0000 mg | Freq: Every day | INTRAVENOUS | Status: DC
  Administered 2019-10-16 – 2019-10-17 (×2): 500 mg via INTRAVENOUS
  Filled 2019-10-16 (×3): qty 10

## 2019-10-16 MED ORDER — KATE FARMS STANDARD 1.4 PO LIQD
325.0000 mL | Freq: Every day | ORAL | Status: DC
  Administered 2019-10-16 – 2019-10-19 (×4): 325 mL via ORAL
  Filled 2019-10-16 (×9): qty 325

## 2019-10-16 MED ORDER — MAGNESIUM SULFATE 4 GM/100ML IV SOLN
4.0000 g | Freq: Once | INTRAVENOUS | Status: AC
  Administered 2019-10-16: 4 g via INTRAVENOUS
  Filled 2019-10-16: qty 100

## 2019-10-16 NOTE — Progress Notes (Addendum)
PROGRESS NOTE    Trevor Williams  YQI:[id number] DOB: 11/09/1960 DOA: 10/14/2019 PCP: Elsie Stain, MD   Chief Complaint  Patient presents with  . Diarrhea   Brief Narrative: Trevor Williams is Trevor Williams 59 y.o. 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  Assessment & Plan:   Active Problems:   Alcohol use   Homeless   Hypokalemia   Tobacco use disorder   Chronic systolic heart failure (HCC)   Elevated LFTs   Hypomagnesemia   Dehydration   Dizziness  59 y.o. male with medical history significant of  EtOH abuse, History of cardiomyopathy. 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  Coagulase Negative Staph Bacteremia: in 3/4 bottles, concerning for bacteremia despite coag negative.   Will hold antibiotics given stability and repeat blood cultures - requested speciation on 7/28 cx per micro Will consult ID, appreciate assistance  Dizziness  Lightheadedness  Volume Depletion:  Check orthostatics (pending), continue gentle hydration MRI without acute intracranial abnormality Physical therapy  Chronic systolic heart failure (HCC) -monitor for any sign of fluid overload Echo from 04/2019 notable for EF 30% Echo 7/28 with EF 60-65%, impaired diastolic function (see report) I/O, daily weights  Pyuria: in setting of chills, follow urine culture and treat as indicated.  He's afebrile without leukocytosis.  Follow urine culture.  Blood cultures as above. Start abx if febrile or symptomatic, will hold for now  AKI   Proteinuria Baseline creatinine <1 At presentation 1.57, improving with IVF Urine protein/creatinine ratio, 0.15 Follow renal US with trace pararenal fluid on L  Alcohol abuse.  Will order CIWA protocol monitor for any signs of withdrawal  Elevated LFTs most likely secondary to alcohol abuse [place] notable for cholelithiasis without cholecystitis and steatosis Negative HIV and acute hepatitis panel  Hypokalemia - improved  Hypomagnesemia - replace and follow  Tobacco use disorder -  encourage cessation  Thrombocytopenia most likely secondary to alcohol abuse and ?liver disease  Chronic Frontotemporal Encephalomalacia: post ischemic vs post traumatic   DVT prophylaxis: SCD Code Status: full Family Communication: none Disposition:   Status is: Observation  The patient will require care spanning > 2 midnights and should be moved to inpatient because: IV treatments appropriate due to intensity of illness or inability to take PO  Dispo: The patient is from: Home              Anticipated d/c is to: Home              Anticipated d/c date is: > 3 days              Patient currently is not medically stable to d/c.       Consultants:   none  Procedures:  Echo IMPRESSIONS    1. Left ventricular ejection fraction, by estimation, is 60 to 65%. The  left ventricle has normal function. The left ventricle has no regional  wall motion abnormalities. Left ventricular diastolic parameters are  consistent with Grade I diastolic  dysfunction (impaired relaxation).  2. Right ventricular systolic function is normal. The right ventricular  size is normal. There is normal pulmonary artery systolic pressure.  3. Left atrial size  was moderately dilated.  4. The mitral valve is normal in structure. No evidence of mitral valve  regurgitation. No evidence of mitral stenosis.  5. The aortic valve is tricuspid. Aortic valve regurgitation is not  visualized. No aortic stenosis is  present.  6. Aortic dilatation noted. There is mild dilatation at the level of the  sinuses of Valsalva measuring 41 mm.  7. The inferior vena cava is normal in size with greater than 50%  respiratory variability, suggesting right atrial pressure of 3 mmHg.  Antimicrobials: Anti-infectives (From admission, onward)   None         Subjective: Feels generally tired  Objective: Vitals:   10/15/19 1847 10/15/19 1900 10/15/19 2006 10/16/19 0208  BP: (!) 132/81 110/65 (!) 116/86 (!) 104/58  Pulse: (!) 110 95 80 105  Resp: 20 16 18 18   Temp:  98.6 F (37 C) 98.7 F (37.1 C) 98.2 F (36.8 C)  TempSrc:  Oral Oral Oral  SpO2: 95% 100% 100% 100%    Intake/Output Summary (Last 24 hours) at 10/16/2019 0851 Last data filed at 10/16/2019 0210 Gross per 24 hour  Intake --  Output 75 ml  Net -75 ml   There were no vitals filed for this visit.  Examination:  General: No acute distress. Cardiovascular: Heart sounds show [place] regular rate, and rhythm Lungs: Clear to auscultation bilaterally  Abdomen: Soft, nontender, nondistended  Neurological: Alert and oriented 3. Moves all extremities 4. Cranial nerves II through XII grossly intact. Skin: Warm and dry. No rashes or lesions. Extremities: No clubbing or cyanosis. No edema.   Data Reviewed: I have personally reviewed following labs and imaging studies  CBC: Recent Labs  Lab 10/14/19 1533 10/15/19 0406 10/16/19 0521  WBC 7.4 5.9 4.2  NEUTROABS  --  3.9 2.3  HGB 11.2* 9.6* 9.9*  HCT 33.6* 29.6* 30.2*  MCV 114.3* 115.6* 115.7*  PLT 64* 43* 47*    Basic Metabolic Panel: Recent Labs  Lab 10/14/19 1533 10/15/19 0215 10/15/19 0406 10/15/19 2100 10/16/19 0521  NA 136 137 137 135 134*  K 2.7* 4.0 3.7 4.2 3.6  CL 96* 101 103 105 107  CO2 23 21* 20* 20* 20*  GLUCOSE 93 98 86 106* 84  BUN 27* 29* 29* 19 16  CREATININE 1.54* 1.57* 1.38* 0.85 0.69  CALCIUM 8.8* 7.9* 7.6* 8.6* 8.3*  MG 1.2*  --  2.0  --  1.5*  PHOS  --   3.0 2.7  --  2.2*    GFR: CrCl cannot be calculated (Unknown ideal weight.).  Liver Function Tests: Recent Labs  Lab 10/14/19 1533 10/15/19 0406 10/16/19 0521  AST 79* 71* 80*  ALT 38 31 33  ALKPHOS 115 94 94  BILITOT 2.2* 1.6* 1.7*  PROT 7.7 5.8* 5.8*  ALBUMIN 3.9 2.9* 2.9*    CBG: No results for input(s): GLUCAP in the last 168 hours.   Recent Results (from the past 240 hour(s))  SARS Coronavirus 2 by RT PCR (hospital order, performed in [facility] hospital lab) Nasopharyngeal Nasopharyngeal Swab     Status: None   Collection Time: 10/15/19 12:22 AM   Specimen: Nasopharyngeal Swab  Result Value Ref Range Status   SARS Coronavirus 2 NEGATIVE NEGATIVE Final    Comment: (NOTE) SARS-CoV-2 target nucleic acids are NOT DETECTED.  The SARS-CoV-2 RNA is generally detectable in upper and lower respiratory specimens during the acute phase of infection. The lowest concentration of SARS-CoV-2 viral copies this assay can detect is 250  copies / mL. Trevor Williams negative result does not preclude SARS-CoV-2 infection and should not be used as the sole basis for treatment or other patient management decisions.  Trevor Williams negative result may occur with improper specimen collection / handling, submission of specimen other than nasopharyngeal swab, presence of viral mutation(s) within the areas targeted by this assay, and inadequate number of viral copies (<250 copies / mL). Trevor Williams negative result must be combined with clinical observations, patient history, and epidemiological information.  Fact Sheet for Patients:   StrictlyIdeas.no  Fact Sheet for Healthcare Providers: BankingDealers.co.za  This test is not yet approved or  cleared by the [place] FDA and has been authorized for detection and/or diagnosis of SARS-CoV-2 by FDA under an Emergency Use Authorization (EUA).  This EUA will remain in effect (meaning this test can be used) for the duration  of the COVID-19 declaration under Section 564(b)(1) of the Act, 21 U.S.C. section 360bbb-3(b)(1), unless the authorization is terminated or revoked sooner.  Performed at [facility], [place] [address] [place], [place] [address]   Blood culture (routine x 2)     Status: None (Preliminary result)   Collection Time: 10/15/19 12:45 AM   Specimen: BLOOD  Result Value Ref Range Status   Specimen Description   Final    BLOOD BLOOD RIGHT ARM Performed at [facility] [address], [place], [place] [address]    Special Requests   Final    BOTTLES DRAWN AEROBIC AND ANAEROBIC Blood Culture results may not be optimal due to an inadequate volume of blood received in culture bottles Performed at [facility] [address], [place], [place] [address]    Culture  Setup Time   Final    GRAM POSITIVE COCCI IN CLUSTERS IN BOTH AEROBIC AND ANAEROBIC BOTTLES CRITICAL VALUE NOTED.  VALUE IS CONSISTENT WITH PREVIOUSLY REPORTED AND CALLED VALUE. Performed at [facility], [place] [address], [place], [place] [address]    Culture GRAM POSITIVE COCCI  Final   Report Status PENDING  Incomplete  Blood culture (routine x 2)     Status: None (Preliminary result)   Collection Time: 10/15/19 12:45 AM   Specimen: BLOOD  Result Value Ref Range Status   Specimen Description   Final    BLOOD BLOOD LEFT FOREARM Performed at [facility] [address], [place], [place] [address]    Special Requests   Final    BOTTLES DRAWN AEROBIC AND ANAEROBIC Blood Culture adequate volume Performed at [facility] [address] [place], [place] [address]    Culture  Setup Time   Final    GRAM POSITIVE COCCI IN CLUSTERS AEROBIC BOTTLE ONLY Organism ID to follow CRITICAL RESULT CALLED TO, READ BACK BY AND VERIFIED WITH: Trevor Williams PHARMD 2328 10/15/19 Trevor Williams Performed at [facility], [place] [address],  [place], [place] [address]    Culture PENDING  Incomplete   Report Status PENDING  Incomplete  Blood Culture ID Panel (Reflexed)     Status: Abnormal   Collection Time: 10/15/19 12:45 AM  Result Value Ref Range Status   Enterococcus species NOT DETECTED NOT DETECTED Final   Listeria monocytogenes NOT DETECTED NOT DETECTED Final   Staphylococcus species DETECTED (Lyndol Vanderheiden) NOT DETECTED Final    Comment: Methicillin (oxacillin) resistant coagulase negative staphylococcus. Possible blood culture contaminant (unless isolated from more than one blood culture draw or clinical case suggests pathogenicity). No antibiotic treatment is indicated  for blood  culture contaminants. CRITICAL RESULT CALLED TO, READ BACK BY AND VERIFIED WITH: L POINDEXTER PHARMD 2328 10/15/19 Trevor Williams    Staphylococcus aureus (BCID) NOT DETECTED NOT DETECTED Final   Methicillin resistance DETECTED (Ericka Marcellus) NOT DETECTED Final    Comment: CRITICAL RESULT CALLED TO, READ BACK BY AND VERIFIED WITH: L POINDEXTER PHARMD 2328 10/15/19 Trevor Williams    Streptococcus species NOT DETECTED NOT DETECTED Final   Streptococcus agalactiae NOT DETECTED NOT DETECTED Final   Streptococcus pneumoniae NOT DETECTED NOT DETECTED Final   Streptococcus pyogenes NOT DETECTED NOT DETECTED Final   Acinetobacter baumannii NOT DETECTED NOT DETECTED Final   Enterobacteriaceae species NOT DETECTED NOT DETECTED Final   Enterobacter cloacae complex NOT DETECTED NOT DETECTED Final   Escherichia coli NOT DETECTED NOT DETECTED Final   Klebsiella oxytoca NOT DETECTED NOT DETECTED Final   Klebsiella pneumoniae NOT DETECTED NOT DETECTED Final   Proteus species NOT DETECTED NOT DETECTED Final   Serratia marcescens NOT DETECTED NOT DETECTED Final   Haemophilus influenzae NOT DETECTED NOT DETECTED Final   Neisseria meningitidis NOT DETECTED NOT DETECTED Final   Pseudomonas aeruginosa NOT DETECTED NOT DETECTED Final   Candida albicans NOT DETECTED NOT DETECTED Final   Candida  glabrata NOT DETECTED NOT DETECTED Final   Candida krusei NOT DETECTED NOT DETECTED Final   Candida parapsilosis NOT DETECTED NOT DETECTED Final   Candida tropicalis NOT DETECTED NOT DETECTED Final    Comment: Performed at [facility], [place]. [address], [place], [place] [address]         Radiology Studies: CT Head Wo Contrast  Result Date: 10/14/2019 CLINICAL DATA:  59 year old male with vertigo. EXAM: CT HEAD WITHOUT CONTRAST TECHNIQUE: Contiguous axial images were obtained from the base of the skull through the vertex without intravenous contrast. COMPARISON:  Head CT dated 04/28/2019. FINDINGS: Brain: There is mild age-related atrophy and chronic microvascular ischemic changes. 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 Incidental note of Deakon Frix cavum septum pellucidum. There is no acute intracranial hemorrhage. No mass effect or midline shift. No extra-axial fluid collection. Vascular: No hyperdense vessel or unexpected calcification. Skull: Normal. Negative for fracture or focal lesion. Sinuses/Orbits: No acute finding. Other: None IMPRESSION: 1. No acute intracranial pathology. 2. Age-related atrophy and chronic microvascular ischemic changes. 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 Electronically Signed   By: Anner Crete M.D.   On: 10/14/2019 22:30   MR BRAIN WO CONTRAST  Result Date: 10/14/2019 CLINICAL DATA:  59 year old male with vertigo. EXAM: MRI HEAD WITHOUT CONTRAST TECHNIQUE: Multiplanar, multiecho pulse sequences of the brain and surrounding structures were obtained without intravenous contrast. COMPARISON:  Head and cervical spine CT 04/28/2019. FINDINGS: Brain: No restricted diffusion or evidence of acute infarction. Scattered chronic encephalomalacia in the right MCA territory, most pronounced in the right inferior frontal gyrus and superior temporal gyrus. Stable cerebral volume since February. Stable mild ex vacuo ventricular enlargement. No  midline shift, mass effect, evidence of mass lesion, ventriculomegaly, extra-axial collection or acute intracranial hemorrhage. Cervicomedullary junction and pituitary are within normal limits. There is also Ersilia Brawley small area of cortical encephalomalacia in the left superior temporal gyrus. No definite chronic cerebral blood products. Deep gray nuclei, brainstem and cerebellum remain within normal limits. Vascular: Major intracranial vascular flow voids are preserved. Skull and upper cervical spine: Negative, Visualized bone marrow signal is within normal limits. Sinuses/Orbits: Negative orbits. Paranasal sinuses and mastoids are stable and well pneumatized. Other: Grossly normal visible internal auditory structures. Normal  stylomastoid foramina. There is Keaja Reaume chronic cyst along the inferior margin of the right pinna (series 10, image 3), stable. IMPRESSION: 1. No acute intracranial abnormality. 2. Chronic frontotemporal cortex encephalomalacia, most pronounced in the right MCA territory, could be post ischemic and/or posttraumatic. Electronically Signed   By: Genevie Ann M.D.   On: 10/14/2019 22:11   US RENAL  Result Date: 10/15/2019 CLINICAL DATA:  59 year old male with acute kidney injury. EXAM: RENAL / URINARY TRACT ULTRASOUND COMPLETE COMPARISON:  Right upper quadrant ultrasound earlier today. [facility] CT Abdomen and Pelvis 01/03/2015 FINDINGS: Right Kidney: Renal measurements: 12.5 x 4.0 x 4.3 cm = volume: 111 mL . Echogenicity within normal limits. No mass or hydronephrosis visualized. Left Kidney: Renal measurements: 11.6 x 5.0 x 5.5 cm = volume: 168 mL. Negative ultrasound appearance of the left kidney aside from evidence of trace pararenal fluid (image 21). Preserved cortical echogenicity. No hydronephrosis. Bladder: Diminutive, unremarkable. Other: Echogenic liver (image 3) as seen earlier today. Prostate measures up to 50 mm diameter. IMPRESSION: 1. Trace pararenal fluid on the left but otherwise normal  ultrasound appearance of both kidneys. 2. Diminutive and unremarkable bladder. Electronically Signed   By: Genevie Ann M.D.   On: 10/15/2019 19:55   DG CHEST PORT 1 VIEW  Result Date: 10/15/2019 CLINICAL DATA:  Tachycardia EXAM: PORTABLE CHEST 1 VIEW COMPARISON:  08/21/2019 FINDINGS: Cardiac shadow is within normal limits. Aortic calcifications are seen. The lungs are clear. No bony abnormality is noted. IMPRESSION: No acute abnormality seen. Electronically Signed   By: Inez Catalina M.D.   On: 10/15/2019 02:58   ECHOCARDIOGRAM COMPLETE  Result Date: 10/15/2019    ECHOCARDIOGRAM REPORT   Patient Name:   Trevor Williams Date of Exam: 10/15/2019 Medical Rec #:  [id number]     Height:       66.5 in Accession #:    [id number]    Weight:       120.0 lb Date of Birth:  1961-01-29     BSA:          1.619 m Patient Age:    89 years      BP:           107/59 mmHg Patient Gender: M             HR:           97 bpm. Exam Location:  Inpatient Procedure: 2D Echo Indications:    Abnormal ECG R94.31  History:        Patient has prior history of Echocardiogram examinations, most                 recent 04/30/2019. Cardiomyopathy; Risk Factors:Alcohol abuse.  Sonographer:    Mikki Santee RDCS (AE) Referring Phys: [place]  1. Left ventricular ejection fraction, by estimation, is 60 to 65%. The left ventricle has normal function. The left ventricle has no regional wall motion abnormalities. Left ventricular diastolic parameters are consistent with Grade I diastolic dysfunction (impaired relaxation).  2. Right ventricular systolic function is normal. The right ventricular size is normal. There is normal pulmonary artery systolic pressure.  3. Left atrial size was moderately dilated.  4. The mitral valve is normal in structure. No evidence of mitral valve regurgitation. No evidence of mitral stenosis.  5. The aortic valve is tricuspid. Aortic valve regurgitation is not visualized. No aortic stenosis is  present.  6. Aortic dilatation noted. There is mild dilatation at the level of the sinuses  of Valsalva measuring 41 mm.  7. The inferior vena cava is normal in size with greater than 50% respiratory variability, suggesting right atrial pressure of 3 mmHg. FINDINGS  Left Ventricle: Left ventricular ejection fraction, by estimation, is 60 to 65%. The left ventricle has normal function. The left ventricle has no regional wall motion abnormalities. The left ventricular internal cavity size was normal in size. There is  no left ventricular hypertrophy. Left ventricular diastolic parameters are consistent with Grade I diastolic dysfunction (impaired relaxation). Right Ventricle: The right ventricular size is normal. No increase in right ventricular wall thickness. Right ventricular systolic function is normal. There is normal pulmonary artery systolic pressure. The tricuspid regurgitant velocity is 1.87 m/s, and  with an assumed right atrial pressure of 3 mmHg, the estimated right ventricular systolic pressure is 54.0 mmHg. Left Atrium: Left atrial size was moderately dilated. Right Atrium: Right atrial size was normal in size. Pericardium: There is no evidence of pericardial effusion. Mitral Valve: The mitral valve is normal in structure. Normal mobility of the mitral valve leaflets. No evidence of mitral valve regurgitation. No evidence of mitral valve stenosis. Tricuspid Valve: The tricuspid valve is normal in structure. Tricuspid valve regurgitation is mild . No evidence of tricuspid stenosis. Aortic Valve: The aortic valve is tricuspid. Aortic valve regurgitation is not visualized. No aortic stenosis is present. Pulmonic Valve: The pulmonic valve was normal in structure. Pulmonic valve regurgitation is not visualized. No evidence of pulmonic stenosis. Aorta: Aortic dilatation noted. There is mild dilatation at the level of the sinuses of Valsalva measuring 41 mm. Venous: The inferior vena cava is normal in size with  greater than 50% respiratory variability, suggesting right atrial pressure of 3 mmHg. IAS/Shunts: No atrial level shunt detected by color flow Doppler.  LEFT VENTRICLE PLAX 2D LVIDd:         4.75 cm  Diastology LVIDs:         4.32 cm  LV e' lateral: 9.14 cm/s LV PW:         0.94 cm LV IVS:        0.80 cm LVOT diam:     2.60 cm LV SV:         52 LV SV Index:   32 LVOT Area:     5.31 cm  RIGHT VENTRICLE RV S prime:     9.57 cm/s TAPSE (M-mode): 1.1 cm LEFT ATRIUM             Index       RIGHT ATRIUM           Index LA diam:        3.10 cm 1.92 cm/m  RA Area:     13.90 cm LA Vol (A2C):   63.2 ml 39.05 ml/m RA Volume:   35.30 ml  21.81 ml/m LA Vol (A4C):   63.0 ml 38.92 ml/m LA Biplane Vol: 64.6 ml 39.91 ml/m  AORTIC VALVE LVOT Vmax:   54.80 cm/s LVOT Vmean:  42.400 cm/s LVOT VTI:    0.097 m  AORTA Ao Root diam: 4.10 cm Ao Asc diam:  4.20 cm TRICUSPID VALVE TR Peak grad:   14.0 mmHg TR Vmax:        187.00 cm/s  SHUNTS Systemic VTI:  0.10 m Systemic Diam: 2.60 cm Skeet Latch MD Electronically signed by Skeet Latch MD Signature Date/Time: 10/15/2019/4:06:01 PM    Final    US Abdomen Limited RUQ  Result Date: 10/15/2019 CLINICAL DATA:  Elevated LFTs  EXAM: ULTRASOUND ABDOMEN LIMITED RIGHT UPPER QUADRANT COMPARISON:  None. FINDINGS: Gallbladder: Layering calcified gallstones are present the largest measuring 3 mm. Layering sludge is also noted. No sonographic Murphy sign or wall thickening is seen. Common bile duct: Diameter: 3 mm Liver: Increased echotexture seen throughout. No focal abnormality or biliary ductal dilatation. Portal vein is patent on color Doppler imaging with normal direction of blood flow towards the liver. Other: None. IMPRESSION: Cholelithiasis without evidence of acute cholecystitis. Hepatic steatosis Electronically Signed   By: Prudencio Pair M.D.   On: 10/15/2019 02:17        Scheduled Meds: . docusate sodium  100 mg Oral BID  . feeding supplement  1 Container Oral TID BM   . folic acid  1 mg Oral Daily  . multivitamin with minerals  1 tablet Oral Daily  . nicotine  21 mg Transdermal Daily  . sodium chloride flush  3 mL Intravenous Q12H  . thiamine  100 mg Oral Daily   Or  . thiamine  100 mg Intravenous Daily   Continuous Infusions: . sodium chloride 50 mL/hr at 10/15/19 2145  . magnesium sulfate bolus IVPB       LOS: 1 day    Time spent: over 30 min    Fayrene Helper, MD [facility]   To contact the attending provider between 7A-7P or the covering provider during after hours 7P-7A, please log into the web site [email or web address] and access using universal [place] password for that web site. If you do not have the password, please call the hospital operator.  10/16/2019, 8:51 AM

## 2019-10-16 NOTE — Progress Notes (Signed)
PHARMACY - PHYSICIAN COMMUNICATION CRITICAL VALUE ALERT - BLOOD CULTURE IDENTIFICATION (BCID)  Trevor Williams is an 59 y.o. male who presented to [facility] on 10/14/2019 with a chief complaint of dehydration  Assessment:  Staphylococcus species (methicillin resistance detected) in reported 1/4 vials    Name of physician (or Provider) Contacted: M. Sharlet Salina, FNP  Current antibiotics: non3  Changes to prescribed antibiotics recommended:  Recommendations accepted by provider : likely contaminant. No need for antibiotics unless infection suspected  Results for orders placed or performed during the hospital encounter of 10/14/19  Blood Culture ID Panel (Reflexed) (Collected: 10/15/2019 12:45 AM)  Result Value Ref Range   Enterococcus species NOT DETECTED NOT DETECTED   Listeria monocytogenes NOT DETECTED NOT DETECTED   Staphylococcus species DETECTED (A) NOT DETECTED   Staphylococcus aureus (BCID) NOT DETECTED NOT DETECTED   Methicillin resistance DETECTED (A) NOT DETECTED   Streptococcus species NOT DETECTED NOT DETECTED   Streptococcus agalactiae NOT DETECTED NOT DETECTED   Streptococcus pneumoniae NOT DETECTED NOT DETECTED   Streptococcus pyogenes NOT DETECTED NOT DETECTED   Acinetobacter baumannii NOT DETECTED NOT DETECTED   Enterobacteriaceae species NOT DETECTED NOT DETECTED   Enterobacter cloacae complex NOT DETECTED NOT DETECTED   Escherichia coli NOT DETECTED NOT DETECTED   Klebsiella oxytoca NOT DETECTED NOT DETECTED   Klebsiella pneumoniae NOT DETECTED NOT DETECTED   Proteus species NOT DETECTED NOT DETECTED   Serratia marcescens NOT DETECTED NOT DETECTED   Haemophilus influenzae NOT DETECTED NOT DETECTED   Neisseria meningitidis NOT DETECTED NOT DETECTED   Pseudomonas aeruginosa NOT DETECTED NOT DETECTED   Candida albicans NOT DETECTED NOT DETECTED   Candida glabrata NOT DETECTED NOT DETECTED   Candida krusei NOT DETECTED NOT DETECTED   Candida parapsilosis NOT DETECTED NOT  DETECTED   Candida tropicalis NOT DETECTED NOT DETECTED    Everette Rank, PharmD 10/16/2019  12:31 AM

## 2019-10-16 NOTE — Progress Notes (Signed)
Initial Nutrition Assessment  DOCUMENTATION CODES:   Not applicable  INTERVENTION:  - will order [employer] 1.4 po once/day, each supplement provides 455 kcal and 20 grams protein. - will complete NFPE at follow-up.  NUTRITION DIAGNOSIS:   Increased nutrient needs related to acute illness, chronic illness as evidenced by estimated needs.  GOAL:   Patient will meet greater than or equal to 90% of their needs  MONITOR:   PO intake, Supplement acceptance, Labs, Weight trends  REASON FOR ASSESSMENT:   Malnutrition Screening Tool, Consult Assessment of nutrition requirement/status, Malnutrition Eval  ASSESSMENT:   59 y.o. 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 He presented to the ED with dizziness, difficulty ambulating, chills, sweats, and diarrhea.  Diet was advanced from CLD to Heart Healthy today at [phone] and he ate 50% of breakfast this AM (235 kcal, 12 grams protein).   Weight today is documented as 120 lb and weight has been stable since 08/07/19. Weight on 07/24/19 was 124 lb. Notes indicate some degree of dehydration at admission. Serum Na now only slightly below normal range.   Per notes: - bacteremia - volume depletion with associated dizziness and lightheadedness - pyuria - AKI - hx of alcohol abuse on CIWA protocol   Labs reviewed; Na: 134 mmol/l, Ca: 8.3 mg/dl, Phos: 2.2 mg/dl, Mg: 1.5 mg/dl, AST elevated. Medications reviewed; 1 mg folvite/day, 4 g IV Mg sulfate x1 run 7/29, 1 tablet multivitamin with minerals/day, 100 mg oral thiamine/day.  IVF; NS @ 50 ml/hr.     NUTRITION - FOCUSED PHYSICAL EXAM:  unable to complete at this time.   Diet Order:   Diet Order            Diet Heart Room service appropriate? Yes; Fluid consistency: Thin  Diet effective now                 EDUCATION NEEDS:   No education  needs have been identified at this time  Skin:  Skin Assessment: Reviewed RN Assessment  Last BM:  7/29  Height:   Ht Readings from Last 1 Encounters:  10/16/19 5\' 6"  (1.676 m)    Weight:   Wt Readings from Last 1 Encounters:  10/16/19 54.4 kg    Estimated Nutritional Needs:  Kcal:  1800-2000 kcal Protein:  75-90 grams Fluid:  >/= 2 L/day     Trevor Matin, MS, RD, LDN, CNSC Inpatient Clinical Dietitian RD pager # available in [facility]  After hours/weekend pager # available in [facility]

## 2019-10-16 NOTE — Consult Note (Signed)
Trevor Williams for Infectious Disease    Date of Admission:  10/14/2019          Reason for Consult: Blood cultures growing methicillin-resistant coagulase-negative staph Referring Provider: Dr. Fayrene Helper  Assessment: The cause of his presenting illness remains uncertain.  It is unclear if the positive blood cultures represent true bacteremia or contaminants in both sets.  We have requested the microbiology lab to do full identification and susceptibility testing on both sets to help determine if they are the same (likely true bacteremia) or contaminants.  Although he has not had any documented fever his history of chills and sweats is suggestive.  I recommend antibiotic treatment pending final culture results.  He has a history of possible allergic rash from vancomycin.  I will treat him with daptomycin.  Plan: 1. Daptomycin pending final blood culture results  Principal Problem:   Positive blood culture Active Problems:   Alcohol use   Homeless   Hypokalemia   Tobacco use disorder   Chronic systolic heart failure (HCC)   Elevated LFTs   Hypomagnesemia   Dehydration   Dizziness   COPD (chronic obstructive pulmonary disease) (HCC)   Macrocytic anemia   Scheduled Meds: . docusate sodium  100 mg Oral BID  . feeding supplement  1 Container Oral TID BM  . feeding supplement ([employer] STANDARD 1.4)  325 mL Oral Daily  . folic acid  1 mg Oral Daily  . multivitamin with minerals  1 tablet Oral Daily  . nicotine  21 mg Transdermal Daily  . sodium chloride flush  3 mL Intravenous Q12H  . thiamine  100 mg Oral Daily   Or  . thiamine  100 mg Intravenous Daily   Continuous Infusions: . sodium chloride 50 mL/hr at 10/15/19 2145  . DAPTOmycin (CUBICIN)  IV     PRN Meds:.acetaminophen, LORazepam **OR** LORazepam, ondansetron **OR** ondansetron (ZOFRAN) IV  HPI: Trevor Williams is a 59 y.o. male with a history of homelessness, heavy alcohol use, COPD and chronic  systolic heart failure.  He has not felt well for the past week.  He developed nausea, vomiting, diarrhea, cold chills and sweats leading to admission 2 days ago.  He was not febrile but blood cultures were obtained and both sets are growing coagulase-negative staph.  He is feeling a little bit better.  His diarrhea has resolved.   Review of Systems: Review of Systems  Constitutional: Positive for chills, diaphoresis and malaise/fatigue. Negative for fever.  Respiratory: Positive for cough and shortness of breath. Negative for sputum production and wheezing.   Cardiovascular: Negative for chest pain.  Gastrointestinal: Positive for diarrhea, nausea and vomiting. Negative for abdominal pain.  Genitourinary: Negative for dysuria.  Musculoskeletal: Positive for back pain and joint pain.  Skin: Negative for rash.  Neurological: Negative for headaches.    Past Medical History:  Diagnosis Date  . Abnormal liver function tests   . Acute respiratory failure ([place])    a. 07/2018 requiring intubation - CAP/CHF.  Marland Kitchen Alcohol abuse   . Biventricular heart failure ([place])   . Cancer ([place])    skin (left hand)  . Delirium    a. h/o delirium while admitted  . Emphysema of lung ([place])   . Homelessness   . Macrocytic anemia   . Mild CAD    a. cath 07/2018 60% ostial ramus otherwise OK.  . Mitral regurgitation   . NICM (nonischemic cardiomyopathy) ([place])   . Protein  calorie malnutrition ([place])   . Tricuspid regurgitation     Social History   Tobacco Use  . Smoking status: Current Every Day Smoker    Packs/day: 1.00    Types: Cigarettes  . Smokeless tobacco: Current User  Vaping Use  . Vaping Use: Never used  Substance Use Topics  . Alcohol use: Yes    Alcohol/week: 12.0 standard drinks    Types: 12 Cans of beer per week    Comment: Liquor daily - 1/2 of a fifth   . Drug use: No    Family History  Problem Relation Age of Onset  . Hypertension Maternal Grandfather    Allergies  Allergen  Reactions  . Spironolactone     gynecomastia  . Doxycycline Rash    Rash noted after administration of vancomycin, doxycycline, and ceftriaxone. Unclear cause of rash.  . Ibuprofen Rash  . Rocephin [Ceftriaxone] Rash    Rash noted after administration of vancomycin, doxycycline, and ceftriaxone. Unclear cause of rash.  . Tylenol [Acetaminophen] Rash  . Vancomycin Rash    Rash noted after administration of vancomycin, doxycycline, and ceftriaxone. Unclear cause of rash.    OBJECTIVE: Blood pressure (!) 129/112, pulse 88, temperature 97.6 F (36.4 C), temperature source Oral, resp. rate 20, height 5\' 6"  (1.676 m), weight 54.4 kg, SpO2 100 %.  Physical Exam Constitutional:      Comments: He is thin and frail.  Cardiovascular:     Rate and Rhythm: Normal rate and regular rhythm.     Heart sounds: No murmur heard.   Pulmonary:     Effort: Pulmonary effort is normal.     Breath sounds: Normal breath sounds.  Abdominal:     Palpations: Abdomen is soft.     Tenderness: There is no abdominal tenderness.  Musculoskeletal:        General: No swelling or tenderness.  Skin:    Findings: No rash.     Comments: He has 1 excoriated area on the dorsum of his right hand where he says he had a mosquito bite.  Neurological:     General: No focal deficit present.     Lab Results Lab Results  Component Value Date   WBC 4.2 10/16/2019   HGB 9.9 (L) 10/16/2019   HCT 30.2 (L) 10/16/2019   MCV 115.7 (H) 10/16/2019   PLT 47 (L) 10/16/2019    Lab Results  Component Value Date   CREATININE 0.69 10/16/2019   BUN 16 10/16/2019   NA 134 (L) 10/16/2019   K 3.6 10/16/2019   CL 107 10/16/2019   CO2 20 (L) 10/16/2019    Lab Results  Component Value Date   ALT 33 10/16/2019   AST 80 (H) 10/16/2019   ALKPHOS 94 10/16/2019   BILITOT 1.7 (H) 10/16/2019     Microbiology: Recent Results (from the past 240 hour(s))  SARS Coronavirus 2 by RT PCR (hospital order, performed in [place]  hospital lab) Nasopharyngeal Nasopharyngeal Swab     Status: None   Collection Time: 10/15/19 12:22 AM   Specimen: Nasopharyngeal Swab  Result Value Ref Range Status   SARS Coronavirus 2 NEGATIVE NEGATIVE Final    Comment: (NOTE) SARS-CoV-2 target nucleic acids are NOT DETECTED.  The SARS-CoV-2 RNA is generally detectable in upper and lower respiratory specimens during the acute phase of infection. The lowest concentration of SARS-CoV-2 viral copies this assay can detect is 250 copies / mL. A negative result does not preclude SARS-CoV-2 infection and should not be  used as the sole basis for treatment or other patient management decisions.  A negative result may occur with improper specimen collection / handling, submission of specimen other than nasopharyngeal swab, presence of viral mutation(s) within the areas targeted by this assay, and inadequate number of viral copies (<250 copies / mL). A negative result must be combined with clinical observations, patient history, and epidemiological information.  Fact Sheet for Patients:   StrictlyIdeas.no  Fact Sheet for Healthcare Providers: BankingDealers.co.za  This test is not yet approved or  cleared by the [place] FDA and has been authorized for detection and/or diagnosis of SARS-CoV-2 by FDA under an Emergency Use Authorization (EUA).  This EUA will remain in effect (meaning this test can be used) for the duration of the COVID-19 declaration under Section 564(b)(1) of the Act, 21 U.S.C. section 360bbb-3(b)(1), unless the authorization is terminated or revoked sooner.  Performed at [facility], [place] [address], [place], [place] [address]   Blood culture (routine x 2)     Status: Abnormal (Preliminary result)   Collection Time: 10/15/19 12:45 AM   Specimen: BLOOD  Result Value Ref Range Status   Specimen Description   Final    BLOOD BLOOD RIGHT  ARM Performed at [place] [address], [place], [place] [address]    Special Requests   Final    BOTTLES DRAWN AEROBIC AND ANAEROBIC Blood Culture results may not be optimal due to an inadequate volume of blood received in culture bottles Performed at [facility] [address] [place], [place] [address]    Culture  Setup Time   Final    GRAM POSITIVE COCCI IN CLUSTERS IN BOTH AEROBIC AND ANAEROBIC BOTTLES CRITICAL VALUE NOTED.  VALUE IS CONSISTENT WITH PREVIOUSLY REPORTED AND CALLED VALUE.    Culture (A)  Final    STAPHYLOCOCCUS HOMINIS SUSCEPTIBILITIES TO FOLLOW Performed at [facility], [place] [address], [place], [place] [address]    Report Status PENDING  Incomplete  Blood culture (routine x 2)     Status: Abnormal (Preliminary result)   Collection Time: 10/15/19 12:45 AM   Specimen: BLOOD  Result Value Ref Range Status   Specimen Description   Final    BLOOD BLOOD LEFT FOREARM Performed at [facility] [address], [place], [place] [address]    Special Requests   Final    BOTTLES DRAWN AEROBIC AND ANAEROBIC Blood Culture adequate volume Performed at [facility] [address], [place], [place] [address]    Culture  Setup Time   Final    GRAM POSITIVE COCCI IN CLUSTERS AEROBIC BOTTLE ONLY Organism ID to follow CRITICAL RESULT CALLED TO, READ BACK BY AND VERIFIED WITH: L POINDEXTER PHARMD 2328 10/15/19 A BROWNING    Culture (A)  Final    STAPHYLOCOCCUS HOMINIS SUSCEPTIBILITIES TO FOLLOW Performed at [facility], [place] [address], [place], [place] [address]    Report Status PENDING  Incomplete  Blood Culture ID Panel (Reflexed)     Status: Abnormal   Collection Time: 10/15/19 12:45 AM  Result Value Ref Range Status   Enterococcus species NOT DETECTED NOT DETECTED Final   Listeria monocytogenes NOT DETECTED NOT DETECTED Final   Staphylococcus species DETECTED (A) NOT  DETECTED Final    Comment: Methicillin (oxacillin) resistant coagulase negative staphylococcus. Possible blood culture contaminant (unless isolated from more than one blood culture draw or clinical case suggests pathogenicity). No antibiotic treatment is  indicated for blood  culture contaminants. CRITICAL RESULT CALLED TO, READ BACK BY AND VERIFIED WITH: L POINDEXTER PHARMD 2328 10/15/19 A BROWNING    Staphylococcus aureus (BCID) NOT DETECTED NOT DETECTED Final   Methicillin resistance DETECTED (A) NOT DETECTED Final    Comment: CRITICAL RESULT CALLED TO, READ BACK BY AND VERIFIED WITH: L POINDEXTER PHARMD 2328 10/15/19 A BROWNING    Streptococcus species NOT DETECTED NOT DETECTED Final   Streptococcus agalactiae NOT DETECTED NOT DETECTED Final   Streptococcus pneumoniae NOT DETECTED NOT DETECTED Final   Streptococcus pyogenes NOT DETECTED NOT DETECTED Final   Acinetobacter baumannii NOT DETECTED NOT DETECTED Final   Enterobacteriaceae species NOT DETECTED NOT DETECTED Final   Enterobacter cloacae complex NOT DETECTED NOT DETECTED Final   Escherichia coli NOT DETECTED NOT DETECTED Final   Klebsiella oxytoca NOT DETECTED NOT DETECTED Final   Klebsiella pneumoniae NOT DETECTED NOT DETECTED Final   Proteus species NOT DETECTED NOT DETECTED Final   Serratia marcescens NOT DETECTED NOT DETECTED Final   Haemophilus influenzae NOT DETECTED NOT DETECTED Final   Neisseria meningitidis NOT DETECTED NOT DETECTED Final   Pseudomonas aeruginosa NOT DETECTED NOT DETECTED Final   Candida albicans NOT DETECTED NOT DETECTED Final   Candida glabrata NOT DETECTED NOT DETECTED Final   Candida krusei NOT DETECTED NOT DETECTED Final   Candida parapsilosis NOT DETECTED NOT DETECTED Final   Candida tropicalis NOT DETECTED NOT DETECTED Final    Comment: Performed at [facility], [place]. [address], [place], [place] [address]    Michel Bickers, [place] for Infectious [place]  Group [phone] pager   [phone] cell 10/16/2019, 2:33 PM
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
## 2019-10-16 NOTE — Progress Notes (Addendum)
PT Cancellation Note  Patient Details Name: Trevor Williams MRN: [id number] DOB: [birth year]   Cancelled Treatment:    Reason Eval/Treat Not Completed: Patient declined, no reason specified Pt declined to mobilize this morning stating he was shaky and still dizzy with getting up to bathroom earlier.  14:39 Check back with pt and he would like to rest today and declined to mobilize.  Pt agreeable to participate tomorrow.  Tannia Contino,KATHrine E 10/16/2019, 10:49 AM Jannette Spanner PT, DPT Acute Rehabilitation Services Pager: [phone] Office: [phone]

## 2019-10-17 LAB — HEMOGLOBIN AND HEMATOCRIT, BLOOD
HCT: 25.2 % — ABNORMAL LOW (ref 39.0–52.0)
Hemoglobin: 8.3 g/dL — ABNORMAL LOW (ref 13.0–17.0)

## 2019-10-17 LAB — IRON AND TIBC
Iron: 44 ug/dL — ABNORMAL LOW (ref 45–182)
Saturation Ratios: 24 % (ref 17.9–39.5)
TIBC: 180 ug/dL — ABNORMAL LOW (ref 250–450)
UIBC: 136 ug/dL

## 2019-10-17 LAB — COMPREHENSIVE METABOLIC PANEL
ALT: 39 U/L (ref 0–44)
AST: 98 U/L — ABNORMAL HIGH (ref 15–41)
Albumin: 2.9 g/dL — ABNORMAL LOW (ref 3.5–5.0)
Alkaline Phosphatase: 92 U/L (ref 38–126)
Anion gap: 8 (ref 5–15)
BUN: 10 mg/dL (ref 6–20)
CO2: 25 mmol/L (ref 22–32)
Calcium: 8.3 mg/dL — ABNORMAL LOW (ref 8.9–10.3)
Chloride: 103 mmol/L (ref 98–111)
Creatinine, Ser: 0.45 mg/dL — ABNORMAL LOW (ref 0.61–1.24)
GFR calc Af Amer: >60 mL/min (ref >60)
GFR calc non Af Amer: >60 mL/min (ref >60)
Glucose, Bld: 101 mg/dL — ABNORMAL HIGH (ref 70–99)
Potassium: 3.2 mmol/L — ABNORMAL LOW (ref 3.5–5.1)
Sodium: 136 mmol/L (ref 135–145)
Total Bilirubin: 1.1 mg/dL (ref 0.3–1.2)
Total Protein: 5.9 g/dL — ABNORMAL LOW (ref 6.5–8.1)

## 2019-10-17 LAB — MAGNESIUM: Magnesium: 1.7 mg/dL (ref 1.7–2.4)

## 2019-10-17 LAB — CULTURE, BLOOD (ROUTINE X 2): Special Requests: ADEQUATE

## 2019-10-17 LAB — CBC WITH DIFFERENTIAL/PLATELET
Abs Immature Granulocytes: 0.02 10*3/uL (ref 0.00–0.07)
Basophils Absolute: 0 10*3/uL (ref 0.0–0.1)
Basophils Relative: 1 %
Eosinophils Absolute: 0.1 10*3/uL (ref 0.0–0.5)
Eosinophils Relative: 2 %
HCT: 26.3 % — ABNORMAL LOW (ref 39.0–52.0)
Hemoglobin: 8.5 g/dL — ABNORMAL LOW (ref 13.0–17.0)
Immature Granulocytes: 0 %
Lymphocytes Relative: 23 %
Lymphs Abs: 1.1 10*3/uL (ref 0.7–4.0)
MCH: 37.4 pg — ABNORMAL HIGH (ref 26.0–34.0)
MCHC: 32.3 g/dL (ref 30.0–36.0)
MCV: 115.9 fL — ABNORMAL HIGH (ref 80.0–100.0)
Monocytes Absolute: 0.6 10*3/uL (ref 0.1–1.0)
Monocytes Relative: 12 %
Neutro Abs: 3 10*3/uL (ref 1.7–7.7)
Neutrophils Relative %: 62 %
Platelets: 65 10*3/uL — ABNORMAL LOW (ref 150–400)
RBC: 2.27 MIL/uL — ABNORMAL LOW (ref 4.22–5.81)
RDW: 12.4 % (ref 11.5–15.5)
WBC: 4.9 10*3/uL (ref 4.0–10.5)
nRBC: 0 % (ref 0.0–0.2)

## 2019-10-17 LAB — FOLATE: Folate: 18.3 ng/mL (ref >5.9)

## 2019-10-17 LAB — PHOSPHORUS: Phosphorus: 2.3 mg/dL — ABNORMAL LOW (ref 2.5–4.6)

## 2019-10-17 LAB — FERRITIN: Ferritin: 783 ng/mL — ABNORMAL HIGH (ref 24–336)

## 2019-10-17 LAB — VITAMIN B12: Vitamin B-12: 399 pg/mL (ref 180–914)

## 2019-10-17 MED ORDER — LOPERAMIDE HCL 2 MG PO CAPS
2.0000 mg | ORAL_CAPSULE | ORAL | Status: DC | PRN
  Administered 2019-10-17 – 2019-10-18 (×2): 2 mg via ORAL
  Filled 2019-10-17 (×2): qty 1

## 2019-10-17 MED ORDER — THIAMINE HCL 100 MG/ML IJ SOLN
500.0000 mg | Freq: Three times a day (TID) | INTRAVENOUS | Status: AC
  Administered 2019-10-17 – 2019-10-20 (×9): 500 mg via INTRAVENOUS
  Filled 2019-10-17 (×10): qty 5

## 2019-10-17 MED ORDER — POTASSIUM CHLORIDE CRYS ER 20 MEQ PO TBCR
40.0000 meq | EXTENDED_RELEASE_TABLET | Freq: Once | ORAL | Status: AC
  Administered 2019-10-17: 40 meq via ORAL
  Filled 2019-10-17: qty 2

## 2019-10-17 NOTE — Progress Notes (Signed)
Patient ID: Trevor Williams, male   DOB: 07/19/1960, 59 y.o.   MRN: [id number]         [facility]  Date of Admission:  10/14/2019           Day 2 daptomycin ASSESSMENT: Both sets of admission blood cultures have grown staph hominis with antibiotic susceptibilities pending.  At this point it is still impossible to know if that represents true bacteremia or contaminants of both sets.  If the susceptibility patterns are the same I would treated as true bacteremia with 10 days of therapy.  That could be completed with oral linezolid.  If they are different I would stop daptomycin.  PLAN: 1. Continue daptomycin pending antibiotic susceptibility results 2. Please call Dr. Carlyle Basques [phone]) for any infectious disease questions this weekend  Principal Problem:   Positive blood culture Active Problems:   Alcohol use   Homeless   Hypokalemia   Tobacco use disorder   Chronic systolic heart failure (HCC)   Elevated LFTs   Hypomagnesemia   Dehydration   Dizziness   COPD (chronic obstructive pulmonary disease) (HCC)   Macrocytic anemia   Scheduled Meds: . docusate sodium  100 mg Oral BID  . feeding supplement  1 Container Oral TID BM  . feeding supplement ([employer] STANDARD 1.4)  325 mL Oral Daily  . folic acid  1 mg Oral Daily  . multivitamin with minerals  1 tablet Oral Daily  . nicotine  21 mg Transdermal Daily  . sodium chloride flush  3 mL Intravenous Q12H   Continuous Infusions: . sodium chloride 50 mL/hr at 10/15/19 2145  . DAPTOmycin (CUBICIN)  IV 500 mg (10/16/19 1507)  . thiamine injection     PRN Meds:.acetaminophen, alum & mag hydroxide-simeth, loperamide, LORazepam **OR** LORazepam, ondansetron **OR** ondansetron (ZOFRAN) IV   SUBJECTIVE: He is still feeling dizzy but has not had any further cold chills or sweats.  His diarrhea has resolved.  Review of Systems: Review of Systems  Constitutional: Negative for chills,  diaphoresis and fever.  HENT: Negative for congestion and sore throat.   Respiratory: Positive for cough. Negative for sputum production and shortness of breath.   Cardiovascular: Negative for chest pain.  Gastrointestinal: Negative for abdominal pain, diarrhea, nausea and vomiting.  Genitourinary: Negative for dysuria.  Skin: Negative for rash.    Allergies  Allergen Reactions  . Spironolactone     gynecomastia  . Doxycycline Rash    Rash noted after administration of vancomycin, doxycycline, and ceftriaxone. Unclear cause of rash.  . Ibuprofen Rash  . Rocephin [Ceftriaxone] Rash    Rash noted after administration of vancomycin, doxycycline, and ceftriaxone. Unclear cause of rash.  . Tylenol [Acetaminophen] Rash  . Vancomycin Rash    Rash noted after administration of vancomycin, doxycycline, and ceftriaxone. Unclear cause of rash.    OBJECTIVE: Vitals:   10/16/19 2054 10/17/19 0628 10/17/19 0900 10/17/19 1353  BP: 113/66 112/80  106/65  Pulse: 101 103 103 82  Resp: 18 16  15   Temp: 99.3 F (37.4 C) 99.2 F (37.3 C)  98.2 F (36.8 C)  TempSrc: Oral Oral  Oral  SpO2: 100% 100%  100%  Weight:      Height:       Body mass index is 19.37 kg/m.  Physical Exam Constitutional:      General: He is not in acute distress.    Comments: He is very pleasant and in no distress.  He is  sitting up in bed watching television.  Cardiovascular:     Rate and Rhythm: Normal rate and regular rhythm.     Heart sounds: No murmur heard.   Pulmonary:     Effort: Pulmonary effort is normal.     Breath sounds: Normal breath sounds.  Abdominal:     Palpations: Abdomen is soft.     Tenderness: There is no abdominal tenderness.  Psychiatric:        Mood and Affect: Mood normal.     Lab Results Lab Results  Component Value Date   WBC 4.9 10/17/2019   HGB 8.5 (L) 10/17/2019   HCT 26.3 (L) 10/17/2019   MCV 115.9 (H) 10/17/2019   PLT 65 (L) 10/17/2019    Lab Results  Component  Value Date   CREATININE 0.45 (L) 10/17/2019   BUN 10 10/17/2019   NA 136 10/17/2019   K 3.2 (L) 10/17/2019   CL 103 10/17/2019   CO2 25 10/17/2019    Lab Results  Component Value Date   ALT 39 10/17/2019   AST 98 (H) 10/17/2019   ALKPHOS 92 10/17/2019   BILITOT 1.1 10/17/2019     Microbiology: Recent Results (from the past 240 hour(s))  SARS Coronavirus 2 by RT PCR (hospital order, performed in [place] hospital lab) Nasopharyngeal Nasopharyngeal Swab     Status: None   Collection Time: 10/15/19 12:22 AM   Specimen: Nasopharyngeal Swab  Result Value Ref Range Status   SARS Coronavirus 2 NEGATIVE NEGATIVE Final    Comment: (NOTE) SARS-CoV-2 target nucleic acids are NOT DETECTED.  The SARS-CoV-2 RNA is generally detectable in upper and lower respiratory specimens during the acute phase of infection. The lowest concentration of SARS-CoV-2 viral copies this assay can detect is 250 copies / mL. A negative result does not preclude SARS-CoV-2 infection and should not be used as the sole basis for treatment or other patient management decisions.  A negative result may occur with improper specimen collection / handling, submission of specimen other than nasopharyngeal swab, presence of viral mutation(s) within the areas targeted by this assay, and inadequate number of viral copies (<250 copies / mL). A negative result must be combined with clinical observations, patient history, and epidemiological information.  Fact Sheet for Patients:   StrictlyIdeas.no  Fact Sheet for Healthcare Providers: BankingDealers.co.za  This test is not yet approved or  cleared by the [place] FDA and has been authorized for detection and/or diagnosis of SARS-CoV-2 by FDA under an Emergency Use Authorization (EUA).  This EUA will remain in effect (meaning this test can be used) for the duration of the COVID-19 declaration under Section 564(b)(1)  of the Act, 21 U.S.C. section 360bbb-3(b)(1), unless the authorization is terminated or revoked sooner.  Performed at [facility], [place] [address], [place], [place] [address]   Blood culture (routine x 2)     Status: Abnormal (Preliminary result)   Collection Time: 10/15/19 12:45 AM   Specimen: BLOOD  Result Value Ref Range Status   Specimen Description   Final    BLOOD BLOOD RIGHT ARM Performed at [place] [address], [place], [place] [address]    Special Requests   Final    BOTTLES DRAWN AEROBIC AND ANAEROBIC Blood Culture results may not be optimal due to an inadequate volume of blood received in culture bottles Performed at [place] [address] [place], [place] [address]    Culture  Setup Time  Final    GRAM POSITIVE COCCI IN CLUSTERS IN BOTH AEROBIC AND ANAEROBIC BOTTLES CRITICAL VALUE NOTED.  VALUE IS CONSISTENT WITH PREVIOUSLY REPORTED AND CALLED VALUE.    Culture (A)  Final    STAPHYLOCOCCUS HOMINIS SUSCEPTIBILITIES TO FOLLOW Performed at [facility], [place] [address], [place], [place] [address]    Report Status PENDING  Incomplete  Blood culture (routine x 2)     Status: Abnormal   Collection Time: 10/15/19 12:45 AM   Specimen: BLOOD  Result Value Ref Range Status   Specimen Description   Final    BLOOD BLOOD LEFT FOREARM Performed at [place] [address], [place], [place] [address]    Special Requests   Final    BOTTLES DRAWN AEROBIC AND ANAEROBIC Blood Culture adequate volume Performed at [place] [address] [place], [place] [address]    Culture  Setup Time   Final    GRAM POSITIVE COCCI IN CLUSTERS AEROBIC BOTTLE ONLY Organism ID to follow CRITICAL RESULT CALLED TO, READ BACK BY AND VERIFIED WITH: Elenore Paddy PHARMD 2328 10/15/19 A BROWNING Performed at [facility], [place] [address] [place], [place] [address]     Culture STAPHYLOCOCCUS HOMINIS (A)  Final   Report Status 10/17/2019 FINAL  Final   Organism ID, Bacteria STAPHYLOCOCCUS HOMINIS  Final      Susceptibility   Staphylococcus hominis - MIC*    CIPROFLOXACIN <=0.5 SENSITIVE Sensitive     ERYTHROMYCIN INTERMEDIATE Intermediate     GENTAMICIN <=0.5 SENSITIVE Sensitive     OXACILLIN <=0.25 SENSITIVE Sensitive     TETRACYCLINE <=1 SENSITIVE Sensitive     VANCOMYCIN <=0.5 SENSITIVE Sensitive     TRIMETH/SULFA <=10 SENSITIVE Sensitive     CLINDAMYCIN >=8 RESISTANT Resistant     RIFAMPIN <=0.5 SENSITIVE Sensitive     Inducible Clindamycin NEGATIVE Sensitive     * STAPHYLOCOCCUS HOMINIS  Blood Culture ID Panel (Reflexed)     Status: Abnormal   Collection Time: 10/15/19 12:45 AM  Result Value Ref Range Status   Enterococcus species NOT DETECTED NOT DETECTED Final   Listeria monocytogenes NOT DETECTED NOT DETECTED Final   Staphylococcus species DETECTED (A) NOT DETECTED Final    Comment: Methicillin (oxacillin) resistant coagulase negative staphylococcus. Possible blood culture contaminant (unless isolated from more than one blood culture draw or clinical case suggests pathogenicity). No antibiotic treatment is indicated for blood  culture contaminants. CRITICAL RESULT CALLED TO, READ BACK BY AND VERIFIED WITH: L POINDEXTER PHARMD 2328 10/15/19 A BROWNING    Staphylococcus aureus (BCID) NOT DETECTED NOT DETECTED Final   Methicillin resistance DETECTED (A) NOT DETECTED Final    Comment: CRITICAL RESULT CALLED TO, READ BACK BY AND VERIFIED WITH: L POINDEXTER PHARMD 2328 10/15/19 A BROWNING    Streptococcus species NOT DETECTED NOT DETECTED Final   Streptococcus agalactiae NOT DETECTED NOT DETECTED Final   Streptococcus pneumoniae NOT DETECTED NOT DETECTED Final   Streptococcus pyogenes NOT DETECTED NOT DETECTED Final   Acinetobacter baumannii NOT DETECTED NOT DETECTED Final   Enterobacteriaceae species NOT DETECTED NOT DETECTED Final    Enterobacter cloacae complex NOT DETECTED NOT DETECTED Final   Escherichia coli NOT DETECTED NOT DETECTED Final   Klebsiella oxytoca NOT DETECTED NOT DETECTED Final   Klebsiella pneumoniae NOT DETECTED NOT DETECTED Final   Proteus species NOT DETECTED NOT DETECTED Final   Serratia marcescens NOT DETECTED NOT DETECTED Final   Haemophilus influenzae NOT DETECTED NOT DETECTED Final  Neisseria meningitidis NOT DETECTED NOT DETECTED Final   Pseudomonas aeruginosa NOT DETECTED NOT DETECTED Final   Candida albicans NOT DETECTED NOT DETECTED Final   Candida glabrata NOT DETECTED NOT DETECTED Final   Candida krusei NOT DETECTED NOT DETECTED Final   Candida parapsilosis NOT DETECTED NOT DETECTED Final   Candida tropicalis NOT DETECTED NOT DETECTED Final    Comment: Performed at [facility], [place] [address], [place], [place] [address]  Culture, Urine     Status: Abnormal   Collection Time: 10/15/19  2:15 AM   Specimen: Urine, Clean Catch  Result Value Ref Range Status   Specimen Description   Final    URINE, CLEAN CATCH Performed at [facility], [place] [address] [place], [place] [address]    Special Requests   Final    NONE Performed at [facility], [place] [address] [place], [place] [address]    Culture MULTIPLE SPECIES PRESENT, SUGGEST RECOLLECTION (A)  Final   Report Status 10/16/2019 FINAL  Final    Michel Bickers, MD [facility] [phone] pager   [phone] cell 10/17/2019, 2:18 PM

## 2019-10-17 NOTE — Progress Notes (Signed)
PROGRESS NOTE    Trevor Williams  OJJ:[id number] DOB: 24-Jan-1961 DOA: 10/14/2019 PCP: Elsie Stain, MD   Chief Complaint  Patient presents with  . Diarrhea   Brief Narrative: Trevor Williams is Trevor Williams 59 y.o. 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  Assessment & Plan:   Principal Problem:   Positive blood culture Active Problems:   Alcohol use   Homeless   Hypokalemia   Tobacco use disorder   Chronic systolic heart failure (HCC)   Elevated LFTs   Hypomagnesemia   Dehydration   Dizziness   COPD (chronic obstructive pulmonary disease) (HCC)   Macrocytic anemia  59 y.o. male with medical history significant of  EtOH abuse, History of cardiomyopathy. 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  Coagulase Negative Staph Bacteremia: in 3/4 bottles, concerning for bacteremia despite coag negative.   Will hold antibiotics given stability and repeat blood cultures  Pending susceptibility Continue dapto per ID Appreciate ID consult  Follow pending cultures  Dizziness  Lightheadedness  Volume Depletion:  Check orthostatics (pending), continue gentle hydration MRI without acute intracranial abnormality Physical therapy  Chronic systolic heart failure (HCC) -monitor for any sign of fluid overload Echo from 04/2019 notable for EF 30% Echo 7/28 with EF 60-65%, impaired diastolic function (see report) I/O, daily weights  Pyuria: in setting of chills, follow urine culture and treat as indicated.  He's afebrile without leukocytosis.   Follow urine culture (multiple species).  Blood cultures as above.  AKI  Proteinuria Baseline creatinine <1 At presentation 1.57, improving with IVF Urine protein/creatinine ratio, 0.15 Follow renal US with trace pararenal fluid on Trevor  Alcohol abuse.  Will order CIWA protocol monitor for any signs of withdrawal.  High dose thiamine.  Elevated LFTs most likely secondary to alcohol abuse [place] notable for cholelithiasis without cholecystitis and steatosis Negative HIV and acute hepatitis panel  Hypokalemia - improved - replcae and follow  Hypomagnesemia - replace and follow  Anemia: downtrending, no obvious signs of bleeding.  Follow repeat H/H.  Iron, b12, folate, ferritin.   Tobacco use disorder -  encourage cessation  Thrombocytopenia most likely secondary to alcohol abuse and ?liver disease  Chronic Frontotemporal Encephalomalacia: post ischemic vs post traumatic   DVT prophylaxis: SCD Code Status: full Family Communication: none Disposition:   Status is: Observation  The patient will require care spanning > 2 midnights and should be moved to inpatient because: IV treatments appropriate due to intensity of illness or inability to take PO  Dispo: The patient is from: Home              Anticipated d/c is to: Home              Anticipated d/c date is: > 3 days              Patient currently is not medically stable to d/c.       Consultants:   none  Procedures:  Echo IMPRESSIONS    1. Left ventricular ejection fraction, by estimation, is 60 to 65%. The  left ventricle has normal function. The left ventricle has no regional  wall motion abnormalities. Left ventricular diastolic parameters  are  consistent with Grade I diastolic  dysfunction (impaired relaxation).  2. Right ventricular systolic function is normal. The right ventricular  size is normal. There is normal pulmonary artery systolic pressure.  3. Left atrial size was moderately dilated.  4.  The mitral valve is normal in structure. No evidence of mitral valve  regurgitation. No evidence of mitral stenosis.  5. The aortic valve is tricuspid. Aortic valve regurgitation is not  visualized. No aortic stenosis is present.  6. Aortic dilatation noted. There is mild dilatation at the level of the  sinuses of Valsalva measuring 41 mm.  7. The inferior vena cava is normal in size with greater than 50%  respiratory variability, suggesting right atrial pressure of 3 mmHg.  Antimicrobials: Anti-infectives (From admission, onward)   Start     Dose/Rate Route Frequency Ordered Stop   10/16/19 1500  DAPTOmycin (CUBICIN) 500 mg in sodium chloride 0.9 % IVPB     Discontinue     500 mg 220 mL/hr over 30 Minutes Intravenous Daily 10/16/19 1332     10/16/19 1300  vancomycin (VANCOREADY) IVPB 750 mg/150 mL  Status:  Discontinued        750 mg 150 mL/hr over 60 Minutes Intravenous Every 12 hours 10/16/19 1227 10/16/19 1328         Subjective: Still feels unsteady on his feet  Objective: Vitals:   10/16/19 2054 10/17/19 0628 10/17/19 0900 10/17/19 1353  BP: 113/66 112/80  106/65  Pulse: 101 103 103 82  Resp: 18 16  15   Temp: 99.3 F (37.4 C) 99.2 F (37.3 C)  98.2 F (36.8 C)  TempSrc: Oral Oral  Oral  SpO2: 100% 100%  100%  Weight:      Height:        Intake/Output Summary (Last 24 hours) at 10/17/2019 1453 Last data filed at 10/17/2019 0943 Gross per 24 hour  Intake 353 ml  Output --  Net 353 ml   Filed Weights   10/16/19 1001  Weight: 54.4 kg    Examination:  General: No acute distress. Cardiovascular: Heart sounds show Trevor Williams regular rate, and rhythm.  Lungs: Clear to auscultation bilaterally Abdomen: Soft, nontender, nondistended Neurological: Alert and oriented 3. Moves all extremities 4 . Cranial nerves II through XII grossly intact. Skin: Warm and dry. No rashes or lesions. Extremities: No clubbing or cyanosis. No    Data Reviewed: I have personally  reviewed following labs and imaging studies  CBC: Recent Labs  Lab 10/14/19 1533 10/15/19 0406 10/16/19 0521 10/17/19 0549  WBC 7.4 5.9 4.2 4.9  NEUTROABS  --  3.9 2.3 3.0  HGB 11.2* 9.6* 9.9* 8.5*  HCT 33.6* 29.6* 30.2* 26.3*  MCV 114.3* 115.6* 115.7* 115.9*  PLT 64* 43* 47* 65*    Basic Metabolic Panel: Recent Labs  Lab 10/14/19 1533 10/14/19 1533 10/15/19 0215 10/15/19 0406 10/15/19 2100 10/16/19 0521 10/17/19 0549  NA 136   < > 137 137 135 134* 136  K 2.7*   < > 4.0 3.7 4.2 3.6 3.2*  CL 96*   < > 101 103 105 107 103  CO2 23   < > 21* 20* 20* 20* 25  GLUCOSE 93   < > 98 86 106* 84 101*  BUN 27*   < > 29* 29* 19 16 10   CREATININE 1.54*   < > 1.57* 1.38* 0.85 0.69 0.45*  CALCIUM 8.8*   < > 7.9* 7.6* 8.6* 8.3* 8.3*  MG 1.2*  --   --  2.0  --  1.5* 1.7  PHOS  --   --  3.0 2.7  --  2.2* 2.3*   < > = values in this interval not displayed.    GFR: Estimated Creatinine Clearance: 77.4 mL/min (Trevor Williams) (by C-G formula based on SCr of 0.45 mg/dL (Trevor)).  Liver Function Tests: Recent Labs  Lab 10/14/19 1533 10/15/19 0406 10/16/19 0521 10/17/19 0549  AST 79* 71* 80* 98*  ALT 38 31 33 39  ALKPHOS 115 94 94 92  BILITOT 2.2* 1.6* 1.7* 1.1  PROT 7.7 5.8* 5.8* 5.9*  ALBUMIN 3.9 2.9* 2.9* 2.9*    CBG: No results for input(s): GLUCAP in the last 168 hours.   Recent Results (from the past 240 hour(s))  SARS Coronavirus 2 by RT PCR (hospital order, performed in [facility] hospital lab) Nasopharyngeal Nasopharyngeal Swab     Status: None   Collection Time: 10/15/19 12:22 AM   Specimen: Nasopharyngeal Swab  Result Value Ref Range Status   SARS Coronavirus 2 NEGATIVE NEGATIVE Final    Comment: (NOTE) SARS-CoV-2 target nucleic acids are NOT DETECTED.  The SARS-CoV-2 RNA is generally detectable in upper and lower respiratory specimens during the acute phase of infection. The lowest concentration of SARS-CoV-2 viral copies this assay can detect is 250 copies / mL. Trevor Williams  negative result does not preclude SARS-CoV-2 infection and should not be used as the sole basis for treatment or other patient management decisions.  Trevor Williams negative result may occur with improper specimen collection / handling, submission of specimen other than nasopharyngeal swab, presence of viral mutation(s) within the areas targeted by this assay, and inadequate number of viral copies (<250 copies / mL). Trevor Williams negative result must be combined with clinical observations, patient history, and epidemiological information.  Fact Sheet for Patients:   StrictlyIdeas.no  Fact Sheet for Healthcare Providers: BankingDealers.co.za  This test is not yet approved or  cleared by the [place] FDA and has been authorized for detection and/or diagnosis of SARS-CoV-2 by FDA under an Emergency Use Authorization (EUA).  This EUA will remain in effect (meaning this test can be used) for the duration of the COVID-19 declaration under Section 564(b)(1) of the Act, 21 U.S.C. section 360bbb-3(b)(1), unless the authorization is terminated or revoked sooner.  Performed at [facility], [place] [address], [place], [place] [address]   Blood culture (routine x 2)     Status: Abnormal (Preliminary result)   Collection Time: 10/15/19 12:45 AM   Specimen: BLOOD  Result Value Ref Range Status   Specimen Description   Final    BLOOD BLOOD RIGHT ARM Performed at [facility] [address] [place], [place] [address]    Special Requests   Final    BOTTLES DRAWN AEROBIC AND ANAEROBIC Blood Culture results may not be optimal due to an inadequate volume of blood received in culture bottles Performed at [facility] [address], [place], [place] [address]    Culture  Setup Time   Final    GRAM POSITIVE COCCI IN CLUSTERS IN BOTH AEROBIC AND ANAEROBIC BOTTLES CRITICAL VALUE NOTED.  VALUE IS CONSISTENT WITH  PREVIOUSLY REPORTED AND CALLED VALUE.    Culture (Trevor Williams)  Final    STAPHYLOCOCCUS HOMINIS SUSCEPTIBILITIES TO FOLLOW Performed at [facility], [place] [address] [place], [place] [address]    Report Status PENDING  Incomplete  Blood culture (routine x 2)     Status: Abnormal   Collection Time: 10/15/19  12:45 AM   Specimen: BLOOD  Result Value Ref Range Status   Specimen Description   Final    BLOOD BLOOD LEFT FOREARM Performed at [facility] [address], [place], [place] [address]    Special Requests   Final    BOTTLES DRAWN AEROBIC AND ANAEROBIC Blood Culture adequate volume Performed at [facility] [address], [place], [place] [address]    Culture  Setup Time   Final    GRAM POSITIVE COCCI IN CLUSTERS AEROBIC BOTTLE ONLY Organism ID to follow CRITICAL RESULT CALLED TO, READ BACK BY AND VERIFIED WITH: Trevor Williams PHARMD 2328 10/15/19 Trevor Williams Performed at [facility], [place] [address], [place], [place] [address]    Culture STAPHYLOCOCCUS HOMINIS (Trevor Williams)  Final   Report Status 10/17/2019 FINAL  Final   Organism ID, Bacteria STAPHYLOCOCCUS HOMINIS  Final      Susceptibility   Staphylococcus hominis - MIC*    CIPROFLOXACIN <=0.5 SENSITIVE Sensitive     ERYTHROMYCIN INTERMEDIATE Intermediate     GENTAMICIN <=0.5 SENSITIVE Sensitive     OXACILLIN <=0.25 SENSITIVE Sensitive     TETRACYCLINE <=1 SENSITIVE Sensitive     VANCOMYCIN <=0.5 SENSITIVE Sensitive     TRIMETH/SULFA <=10 SENSITIVE Sensitive     CLINDAMYCIN >=8 RESISTANT Resistant     RIFAMPIN <=0.5 SENSITIVE Sensitive     Inducible Clindamycin NEGATIVE Sensitive     * STAPHYLOCOCCUS HOMINIS  Blood Culture ID Panel (Reflexed)     Status: Abnormal   Collection Time: 10/15/19 12:45 AM  Result Value Ref Range Status   Enterococcus species NOT DETECTED NOT DETECTED Final   Listeria monocytogenes NOT DETECTED NOT DETECTED Final   Staphylococcus species DETECTED (Trevor Williams) NOT  DETECTED Final    Comment: Methicillin (oxacillin) resistant coagulase negative staphylococcus. Possible blood culture contaminant (unless isolated from more than one blood culture draw or clinical case suggests pathogenicity). No antibiotic treatment is indicated for blood  culture contaminants. CRITICAL RESULT CALLED TO, READ BACK BY AND VERIFIED WITH: Trevor Williams PHARMD 2328 10/15/19 Trevor Williams Williams    Staphylococcus aureus (BCID) NOT DETECTED NOT DETECTED Final   Methicillin resistance DETECTED (Trevor Williams) NOT DETECTED Final    Comment: CRITICAL RESULT CALLED TO, READ BACK BY AND VERIFIED WITH: Trevor Williams PHARMD 2328 10/15/19 Sharonna Vinje Williams    Streptococcus species NOT DETECTED NOT DETECTED Final   Streptococcus agalactiae NOT DETECTED NOT DETECTED Final   Streptococcus pneumoniae NOT DETECTED NOT DETECTED Final   Streptococcus pyogenes NOT DETECTED NOT DETECTED Final   Acinetobacter baumannii NOT DETECTED NOT DETECTED Final   Enterobacteriaceae species NOT DETECTED NOT DETECTED Final   Enterobacter cloacae complex NOT DETECTED NOT DETECTED Final   Escherichia coli NOT DETECTED NOT DETECTED Final   Klebsiella oxytoca NOT DETECTED NOT DETECTED Final   Klebsiella pneumoniae NOT DETECTED NOT DETECTED Final   Proteus species NOT DETECTED NOT DETECTED Final   Serratia marcescens NOT DETECTED NOT DETECTED Final   Haemophilus influenzae NOT DETECTED NOT DETECTED Final   Neisseria meningitidis NOT DETECTED NOT DETECTED Final   Pseudomonas aeruginosa NOT DETECTED NOT DETECTED Final   Candida albicans NOT DETECTED NOT DETECTED Final   Candida glabrata NOT DETECTED NOT DETECTED Final   Candida krusei NOT DETECTED NOT DETECTED Final   Candida parapsilosis NOT DETECTED NOT DETECTED Final   Candida tropicalis NOT DETECTED NOT DETECTED Final    Comment: Performed at [facility], [place]. [address] [place], [place] [address]  Culture,  Urine     Status: Abnormal   Collection Time: 10/15/19  2:15 AM    Specimen: Urine, Clean Catch  Result Value Ref Range Status   Specimen Description   Final    URINE, CLEAN CATCH Performed at [facility], [place] [address], [place], [place] [address]    Special Requests   Final    NONE Performed at [facility], [place] [address], [place], [place] [address]    Culture MULTIPLE SPECIES PRESENT, SUGGEST RECOLLECTION (Demetrick Eichenberger)  Final   Report Status 10/16/2019 FINAL  Final         Radiology Studies: US RENAL  Result Date: 10/15/2019 CLINICAL DATA:  59 year old male with acute kidney injury. EXAM: RENAL / URINARY TRACT ULTRASOUND COMPLETE COMPARISON:  Right upper quadrant ultrasound earlier today. [facility] CT Abdomen and Pelvis 01/03/2015 FINDINGS: Right Kidney: Renal measurements: 12.5 x 4.0 x 4.3 cm = volume: 111 mL . Echogenicity within normal limits. No mass or hydronephrosis visualized. Left Kidney: Renal measurements: 11.6 x 5.0 x 5.5 cm = volume: 168 mL. Negative ultrasound appearance of the left kidney aside from evidence of trace pararenal fluid (image 21). Preserved cortical echogenicity. No hydronephrosis. Bladder: Diminutive, unremarkable. Other: Echogenic liver (image 3) as seen earlier today. Prostate measures up to 50 mm diameter. IMPRESSION: 1. Trace pararenal fluid on the left but otherwise normal ultrasound appearance of both kidneys. 2. Diminutive and unremarkable bladder. Electronically Signed   By: Genevie Ann M.D.   On: 10/15/2019 19:55        Scheduled Meds: . docusate sodium  100 mg Oral BID  . feeding supplement  1 Container Oral TID BM  . feeding supplement (KATE FARMS STANDARD 1.4)  325 mL Oral Daily  . folic acid  1 mg Oral Daily  . multivitamin with minerals  1 tablet Oral Daily  . nicotine  21 mg Transdermal Daily  . sodium chloride flush  3 mL Intravenous Q12H   Continuous Infusions: . sodium chloride 50 mL/hr at 10/15/19 2145  . DAPTOmycin (CUBICIN)  IV 500 mg (10/16/19 1507)  .  thiamine injection 500 mg (10/17/19 1449)     LOS: 2 days    Time spent: over 47 min    Fayrene Helper, MD [facility]   To contact the attending provider between 7A-7P or the covering provider during after hours 7P-7A, please log into the web site [email or web address] and access using universal [place] password for that web site. If you do not have the password, please call the hospital operator.  10/17/2019, 2:53 PM

## 2019-10-17 NOTE — Evaluation (Signed)
Physical Therapy Evaluation Patient Details Name: Trevor Williams MRN: [id number] DOB: [birth year] Today's Date: 10/17/2019   History of Present Illness  Trevor Williams is a 59 y.o. 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    Clinical Impression  Trevor Williams is 59 y.o. male admitted with above HPI and diagnosis. Patient is currently limited by functional impairments below (see PT problem list). Patient is currently homeless and is independent with SPC to mobilize in community at baseline. Patient evaluated by Physical Therapy with no further acute PT needs identified. All education has been completed and the patient has no further questions. Trevor Williams is functioning close to his baseline and is supervision to Mod independent for all mobility. Pt is limited with mobility by elevated HR into 120's-130's and max of 144 bpm with gait. He is safe to mobilize with RN as able at this time and does not require skilled PT. See below for any follow-up Physical Therapy or equipment needs. PT is signing off. Thank you for this referral.     Follow Up Recommendations No PT follow up    Equipment Recommendations       Recommendations for Other Services       Precautions / Restrictions Precautions Precautions: Fall Precaution Comments: probably fallen in last 6 months Restrictions Weight Bearing Restrictions: No      Mobility  Bed Mobility Overal bed mobility: Modified Independent             General bed mobility comments: HOB elevated  Transfers Overall transfer level: Needs assistance Equipment used: Straight cane Transfers: Sit to/from Stand Sit to Stand: Supervision         General transfer comment: pt steady with rising from  EOB, bench, and commode without assist.   Ambulation/Gait Ambulation/Gait assistance: Supervision Gait Distance (Feet): 300 Feet Assistive device: Straight cane Gait Pattern/deviations: Step-through pattern;Wide base of support Gait velocity: fair   General Gait Details: pt with no overt LOB and overall steady gait. SPC in Lt UE. pt with mildly wide BOS during gait. HR in 120's throuhgout most of gait and spike noted to max of 144 bpm and maintained in 130's. RN alerted.   Stairs            Wheelchair Mobility    Modified Rankin (Stroke Patients Only)       Balance Overall balance assessment: Mild deficits observed, not formally tested             Pertinent Vitals/Pain Pain Assessment: Faces Faces Pain Scale: No hurt Pain Intervention(s): Monitored during session    Home Living Family/patient expects to be discharged to:: Shelter/Homeless                 Additional Comments: Pt reports he was living in a tent in the woods. He has a SPC to get around and uses food stamps to grocery shop.     Prior Function Level of Independence: Independent         Comments: pt reports he gets fatigued quickly and has to walk      Hand Dominance   Dominant Hand: Left    Extremity/Trunk Assessment   Upper Extremity Assessment Upper Extremity Assessment: Overall WFL for tasks assessed    Lower Extremity Assessment Lower Extremity Assessment: Overall WFL for tasks assessed  Cervical / Trunk Assessment Cervical / Trunk Assessment: Normal  Communication   Communication: No difficulties  Cognition Arousal/Alertness: Awake/alert Behavior During Therapy: WFL for tasks assessed/performed Overall Cognitive Status: Within Functional Limits for tasks assessed         General Comments      Exercises     Assessment/Plan    PT Assessment Patent does not need any further PT services  PT Problem List         PT Treatment Interventions      PT Goals  (Current goals can be found in the Care Plan section)  Acute Rehab PT Goals Patient Stated Goal: to get better and back home PT Goal Formulation: All assessment and education complete, DC therapy Time For Goal Achievement: 10/31/19 Potential to Achieve Goals: Good    Frequency  1x eval    AM-PAC PT "6 Clicks" Mobility  Outcome Measure Help needed turning from your back to your side while in a flat bed without using bedrails?: None Help needed moving from lying on your back to sitting on the side of a flat bed without using bedrails?: None Help needed moving to and from a bed to a chair (including a wheelchair)?: None Help needed standing up from a chair using your arms (e.g., wheelchair or bedside chair)?: None Help needed to walk in hospital room?: None Help needed climbing 3-5 steps with a railing? : A Little 6 Click Score: 23    End of Session Equipment Utilized During Treatment: Gait belt Activity Tolerance: Patient tolerated treatment well Patient left: in bed;with bed alarm set;with call bell/phone within reach Nurse Communication: Mobility status;Other (comment) (notified of HR) PT Visit Diagnosis: Muscle weakness (generalized) (M62.81);Other abnormalities of gait and mobility (R26.89)    Time: 3875-6433 PT Time Calculation (min) (ACUTE ONLY): 20 min   Charges:   PT Evaluation $PT Eval Moderate Complexity: 1 Mod        Verner Mould, DPT Acute Rehabilitation Services  Office [phone] Pager [phone]  10/17/2019 5:15 PM

## 2019-10-17 NOTE — Progress Notes (Signed)
Writer called by Telemetry monitoring reporting pulse in 140's.  Found patient had 550 cc emesis to include the chocolate ice cream he had requested.  This is the second night of emesis after ice cream.  Requested that patient avoid dairy until evaluated.  Pulse returned to below 100.  Maalox re-administered.  Rash on body has red patchy and circular qualities.  Reports living in the woods and having pulled four ticks off himself.  Admits to prior night sweats.  Hygiene may not have been optimal.   Bathed, powdered, lotion applied tonight.         Requesting evaluation for tick-born illnesses.     Thank you.

## 2019-10-18 LAB — CBC WITH DIFFERENTIAL/PLATELET
Abs Immature Granulocytes: 0.04 10*3/uL (ref 0.00–0.07)
Basophils Absolute: 0 10*3/uL (ref 0.0–0.1)
Basophils Relative: 1 %
Eosinophils Absolute: 0.1 10*3/uL (ref 0.0–0.5)
Eosinophils Relative: 2 %
HCT: 24.3 % — ABNORMAL LOW (ref 39.0–52.0)
Hemoglobin: 8 g/dL — ABNORMAL LOW (ref 13.0–17.0)
Immature Granulocytes: 1 %
Lymphocytes Relative: 22 %
Lymphs Abs: 1.3 10*3/uL (ref 0.7–4.0)
MCH: 37.6 pg — ABNORMAL HIGH (ref 26.0–34.0)
MCHC: 32.9 g/dL (ref 30.0–36.0)
MCV: 114.1 fL — ABNORMAL HIGH (ref 80.0–100.0)
Monocytes Absolute: 0.8 10*3/uL (ref 0.1–1.0)
Monocytes Relative: 14 %
Neutro Abs: 3.6 10*3/uL (ref 1.7–7.7)
Neutrophils Relative %: 60 %
Platelets: 82 10*3/uL — ABNORMAL LOW (ref 150–400)
RBC: 2.13 MIL/uL — ABNORMAL LOW (ref 4.22–5.81)
RDW: 12.4 % (ref 11.5–15.5)
WBC: 5.8 10*3/uL (ref 4.0–10.5)
nRBC: 0 % (ref 0.0–0.2)

## 2019-10-18 LAB — CULTURE, BLOOD (ROUTINE X 2)

## 2019-10-18 LAB — COMPREHENSIVE METABOLIC PANEL
ALT: 40 U/L (ref 0–44)
AST: 90 U/L — ABNORMAL HIGH (ref 15–41)
Albumin: 3 g/dL — ABNORMAL LOW (ref 3.5–5.0)
Alkaline Phosphatase: 87 U/L (ref 38–126)
Anion gap: 8 (ref 5–15)
BUN: 9 mg/dL (ref 6–20)
CO2: 26 mmol/L (ref 22–32)
Calcium: 8.4 mg/dL — ABNORMAL LOW (ref 8.9–10.3)
Chloride: 104 mmol/L (ref 98–111)
Creatinine, Ser: 0.55 mg/dL — ABNORMAL LOW (ref 0.61–1.24)
GFR calc Af Amer: >60 mL/min (ref >60)
GFR calc non Af Amer: >60 mL/min (ref >60)
Glucose, Bld: 93 mg/dL (ref 70–99)
Potassium: 3.6 mmol/L (ref 3.5–5.1)
Sodium: 138 mmol/L (ref 135–145)
Total Bilirubin: 1 mg/dL (ref 0.3–1.2)
Total Protein: 5.8 g/dL — ABNORMAL LOW (ref 6.5–8.1)

## 2019-10-18 LAB — SEDIMENTATION RATE: Sed Rate: 20 mm/hr — ABNORMAL HIGH (ref 0–16)

## 2019-10-18 LAB — C-REACTIVE PROTEIN: CRP: 0.7 mg/dL (ref <1.0)

## 2019-10-18 LAB — PHOSPHORUS: Phosphorus: 2.4 mg/dL — ABNORMAL LOW (ref 2.5–4.6)

## 2019-10-18 LAB — MAGNESIUM: Magnesium: 1.4 mg/dL — ABNORMAL LOW (ref 1.7–2.4)

## 2019-10-18 MED ORDER — MAGNESIUM SULFATE 2 GM/50ML IV SOLN
2.0000 g | Freq: Once | INTRAVENOUS | Status: AC
  Administered 2019-10-18: 2 g via INTRAVENOUS
  Filled 2019-10-18: qty 50

## 2019-10-18 MED ORDER — FERROUS SULFATE 325 (65 FE) MG PO TABS
325.0000 mg | ORAL_TABLET | Freq: Every day | ORAL | Status: DC
  Administered 2019-10-19 – 2019-10-24 (×6): 325 mg via ORAL
  Filled 2019-10-18 (×6): qty 1

## 2019-10-18 MED ORDER — OXYCODONE HCL 5 MG PO TABS
5.0000 mg | ORAL_TABLET | ORAL | Status: DC | PRN
  Administered 2019-10-19 – 2019-10-24 (×18): 5 mg via ORAL
  Filled 2019-10-18 (×18): qty 1

## 2019-10-18 MED ORDER — FLUCONAZOLE 150 MG PO TABS
150.0000 mg | ORAL_TABLET | Freq: Every day | ORAL | Status: DC
  Administered 2019-10-18 – 2019-10-21 (×4): 150 mg via ORAL
  Filled 2019-10-18 (×4): qty 1

## 2019-10-18 MED ORDER — DOXYCYCLINE HYCLATE 100 MG PO TABS
100.0000 mg | ORAL_TABLET | Freq: Two times a day (BID) | ORAL | Status: DC
  Administered 2019-10-18 – 2019-10-20 (×4): 100 mg via ORAL
  Filled 2019-10-18 (×4): qty 1

## 2019-10-18 NOTE — Progress Notes (Addendum)
PROGRESS NOTE    Trevor Williams  KNT:[id number] DOB: May 17, 1960 DOA: 10/14/2019 PCP: Storm Frisk, MD   Chief Complaint  Patient presents with  . Diarrhea   Brief Narrative: Trevor Williams is Trevor Williams 59 y.o. 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  Assessment & Plan:   Principal Problem:   Positive blood culture Active Problems:   Alcohol use   Homeless   Hypokalemia   Tobacco use disorder   Chronic systolic heart failure (HCC)   Elevated LFTs   Hypomagnesemia   Dehydration   Dizziness   COPD (chronic obstructive pulmonary disease) (HCC)   Macrocytic anemia  59 y.o. male with medical history significant of  EtOH abuse, History of cardiomyopathy. 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  Coagulase Negative Staph Bacteremia: in 3/4 bottles, concerning for bacteremia despite coag negative.   Will hold antibiotics given stability and repeat blood cultures  Pending susceptibility - discussed with micro, 1 blood culture from 7/28 not showing sensitivities - 1 blood culture showing resistance to clindamycin and intermediate sensitivity to erythromycin.  The other, per my discussion with micro, is showing resistance to erythromycin and sensitivity to clindamycin.  Requested micro fax this report other (unfortunately, not showing up on epic).  With different susceptibility patterns, will stop daptomycin. Repeat blood cx no growth to date.  Continue dapto per ID Appreciate ID consult  Follow pending cultures  Concern for Tick Borne  Illness: per RN discussion with pt and pt discussion, pt pulled off 4 ticks this summer.  Most recent Trevor Williams few weeks ago?  He has Trevor Williams rash, which doesn't appear c/w erythema migrans, appears more consistent with fungal skin infection  He has low platelets (chronic) and elevated LFT's (some chronicity as well), but his general malaise, headaches, weakness continue at this point.   Could consider treating empirically, but has hx rash with doxy, will discuss with pharmacy as appears which medication was causing rash Normal CRP, follow ESR Start doxy, watch for rash - he can't tell me about rash, discussed with pt  Rash: confluent erythematous plaque on back and abdomen with some overlying scale, more defined/raised border.  Suspect this is most likely fungal, doesn't appear to be erythema migrans. Start fluconazole  Diarrhea: follow fecal lactoferrin, consider additional w/u if continued diarrhea  Dizziness  Lightheadedness  Volume Depletion:  Check orthostatics (negative), continue gentle hydration MRI without acute intracranial abnormality Physical therapy  Chronic systolic heart failure (HCC) -monitor for any sign of fluid overload Echo from 04/2019 notable for EF 30% Echo 7/28 with EF 60-65%, impaired diastolic function (see report) I/O, daily weights  Pyuria: in setting of chills, follow urine culture and treat as indicated.  He's afebrile without leukocytosis.  Follow urine culture (multiple species).  Blood cultures as above.  AKI  Proteinuria Baseline creatinine <1 At presentation 1.57, improving with IVF Urine protein/creatinine ratio, 0.15 Follow renal US with trace pararenal fluid on L  Alcohol abuse.  Will order CIWA protocol monitor for any signs of withdrawal.  High dose thiamine.  Elevated LFTs most likely secondary to alcohol abuse [place] notable for cholelithiasis without cholecystitis and steatosis Negative HIV and acute hepatitis  panel  Hypokalemia - improved - replcae  and follow  Hypomagnesemia - replace and follow  Iron Deficiency Anemia: downtrending, no obvious signs of bleeding.  Iron supplementation  Tobacco use disorder -  encourage cessation  Thrombocytopenia most likely secondary to alcohol abuse and ?liver disease  Chronic Frontotemporal Encephalomalacia: post ischemic vs post traumatic   DVT prophylaxis: SCD Code Status: full Family Communication: none Disposition:   Status is: Observation  The patient will require care spanning > 2 midnights and should be moved to inpatient because: IV treatments appropriate due to intensity of illness or inability to take PO  Dispo: The patient is from: Home              Anticipated d/c is to: Home              Anticipated d/c date is: > 3 days              Patient currently is not medically stable to d/c.       Consultants:   none  Procedures:  Echo IMPRESSIONS    1. Left ventricular ejection fraction, by estimation, is 60 to 65%. The  left ventricle has normal function. The left ventricle has no regional  wall motion abnormalities. Left ventricular diastolic parameters are  consistent with Grade I diastolic  dysfunction (impaired relaxation).  2. Right ventricular systolic function is normal. The right ventricular  size is normal. There is normal pulmonary artery systolic pressure.  3. Left atrial size was moderately dilated.  4. The mitral valve is normal in structure. No evidence of mitral valve  regurgitation. No evidence of mitral stenosis.  5. The aortic valve is tricuspid. Aortic valve regurgitation is not  visualized. No aortic stenosis is present.  6. Aortic dilatation noted. There is mild dilatation at the level of the  sinuses of Valsalva measuring 41 mm.  7. The inferior vena cava is normal in size with greater than 50%  respiratory variability, suggesting right atrial pressure of 3 mmHg.  Antimicrobials: Anti-infectives (From admission, onward)   Start      Dose/Rate Route Frequency Ordered Stop   10/18/19 1600  fluconazole (DIFLUCAN) tablet 150 mg     Discontinue     150 mg Oral Daily 10/18/19 1443 11/01/19 0959   10/16/19 1500  DAPTOmycin (CUBICIN) 500 mg in sodium chloride 0.9 % IVPB     Discontinue     500 mg 220 mL/hr over 30 Minutes Intravenous Daily 10/16/19 1332     10/16/19 1300  vancomycin (VANCOREADY) IVPB 750 mg/150 mL  Status:  Discontinued        750 mg 150 mL/hr over 60 Minutes Intravenous Every 12 hours 10/16/19 1227 10/16/19 1328         Subjective: Feels generally poorly   Objective: Vitals:   10/18/19 0532 10/18/19 0534 10/18/19 0535 10/18/19 1502  BP: 115/73 (!) 80/65 103/68 122/76  Pulse: 70 (!) 40 70 82  Resp: _0 Temp:    98.9 F (37.2 C)  TempSrc:    Oral  SpO2: 98% 100%  100%  Weight:      Height:        Intake/Output Summary (Last 24 hours) at 10/18/2019 1506 Last data filed at 10/18/2019 0929 Gross per 24 hour  Intake 1590 ml  Output 755 ml  Net 835 ml   Filed Weights   10/16/19 1001 10/18/19 0500  Weight: 54.4 kg 51.9 kg    Examination:  General:  No acute distress. Cardiovascular: Heart sounds show Jylan Loeza regular rate, and rhythm.  Lungs: Clear to auscultation bilaterally  Abdomen: Soft, nontender, nondistended  Neurological: Alert and oriented 3. Moves all extremities 4. Cranial nerves II through XII grossly intact. Skin: Warm and dry. No rashes or lesions. Extremities: No clubbing or cyanosis. No edema    Data Reviewed: I have personally reviewed following labs and imaging studies  CBC: Recent Labs  Lab 10/14/19 1533 10/14/19 1533 10/15/19 0406 10/16/19 0521 10/17/19 0549 10/17/19 1509 10/18/19 0604  WBC 7.4  --  5.9 4.2 4.9  --  5.8  NEUTROABS  --   --  3.9 2.3 3.0  --  3.6  HGB 11.2*   < > 9.6* 9.9* 8.5* 8.3* 8.0*  HCT 33.6*   < > 29.6* 30.2* 26.3* 25.2* 24.3*  MCV 114.3*  --  115.6* 115.7* 115.9*  --  114.1*  PLT 64*  --  43* 47* 65*  --  82*   < > = values in  this interval not displayed.    Basic Metabolic Panel: Recent Labs  Lab 10/14/19 1533 10/14/19 1533 10/15/19 0215 10/15/19 0215 10/15/19 0406 10/15/19 2100 10/16/19 0521 10/17/19 0549 10/18/19 0604  NA 136   < > 137   < > 137 135 134* 136 138  K 2.7*   < > 4.0   < > 3.7 4.2 3.6 3.2* 3.6  CL 96*   < > 101   < > 103 105 107 103 104  CO2 23   < > 21*   < > 20* 20* 20* 25 26  GLUCOSE 93   < > 98   < > 86 106* 84 101* 93  BUN 27*   < > 29*   < > 29* _0 CREATININE 1.54*   < > 1.57*   < > 1.38* 0.85 0.69 0.45* 0.55*  CALCIUM 8.8*   < > 7.9*   < > 7.6* 8.6* 8.3* 8.3* 8.4*  MG 1.2*  --   --   --  2.0  --  1.5* 1.7 1.4*  PHOS  --   --  3.0  --  2.7  --  2.2* 2.3* 2.4*   < > = values in this interval not displayed.    GFR: Estimated Creatinine Clearance: 73.9 mL/min (Kirstein Baxley) (by C-G formula based on SCr of 0.55 mg/dL (L)).  Liver Function Tests: Recent Labs  Lab 10/14/19 1533 10/15/19 0406 10/16/19 0521 10/17/19 0549 10/18/19 0604  AST 79* 71* 80* 98* 90*  ALT 38 31 33 39 40  ALKPHOS 115 94 94 92 87  BILITOT 2.2* 1.6* 1.7* 1.1 1.0  PROT 7.7 5.8* 5.8* 5.9* 5.8*  ALBUMIN 3.9 2.9* 2.9* 2.9* 3.0*    CBG: No results for input(s): GLUCAP in the last 168 hours.   Recent Results (from the past 240 hour(s))  SARS Coronavirus 2 by RT PCR (hospital order, performed in [facility] hospital lab) Nasopharyngeal Nasopharyngeal Swab     Status: None   Collection Time: 10/15/19 12:22 AM   Specimen: Nasopharyngeal Swab  Result Value Ref Range Status   SARS Coronavirus 2 NEGATIVE NEGATIVE Final    Comment: (NOTE) SARS-CoV-2 target nucleic acids are NOT DETECTED.  The SARS-CoV-2 RNA is generally detectable in upper and lower respiratory specimens during the acute phase of infection. The lowest concentration of SARS-CoV-2 viral copies this assay can detect is 250 copies / mL. Brittan Mapel negative result does not preclude SARS-CoV-2 infection  and should not be used as the sole basis for  treatment or other patient management decisions.  Cleophus Mendonsa negative result may occur with improper specimen collection / handling, submission of specimen other than nasopharyngeal swab, presence of viral mutation(s) within the areas targeted by this assay, and inadequate number of viral copies (<250 copies / mL). Alcides Nutting negative result must be combined with clinical observations, patient history, and epidemiological information.  Fact Sheet for Patients:   StrictlyIdeas.no  Fact Sheet for Healthcare Providers: BankingDealers.co.za  This test is not yet approved or  cleared by the [place] FDA and has been authorized for detection and/or diagnosis of SARS-CoV-2 by FDA under an Emergency Use Authorization (EUA).  This EUA will remain in effect (meaning this test can be used) for the duration of the COVID-19 declaration under Section 564(b)(1) of the Act, 21 U.S.C. section 360bbb-3(b)(1), unless the authorization is terminated or revoked sooner.  Performed at [facility], [place] [address] [place], [place] [address]   Blood culture (routine x 2)     Status: Abnormal   Collection Time: 10/15/19 12:45 AM   Specimen: BLOOD  Result Value Ref Range Status   Specimen Description   Final    BLOOD BLOOD RIGHT ARM Performed at [facility] [address] [place], [place] [address]    Special Requests   Final    BOTTLES DRAWN AEROBIC AND ANAEROBIC Blood Culture results may not be optimal due to an inadequate volume of blood received in culture bottles Performed at [facility] [address] [place], [place] [address]    Culture  Setup Time   Final    GRAM POSITIVE COCCI IN CLUSTERS IN BOTH AEROBIC AND ANAEROBIC BOTTLES CRITICAL VALUE NOTED.  VALUE IS CONSISTENT WITH PREVIOUSLY REPORTED AND CALLED VALUE.    Culture (Worth Kober)  Final    STAPHYLOCOCCUS HOMINIS SUSCEPTIBILITIES PERFORMED ON  PREVIOUS CULTURE WITHIN THE LAST 5 DAYS. Performed at [facility], [place] [address] [place], [place] [address]    Report Status 10/18/2019 FINAL  Final  Blood culture (routine x 2)     Status: Abnormal   Collection Time: 10/15/19 12:45 AM   Specimen: BLOOD  Result Value Ref Range Status   Specimen Description   Final    BLOOD BLOOD LEFT FOREARM Performed at [facility] [address] [place], [place] [address]    Special Requests   Final    BOTTLES DRAWN AEROBIC AND ANAEROBIC Blood Culture adequate volume Performed at [facility] [address] [place], [place] [address]    Culture  Setup Time   Final    GRAM POSITIVE COCCI IN CLUSTERS AEROBIC BOTTLE ONLY Organism ID to follow CRITICAL RESULT CALLED TO, READ BACK BY AND VERIFIED WITH: Elenore Paddy PHARMD [id number] 10/15/19 Macon Lesesne BROWNING Performed at [facility], [place] [address] [place],  [address]    Culture STAPHYLOCOCCUS HOMINIS (Milissa Fesperman)  Final   Report Status 10/17/2019 FINAL  Final   Organism ID, Bacteria STAPHYLOCOCCUS HOMINIS  Final      Susceptibility   Staphylococcus hominis - MIC*    CIPROFLOXACIN <=0.5 SENSITIVE Sensitive     ERYTHROMYCIN INTERMEDIATE Intermediate     GENTAMICIN <=0.5 SENSITIVE Sensitive     OXACILLIN <=0.25 SENSITIVE Sensitive     TETRACYCLINE <=1 SENSITIVE Sensitive     VANCOMYCIN <=0.5 SENSITIVE Sensitive     TRIMETH/SULFA <=10 SENSITIVE Sensitive     CLINDAMYCIN >=8 RESISTANT  Resistant     RIFAMPIN <=0.5 SENSITIVE Sensitive     Inducible Clindamycin NEGATIVE Sensitive     * STAPHYLOCOCCUS HOMINIS  Blood Culture ID Panel (Reflexed)     Status: Abnormal   Collection Time: 10/15/19 12:45 AM  Result Value Ref Range Status   Enterococcus species NOT DETECTED NOT DETECTED Final   Listeria monocytogenes NOT DETECTED NOT DETECTED Final   Staphylococcus species DETECTED (Emmitt Matthews) NOT DETECTED Final    Comment: Methicillin (oxacillin) resistant coagulase  negative staphylococcus. Possible blood culture contaminant (unless isolated from more than one blood culture draw or clinical case suggests pathogenicity). No antibiotic treatment is indicated for blood  culture contaminants. CRITICAL RESULT CALLED TO, READ BACK BY AND VERIFIED WITH: L POINDEXTER PHARMD 2328 10/15/19 Shella Lahman BROWNING    Staphylococcus aureus (BCID) NOT DETECTED NOT DETECTED Final   Methicillin resistance DETECTED (Tanaka Gillen) NOT DETECTED Final    Comment: CRITICAL RESULT CALLED TO, READ BACK BY AND VERIFIED WITH: L POINDEXTER PHARMD 2328 10/15/19 Bashir Marchetti BROWNING    Streptococcus species NOT DETECTED NOT DETECTED Final   Streptococcus agalactiae NOT DETECTED NOT DETECTED Final   Streptococcus pneumoniae NOT DETECTED NOT DETECTED Final   Streptococcus pyogenes NOT DETECTED NOT DETECTED Final   Acinetobacter baumannii NOT DETECTED NOT DETECTED Final   Enterobacteriaceae species NOT DETECTED NOT DETECTED Final   Enterobacter cloacae complex NOT DETECTED NOT DETECTED Final   Escherichia coli NOT DETECTED NOT DETECTED Final   Klebsiella oxytoca NOT DETECTED NOT DETECTED Final   Klebsiella pneumoniae NOT DETECTED NOT DETECTED Final   Proteus species NOT DETECTED NOT DETECTED Final   Serratia marcescens NOT DETECTED NOT DETECTED Final   Haemophilus influenzae NOT DETECTED NOT DETECTED Final   Neisseria meningitidis NOT DETECTED NOT DETECTED Final   Pseudomonas aeruginosa NOT DETECTED NOT DETECTED Final   Candida albicans NOT DETECTED NOT DETECTED Final   Candida glabrata NOT DETECTED NOT DETECTED Final   Candida krusei NOT DETECTED NOT DETECTED Final   Candida parapsilosis NOT DETECTED NOT DETECTED Final   Candida tropicalis NOT DETECTED NOT DETECTED Final    Comment: Performed at [facility], [place] [address], [place], [place] [address]  Culture, Urine     Status: Abnormal   Collection Time: 10/15/19  2:15 AM   Specimen: Urine, Clean Catch  Result Value Ref Range Status   Specimen  Description   Final    URINE, CLEAN CATCH Performed at [facility], [place] [address] [place], [place] [address]    Special Requests   Final    NONE Performed at [facility], [place] [address] [place], [place] [address]    Culture MULTIPLE SPECIES PRESENT, SUGGEST RECOLLECTION (Arabell Neria)  Final   Report Status 10/16/2019 FINAL  Final  Culture, blood (routine x 2)     Status: None (Preliminary result)   Collection Time: 10/16/19  8:55 AM   Specimen: BLOOD LEFT HAND  Result Value Ref Range Status   Specimen Description   Final    BLOOD LEFT HAND Performed at [facility] [address] [place], [place] [address]    Special Requests   Final    BOTTLES DRAWN AEROBIC ONLY Blood Culture adequate volume Performed at [facility] [address] [place], [place] [address]    Culture   Final    NO GROWTH 2 DAYS Performed at [facility] [address], [place], [place] [address]    Report Status PENDING  Incomplete  Culture, blood (routine x 2)     Status: None (Preliminary result)   Collection Time: 10/16/19  9:28 AM   Specimen: BLOOD  Result Value Ref Range Status   Specimen Description   Final    BLOOD RIGHT ARM Performed at [facility] [address], [place], [place] [address]    Special Requests   Final    BOTTLES DRAWN AEROBIC ONLY Blood Culture adequate volume Performed at [facility] [address], [place], [place] [address]    Culture   Final    NO GROWTH 2 DAYS Performed at [facility] [address] [place], [place] [address]    Report Status PENDING  Incomplete         Radiology Studies: No results found.      Scheduled Meds: . docusate sodium  100 mg Oral BID  . feeding supplement  1 Container Oral TID BM  . feeding supplement (KATE FARMS STANDARD 1.4)  325 mL Oral Daily  . [START ON 10/19/2019] ferrous sulfate  325 mg Oral Q  breakfast  . fluconazole  150 mg Oral Daily  . folic acid  1 mg Oral Daily  . multivitamin with minerals  1 tablet Oral Daily  . nicotine  21 mg Transdermal Daily  . sodium chloride flush  3 mL Intravenous Q12H   Continuous Infusions: . sodium chloride 50 mL/hr at 10/18/19 0531  . DAPTOmycin (CUBICIN)  IV 500 mg (10/17/19 2015)  . thiamine injection 500 mg (10/18/19 1446)     LOS: 3 days    Time spent: over 30 min    Fayrene Helper, MD [facility]   To contact the attending provider between 7A-7P or the covering provider during after hours 7P-7A, please log into the web site [email or web address] and access using universal [place] password for that web site. If you do not have the password, please call the hospital operator.  10/18/2019, 3:06 PM

## 2019-10-19 LAB — COMPREHENSIVE METABOLIC PANEL
ALT: 37 U/L (ref 0–44)
AST: 80 U/L — ABNORMAL HIGH (ref 15–41)
Albumin: 2.8 g/dL — ABNORMAL LOW (ref 3.5–5.0)
Alkaline Phosphatase: 85 U/L (ref 38–126)
Anion gap: 7 (ref 5–15)
BUN: 7 mg/dL (ref 6–20)
CO2: 25 mmol/L (ref 22–32)
Calcium: 8.3 mg/dL — ABNORMAL LOW (ref 8.9–10.3)
Chloride: 104 mmol/L (ref 98–111)
Creatinine, Ser: 0.57 mg/dL — ABNORMAL LOW (ref 0.61–1.24)
GFR calc Af Amer: >60 mL/min (ref >60)
GFR calc non Af Amer: >60 mL/min (ref >60)
Glucose, Bld: 87 mg/dL (ref 70–99)
Potassium: 3.7 mmol/L (ref 3.5–5.1)
Sodium: 136 mmol/L (ref 135–145)
Total Bilirubin: 1.1 mg/dL (ref 0.3–1.2)
Total Protein: 5.9 g/dL — ABNORMAL LOW (ref 6.5–8.1)

## 2019-10-19 LAB — CBC WITH DIFFERENTIAL/PLATELET
Abs Immature Granulocytes: 0.03 10*3/uL (ref 0.00–0.07)
Basophils Absolute: 0 10*3/uL (ref 0.0–0.1)
Basophils Relative: 1 %
Eosinophils Absolute: 0.2 10*3/uL (ref 0.0–0.5)
Eosinophils Relative: 3 %
HCT: 26.4 % — ABNORMAL LOW (ref 39.0–52.0)
Hemoglobin: 8.7 g/dL — ABNORMAL LOW (ref 13.0–17.0)
Immature Granulocytes: 1 %
Lymphocytes Relative: 25 %
Lymphs Abs: 1.5 10*3/uL (ref 0.7–4.0)
MCH: 37.8 pg — ABNORMAL HIGH (ref 26.0–34.0)
MCHC: 33 g/dL (ref 30.0–36.0)
MCV: 114.8 fL — ABNORMAL HIGH (ref 80.0–100.0)
Monocytes Absolute: 0.6 10*3/uL (ref 0.1–1.0)
Monocytes Relative: 11 %
Neutro Abs: 3.4 10*3/uL (ref 1.7–7.7)
Neutrophils Relative %: 59 %
Platelets: 101 10*3/uL — ABNORMAL LOW (ref 150–400)
RBC: 2.3 MIL/uL — ABNORMAL LOW (ref 4.22–5.81)
RDW: 13 % (ref 11.5–15.5)
WBC: 5.7 10*3/uL (ref 4.0–10.5)
nRBC: 0 % (ref 0.0–0.2)

## 2019-10-19 LAB — MAGNESIUM: Magnesium: 1.5 mg/dL — ABNORMAL LOW (ref 1.7–2.4)

## 2019-10-19 LAB — PHOSPHORUS: Phosphorus: 3.1 mg/dL (ref 2.5–4.6)

## 2019-10-19 MED ORDER — ZOLPIDEM TARTRATE 5 MG PO TABS
5.0000 mg | ORAL_TABLET | Freq: Once | ORAL | Status: AC
  Administered 2019-10-19: 5 mg via ORAL
  Filled 2019-10-19: qty 1

## 2019-10-19 MED ORDER — MAGNESIUM SULFATE 2 GM/50ML IV SOLN
2.0000 g | Freq: Once | INTRAVENOUS | Status: AC
  Administered 2019-10-19: 2 g via INTRAVENOUS
  Filled 2019-10-19: qty 50

## 2019-10-19 NOTE — Progress Notes (Signed)
PROGRESS NOTE    Trevor Williams  ZJQ:[id number] DOB: 1960-08-11 DOA: 10/14/2019 PCP: Elsie Stain, MD   Chief Complaint  Patient presents with  . Diarrhea   Brief Narrative: Trevor Williams is Trevor Williams 59 y.o. 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  Assessment & Plan:   Principal Problem:   Positive blood culture Active Problems:   Alcohol use   Homeless   Hypokalemia   Tobacco use disorder   Chronic systolic heart failure (HCC)   Elevated LFTs   Hypomagnesemia   Dehydration   Dizziness   COPD (chronic obstructive pulmonary disease) (HCC)   Macrocytic anemia  59 y.o. male with medical history significant of  EtOH abuse, History of cardiomyopathy. 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  Coagulase Negative Staph Bacteremia: in 3/4 bottles, concerning for bacteremia despite coag negative.   Will hold antibiotics given stability and repeat blood cultures  Pending susceptibility - discussed with micro, 1 blood culture from 7/28 not showing sensitivities - 1 blood culture showing resistance to clindamycin and intermediate sensitivity to erythromycin.  The other, per my discussion with micro, is showing resistance to erythromycin and sensitivity to clindamycin.  Requested micro fax this report other (unfortunately, not showing up on epic).  With different susceptibility patterns, will stop daptomycin. Repeat blood cx no growth to date.  Continue dapto per ID Appreciate ID consult  Follow pending cultures  Concern for Tick Borne  Illness: per RN discussion with pt and pt discussion, pt pulled off 4 ticks this summer.  Most recent Zelena Bushong few weeks ago?  He has Jonaven Hilgers rash, which doesn't appear c/w erythema migrans, appears more consistent with fungal skin infection  He has low platelets (chronic) and elevated LFT's (some chronicity as well), but his general malaise, headaches, weakness continue at this point.   Could consider treating empirically, but has hx rash with doxy, will discuss with pharmacy as appears which medication was causing rash Normal CRP, follow ESR Start doxy, watch for rash - he can't tell me about rash, discussed with pt  Rash  Tinea Corporis: confluent erythematous plaque on back and abdomen with overlying scale, more defined/raised border.  Suspect this is most likely fungal, doesn't appear to be erythema migrans. Start fluconazole  Diarrhea: follow fecal lactoferrin, consider additional w/u if continued diarrhea  Dizziness  Lightheadedness  Volume Depletion:  Check orthostatics (negative), continue gentle hydration MRI without acute intracranial abnormality Physical therapy Continued malaise, improving, follow on therapy  Chronic systolic heart failure ([place]) -monitor for any sign of fluid overload Echo from 04/2019 notable for EF 30% Echo 7/28 with EF 60-65%, impaired diastolic function (see report) I/O, daily weights  Pyuria: in setting of chills, follow urine culture and treat as indicated.  He's afebrile without leukocytosis.  Follow urine culture (multiple species).  Blood cultures as above.  AKI  Proteinuria Baseline creatinine <1 At presentation 1.57, improving with IVF Urine protein/creatinine ratio, 0.15 Follow renal US with trace pararenal fluid on L  Alcohol abuse.  Will order CIWA protocol monitor for any signs of withdrawal.  High dose thiamine.  Elevated LFTs most likely secondary to alcohol abuse [place] notable for cholelithiasis without  cholecystitis and steatosis Negative HIV  and acute hepatitis panel  Hypokalemia - improved - replcae and follow  Hypomagnesemia - replace and follow  Iron Deficiency Anemia: downtrending, no obvious signs of bleeding.  Iron supplementation  Tobacco use disorder -  encourage cessation  Thrombocytopenia most likely secondary to alcohol abuse and ?liver disease - improving, continue to monitor  Chronic Frontotemporal Encephalomalacia: post ischemic vs post traumatic   DVT prophylaxis: SCD Code Status: full Family Communication: none Disposition:   Status is: Observation  The patient will require care spanning > 2 midnights and should be moved to inpatient because: IV treatments appropriate due to intensity of illness or inability to take PO  Dispo: The patient is from: Home              Anticipated d/c is to: Home              Anticipated d/c date is: > 3 days              Patient currently is not medically stable to d/c.       Consultants:   none  Procedures:  Echo IMPRESSIONS    1. Left ventricular ejection fraction, by estimation, is 60 to 65%. The  left ventricle has normal function. The left ventricle has no regional  wall motion abnormalities. Left ventricular diastolic parameters are  consistent with Grade I diastolic  dysfunction (impaired relaxation).  2. Right ventricular systolic function is normal. The right ventricular  size is normal. There is normal pulmonary artery systolic pressure.  3. Left atrial size was moderately dilated.  4. The mitral valve is normal in structure. No evidence of mitral valve  regurgitation. No evidence of mitral stenosis.  5. The aortic valve is tricuspid. Aortic valve regurgitation is not  visualized. No aortic stenosis is present.  6. Aortic dilatation noted. There is mild dilatation at the level of the  sinuses of Valsalva measuring 41 mm.  7. The inferior vena cava is normal in size with greater than 50%  respiratory variability, suggesting right  atrial pressure of 3 mmHg.  Antimicrobials: Anti-infectives (From admission, onward)   Start     Dose/Rate Route Frequency Ordered Stop   10/18/19 1800  doxycycline (VIBRA-TABS) tablet 100 mg     Discontinue     100 mg Oral Every 12 hours 10/18/19 1705     10/18/19 1600  fluconazole (DIFLUCAN) tablet 150 mg     Discontinue     150 mg Oral Daily 10/18/19 1443 11/01/19 0959   10/16/19 1500  DAPTOmycin (CUBICIN) 500 mg in sodium chloride 0.9 % IVPB  Status:  Discontinued        500 mg 220 mL/hr over 30 Minutes Intravenous Daily 10/16/19 1332 10/18/19 1508   10/16/19 1300  vancomycin (VANCOREADY) IVPB 750 mg/150 mL  Status:  Discontinued        750 mg 150 mL/hr over 60 Minutes Intravenous Every 12 hours 10/16/19 1227 10/16/19 1328         Subjective: Continues to not feel well, maybe slightly better  Objective: Vitals:   10/18/19 2046 10/19/19 0549 10/19/19 0555 10/19/19 1257  BP: (!) 130/70 113/78  118/75  Pulse: 83 96  87  Resp: _0 Temp: 98.3 F (36.8 C) 98.8 F (37.1 C)  99.4 F (37.4 C)  TempSrc: Oral Oral  Oral  SpO2: 98% 97%  98%  Weight:   53.1 kg   Height:  Intake/Output Summary (Last 24 hours) at 10/19/2019 1608 Last data filed at 10/19/2019 1400 Gross per 24 hour  Intake 2153.05 ml  Output 500 ml  Net 1653.05 ml   Filed Weights   10/16/19 1001 10/18/19 0500 10/19/19 0555  Weight: 54.4 kg 51.9 kg 53.1 kg    Examination:  General: No acute distress. Cardiovascular: Heart sounds show Shellia Hartl regular rate, and rhythm Lungs: Clear to auscultation bilaterally  Abdomen: Soft, nontender, nondistended  Neurological: Alert and oriented 3. Moves all extremities 4 . Cranial nerves II through XII grossly intact. Skin: erythematous plaque with overlying scale  Extremities: No clubbing or cyanosis. No edema.      Data Reviewed: I have personally reviewed following labs and imaging studies  CBC: Recent Labs  Lab 10/15/19 0406 10/15/19 0406  10/16/19 0521 10/17/19 0549 10/17/19 1509 10/18/19 0604 10/19/19 0607  WBC 5.9  --  4.2 4.9  --  5.8 5.7  NEUTROABS 3.9  --  2.3 3.0  --  3.6 3.4  HGB 9.6*   < > 9.9* 8.5* 8.3* 8.0* 8.7*  HCT 29.6*   < > 30.2* 26.3* 25.2* 24.3* 26.4*  MCV 115.6*  --  115.7* 115.9*  --  114.1* 114.8*  PLT 43*  --  47* 65*  --  82* 101*   < > = values in this interval not displayed.    Basic Metabolic Panel: Recent Labs  Lab 10/15/19 0406 10/15/19 0406 10/15/19 2100 10/16/19 0521 10/17/19 0549 10/18/19 0604 10/19/19 0607  NA 137   < > 135 134* 136 138 136  K 3.7   < > 4.2 3.6 3.2* 3.6 3.7  CL 103   < > 105 107 103 104 104  CO2 20*   < > 20* 20* _0 GLUCOSE 86   < > 106* 84 101* 93 87  BUN 29*   < > _1 CREATININE 1.38*   < > 0.85 0.69 0.45* 0.55* 0.57*  CALCIUM 7.6*   < > 8.6* 8.3* 8.3* 8.4* 8.3*  MG 2.0  --   --  1.5* 1.7 1.4* 1.5*  PHOS 2.7  --   --  2.2* 2.3* 2.4* 3.1   < > = values in this interval not displayed.    GFR: Estimated Creatinine Clearance: 75.6 mL/min (Jaret Coppedge) (by C-G formula based on SCr of 0.57 mg/dL (L)).  Liver Function Tests: Recent Labs  Lab 10/15/19 0406 10/16/19 0521 10/17/19 0549 10/18/19 0604 10/19/19 0607  AST 71* 80* 98* 90* 80*  ALT 31 33 39 40 37  ALKPHOS 94 94 92 87 85  BILITOT 1.6* 1.7* 1.1 1.0 1.1  PROT 5.8* 5.8* 5.9* 5.8* 5.9*  ALBUMIN 2.9* 2.9* 2.9* 3.0* 2.8*    CBG: No results for input(s): GLUCAP in the last 168 hours.   Recent Results (from the past 240 hour(s))  SARS Coronavirus 2 by RT PCR (hospital order, performed in [facility] hospital lab) Nasopharyngeal Nasopharyngeal Swab     Status: None   Collection Time: 10/15/19 12:22 AM   Specimen: Nasopharyngeal Swab  Result Value Ref Range Status   SARS Coronavirus 2 NEGATIVE NEGATIVE Final    Comment: (NOTE) SARS-CoV-2 target nucleic acids are NOT DETECTED.  The SARS-CoV-2 RNA is generally detectable in upper and lower respiratory specimens during the acute phase of  infection. The lowest concentration of SARS-CoV-2 viral copies this assay can detect is 250 copies / mL. Jaleal Schliep negative result does not preclude SARS-CoV-2 infection  and should not be used as the sole basis for treatment or other patient management decisions.  Jarett Dralle negative result may occur with improper specimen collection / handling, submission of specimen other than nasopharyngeal swab, presence of viral mutation(s) within the areas targeted by this assay, and inadequate number of viral copies (<250 copies / mL). Jameisha Stofko negative result must be combined with clinical observations, patient history, and epidemiological information.  Fact Sheet for Patients:   StrictlyIdeas.no  Fact Sheet for Healthcare Providers: BankingDealers.co.za  This test is not yet approved or  cleared by the [place] FDA and has been authorized for detection and/or diagnosis of SARS-CoV-2 by FDA under an Emergency Use Authorization (EUA).  This EUA will remain in effect (meaning this test can be used) for the duration of the COVID-19 declaration under Section 564(b)(1) of the Act, 21 U.S.C. section 360bbb-3(b)(1), unless the authorization is terminated or revoked sooner.  Performed at [facility], [place] [address], [place], [place] [address]   Blood culture (routine x 2)     Status: Abnormal   Collection Time: 10/15/19 12:45 AM   Specimen: BLOOD  Result Value Ref Range Status   Specimen Description   Final    BLOOD BLOOD RIGHT ARM Performed at [facility] [address] [place], [place] [address]    Special Requests   Final    BOTTLES DRAWN AEROBIC AND ANAEROBIC Blood Culture results may not be optimal due to an inadequate volume of blood received in culture bottles Performed at [facility] [address], [place], [place] [address]    Culture  Setup Time   Final    GRAM POSITIVE COCCI IN CLUSTERS IN  BOTH AEROBIC AND ANAEROBIC BOTTLES CRITICAL VALUE NOTED.  VALUE IS CONSISTENT WITH PREVIOUSLY REPORTED AND CALLED VALUE.    Culture (Aleta Manternach)  Final    STAPHYLOCOCCUS HOMINIS SUSCEPTIBILITIES PERFORMED ON PREVIOUS CULTURE WITHIN THE LAST 5 DAYS. Performed at [facility], [place] [address] [place], [place] [address]    Report Status 10/18/2019 FINAL  Final  Blood culture (routine x 2)     Status: Abnormal   Collection Time: 10/15/19 12:45 AM   Specimen: BLOOD  Result Value Ref Range Status   Specimen Description   Final    BLOOD BLOOD LEFT FOREARM Performed at [facility] [address] [place], [place] [address]    Special Requests   Final    BOTTLES DRAWN AEROBIC AND ANAEROBIC Blood Culture adequate volume Performed at [facility] [address], [place], [place] [address]    Culture  Setup Time   Final    GRAM POSITIVE COCCI IN CLUSTERS AEROBIC BOTTLE ONLY Organism ID to follow CRITICAL RESULT CALLED TO, READ BACK BY AND VERIFIED WITH: Elenore Paddy PHARMD 2328 10/15/19 Henri Guedes BROWNING Performed at [facility], [place] [address], [place], [place] [address]    Culture STAPHYLOCOCCUS HOMINIS (Lex Linhares)  Final   Report Status 10/17/2019 FINAL  Final   Organism ID, Bacteria STAPHYLOCOCCUS HOMINIS  Final      Susceptibility   Staphylococcus hominis - MIC*    CIPROFLOXACIN <=0.5 SENSITIVE Sensitive     ERYTHROMYCIN INTERMEDIATE Intermediate     GENTAMICIN <=0.5 SENSITIVE Sensitive     OXACILLIN <=0.25 SENSITIVE Sensitive     TETRACYCLINE <=1 SENSITIVE Sensitive     VANCOMYCIN <=0.5 SENSITIVE Sensitive     TRIMETH/SULFA <=10 SENSITIVE Sensitive     CLINDAMYCIN >=8 RESISTANT Resistant  RIFAMPIN <=0.5 SENSITIVE Sensitive     Inducible Clindamycin NEGATIVE Sensitive     * STAPHYLOCOCCUS HOMINIS  Blood Culture ID Panel (Reflexed)     Status: Abnormal   Collection Time: 10/15/19 12:45 AM  Result Value Ref Range Status   Enterococcus species NOT  DETECTED NOT DETECTED Final   Listeria monocytogenes NOT DETECTED NOT DETECTED Final   Staphylococcus species DETECTED (Aven Cegielski) NOT DETECTED Final    Comment: Methicillin (oxacillin) resistant coagulase negative staphylococcus. Possible blood culture contaminant (unless isolated from more than one blood culture draw or clinical case suggests pathogenicity). No antibiotic treatment is indicated for blood  culture contaminants. CRITICAL RESULT CALLED TO, READ BACK BY AND VERIFIED WITH: L POINDEXTER PHARMD 2328 10/15/19 Rozanne Heumann BROWNING    Staphylococcus aureus (BCID) NOT DETECTED NOT DETECTED Final   Methicillin resistance DETECTED (Brysin Towery) NOT DETECTED Final    Comment: CRITICAL RESULT CALLED TO, READ BACK BY AND VERIFIED WITH: L POINDEXTER PHARMD 2328 10/15/19 Lashauna Arpin BROWNING    Streptococcus species NOT DETECTED NOT DETECTED Final   Streptococcus agalactiae NOT DETECTED NOT DETECTED Final   Streptococcus pneumoniae NOT DETECTED NOT DETECTED Final   Streptococcus pyogenes NOT DETECTED NOT DETECTED Final   Acinetobacter baumannii NOT DETECTED NOT DETECTED Final   Enterobacteriaceae species NOT DETECTED NOT DETECTED Final   Enterobacter cloacae complex NOT DETECTED NOT DETECTED Final   Escherichia coli NOT DETECTED NOT DETECTED Final   Klebsiella oxytoca NOT DETECTED NOT DETECTED Final   Klebsiella pneumoniae NOT DETECTED NOT DETECTED Final   Proteus species NOT DETECTED NOT DETECTED Final   Serratia marcescens NOT DETECTED NOT DETECTED Final   Haemophilus influenzae NOT DETECTED NOT DETECTED Final   Neisseria meningitidis NOT DETECTED NOT DETECTED Final   Pseudomonas aeruginosa NOT DETECTED NOT DETECTED Final   Candida albicans NOT DETECTED NOT DETECTED Final   Candida glabrata NOT DETECTED NOT DETECTED Final   Candida krusei NOT DETECTED NOT DETECTED Final   Candida parapsilosis NOT DETECTED NOT DETECTED Final   Candida tropicalis NOT DETECTED NOT DETECTED Final    Comment: Performed at [facility], [place]. [address], [place], [place] [address]  Culture, Urine     Status: Abnormal   Collection Time: 10/15/19  2:15 AM   Specimen: Urine, Clean Catch  Result Value Ref Range Status   Specimen Description   Final    URINE, CLEAN CATCH Performed at [facility], [place] [address] [place], [place] [address]    Special Requests   Final    NONE Performed at [facility], [place] [address] [place], [place] [address]    Culture MULTIPLE SPECIES PRESENT, SUGGEST RECOLLECTION (Deleah Tison)  Final   Report Status 10/16/2019 FINAL  Final  Culture, blood (routine x 2)     Status: None (Preliminary result)   Collection Time: 10/16/19  8:55 AM   Specimen: BLOOD LEFT HAND  Result Value Ref Range Status   Specimen Description   Final    BLOOD LEFT HAND Performed at [facility] [address] [place], [place] [address]    Special Requests   Final    BOTTLES DRAWN AEROBIC ONLY Blood Culture adequate volume Performed at [facility] [address], [place], [place] [address]    Culture   Final    NO GROWTH 3 DAYS Performed at [facility], [place] [address], [place], [place] [address]    Report Status PENDING  Incomplete  Culture, blood (routine x  2)     Status: None (Preliminary result)   Collection Time: 10/16/19  9:28 AM   Specimen: BLOOD  Result Value Ref Range Status   Specimen Description   Final    BLOOD RIGHT ARM Performed at [facility] [address] [place], [place] [address]    Special Requests   Final    BOTTLES DRAWN AEROBIC ONLY Blood Culture adequate volume Performed at [facility] [address] [place], [place] [address]    Culture   Final    NO GROWTH 3 DAYS Performed at [facility], [place] [address], [place], [place] [address]    Report Status PENDING  Incomplete         Radiology Studies: No results found.      Scheduled Meds: . docusate  sodium  100 mg Oral BID  . doxycycline  100 mg Oral Q12H  . feeding supplement  1 Container Oral TID BM  . feeding supplement (KATE FARMS STANDARD 1.4)  325 mL Oral Daily  . ferrous sulfate  325 mg Oral Q breakfast  . fluconazole  150 mg Oral Daily  . folic acid  1 mg Oral Daily  . multivitamin with minerals  1 tablet Oral Daily  . nicotine  21 mg Transdermal Daily  . sodium chloride flush  3 mL Intravenous Q12H   Continuous Infusions: . sodium chloride 50 mL/hr at 10/19/19 1535  . thiamine injection 100 mL/hr at 10/19/19 1400     LOS: 4 days    Time spent: over 30 min    Fayrene Helper, MD [facility]   To contact the attending provider between 7A-7P or the covering provider during after hours 7P-7A, please log into the web site [email or web address] and access using universal [place] password for that web site. If you do not have the password, please call the hospital operator.  10/19/2019, 4:08 PM

## 2019-10-20 ENCOUNTER — Telehealth (HOSPITAL_COMMUNITY): Payer: Self-pay | Admitting: Licensed Clinical Social Worker

## 2019-10-20 ENCOUNTER — Inpatient Hospital Stay (HOSPITAL_COMMUNITY): Payer: Medicaid Other

## 2019-10-20 LAB — CBC WITH DIFFERENTIAL/PLATELET
Abs Immature Granulocytes: 0.02 10*3/uL (ref 0.00–0.07)
Basophils Absolute: 0 10*3/uL (ref 0.0–0.1)
Basophils Relative: 1 %
Eosinophils Absolute: 0.1 10*3/uL (ref 0.0–0.5)
Eosinophils Relative: 2 %
HCT: 22.8 % — ABNORMAL LOW (ref 39.0–52.0)
Hemoglobin: 7.4 g/dL — ABNORMAL LOW (ref 13.0–17.0)
Immature Granulocytes: 0 %
Lymphocytes Relative: 26 %
Lymphs Abs: 1.2 10*3/uL (ref 0.7–4.0)
MCH: 37.2 pg — ABNORMAL HIGH (ref 26.0–34.0)
MCHC: 32.5 g/dL (ref 30.0–36.0)
MCV: 114.6 fL — ABNORMAL HIGH (ref 80.0–100.0)
Monocytes Absolute: 0.8 10*3/uL (ref 0.1–1.0)
Monocytes Relative: 17 %
Neutro Abs: 2.5 10*3/uL (ref 1.7–7.7)
Neutrophils Relative %: 54 %
Platelets: 98 10*3/uL — ABNORMAL LOW (ref 150–400)
RBC: 1.99 MIL/uL — ABNORMAL LOW (ref 4.22–5.81)
RDW: 12.9 % (ref 11.5–15.5)
WBC: 4.6 10*3/uL (ref 4.0–10.5)
nRBC: 0 % (ref 0.0–0.2)

## 2019-10-20 LAB — COMPREHENSIVE METABOLIC PANEL
ALT: 35 U/L (ref 0–44)
AST: 83 U/L — ABNORMAL HIGH (ref 15–41)
Albumin: 2.7 g/dL — ABNORMAL LOW (ref 3.5–5.0)
Alkaline Phosphatase: 84 U/L (ref 38–126)
Anion gap: 7 (ref 5–15)
BUN: 8 mg/dL (ref 6–20)
CO2: 25 mmol/L (ref 22–32)
Calcium: 8 mg/dL — ABNORMAL LOW (ref 8.9–10.3)
Chloride: 103 mmol/L (ref 98–111)
Creatinine, Ser: 0.61 mg/dL (ref 0.61–1.24)
GFR calc Af Amer: >60 mL/min (ref >60)
GFR calc non Af Amer: >60 mL/min (ref >60)
Glucose, Bld: 82 mg/dL (ref 70–99)
Potassium: 4.2 mmol/L (ref 3.5–5.1)
Sodium: 135 mmol/L (ref 135–145)
Total Bilirubin: 1 mg/dL (ref 0.3–1.2)
Total Protein: 5.5 g/dL — ABNORMAL LOW (ref 6.5–8.1)

## 2019-10-20 LAB — URINALYSIS, ROUTINE W REFLEX MICROSCOPIC
Bilirubin Urine: NEGATIVE
Glucose, UA: NEGATIVE mg/dL
Hgb urine dipstick: NEGATIVE
Ketones, ur: NEGATIVE mg/dL
Leukocytes,Ua: NEGATIVE
Nitrite: NEGATIVE
Protein, ur: NEGATIVE mg/dL
Specific Gravity, Urine: 1.018 (ref 1.005–1.030)
pH: 6 (ref 5.0–8.0)

## 2019-10-20 LAB — HEMOGLOBIN AND HEMATOCRIT, BLOOD
HCT: 25.5 % — ABNORMAL LOW (ref 39.0–52.0)
Hemoglobin: 8.3 g/dL — ABNORMAL LOW (ref 13.0–17.0)

## 2019-10-20 LAB — LACTOFERRIN, FECAL, QUALITATIVE: Lactoferrin, Fecal, Qual: NEGATIVE

## 2019-10-20 LAB — MAGNESIUM: Magnesium: 1.5 mg/dL — ABNORMAL LOW (ref 1.7–2.4)

## 2019-10-20 LAB — B. BURGDORFI ANTIBODIES: B burgdorferi Ab IgG+IgM: <0.91 {ISR} (ref 0.00–0.90)

## 2019-10-20 LAB — PHOSPHORUS: Phosphorus: 3.2 mg/dL (ref 2.5–4.6)

## 2019-10-20 MED ORDER — MAGNESIUM SULFATE 2 GM/50ML IV SOLN
2.0000 g | Freq: Once | INTRAVENOUS | Status: AC
  Administered 2019-10-20: 2 g via INTRAVENOUS
  Filled 2019-10-20: qty 50

## 2019-10-20 MED ORDER — TRAZODONE HCL 100 MG PO TABS
100.0000 mg | ORAL_TABLET | Freq: Every day | ORAL | Status: DC
  Administered 2019-10-20 – 2019-10-23 (×4): 100 mg via ORAL
  Filled 2019-10-20 (×4): qty 1

## 2019-10-20 NOTE — Progress Notes (Signed)
PROGRESS NOTE    Trevor Williams  ZOX:[id number] DOB: Sep 14, 1960 DOA: 10/14/2019 PCP: Trevor Stain, MD   Chief Complaint  Patient presents with   Diarrhea   Brief Narrative: Trevor Williams is Trevor Williams 59 y.o. 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  Assessment & Plan:   Principal Problem:   Positive blood culture Active Problems:   Alcohol use   Homeless   Hypokalemia   Tobacco use disorder   Chronic systolic heart failure (HCC)   Elevated LFTs   Hypomagnesemia   Dehydration   Dizziness   COPD (chronic obstructive pulmonary disease) (HCC)   Macrocytic anemia  59 y.o. male with medical history significant of  EtOH abuse, History of cardiomyopathy. 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  Coagulase Negative Staph Bacteremia: in 3/4 bottles, concerning for bacteremia despite coag negative.   Will hold antibiotics given stability and repeat blood cultures  Pending susceptibility - discussed with micro, 1 blood culture from 7/28 not showing sensitivities - 1 blood culture showing resistance to clindamycin and intermediate sensitivity to erythromycin.  The other, per my discussion with micro, is showing resistance to erythromycin and sensitivity to clindamycin.  Requested micro fax this report other (unfortunately, not showing up on epic).  With different susceptibility patterns, will stop daptomycin. Repeat blood cx no growth to date.  Recurrent fever 8/2, feels poorly today, CXR without abnormality, UA negative - will continue to  monitor Dapto discontinued Appreciate ID consult   Concern for Tick Borne Illness: per RN discussion with pt and pt discussion, pt pulled off 4 ticks this summer.  Most recent Trevor Williams few weeks ago?  He has Trevor Williams rash, which doesn't appear c/w erythema migrans, appears more consistent with fungal skin infection  He has low platelets (chronic) and elevated LFT's (some chronicity as well), but his general malaise, headaches, weakness continue at this point.   ID recommended d/c doxycycline, will follow off doxy  Rash   Tinea Corporis: confluent erythematous plaque on back and abdomen with overlying scale, more defined/raised border.  Suspect this is most likely fungal, doesn't appear to be erythema migrans. Start fluconazole  Diarrhea: follow fecal lactoferrin, consider additional w/u if continued diarrhea  Dizziness   Lightheadedness   Volume Depletion:  Check orthostatics (negative), continue gentle hydration MRI without acute intracranial abnormality Physical therapy Continued malaise, improving, follow on therapy  Chronic systolic heart failure ([place]) -monitor for any sign of fluid overload Echo from 04/2019 notable for EF 30% Echo 7/28 with EF 60-65%, impaired diastolic function (see report) I/O, daily weights  Pyuria: in setting of chills, follow urine culture and treat as indicated.  He's afebrile without leukocytosis.  Follow urine culture (multiple species).  Blood cultures as above.  AKI   Proteinuria Baseline creatinine <1 At presentation 1.57, improving with IVF Urine protein/creatinine ratio, 0.15 Follow renal US with trace pararenal fluid on Trevor  Alcohol abuse.  Will order CIWA protocol monitor for any signs of withdrawal.  High dose thiamine.  Elevated LFTs most likely secondary to alcohol abuse [place] notable for cholelithiasis without cholecystitis and steatosis Negative HIV and acute hepatitis panel  Hypokalemia - improved - replcae and follow  Hypomagnesemia - replace and  follow  Iron Deficiency Anemia: downtrending, no obvious signs of bleeding.  Iron supplementation  Tobacco use disorder -  encourage cessation  Thrombocytopenia most likely secondary to alcohol abuse and ?liver disease - improving, continue to monitor  Chronic Frontotemporal Encephalomalacia: post ischemic vs post traumatic   DVT prophylaxis: SCD Code Status: full Family Communication: none Disposition:   Status is: Observation  The patient will require care spanning > 2 midnights and should be moved to inpatient because: IV treatments appropriate due to intensity of illness or inability to take PO  Dispo: The patient is from: Home              Anticipated d/c is to: Home              Anticipated d/c date is: > 3 days              Patient currently is not medically stable to d/c.       Consultants:   none  Procedures:  Echo IMPRESSIONS    1. Left ventricular ejection fraction, by estimation, is 60 to 65%. The  left ventricle has normal function. The left ventricle has no regional  wall motion abnormalities. Left ventricular diastolic parameters are  consistent with Grade I diastolic  dysfunction (impaired relaxation).  2. Right ventricular systolic function is normal. The right ventricular  size is normal. There is normal pulmonary artery systolic pressure.  3. Left atrial size was moderately dilated.  4. The mitral valve is normal in structure. No evidence of mitral valve  regurgitation. No evidence of mitral stenosis.  5. The aortic valve is tricuspid. Aortic valve regurgitation is not  visualized. No aortic stenosis is present.  6. Aortic dilatation noted. There is mild dilatation at the level of the  sinuses of Valsalva measuring 41 mm.  7. The inferior vena cava is normal in size with greater than 50%  respiratory variability, suggesting right atrial pressure of 3 mmHg.  Antimicrobials: Anti-infectives (From admission, onward)   Start      Dose/Rate Route Frequency Ordered Stop   10/18/19 1800  doxycycline (VIBRA-TABS) tablet 100 mg  Status:  Discontinued        100 mg Oral Every 12 hours 10/18/19 1705 10/20/19 1707   10/18/19 1600  fluconazole (DIFLUCAN) tablet 150 mg     Discontinue     150 mg Oral Daily 10/18/19 1443 11/01/19 0959   10/16/19 1500  DAPTOmycin (CUBICIN) 500 mg in sodium chloride 0.9 % IVPB  Status:  Discontinued        500 mg 220 mL/hr over 30 Minutes Intravenous Daily 10/16/19 1332 10/18/19 1508   10/16/19 1300  vancomycin (VANCOREADY) IVPB 750 mg/150 mL  Status:  Discontinued        750 mg 150 mL/hr over 60 Minutes Intravenous Every 12 hours 10/16/19 1227 10/16/19 1328         Subjective: Doesn't feel well today, c/o headache  Objective: Vitals:   10/20/19 0600 10/20/19 0604 10/20/19 0900 10/20/19 1426  BP: 124/72   104/62  Pulse: 99   70  Resp: 18   14  Temp: (!) 100.7 F (38.2 C)  100.3 F (37.9 C) 99.3 F (37.4 C)  TempSrc: Oral   Oral  SpO2: 94%   97%  Weight:  52.9 kg    Height:        Intake/Output Summary (Last 24 hours) at 10/20/2019 1743 Last data filed at 10/20/2019 1500 Gross per  24 hour  Intake 874.58 ml  Output 600 ml  Net 274.58 ml   Filed Weights   10/18/19 0500 10/19/19 0555 10/20/19 0604  Weight: 51.9 kg 53.1 kg 52.9 kg    Examination:  General: No acute distress. Cardiovascular: Heart sounds show Trevor Williams regular rate, and rhythm.  Lungs: Clear to auscultation bilaterally  Abdomen: Soft, nontender, nondistended  Neurological: Alert and oriented 3. Moves all extremities 4. Cranial nerves II through XII grossly intact. Skin: Warm and dry. No rashes or lesions. Extremities: No clubbing or cyanosis. No edema  Data Reviewed: I have personally reviewed following labs and imaging studies  CBC: Recent Labs  Lab 10/16/19 0521 10/16/19 0521 10/17/19 0549 10/17/19 0549 10/17/19 1509 10/18/19 0604 10/19/19 0607 10/20/19 0522 10/20/19 1355  WBC 4.2  --  4.9  --   --   5.8 5.7 4.6  --   NEUTROABS 2.3  --  3.0  --   --  3.6 3.4 2.5  --   HGB 9.9*   < > 8.5*   < > 8.3* 8.0* 8.7* 7.4* 8.3*  HCT 30.2*   < > 26.3*   < > 25.2* 24.3* 26.4* 22.8* 25.5*  MCV 115.7*  --  115.9*  --   --  114.1* 114.8* 114.6*  --   PLT 47*  --  65*  --   --  82* 101* 98*  --    < > = values in this interval not displayed.    Basic Metabolic Panel: Recent Labs  Lab 10/16/19 0521 10/17/19 0549 10/18/19 0604 10/19/19 0607 10/20/19 0522  NA 134* 136 138 136 135  K 3.6 3.2* 3.6 3.7 4.2  CL 107 103 104 104 103  CO2 20* 25 26 25 25   GLUCOSE 84 101* 93 87 82  BUN 16 10 9 7 8   CREATININE 0.69 0.45* 0.55* 0.57* 0.61  CALCIUM 8.3* 8.3* 8.4* 8.3* 8.0*  MG 1.5* 1.7 1.4* 1.5* 1.5*  PHOS 2.2* 2.3* 2.4* 3.1 3.2    GFR: Estimated Creatinine Clearance: 75.3 mL/min (by C-G formula based on SCr of 0.61 mg/dL).  Liver Function Tests: Recent Labs  Lab 10/16/19 0521 10/17/19 0549 10/18/19 0604 10/19/19 0607 10/20/19 0522  AST 80* 98* 90* 80* 83*  ALT 33 39 40 37 35  ALKPHOS 94 92 87 85 84  BILITOT 1.7* 1.1 1.0 1.1 1.0  PROT 5.8* 5.9* 5.8* 5.9* 5.5*  ALBUMIN 2.9* 2.9* 3.0* 2.8* 2.7*    CBG: No results for input(s): GLUCAP in the last 168 hours.   Recent Results (from the past 240 hour(s))  SARS Coronavirus 2 by RT PCR (hospital order, performed in [facility] hospital lab) Nasopharyngeal Nasopharyngeal Swab     Status: None   Collection Time: 10/15/19 12:22 AM   Specimen: Nasopharyngeal Swab  Result Value Ref Range Status   SARS Coronavirus 2 NEGATIVE NEGATIVE Final    Comment: (NOTE) SARS-CoV-2 target nucleic acids are NOT DETECTED.  The SARS-CoV-2 RNA is generally detectable in upper and lower respiratory specimens during the acute phase of infection. The lowest concentration of SARS-CoV-2 viral copies this assay can detect is 250 copies / mL. Trevor Williams negative result does not preclude SARS-CoV-2 infection and should not be used as the sole basis for treatment or  other patient management decisions.  Trevor Williams negative result may occur with improper specimen collection / handling, submission of specimen other than nasopharyngeal swab, presence of viral mutation(s) within the areas targeted by this assay, and inadequate number of  viral copies (<250 copies / mL). Trevor Williams negative result must be combined with clinical observations, patient history, and epidemiological information.  Fact Sheet for Patients:   StrictlyIdeas.no  Fact Sheet for Healthcare Providers: BankingDealers.co.za  This test is not yet approved or  cleared by the [place] FDA and has been authorized for detection and/or diagnosis of SARS-CoV-2 by FDA under an Emergency Use Authorization (EUA).  This EUA will remain in effect (meaning this test can be used) for the duration of the COVID-19 declaration under Section 564(b)(1) of the Act, 21 U.S.C. section 360bbb-3(b)(1), unless the authorization is terminated or revoked sooner.  Performed at [facility], [place] [address] [place], [place] [address]   Blood culture (routine x 2)     Status: Abnormal   Collection Time: 10/15/19 12:45 AM   Specimen: BLOOD  Result Value Ref Range Status   Specimen Description   Final    BLOOD BLOOD RIGHT ARM Performed at [facility] [address], [place], [place] [address]    Special Requests   Final    BOTTLES DRAWN AEROBIC AND ANAEROBIC Blood Culture results may not be optimal due to an inadequate volume of blood received in culture bottles Performed at [facility] [address] [place], [place] [address]    Culture  Setup Time   Final    GRAM POSITIVE COCCI IN CLUSTERS IN BOTH AEROBIC AND ANAEROBIC BOTTLES CRITICAL VALUE NOTED.  VALUE IS CONSISTENT WITH PREVIOUSLY REPORTED AND CALLED VALUE.    Culture (Trevor Williams)  Final    STAPHYLOCOCCUS HOMINIS SUSCEPTIBILITIES PERFORMED ON PREVIOUS CULTURE  WITHIN THE LAST 5 DAYS. Performed at [facility], [place] [address] [place] [address], [place], [place] [address]    Report Status 10/18/2019 FINAL  Final  Blood culture (routine x 2)     Status: Abnormal   Collection Time: 10/15/19 12:45 AM   Specimen: BLOOD  Result Value Ref Range Status   Specimen Description   Final    BLOOD BLOOD LEFT FOREARM Performed at [facility] [address] [place], [place] [address]    Special Requests   Final    BOTTLES DRAWN AEROBIC AND ANAEROBIC Blood Culture adequate volume Performed at [facility] [address] [place], [place] [address]    Culture  Setup Time   Final    GRAM POSITIVE COCCI IN CLUSTERS AEROBIC BOTTLE ONLY Organism ID to follow CRITICAL RESULT CALLED TO, READ BACK BY AND VERIFIED WITH: Elenore Paddy PHARMD [id number] 10/15/19 Trevor Williams BROWNING Performed at [facility], [place] [address], [place],  [address]    Culture STAPHYLOCOCCUS HOMINIS ([place])  Final   Report Status 10/17/2019 FINAL  Final   Organism ID, Bacteria STAPHYLOCOCCUS HOMINIS  Final      Susceptibility   Staphylococcus hominis - MIC*    CIPROFLOXACIN <=0.5 SENSITIVE Sensitive     ERYTHROMYCIN INTERMEDIATE Intermediate     GENTAMICIN <=0.5 SENSITIVE Sensitive     OXACILLIN <=0.25 SENSITIVE Sensitive     TETRACYCLINE <=1 SENSITIVE Sensitive     VANCOMYCIN <=0.5 SENSITIVE Sensitive     TRIMETH/SULFA <=10 SENSITIVE Sensitive     CLINDAMYCIN >=8 RESISTANT Resistant     RIFAMPIN <=0.5 SENSITIVE Sensitive     Inducible Clindamycin NEGATIVE Sensitive     * STAPHYLOCOCCUS HOMINIS  Blood Culture ID Panel (Reflexed)     Status: Abnormal   Collection Time: 10/15/19 12:45 AM  Result Value Ref Range Status  Enterococcus species NOT DETECTED NOT DETECTED Final   Listeria monocytogenes NOT DETECTED NOT DETECTED Final   Staphylococcus species DETECTED (Trevor Williams) NOT DETECTED Final    Comment: Methicillin (oxacillin) resistant coagulase negative  staphylococcus. Possible blood culture contaminant (unless isolated from more than one blood culture draw or clinical case suggests pathogenicity). No antibiotic treatment is indicated for blood  culture contaminants. CRITICAL RESULT CALLED TO, READ BACK BY AND VERIFIED WITH: Trevor POINDEXTER PHARMD 2328 10/15/19 Trevor Williams BROWNING    Staphylococcus aureus (BCID) NOT DETECTED NOT DETECTED Final   Methicillin resistance DETECTED (Trevor Williams) NOT DETECTED Final    Comment: CRITICAL RESULT CALLED TO, READ BACK BY AND VERIFIED WITH: Trevor POINDEXTER PHARMD 2328 10/15/19 Trevor Williams BROWNING    Streptococcus species NOT DETECTED NOT DETECTED Final   Streptococcus agalactiae NOT DETECTED NOT DETECTED Final   Streptococcus pneumoniae NOT DETECTED NOT DETECTED Final   Streptococcus pyogenes NOT DETECTED NOT DETECTED Final   Acinetobacter baumannii NOT DETECTED NOT DETECTED Final   Enterobacteriaceae species NOT DETECTED NOT DETECTED Final   Enterobacter cloacae complex NOT DETECTED NOT DETECTED Final   Escherichia coli NOT DETECTED NOT DETECTED Final   Klebsiella oxytoca NOT DETECTED NOT DETECTED Final   Klebsiella pneumoniae NOT DETECTED NOT DETECTED Final   Proteus species NOT DETECTED NOT DETECTED Final   Serratia marcescens NOT DETECTED NOT DETECTED Final   Haemophilus influenzae NOT DETECTED NOT DETECTED Final   Neisseria meningitidis NOT DETECTED NOT DETECTED Final   Pseudomonas aeruginosa NOT DETECTED NOT DETECTED Final   Candida albicans NOT DETECTED NOT DETECTED Final   Candida glabrata NOT DETECTED NOT DETECTED Final   Candida krusei NOT DETECTED NOT DETECTED Final   Candida parapsilosis NOT DETECTED NOT DETECTED Final   Candida tropicalis NOT DETECTED NOT DETECTED Final    Comment: Performed at [facility], [place]. [address] [place], [place] [address]  Culture, Urine     Status: Abnormal   Collection Time: 10/15/19  2:15 AM   Specimen: Urine, Clean Catch  Result Value Ref Range Status   Specimen Description    Final    URINE, CLEAN CATCH Performed at [facility], [place] [address] [place], [place] [address]    Special Requests   Final    NONE Performed at [facility], [place] [address], [place], [place] [address]    Culture MULTIPLE SPECIES PRESENT, SUGGEST RECOLLECTION ([place])  Final   Report Status 10/16/2019 FINAL  Final  Culture, blood (routine x 2)     Status: None (Preliminary result)   Collection Time: 10/16/19  8:55 AM   Specimen: BLOOD LEFT HAND  Result Value Ref Range Status   Specimen Description   Final    BLOOD LEFT HAND Performed at [facility] [address] [place], [place] [address]    Special Requests   Final    BOTTLES DRAWN AEROBIC ONLY Blood Culture adequate volume Performed at [facility] [address] [place], [place] [address]    Culture   Final    NO GROWTH 4 DAYS Performed at [facility], [place] [address], [place], [place] [address]    Report Status PENDING  Incomplete  Culture, blood (routine x 2)     Status: None (Preliminary result)   Collection Time: 10/16/19  9:28 AM   Specimen: BLOOD  Result Value Ref Range Status   Specimen Description   Final    BLOOD RIGHT ARM Performed at [facility],  [place] [address] [place], [place] [address]    Special Requests   Final    BOTTLES DRAWN AEROBIC ONLY Blood Culture adequate volume Performed at [facility] [address] [place], [place] [address]    Culture   Final    NO GROWTH 4 DAYS Performed at [facility], [place] [address], [place], [place] [address]    Report Status PENDING  Incomplete         Radiology Studies: DG CHEST PORT 1 VIEW  Result Date: 10/20/2019 CLINICAL DATA:  Fever EXAM: PORTABLE CHEST 1 VIEW COMPARISON:  October 15, 2019 FINDINGS: There is atelectatic change in the lung bases. There are bullae in the medial right upper lobe. Lungs elsewhere are clear. Heart  size and pulmonary vascularity are normal. No adenopathy. There is aortic atherosclerosis. No bone lesions. IMPRESSION: Mild bibasilar atelectasis. Bullous disease right upper lobe medially. No edema or airspace opacity. Heart size normal. Aortic Atherosclerosis (ICD10-I70.0). Electronically Signed   By: Lowella Grip III M.D.   On: 10/20/2019 08:58        Scheduled Meds:  docusate sodium  100 mg Oral BID   feeding supplement  1 Container Oral TID BM   feeding supplement (KATE FARMS STANDARD 1.4)  325 mL Oral Daily   ferrous sulfate  325 mg Oral Q breakfast   fluconazole  150 mg Oral Daily   folic acid  1 mg Oral Daily   multivitamin with minerals  1 tablet Oral Daily   nicotine  21 mg Transdermal Daily   sodium chloride flush  3 mL Intravenous Q12H   traZODone  100 mg Oral QHS   Continuous Infusions:  sodium chloride 50 mL/hr at 10/20/19 1258     LOS: 5 days    Time spent: over 30 min    Fayrene Helper, MD [facility]   To contact the attending provider between 7A-7P or the covering provider during after hours 7P-7A, please log into the web site [email or web address] and access using universal [place] password for that web site. If you do not have the password, please call the hospital operator.  10/20/2019, 5:43 PM
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
## 2019-10-20 NOTE — Progress Notes (Signed)
Patient ID: Trevor Williams, male   DOB: 1960-12-18, 59 y.o.   MRN: [id number]         [facility] for Infectious Disease  Date of Admission:  10/14/2019   Total days of antibiotics 5        Day 3 doxycycline         ASSESSMENT: Both sets of admission blood cultures grew staph hominis but they had different antibiotic susceptibility results indicating that they were contaminants.  His daptomycin has been stopped.  He was started on doxycycline because of the history of tick exposure and rash.  I think the new rash on his back he has heat rash.  His thrombocytopenia is chronic and stable.  He has not had any fever.  I recommend stopping doxycycline now.  PLAN: 1. Discontinue doxycycline 2. I will sign off now  Principal Problem:   Positive blood culture Active Problems:   Alcohol use   Homeless   Hypokalemia   Tobacco use disorder   Chronic systolic heart failure (HCC)   Elevated LFTs   Hypomagnesemia   Dehydration   Dizziness   COPD (chronic obstructive pulmonary disease) (HCC)   Macrocytic anemia   Scheduled Meds: . docusate sodium  100 mg Oral BID  . doxycycline  100 mg Oral Q12H  . feeding supplement  1 Container Oral TID BM  . feeding supplement (KATE FARMS STANDARD 1.4)  325 mL Oral Daily  . ferrous sulfate  325 mg Oral Q breakfast  . fluconazole  150 mg Oral Daily  . folic acid  1 mg Oral Daily  . multivitamin with minerals  1 tablet Oral Daily  . nicotine  21 mg Transdermal Daily  . sodium chloride flush  3 mL Intravenous Q12H  . traZODone  100 mg Oral QHS   Continuous Infusions: . sodium chloride 50 mL/hr at 10/20/19 1258   PRN Meds:.alum & mag hydroxide-simeth, loperamide, ondansetron **OR** ondansetron (ZOFRAN) IV, oxyCODONE   SUBJECTIVE: He is feeling better.  He has had a rash on his lower abdomen, groin and upper leg for about 4 years.  He has never taken anything for it.  It itches.  He has had some new rash back that developed after admission.  He  has a history of tick exposure.  Review of Systems: Review of Systems  Constitutional: Negative for chills, diaphoresis and fever.  Skin: Positive for itching and rash.    Allergies  Allergen Reactions  . Spironolactone     gynecomastia  . Doxycycline Rash    Rash noted after administration of vancomycin, doxycycline, and ceftriaxone. Unclear cause of rash.  . Ibuprofen Rash  . Rocephin [Ceftriaxone] Rash    Rash noted after administration of vancomycin, doxycycline, and ceftriaxone. Unclear cause of rash.  . Tylenol [Acetaminophen] Rash  . Vancomycin Rash    Rash noted after administration of vancomycin, doxycycline, and ceftriaxone. Unclear cause of rash.    OBJECTIVE: Vitals:   10/20/19 0600 10/20/19 0604 10/20/19 0900 10/20/19 1426  BP: 124/72   104/62  Pulse: 99   70  Resp: 18   14  Temp: (!) 100.7 F (38.2 C)  100.3 F (37.9 C) 99.3 F (37.4 C)  TempSrc: Oral   Oral  SpO2: 94%   97%  Weight:  52.9 kg    Height:       Body mass index is 18.82 kg/m.  Physical Exam Constitutional:      Comments: He is resting quietly in bed.  Cardiovascular:  Rate and Rhythm: Normal rate and regular rhythm.     Heart sounds: No murmur heard.   Pulmonary:     Effort: Pulmonary effort is normal.     Breath sounds: Normal breath sounds.  Skin:    Comments: He has a maculopapular rash on his lower abdomen.  He has confluent, flaky skin from the mid abdomen down to his thighs anteriorly.  Psychiatric:        Mood and Affect: Mood normal.     Lab Results Lab Results  Component Value Date   WBC 4.6 10/20/2019   HGB 8.3 (L) 10/20/2019   HCT 25.5 (L) 10/20/2019   MCV 114.6 (H) 10/20/2019   PLT 98 (L) 10/20/2019    Lab Results  Component Value Date   CREATININE 0.61 10/20/2019   BUN 8 10/20/2019   NA 135 10/20/2019   K 4.2 10/20/2019   CL 103 10/20/2019   CO2 25 10/20/2019    Lab Results  Component Value Date   ALT 35 10/20/2019   AST 83 (H) 10/20/2019    ALKPHOS 84 10/20/2019   BILITOT 1.0 10/20/2019     Microbiology: Recent Results (from the past 240 hour(s))  SARS Coronavirus 2 by RT PCR (hospital order, performed in [place] hospital lab) Nasopharyngeal Nasopharyngeal Swab     Status: None   Collection Time: 10/15/19 12:22 AM   Specimen: Nasopharyngeal Swab  Result Value Ref Range Status   SARS Coronavirus 2 NEGATIVE NEGATIVE Final    Comment: (NOTE) SARS-CoV-2 target nucleic acids are NOT DETECTED.  The SARS-CoV-2 RNA is generally detectable in upper and lower respiratory specimens during the acute phase of infection. The lowest concentration of SARS-CoV-2 viral copies this assay can detect is 250 copies / mL. A negative result does not preclude SARS-CoV-2 infection and should not be used as the sole basis for treatment or other patient management decisions.  A negative result may occur with improper specimen collection / handling, submission of specimen other than nasopharyngeal swab, presence of viral mutation(s) within the areas targeted by this assay, and inadequate number of viral copies (<250 copies / mL). A negative result must be combined with clinical observations, patient history, and epidemiological information.  Fact Sheet for Patients:   StrictlyIdeas.no  Fact Sheet for Healthcare Providers: BankingDealers.co.za  This test is not yet approved or  cleared by the [place] FDA and has been authorized for detection and/or diagnosis of SARS-CoV-2 by FDA under an Emergency Use Authorization (EUA).  This EUA will remain in effect (meaning this test can be used) for the duration of the COVID-19 declaration under Section 564(b)(1) of the Act, 21 U.S.C. section 360bbb-3(b)(1), unless the authorization is terminated or revoked sooner.  Performed at [facility], [place] [address] [place], [place] [address]   Blood culture (routine x 2)     Status:  Abnormal   Collection Time: 10/15/19 12:45 AM   Specimen: BLOOD  Result Value Ref Range Status   Specimen Description   Final    BLOOD BLOOD RIGHT ARM Performed at [facility] [address] [place], [place] [address]    Special Requests   Final    BOTTLES DRAWN AEROBIC AND ANAEROBIC Blood Culture results may not be optimal due to an inadequate volume of blood received in culture bottles Performed at [facility] [address], [place], [place] [address]    Culture  Setup Time   Final    GRAM POSITIVE  COCCI IN CLUSTERS IN BOTH AEROBIC AND ANAEROBIC BOTTLES CRITICAL VALUE NOTED.  VALUE IS CONSISTENT WITH PREVIOUSLY REPORTED AND CALLED VALUE.    Culture (A)  Final    STAPHYLOCOCCUS HOMINIS SUSCEPTIBILITIES PERFORMED ON PREVIOUS CULTURE WITHIN THE LAST 5 DAYS. Performed at [facility], [place] [address], [place], [place] [address]    Report Status 10/18/2019 FINAL  Final  Blood culture (routine x 2)     Status: Abnormal   Collection Time: 10/15/19 12:45 AM   Specimen: BLOOD  Result Value Ref Range Status   Specimen Description   Final    BLOOD BLOOD LEFT FOREARM Performed at [facility] [address], [place], [place] [address]    Special Requests   Final    BOTTLES DRAWN AEROBIC AND ANAEROBIC Blood Culture adequate volume Performed at [facility] [address], [place], [place] [address]    Culture  Setup Time   Final    GRAM POSITIVE COCCI IN CLUSTERS AEROBIC BOTTLE ONLY Organism ID to follow CRITICAL RESULT CALLED TO, READ BACK BY AND VERIFIED WITH: Elenore Paddy PHARMD [id number] 10/15/19 A BROWNING Performed at [facility], [place] [address], [place], [place] [address]    Culture STAPHYLOCOCCUS HOMINIS (A)  Final   Report Status 10/17/2019 FINAL  Final   Organism ID, Bacteria STAPHYLOCOCCUS HOMINIS  Final      Susceptibility   Staphylococcus hominis - MIC*    CIPROFLOXACIN <=0.5  SENSITIVE Sensitive     ERYTHROMYCIN INTERMEDIATE Intermediate     GENTAMICIN <=0.5 SENSITIVE Sensitive     OXACILLIN <=0.25 SENSITIVE Sensitive     TETRACYCLINE <=1 SENSITIVE Sensitive     VANCOMYCIN <=0.5 SENSITIVE Sensitive     TRIMETH/SULFA <=10 SENSITIVE Sensitive     CLINDAMYCIN >=8 RESISTANT Resistant     RIFAMPIN <=0.5 SENSITIVE Sensitive     Inducible Clindamycin NEGATIVE Sensitive     * STAPHYLOCOCCUS HOMINIS  Blood Culture ID Panel (Reflexed)     Status: Abnormal   Collection Time: 10/15/19 12:45 AM  Result Value Ref Range Status   Enterococcus species NOT DETECTED NOT DETECTED Final   Listeria monocytogenes NOT DETECTED NOT DETECTED Final   Staphylococcus species DETECTED (A) NOT DETECTED Final    Comment: Methicillin (oxacillin) resistant coagulase negative staphylococcus. Possible blood culture contaminant (unless isolated from more than one blood culture draw or clinical case suggests pathogenicity). No antibiotic treatment is indicated for blood  culture contaminants. CRITICAL RESULT CALLED TO, READ BACK BY AND VERIFIED WITH: L POINDEXTER PHARMD [id number] 10/15/19 A BROWNING    Staphylococcus aureus (BCID) NOT DETECTED NOT DETECTED Final   Methicillin resistance DETECTED (A) NOT DETECTED Final    Comment: CRITICAL RESULT CALLED TO, READ BACK BY AND VERIFIED WITH: L POINDEXTER PHARMD [id number] 10/15/19 A BROWNING    Streptococcus species NOT DETECTED NOT DETECTED Final   Streptococcus agalactiae NOT DETECTED NOT DETECTED Final   Streptococcus pneumoniae NOT DETECTED NOT DETECTED Final   Streptococcus pyogenes NOT DETECTED NOT DETECTED Final   Acinetobacter baumannii NOT DETECTED NOT DETECTED Final   Enterobacteriaceae species NOT DETECTED NOT DETECTED Final   Enterobacter cloacae complex NOT DETECTED NOT DETECTED Final   Escherichia coli NOT DETECTED NOT DETECTED Final   Klebsiella oxytoca NOT DETECTED NOT DETECTED Final   Klebsiella pneumoniae NOT DETECTED NOT DETECTED Final     Proteus species NOT DETECTED NOT DETECTED Final   Serratia marcescens NOT DETECTED NOT DETECTED Final   Haemophilus influenzae NOT DETECTED  NOT DETECTED Final   Neisseria meningitidis NOT DETECTED NOT DETECTED Final   Pseudomonas aeruginosa NOT DETECTED NOT DETECTED Final   Candida albicans NOT DETECTED NOT DETECTED Final   Candida glabrata NOT DETECTED NOT DETECTED Final   Candida krusei NOT DETECTED NOT DETECTED Final   Candida parapsilosis NOT DETECTED NOT DETECTED Final   Candida tropicalis NOT DETECTED NOT DETECTED Final    Comment: Performed at [facility], [place] [address], [place], [place] [address]  Culture, Urine     Status: Abnormal   Collection Time: 10/15/19  2:15 AM   Specimen: Urine, Clean Catch  Result Value Ref Range Status   Specimen Description   Final    URINE, CLEAN CATCH Performed at [facility], [place] [address], [place], [place] [address]    Special Requests   Final    NONE Performed at [facility], [place] [address], [place], [place] [address]    Culture MULTIPLE SPECIES PRESENT, SUGGEST RECOLLECTION (A)  Final   Report Status 10/16/2019 FINAL  Final  Culture, blood (routine x 2)     Status: None (Preliminary result)   Collection Time: 10/16/19  8:55 AM   Specimen: BLOOD LEFT HAND  Result Value Ref Range Status   Specimen Description   Final    BLOOD LEFT HAND Performed at [facility] [address] [place], [place] [address]    Special Requests   Final    BOTTLES DRAWN AEROBIC ONLY Blood Culture adequate volume Performed at [facility] [address], [place], [place] [address]    Culture   Final    NO GROWTH 4 DAYS Performed at [facility], [place] [address], [place], [place] [address]    Report Status PENDING  Incomplete  Culture, blood (routine x 2)     Status: None (Preliminary result)   Collection Time: 10/16/19  9:28 AM   Specimen: BLOOD  Result Value  Ref Range Status   Specimen Description   Final    BLOOD RIGHT ARM Performed at [facility] [address], [place], [place] [address]    Special Requests   Final    BOTTLES DRAWN AEROBIC ONLY Blood Culture adequate volume Performed at [facility] [address], [place], [place] [address]    Culture   Final    NO GROWTH 4 DAYS Performed at [facility], [place] [address] [place], [place] [address]    Report Status PENDING  Incomplete    Michel Bickers, MD [facility] [phone] pager   [phone] cell 10/20/2019, 5:03 PM

## 2019-10-21 ENCOUNTER — Telehealth (HOSPITAL_COMMUNITY): Payer: Self-pay

## 2019-10-21 LAB — CULTURE, BLOOD (ROUTINE X 2)
Culture: NO GROWTH
Culture: NO GROWTH
Special Requests: ADEQUATE
Special Requests: ADEQUATE

## 2019-10-21 LAB — CBC WITH DIFFERENTIAL/PLATELET
Abs Immature Granulocytes: 0 10*3/uL (ref 0.00–0.07)
Basophils Absolute: 0.1 10*3/uL (ref 0.0–0.1)
Basophils Relative: 2 %
Eosinophils Absolute: 0.1 10*3/uL (ref 0.0–0.5)
Eosinophils Relative: 3 %
HCT: 24.6 % — ABNORMAL LOW (ref 39.0–52.0)
Hemoglobin: 8.1 g/dL — ABNORMAL LOW (ref 13.0–17.0)
Lymphocytes Relative: 26 %
Lymphs Abs: 1.3 10*3/uL (ref 0.7–4.0)
MCH: 37 pg — ABNORMAL HIGH (ref 26.0–34.0)
MCHC: 32.9 g/dL (ref 30.0–36.0)
MCV: 112.3 fL — ABNORMAL HIGH (ref 80.0–100.0)
Monocytes Absolute: 0.1 10*3/uL (ref 0.1–1.0)
Monocytes Relative: 3 %
Neutro Abs: 3.2 10*3/uL (ref 1.7–7.7)
Neutrophils Relative %: 65 %
Platelets: 111 10*3/uL — ABNORMAL LOW (ref 150–400)
Promyelocytes Relative: 1 %
RBC: 2.19 MIL/uL — ABNORMAL LOW (ref 4.22–5.81)
RDW: 12.5 % (ref 11.5–15.5)
WBC: 4.9 10*3/uL (ref 4.0–10.5)
nRBC: 0 % (ref 0.0–0.2)

## 2019-10-21 LAB — COMPREHENSIVE METABOLIC PANEL
ALT: 37 U/L (ref 0–44)
AST: 79 U/L — ABNORMAL HIGH (ref 15–41)
Albumin: 2.5 g/dL — ABNORMAL LOW (ref 3.5–5.0)
Alkaline Phosphatase: 79 U/L (ref 38–126)
Anion gap: 6 (ref 5–15)
BUN: 8 mg/dL (ref 6–20)
CO2: 25 mmol/L (ref 22–32)
Calcium: 8.1 mg/dL — ABNORMAL LOW (ref 8.9–10.3)
Chloride: 101 mmol/L (ref 98–111)
Creatinine, Ser: 0.63 mg/dL (ref 0.61–1.24)
GFR calc Af Amer: >60 mL/min (ref >60)
GFR calc non Af Amer: >60 mL/min (ref >60)
Glucose, Bld: 83 mg/dL (ref 70–99)
Potassium: 4.3 mmol/L (ref 3.5–5.1)
Sodium: 132 mmol/L — ABNORMAL LOW (ref 135–145)
Total Bilirubin: 1 mg/dL (ref 0.3–1.2)
Total Protein: 5.3 g/dL — ABNORMAL LOW (ref 6.5–8.1)

## 2019-10-21 LAB — PHOSPHORUS: Phosphorus: 3.5 mg/dL (ref 2.5–4.6)

## 2019-10-21 LAB — EHRLICHIA ANTIBODY PANEL
E chaffeensis (HGE) Ab, IgG: NEGATIVE
E chaffeensis (HGE) Ab, IgM: NEGATIVE
E. Chaffeensis (HME) IgM Titer: NEGATIVE
E.Chaffeensis (HME) IgG: NEGATIVE

## 2019-10-21 LAB — PREPARE RBC (CROSSMATCH)

## 2019-10-21 LAB — MAGNESIUM: Magnesium: 1.4 mg/dL — ABNORMAL LOW (ref 1.7–2.4)

## 2019-10-21 MED ORDER — MAGNESIUM SULFATE 4 GM/100ML IV SOLN
4.0000 g | Freq: Once | INTRAVENOUS | Status: AC
  Administered 2019-10-21: 4 g via INTRAVENOUS
  Filled 2019-10-21: qty 100

## 2019-10-21 MED ORDER — FLUCONAZOLE 150 MG PO TABS: 150.0000 mg | ORAL_TABLET | Freq: Once | ORAL | Status: DC

## 2019-10-21 MED ORDER — THIAMINE HCL 100 MG/ML IJ SOLN
100.0000 mg | Freq: Every day | INTRAMUSCULAR | Status: DC
  Administered 2019-10-21 – 2019-10-23 (×3): 100 mg via INTRAVENOUS
  Filled 2019-10-21 (×3): qty 2

## 2019-10-21 MED ORDER — LACTATED RINGERS IV BOLUS
1000.0000 mL | Freq: Once | INTRAVENOUS | Status: AC
  Administered 2019-10-21: 1000 mL via INTRAVENOUS

## 2019-10-21 MED ORDER — SODIUM CHLORIDE 0.9% IV SOLUTION: Freq: Once | INTRAVENOUS | Status: DC

## 2019-10-21 NOTE — Progress Notes (Addendum)
PROGRESS NOTE    Trevor Williams  MLJ:[id number] DOB: Sep 30, 1960 DOA: 10/14/2019 PCP: Elsie Stain, MD   Chief Complaint  Patient presents with  . Diarrhea   Brief Narrative: Trevor Williams is Trevor Williams 59 y.o. 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  Initially concern for coag negative staph bacteremia, but noted to be contaminant.  Doxycycline was started for tick exposure and concern for tick borne illness, though now d/c'd by ID.  Last fever was 8/2 AM.  Was hoping to discharge soon, but he's had continued lightheadedness and dizziness.  Will transfuse 1 unit pRBC.  Given drop in Hb from 11 to 8, follow hemoccult and consider discussion with GI tomorrow, 8/4.  Assessment & Plan:   Principal Problem:   Positive blood culture Active Problems:   Alcohol use   Homeless   Hypokalemia   Tobacco use disorder   Chronic systolic heart failure (HCC)   Elevated LFTs   Hypomagnesemia   Dehydration   Dizziness   COPD (chronic obstructive pulmonary disease) (HCC)   Macrocytic anemia  59 y.o. male with medical history significant of  EtOH abuse, History of cardiomyopathy. 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  Dizziness  Lightheadedness  Volume Depletion:  Orthostatics early today, with increase in HR of ~ 40 sec - follow with 1 L LR  He continues to have Evans and difficulty with ambulation today, unable to ambulate to door with nursing earlier today with [facility] and described as "shaky" on feet with walker with nursing later in day. MRI  without acute intracranial abnormality Physical therapy Continued malaise, improving, follow on therapy - will give 1 unit pRBC to see if this helps with his symptoms Consider GI evaluation as noted below  Iron Deficiency Anemia: downtrending, no obvious signs of bleeding.  Iron supplementation Consider discussion with GI for evaluation prior to discharge with gradual decline in Hb which appears to be much different than the past several months. Hemoccult  Coagulase Negative Staph Bacteremia: in 3/4 bottles, concerning for bacteremia despite coag negative.   Will hold antibiotics given stability and repeat blood cultures  Pending susceptibility - discussed with micro, 1 blood culture from 7/28 not showing sensitivities - 1 blood culture showing resistance to clindamycin and intermediate sensitivity to erythromycin.  The other, per my discussion with micro, is showing resistance to erythromycin and sensitivity to clindamycin.  Requested micro fax this report (currently in chart - reviewed).  With different susceptibility patterns, will stop daptomycin. Repeat blood cx no growth to date.  Recurrent fever 8/2 - none since then, continue to monitor Dapto discontinued Appreciate ID consult   Concern for Tick Borne Illness: per RN discussion with pt and pt discussion, pt pulled off 4 ticks this summer, was started on ID recommended d/c doxycycline, will follow off doxy  Rash  Tinea Corporis: confluent erythematous plaque on back and abdomen with overlying scale, more defined/raised border.  Suspect this maybe fungal. S/p fluconazole (initially ordered as daily, but only needs once weekly x2 weeks, 1 more dose on 10/25/19)  Diarrhea: follow fecal lactoferrin, consider additional w/u if continued diarrhea  Chronic systolic heart failure ([place]) -monitor for any  sign of fluid overload Echo from 04/2019 notable for EF 30% Echo 7/28 with EF 60-65%, impaired diastolic function (see report) I/O, daily  weights  Pyuria: in setting of chills, follow urine culture and treat as indicated.  He's afebrile without leukocytosis.  Follow urine culture (multiple species).  Blood cultures as above.  AKI  Proteinuria Baseline creatinine <1 At presentation 1.57, improving with IVF Urine protein/creatinine ratio, 0.15 Follow renal US with trace pararenal fluid on L  Alcohol abuse.  Will order CIWA protocol monitor for any signs of withdrawal.  thiamine.  Elevated LFTs most likely secondary to alcohol abuse [place] notable for cholelithiasis without cholecystitis and steatosis Negative HIV and acute hepatitis panel  Hypokalemia - improved - replcae and follow  Hypomagnesemia - replace and follow  Tobacco use disorder -  encourage cessation  Thrombocytopenia most likely secondary to alcohol abuse and ?liver disease - improving, continue to monitor  Chronic Frontotemporal Encephalomalacia: post ischemic vs post traumatic   DVT prophylaxis: SCD Code Status: full Family Communication: none Disposition:   Status is: Observation  The patient will require care spanning > 2 midnights and should be moved to inpatient because: IV treatments appropriate due to intensity of illness or inability to take PO  Dispo: The patient is from: Home              Anticipated d/c is to: Home              Anticipated d/c date is: > 3 days              Patient currently is not medically stable to d/c.       Consultants:   none  Procedures:  Echo IMPRESSIONS    1. Left ventricular ejection fraction, by estimation, is 60 to 65%. The  left ventricle has normal function. The left ventricle has no regional  wall motion abnormalities. Left ventricular diastolic parameters are  consistent with Grade I diastolic  dysfunction (impaired relaxation).  2. Right ventricular systolic function is normal. The right ventricular  size is normal. There is normal pulmonary artery systolic pressure.  3. Left  atrial size was moderately dilated.  4. The mitral valve is normal in structure. No evidence of mitral valve  regurgitation. No evidence of mitral stenosis.  5. The aortic valve is tricuspid. Aortic valve regurgitation is not  visualized. No aortic stenosis is present.  6. Aortic dilatation noted. There is mild dilatation at the level of the  sinuses of Valsalva measuring 41 mm.  7. The inferior vena cava is normal in size with greater than 50%  respiratory variability, suggesting right atrial pressure of 3 mmHg.  Antimicrobials: Anti-infectives (From admission, onward)   Start     Dose/Rate Route Frequency Ordered Stop   10/18/19 1800  doxycycline (VIBRA-TABS) tablet 100 mg  Status:  Discontinued        100 mg Oral Every 12 hours 10/18/19 1705 10/20/19 1707   10/18/19 1600  fluconazole (DIFLUCAN) tablet 150 mg  Status:  Discontinued        150 mg Oral Daily 10/18/19 1443 10/21/19 1300   10/16/19 1500  DAPTOmycin (CUBICIN) 500 mg in sodium chloride 0.9 % IVPB  Status:  Discontinued        500 mg 220 mL/hr over 30 Minutes Intravenous Daily 10/16/19 1332 10/18/19 1508   10/16/19 1300  vancomycin (VANCOREADY) IVPB 750 mg/150 mL  Status:  Discontinued        750 mg 150 mL/hr over  60 Minutes Intravenous Every 12 hours 10/16/19 1227 10/16/19 1328      Subjective: Continues to complain of lightheadedness, malaise  Objective: Vitals:   10/21/19 1319 10/21/19 1720 10/21/19 1723 10/21/19 1726  BP:  123/86 115/74 102/65  Pulse:  79 89 72  Resp:      Temp:      TempSrc:      SpO2: 100%  100% 100%  Weight:      Height:        Intake/Output Summary (Last 24 hours) at 10/21/2019 1846 Last data filed at 10/21/2019 1600 Gross per 24 hour  Intake 2983.39 ml  Output 950 ml  Net 2033.39 ml   Filed Weights   10/18/19 0500 10/19/19 0555 10/20/19 0604  Weight: 51.9 kg 53.1 kg 52.9 kg    Examination:  General: No acute distress. Cardiovascular: Heart sounds show Zafiro Routson regular rate, and  rhythm. Lungs: Clear to auscultation bilaterally  Abdomen: Soft, nontender, nondistended  Neurological: Alert and oriented 3. Moves all extremities 4. Cranial nerves II through XII grossly intact. Skin: Warm and dry. No rashes or lesions. Extremities: No clubbing or cyanosis. No edema.   Data Reviewed: I have personally reviewed following labs and imaging studies  CBC: Recent Labs  Lab 10/17/19 0549 10/17/19 1509 10/18/19 0604 10/19/19 0607 10/20/19 0522 10/20/19 1355 10/21/19 0550  WBC 4.9  --  5.8 5.7 4.6  --  4.9  NEUTROABS 3.0  --  3.6 3.4 2.5  --  3.2  HGB 8.5*   < > 8.0* 8.7* 7.4* 8.3* 8.1*  HCT 26.3*   < > 24.3* 26.4* 22.8* 25.5* 24.6*  MCV 115.9*  --  114.1* 114.8* 114.6*  --  112.3*  PLT 65*  --  82* 101* 98*  --  111*   < > = values in this interval not displayed.    Basic Metabolic Panel: Recent Labs  Lab 10/17/19 0549 10/18/19 0604 10/19/19 0607 10/20/19 0522 10/21/19 0550  NA 136 138 136 135 132*  K 3.2* 3.6 3.7 4.2 4.3  CL 103 104 104 103 101  CO2 25 26 25 25 25   GLUCOSE 101* 93 87 82 83  BUN 10 9 7 8 8   CREATININE 0.45* 0.55* 0.57* 0.61 0.63  CALCIUM 8.3* 8.4* 8.3* 8.0* 8.1*  MG 1.7 1.4* 1.5* 1.5* 1.4*  PHOS 2.3* 2.4* 3.1 3.2 3.5    GFR: Estimated Creatinine Clearance: 75.3 mL/min (by C-G formula based on SCr of 0.63 mg/dL).  Liver Function Tests: Recent Labs  Lab 10/17/19 0549 10/18/19 0604 10/19/19 0607 10/20/19 0522 10/21/19 0550  AST 98* 90* 80* 83* 79*  ALT 39 40 37 35 37  ALKPHOS 92 87 85 84 79  BILITOT 1.1 1.0 1.1 1.0 1.0  PROT 5.9* 5.8* 5.9* 5.5* 5.3*  ALBUMIN 2.9* 3.0* 2.8* 2.7* 2.5*    CBG: No results for input(s): GLUCAP in the last 168 hours.   Recent Results (from the past 240 hour(s))  SARS Coronavirus 2 by RT PCR (hospital order, performed in [facility] hospital lab) Nasopharyngeal Nasopharyngeal Swab     Status: None   Collection Time: 10/15/19 12:22 AM   Specimen: Nasopharyngeal Swab  Result Value Ref Range  Status   SARS Coronavirus 2 NEGATIVE NEGATIVE Final    Comment: (NOTE) SARS-CoV-2 target nucleic acids are NOT DETECTED.  The SARS-CoV-2 RNA is generally detectable in upper and lower respiratory specimens during the acute phase of infection. The lowest concentration of SARS-CoV-2 viral copies this assay can  detect is 250 copies / mL. Shannell Mikkelsen negative result does not preclude SARS-CoV-2 infection and should not be used as the sole basis for treatment or other patient management decisions.  Paw Karstens negative result may occur with improper specimen collection / handling, submission of specimen other than nasopharyngeal swab, presence of viral mutation(s) within the areas targeted by this assay, and inadequate number of viral copies (<250 copies / mL). Tasean Mancha negative result must be combined with clinical observations, patient history, and epidemiological information.  Fact Sheet for Patients:   StrictlyIdeas.no  Fact Sheet for Healthcare Providers: BankingDealers.co.za  This test is not yet approved or  cleared by the [place] FDA and has been authorized for detection and/or diagnosis of SARS-CoV-2 by FDA under an Emergency Use Authorization (EUA).  This EUA will remain in effect (meaning this test can be used) for the duration of the COVID-19 declaration under Section 564(b)(1) of the Act, 21 U.S.C. section 360bbb-3(b)(1), unless the authorization is terminated or revoked sooner.  Performed at [facility], [place] [address], [place], [place] [address]   Blood culture (routine x 2)     Status: Abnormal   Collection Time: 10/15/19 12:45 AM   Specimen: BLOOD  Result Value Ref Range Status   Specimen Description   Final    BLOOD BLOOD RIGHT ARM Performed at [place] [address] [place], [place] [address]    Special Requests   Final    BOTTLES DRAWN AEROBIC AND ANAEROBIC Blood Culture results may not be  optimal due to an inadequate volume of blood received in culture bottles Performed at [place] [address], [place], [place] [address]    Culture  Setup Time   Final    GRAM POSITIVE COCCI IN CLUSTERS IN BOTH AEROBIC AND ANAEROBIC BOTTLES CRITICAL VALUE NOTED.  VALUE IS CONSISTENT WITH PREVIOUSLY REPORTED AND CALLED VALUE.    Culture (Yumiko Alkins)  Final    STAPHYLOCOCCUS HOMINIS SUSCEPTIBILITIES PERFORMED ON PREVIOUS CULTURE WITHIN THE LAST 5 DAYS. Performed at [facility], [place] [address] [place], [place] [address]    Report Status 10/18/2019 FINAL  Final  Blood culture (routine x 2)     Status: Abnormal   Collection Time: 10/15/19 12:45 AM   Specimen: BLOOD  Result Value Ref Range Status   Specimen Description   Final    BLOOD BLOOD LEFT FOREARM Performed at [place] [address], [place], [place] [address]    Special Requests   Final    BOTTLES DRAWN AEROBIC AND ANAEROBIC Blood Culture adequate volume Performed at [place] [address] [place], [place] [address]    Culture  Setup Time   Final    GRAM POSITIVE COCCI IN CLUSTERS AEROBIC BOTTLE ONLY Organism ID to follow CRITICAL RESULT CALLED TO, READ BACK BY AND VERIFIED WITH: Elenore Paddy PHARMD [id number] 10/15/19 Oakes Mccready BROWNING Performed at [facility], [place] [address], [place], [place] [address]    Culture STAPHYLOCOCCUS HOMINIS (Minna Dumire)  Final   Report Status 10/17/2019 FINAL  Final   Organism ID, Bacteria STAPHYLOCOCCUS HOMINIS  Final      Susceptibility   Staphylococcus hominis - MIC*    CIPROFLOXACIN <=0.5 SENSITIVE Sensitive     ERYTHROMYCIN INTERMEDIATE Intermediate     GENTAMICIN <=0.5 SENSITIVE Sensitive     OXACILLIN <=0.25 SENSITIVE Sensitive     TETRACYCLINE <=1 SENSITIVE Sensitive     VANCOMYCIN <=0.5 SENSITIVE Sensitive  TRIMETH/SULFA <=10 SENSITIVE Sensitive     CLINDAMYCIN >=8 RESISTANT Resistant     RIFAMPIN <=0.5 SENSITIVE  Sensitive     Inducible Clindamycin NEGATIVE Sensitive     * STAPHYLOCOCCUS HOMINIS  Blood Culture ID Panel (Reflexed)     Status: Abnormal   Collection Time: 10/15/19 12:45 AM  Result Value Ref Range Status   Enterococcus species NOT DETECTED NOT DETECTED Final   Listeria monocytogenes NOT DETECTED NOT DETECTED Final   Staphylococcus species DETECTED (Djuana Littleton) NOT DETECTED Final    Comment: Methicillin (oxacillin) resistant coagulase negative staphylococcus. Possible blood culture contaminant (unless isolated from more than one blood culture draw or clinical case suggests pathogenicity). No antibiotic treatment is indicated for blood  culture contaminants. CRITICAL RESULT CALLED TO, READ BACK BY AND VERIFIED WITH: L POINDEXTER PHARMD 2328 10/15/19 Khyler Eschmann BROWNING    Staphylococcus aureus (BCID) NOT DETECTED NOT DETECTED Final   Methicillin resistance DETECTED (Arturo Sofranko) NOT DETECTED Final    Comment: CRITICAL RESULT CALLED TO, READ BACK BY AND VERIFIED WITH: L POINDEXTER PHARMD 2328 10/15/19 Riyad Keena BROWNING    Streptococcus species NOT DETECTED NOT DETECTED Final   Streptococcus agalactiae NOT DETECTED NOT DETECTED Final   Streptococcus pneumoniae NOT DETECTED NOT DETECTED Final   Streptococcus pyogenes NOT DETECTED NOT DETECTED Final   Acinetobacter baumannii NOT DETECTED NOT DETECTED Final   Enterobacteriaceae species NOT DETECTED NOT DETECTED Final   Enterobacter cloacae complex NOT DETECTED NOT DETECTED Final   Escherichia coli NOT DETECTED NOT DETECTED Final   Klebsiella oxytoca NOT DETECTED NOT DETECTED Final   Klebsiella pneumoniae NOT DETECTED NOT DETECTED Final   Proteus species NOT DETECTED NOT DETECTED Final   Serratia marcescens NOT DETECTED NOT DETECTED Final   Haemophilus influenzae NOT DETECTED NOT DETECTED Final   Neisseria meningitidis NOT DETECTED NOT DETECTED Final   Pseudomonas aeruginosa NOT DETECTED NOT DETECTED Final   Candida albicans NOT DETECTED NOT DETECTED Final   Candida  glabrata NOT DETECTED NOT DETECTED Final   Candida krusei NOT DETECTED NOT DETECTED Final   Candida parapsilosis NOT DETECTED NOT DETECTED Final   Candida tropicalis NOT DETECTED NOT DETECTED Final    Comment: Performed at [facility], [place]. [address] [place], [place] [address]  Culture, Urine     Status: Abnormal   Collection Time: 10/15/19  2:15 AM   Specimen: Urine, Clean Catch  Result Value Ref Range Status   Specimen Description   Final    URINE, CLEAN CATCH Performed at [facility], [place] [address], [place], [place] [address]    Special Requests   Final    NONE Performed at [facility], [place] [address] [place], [place] [address]    Culture MULTIPLE SPECIES PRESENT, SUGGEST RECOLLECTION (Kanda Deluna)  Final   Report Status 10/16/2019 FINAL  Final  Culture, blood (routine x 2)     Status: None   Collection Time: 10/16/19  8:55 AM   Specimen: BLOOD LEFT HAND  Result Value Ref Range Status   Specimen Description   Final    BLOOD LEFT HAND Performed at [place] [address] [place], [place] [address]    Special Requests   Final    BOTTLES DRAWN AEROBIC ONLY Blood Culture adequate volume Performed at [facility] [address] [place], [place] [address]    Culture   Final    NO GROWTH 5 DAYS Performed at [facility], [place] [address] [place], [place]  [address]    Report Status 10/21/2019 FINAL  Final  Culture, blood (routine x 2)     Status: None   Collection Time: 10/16/19  9:28 AM   Specimen: BLOOD  Result Value Ref Range Status   Specimen Description   Final    BLOOD RIGHT ARM Performed at [place] [address] [place], [place] [address]    Special Requests   Final    BOTTLES DRAWN AEROBIC ONLY Blood Culture adequate volume Performed at [place] [address] [place], [place] [address]    Culture   Final    NO GROWTH 5  DAYS Performed at [facility], [place] [address] [place], [place] [address]    Report Status 10/21/2019 FINAL  Final         Radiology Studies: DG CHEST PORT 1 VIEW  Result Date: 10/20/2019 CLINICAL DATA:  Fever EXAM: PORTABLE CHEST 1 VIEW COMPARISON:  October 15, 2019 FINDINGS: There is atelectatic change in the lung bases. There are bullae in the medial right upper lobe. Lungs elsewhere are clear. Heart size and pulmonary vascularity are normal. No adenopathy. There is aortic atherosclerosis. No bone lesions. IMPRESSION: Mild bibasilar atelectasis. Bullous disease right upper lobe medially. No edema or airspace opacity. Heart size normal. Aortic Atherosclerosis (ICD10-I70.0). Electronically Signed   By: Lowella Grip III M.D.   On: 10/20/2019 08:58        Scheduled Meds: . docusate sodium  100 mg Oral BID  . feeding supplement  1 Container Oral TID BM  . feeding supplement (KATE FARMS STANDARD 1.4)  325 mL Oral Daily  . ferrous sulfate  325 mg Oral Q breakfast  . folic acid  1 mg Oral Daily  . multivitamin with minerals  1 tablet Oral Daily  . nicotine  21 mg Transdermal Daily  . sodium chloride flush  3 mL Intravenous Q12H  . traZODone  100 mg Oral QHS   Continuous Infusions: . sodium chloride 50 mL/hr at 10/21/19 1537     LOS: 6 days    Time spent: over 30 min    Fayrene Helper, MD [facility]   To contact the attending provider between 7A-7P or the covering provider during after hours 7P-7A, please log into the web site [email or web address] and access using universal [place] password for that web site. If you do not have the password, please call the hospital operator.  10/21/2019, 6:46 PM

## 2019-10-21 NOTE — Telephone Encounter (Signed)
Spoke to Legrand Como who agreed to re-establishing paramedicine encounters once discharged. I will call him prior to discharge to set up a plan for weekly meeting places and times. Call complete.

## 2019-10-21 NOTE — Plan of Care (Signed)
  Problem: Activity: Goal: Risk for activity intolerance will decrease Outcome: Progressing   Problem: Pain Managment: Goal: General experience of comfort will improve Outcome: Progressing   Problem: Nutrition: Goal: Adequate nutrition will be maintained Outcome: Completed/Met   Problem: Elimination: Goal: Will not experience complications related to bowel motility Outcome: Completed/Met

## 2019-10-22 DIAGNOSIS — N179 Acute kidney failure, unspecified: Secondary | ICD-10-CM

## 2019-10-22 DIAGNOSIS — R42 Dizziness and giddiness: Secondary | ICD-10-CM

## 2019-10-22 DIAGNOSIS — Z7289 Other problems related to lifestyle: Secondary | ICD-10-CM

## 2019-10-22 DIAGNOSIS — R7989 Other specified abnormal findings of blood chemistry: Secondary | ICD-10-CM

## 2019-10-22 DIAGNOSIS — D539 Nutritional anemia, unspecified: Secondary | ICD-10-CM

## 2019-10-22 LAB — COMPREHENSIVE METABOLIC PANEL
ALT: 36 U/L (ref 0–44)
AST: 70 U/L — ABNORMAL HIGH (ref 15–41)
Albumin: 2.5 g/dL — ABNORMAL LOW (ref 3.5–5.0)
Alkaline Phosphatase: 84 U/L (ref 38–126)
Anion gap: 8 (ref 5–15)
BUN: 6 mg/dL (ref 6–20)
CO2: 22 mmol/L (ref 22–32)
Calcium: 8.2 mg/dL — ABNORMAL LOW (ref 8.9–10.3)
Chloride: 103 mmol/L (ref 98–111)
Creatinine, Ser: 0.57 mg/dL — ABNORMAL LOW (ref 0.61–1.24)
GFR calc Af Amer: >60 mL/min (ref >60)
GFR calc non Af Amer: >60 mL/min (ref >60)
Glucose, Bld: 93 mg/dL (ref 70–99)
Potassium: 4 mmol/L (ref 3.5–5.1)
Sodium: 133 mmol/L — ABNORMAL LOW (ref 135–145)
Total Bilirubin: 1.5 mg/dL — ABNORMAL HIGH (ref 0.3–1.2)
Total Protein: 5.2 g/dL — ABNORMAL LOW (ref 6.5–8.1)

## 2019-10-22 LAB — CBC WITH DIFFERENTIAL/PLATELET
Abs Immature Granulocytes: 0.02 10*3/uL (ref 0.00–0.07)
Basophils Absolute: 0.1 10*3/uL (ref 0.0–0.1)
Basophils Relative: 1 %
Eosinophils Absolute: 0.1 10*3/uL (ref 0.0–0.5)
Eosinophils Relative: 3 %
HCT: 27.3 % — ABNORMAL LOW (ref 39.0–52.0)
Hemoglobin: 8.9 g/dL — ABNORMAL LOW (ref 13.0–17.0)
Immature Granulocytes: 0 %
Lymphocytes Relative: 28 %
Lymphs Abs: 1.6 10*3/uL (ref 0.7–4.0)
MCH: 35.6 pg — ABNORMAL HIGH (ref 26.0–34.0)
MCHC: 32.6 g/dL (ref 30.0–36.0)
MCV: 109.2 fL — ABNORMAL HIGH (ref 80.0–100.0)
Monocytes Absolute: 0.9 10*3/uL (ref 0.1–1.0)
Monocytes Relative: 16 %
Neutro Abs: 3 10*3/uL (ref 1.7–7.7)
Neutrophils Relative %: 52 %
Platelets: 123 10*3/uL — ABNORMAL LOW (ref 150–400)
RBC: 2.5 MIL/uL — ABNORMAL LOW (ref 4.22–5.81)
RDW: 16.3 % — ABNORMAL HIGH (ref 11.5–15.5)
WBC: 5.7 10*3/uL (ref 4.0–10.5)
nRBC: 0 % (ref 0.0–0.2)

## 2019-10-22 LAB — PHOSPHORUS: Phosphorus: 3.9 mg/dL (ref 2.5–4.6)

## 2019-10-22 LAB — CK: Total CK: 24 U/L — ABNORMAL LOW (ref 49–397)

## 2019-10-22 LAB — MAGNESIUM: Magnesium: 2.1 mg/dL (ref 1.7–2.4)

## 2019-10-22 LAB — ROCKY MTN SPOTTED FVR ABS PNL(IGG+IGM)
RMSF IgG: POSITIVE — AB
RMSF IgM: 0.38 index (ref 0.00–0.89)

## 2019-10-22 LAB — RMSF, IGG, IFA: RMSF, IGG, IFA: 1:256 {titer} — ABNORMAL HIGH

## 2019-10-22 MED ORDER — BOOST / RESOURCE BREEZE PO LIQD CUSTOM: 1.0000 | ORAL | Status: DC

## 2019-10-22 MED ORDER — PANTOPRAZOLE SODIUM 40 MG PO TBEC
40.0000 mg | DELAYED_RELEASE_TABLET | Freq: Two times a day (BID) | ORAL | Status: DC
  Administered 2019-10-22 – 2019-10-24 (×5): 40 mg via ORAL
  Filled 2019-10-22 (×5): qty 1

## 2019-10-22 NOTE — Consult Note (Addendum)
Consultation  Referring Provider:  Dr. Grandville Silos    Primary Care Physician:  Elsie Stain, MD Primary Gastroenterologist:  Althia Forts       Reason for Consultation:  Anemia            HPI:   Trevor Williams is a 59 y.o. 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  Apparently had been found on the side of the road and describes some dizziness and a hard time walking.  At that time noted to have a baseline chronic anemia with a hemoglobin around 11.2 at baseline.  We are consulted in regards to worsening of that anemia during his stay.    Today, the patient tells me that his largest concern is that he continues to be dizzy.  He denies seeing any blood in his stool including melena or bright red.  Tells me that he does have some reflux/heartburn which seems to come and go.  He drinks a sixpack of beer every day.  Explained that when he came in he was having diarrhea but over the past 2 days has had normal solid stools.  Denies abdominal pain.    Denies fever or symptoms that awaken him from sleep.     ER course: Right upper quadrant ultrasound with hepatic steatosis, cholelithiasis without evidence of acute cholecystitis, lipase 53, CMP with a potassium of 2.7, BUN 27, creatinine 1.54, AST 79, total bili 2.2, hemoglobin 11.2  Hospital course: Hemoglobin 11.2--> 9.6--> 9.9--> 8.5--> 8.3--> 8--> 8.7--> 7.4--> 8.3--> 8.1--> 8.9  Previous GI history: 01/28/2019 EGD Dr. Alessandra Bevels during hospitalization: - Non-obstructing Schatzki ring. - Gastritis. Biopsied. - Duodenitis. - Normal first portion of the duodenum and second portion of the duodenum. - Small hiatal hernia.  01/30/2019 colonoscopy during  hospitalization by Dr. Alessandra Bevels: -Preparation of the colon was inadequate. - Hemorrhoids found on perianal exam. - Stool in the entire examined colon. - One 10 mm polyp in the cecum, removed with a hot snare. Resected and retrieved. Clip was placed. - One 12 mm polyp in the sigmoid colon at 45 cm proximal to the anus, removed piecemeal using a hot snare. Resected and retrieved. - One 4 mm polyp in the sigmoid colon, removed piecemeal using a cold biopsy forceps. Resected and retrieved. - One 7 mm polyp in the proximal rectum, removed with a hot snare. Resected and retrieved. - Granular and nodular mucosa in the distal rectum. Biopsied. - Internal hemorrhoids.  Plan: Repeat recommended in 1 year due to suboptimal preparation   Past Medical History:  Diagnosis Date  . Abnormal liver function tests   . Acute respiratory failure ([place])    a. 07/2018 requiring intubation - CAP/CHF.  Marland Kitchen Alcohol abuse   . Biventricular heart failure ([place])   . Cancer ([place])    skin (left hand)  . Delirium    a. h/o delirium while admitted  . Emphysema of lung ([place])   . Homelessness   . Macrocytic anemia   . Mild CAD    a. cath 07/2018 60% ostial ramus otherwise OK.  . Mitral regurgitation   . NICM (nonischemic cardiomyopathy) ([place])   . Protein calorie malnutrition ([place])   . Tricuspid regurgitation     Past Surgical History:  Procedure Laterality  Date  . BIOPSY  01/28/2019   Procedure: BIOPSY;  Surgeon: Otis Brace, MD;  Location: [facility];  Service: Gastroenterology;;  . BIOPSY  01/30/2019   Procedure: BIOPSY;  Surgeon: Otis Brace, MD;  Location: [facility];  Service: Gastroenterology;;  . COLONOSCOPY WITH PROPOFOL N/A 01/30/2019   Procedure: COLONOSCOPY WITH PROPOFOL;  Surgeon: Otis Brace, MD;  Location: [facility];  Service: Gastroenterology;  Laterality: N/A;  . ESOPHAGOGASTRODUODENOSCOPY (EGD) WITH PROPOFOL N/A 01/28/2019   Procedure: ESOPHAGOGASTRODUODENOSCOPY  (EGD) WITH PROPOFOL;  Surgeon: Otis Brace, MD;  Location: [facility];  Service: Gastroenterology;  Laterality: N/A;  . FEMUR IM NAIL Right 04/30/2019   Procedure: INTRAMEDULLARY (IM) NAIL FEMORAL;  Surgeon: Paralee Cancel, MD;  Location: [facility];  Service: Orthopedics;  Laterality: Right;  . HEMOSTASIS CLIP PLACEMENT  01/30/2019   Procedure: HEMOSTASIS CLIP PLACEMENT;  Surgeon: Otis Brace, MD;  Location: [facility];  Service: Gastroenterology;;  . POLYPECTOMY  01/30/2019   Procedure: POLYPECTOMY;  Surgeon: Otis Brace, MD;  Location: [facility];  Service: Gastroenterology;;  . RIGHT/LEFT HEART CATH AND CORONARY ANGIOGRAPHY N/A 08/09/2018   Procedure: RIGHT/LEFT HEART CATH AND CORONARY ANGIOGRAPHY;  Surgeon: Larey Dresser, MD;  Location: [facility];  Service: Cardiovascular;  Laterality: N/A;    Family History  Problem Relation Age of Onset  . Hypertension Maternal Grandfather     Social History   Tobacco Use  . Smoking status: Current Every Day Smoker    Packs/day: 1.00    Types: Cigarettes  . Smokeless tobacco: Current User  Vaping Use  . Vaping Use: Never used  Substance Use Topics  . Alcohol use: Yes    Alcohol/week: 12.0 standard drinks    Types: 12 Cans of beer per week    Comment: Liquor daily - 1/2 of a fifth   . Drug use: No    Prior to Admission medications   Medication Sig Start Date End Date Taking? Authorizing Provider  meloxicam (MOBIC) 7.5 MG tablet TAKE 1 TABLET (7.5 MG TOTAL) BY MOUTH DAILY. Patient not taking: Reported on 07/31/2019 07/25/19   Elsie Stain, MD  albuterol (VENTOLIN HFA) 108 (90 Base) MCG/ACT inhaler Inhale 2 puffs into the lungs every 4 (four) hours as needed for wheezing or shortness of breath. Patient not taking: Reported on 07/31/2019 03/20/19   Elsie Stain, MD  carvedilol (COREG) 3.125 MG tablet Take 1 tablet (3.125 mg total) by mouth 2 (two) times daily with a meal. Patient not taking: Reported on  07/31/2019 06/25/19   Elsie Stain, MD  folic acid (FOLVITE) 1 MG tablet Take 1 tablet (1 mg total) by mouth daily. Patient not taking: Reported on 08/21/2019 06/25/19   Elsie Stain, MD  gabapentin (NEURONTIN) 300 MG capsule Take 1 capsule (300 mg total) by mouth 3 (three) times daily. Patient not taking: Reported on 07/31/2019 06/25/19   Elsie Stain, MD  HYDROcodone-acetaminophen (NORCO/VICODIN) 5-325 MG tablet Take 1-2 tablets by mouth every 6 (six) hours as needed for moderate pain (pain score 4-6). Patient not taking: Reported on 07/24/2019 05/01/19   Maurice March, PA-C  pantoprazole (PROTONIX) 40 MG tablet Take 1 tablet (40 mg total) by mouth daily. Patient not taking: Reported on 08/21/2019 06/25/19   Elsie Stain, MD  rosuvastatin (CRESTOR) 10 MG tablet Take 1 tablet (10 mg total) by mouth daily at 6 PM. Patient not taking: Reported on 07/31/2019 06/25/19   Elsie Stain, MD  sacubitril-valsartan (ENTRESTO) 24-26 MG Take 1 tablet  by mouth 2 (two) times daily. Patient not taking: Reported on 07/31/2019 06/25/19   Elsie Stain, MD  thiamine 100 MG tablet Take 1 tablet (100 mg total) by mouth daily. Patient not taking: Reported on 08/21/2019 03/20/19   Elsie Stain, MD    Current Facility-Administered Medications  Medication Dose Route Frequency Provider Last Rate Last Admin  . 0.9 %  sodium chloride infusion (Manually program via Guardrails IV Fluids)   Intravenous Once Elodia Florence., MD      . 0.9 %  sodium chloride infusion   Intravenous Continuous Toy Baker, MD 50 mL/hr at 10/21/19 1537 New Bag at 10/21/19 1537  . alum & mag hydroxide-simeth (MAALOX/MYLANTA) 200-200-20 MG/5ML suspension 30 mL  30 mL Oral Q4H PRN Elodia Florence., MD   30 mL at 10/17/19 2322  . docusate sodium (COLACE) capsule 100 mg  100 mg Oral BID Toy Baker, MD   100 mg at 10/21/19 2211  . feeding supplement (BOOST / RESOURCE BREEZE) liquid 1 Container  1 Container  Oral TID BM Elodia Florence., MD   1 Container at 10/19/19 0848  . feeding supplement (KATE FARMS STANDARD 1.4) liquid 325 mL  325 mL Oral Daily Elodia Florence., MD   325 mL at 10/19/19 2009  . ferrous sulfate tablet 325 mg  325 mg Oral Q breakfast Elodia Florence., MD   325 mg at 10/21/19 0827  . [START ON 10/25/2019] fluconazole (DIFLUCAN) tablet 150 mg  150 mg Oral Once Elodia Florence., MD      . folic acid (FOLVITE) tablet 1 mg  1 mg Oral Daily Doutova, Anastassia, MD   1 mg at 10/21/19 1126  . loperamide (IMODIUM) capsule 2 mg  2 mg Oral PRN Elodia Florence., MD   2 mg at 10/18/19 0345  . multivitamin with minerals tablet 1 tablet  1 tablet Oral Daily Doutova, Anastassia, MD   1 tablet at 10/21/19 0827  . nicotine (NICODERM CQ - dosed in mg/24 hours) patch 21 mg  21 mg Transdermal Daily Doutova, Anastassia, MD   21 mg at 10/18/19 1021  . ondansetron (ZOFRAN) tablet 4 mg  4 mg Oral Q6H PRN Doutova, Anastassia, MD       Or  . ondansetron (ZOFRAN) injection 4 mg  4 mg Intravenous Q6H PRN Doutova, Anastassia, MD   4 mg at 10/17/19 0231  . oxyCODONE (Oxy IR/ROXICODONE) immediate release tablet 5 mg  5 mg Oral Q4H PRN Elodia Florence., MD   5 mg at 10/21/19 1858  . pantoprazole (PROTONIX) EC tablet 40 mg  40 mg Oral BID AC Eugenie Filler, MD      . sodium chloride flush (NS) 0.9 % injection 3 mL  3 mL Intravenous Q12H Toy Baker, MD   3 mL at 10/21/19 2214  . thiamine (B-1) injection 100 mg  100 mg Intravenous Daily Elodia Florence., MD   100 mg at 10/21/19 2212  . traZODone (DESYREL) tablet 100 mg  100 mg Oral QHS Elodia Florence., MD   100 mg at 10/21/19 2211    Allergies as of 10/14/2019 - Review Complete 10/14/2019  Allergen Reaction Noted  . Spironolactone  07/17/2019  . Doxycycline Rash 08/02/2018  . Ibuprofen Rash 03/12/2017  . Rocephin [ceftriaxone] Rash 08/02/2018  . Tylenol [acetaminophen] Rash 03/12/2017  . Vancomycin  Rash 08/02/2018     Review of Systems:  Constitutional: No weight loss, fever or chills Skin: No rash  Cardiovascular: No chest pain   Respiratory: No SOB  Gastrointestinal: See HPI and otherwise negative Genitourinary: No dysuria  Neurological: +dizziness Musculoskeletal: No new muscle or joint pain Hematologic: No bleeding or bruising Psychiatric: No history of depression or anxiety    Physical Exam:  Vital signs in last 24 hours: Temp:  [98.2 F (36.8 C)-99.6 F (37.6 C)] 98.6 F (37 C) (08/04 0336) Pulse Rate:  [71-89] 71 (08/04 0336) Resp:  [14-18] 15 (08/04 0336) BP: (102-123)/(65-86) 118/75 (08/04 0336) SpO2:  [92 %-100 %] 92 % (08/04 0336) Weight:  [59.9 kg] 59.9 kg (08/04 0617) Last BM Date: 10/21/19 General:   Pleasant Caucasian male appears to be in NAD, Well developed, Well nourished, alert and cooperative Head:  Normocephalic and atraumatic. Eyes:   PEERL, EOMI. No icterus. Conjunctiva pink. Ears:  Normal auditory acuity. Neck:  Supple Throat: Oral cavity and pharynx without inflammation, swelling or lesion. Lungs: Respirations even and unlabored. Lungs clear to auscultation bilaterally.   No wheezes, crackles, or rhonchi.  Heart: Normal S1, S2. No MRG. Regular rate and rhythm. No peripheral edema, cyanosis or pallor.  Abdomen:  Soft, nondistended, moderate epigastric ttp. No rebound or guarding. Normal bowel sounds. No appreciable masses or hepatomegaly. Rectal:  Not performed.  Msk:  Symmetrical without gross deformities. Peripheral pulses intact.  Extremities:  Without edema, no deformity or joint abnormality.  Neurologic:  Alert and  oriented x4;  grossly normal neurologically.  Skin:   Dry and intact without significant lesions or rashes. Psychiatric: Demonstrates good judgement and reason without abnormal affect or behaviors.   LAB RESULTS: Recent Labs    10/20/19 0522 10/20/19 0522 10/20/19 1355 10/21/19 0550 10/22/19 0550  WBC 4.6  --   --   4.9 5.7  HGB 7.4*   < > 8.3* 8.1* 8.9*  HCT 22.8*   < > 25.5* 24.6* 27.3*  PLT 98*  --   --  111* 123*   < > = values in this interval not displayed.   BMET Recent Labs    10/20/19 0522 10/21/19 0550 10/22/19 0550  NA 135 132* 133*  K 4.2 4.3 4.0  CL 103 101 103  CO2 _0 GLUCOSE 82 83 93  BUN _1 CREATININE 0.61 0.63 0.57*  CALCIUM 8.0* 8.1* 8.2*   Hepatic Function Latest Ref Rng & Units 10/22/2019 10/21/2019 10/20/2019  Total Protein 6.5 - 8.1 g/dL 5.2(L) 5.3(L) 5.5(L)  Albumin 3.5 - 5.0 g/dL 2.5(L) 2.5(L) 2.7(L)  AST 15 - 41 U/L 70(H) 79(H) 83(H)  ALT 0 - 44 U/L 36 37 35  Alk Phosphatase 38 - 126 U/L 84 79 84  Total Bilirubin 0.3 - 1.2 mg/dL 1.5(H) 1.0 1.0  Bilirubin, Direct 0.0 - 0.2 mg/dL - - -    Impression / Plan:   Impression: 1.  Anemia: Hemoglobin most recently 8.1 --> 1 unit PRBC -->8.9 (was 11.2 at admission), some of this likely from fluid replacement, no overt signs of GI bleeding, recent EGD and colonoscopy in November 2020 2.  Elevated LFTs: Staying somewhat stable since admission, likely related to alcohol abuse 3.  Thrombocytopenia: 98--> 123 since admission; likely related to liver disease/alcohol abuse 4.  Chronic systolic heart failure 5.  AKI 6.  Dehydration 7.  Alcohol abuse 8.  GERD  Plan: 1.  No overt signs of GI bleeding, recent endoscopic procedures at the end of 2020. 2.  Continue Pantoprazole  40 twice daily for reflux. 3.  Continue to monitor hemoglobin with transfusion as needed less than 7 4.  Please await further recommendations from Dr. Ardis Hughs later today.  Thank you for your kind consultation.  Lavone Nian [facility]  10/22/2019, 9:13 AM  ________________________________________________________________________  Velora Heckler GI MD note:  I personally examined the patient, reviewed the data and agree with the assessment and plan described above.  He has gradual anemia during this admission however no overt GI bleeding and he was  hemocult negative in the ER last week.  He had EGD and colonoscopy 10 months ago ([facility]) see those results above. I think his drifting Hb is likely dilutional +/- chronic disease related.  I do not plan invasive GI testing during this admission.  Please transfuse PRN and definitely call back if he has obvious, overt GI bleeding anytime. At discharge he should be instructed to call my office at [place] for a follow up appointment in the next several weeks.  Please call or page with any further questions or concerns.   Owens Loffler, MD [facility] Gastroenterology Pager [phone]

## 2019-10-22 NOTE — Progress Notes (Signed)
Nutrition Follow-up  DOCUMENTATION CODES:   Not applicable  INTERVENTION:  - continue Boost Breeze but will decrease from TID to once/day. - continue [employer] 1.4 po once/day. - will complete NFPE at follow-up.   NUTRITION DIAGNOSIS:   Increased nutrient needs related to acute illness, chronic illness as evidenced by estimated needs. -ongoing  GOAL:   Patient will meet greater than or equal to 90% of their needs -minimally met   MONITOR:   PO intake, Supplement acceptance, Labs, Weight trends  ASSESSMENT:   59 y.o. 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 He presented to the ED with dizziness, difficulty ambulating, chills, sweats, and diarrhea.  Patient is currently out of the room to Diagnostic Radiology. Per flow sheet documentation he recently consumed the following at meals:  8/1- 100% of breakfast, 75% of lunch, 50% of dinner (total of 1452 kcal, 63 grams protein) 8/2- 50% of breakfast, 60% of lunch, 30% of dinner (total of 797 kcal, 40 grams protein) 8/3- 100% of lunch, 100% of dinner (total of 1353 kcal, 68 grams protein)  Per review of orders, he accepted Boost Breeze 100% of the time the first half of the cartons offered but for the second half of the cartons offered, he has been accepting it 0% of the time. He has been accepting [employer] 75% of the time offered.  Weight today is trending up and is the highest weight this hospitalization.     Labs reviewed; Na: 133 mmol/l, creatinine: 0.57 mg/dl, Ca: 8.2 mg/dl. Medications reviewed; 100 mg colace BID, 325 mg ferrous sulfate/day, 1 mg folvite/day, PRN imodium, 4 mg IV Mg sulfate x1 dose 8/3, 1 tablet multivitamin with minerals/day, 40 mg oral protonix BID, 100 mg IV thiamine/day.  IVF; NS @ 50 ml/hr.    NUTRITION - FOCUSED PHYSICAL EXAM:  unable to complete at this  time.   Diet Order:   Diet Order            Diet Heart Room service appropriate? Yes; Fluid consistency: Thin  Diet effective now                 EDUCATION NEEDS:   No education needs have been identified at this time  Skin:  Skin Assessment: Reviewed RN Assessment  Last BM:  8/3  Height:   Ht Readings from Last 1 Encounters:  10/16/19 '5\' 6"'$  (1.676 m)    Weight:   Wt Readings from Last 1 Encounters:  10/22/19 59.9 kg    Estimated Nutritional Needs:  Kcal:  1800-2000 kcal Protein:  75-90 grams Fluid:  >/= 2 L/day     [name], MS, RD, LDN, CNSC Inpatient Clinical Dietitian RD pager # available in [facility]  After hours/weekend pager # available in [facility]

## 2019-10-22 NOTE — Progress Notes (Signed)
PROGRESS NOTE    Trevor Williams  ZOX:[id number] DOB: 1960-09-13 DOA: 10/14/2019 PCP: Elsie Stain, MD    Chief Complaint  Patient presents with  . 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  Initially concern for coag negative staph bacteremia, but noted to be contaminant.  Doxycycline was started for tick exposure and concern for tick borne illness, though now d/c'd by ID.  Last fever was 8/2 AM.  Was hoping to discharge soon, but he's had continued lightheadedness and dizziness.    Transfuse 1 unit packed red blood cells.  Given drop in Hb from 11 to 8, follow hemoccult and GI consulted for further evaluation.    Assessment & Plan:   Principal Problem:   Positive blood culture Active Problems:   Alcohol use   Homeless   Hypokalemia   Tobacco use disorder   Chronic systolic heart failure (HCC)   Elevated LFTs   Hypomagnesemia   Dehydration   Dizziness   COPD (chronic obstructive pulmonary disease) (HCC)   Macrocytic anemia  1 lightheadedness/dizziness likely secondary to orthostasis Patient noted with complaints of lightheadedness and dizziness.  Orthostatics checked on 10/21/2019 were positive.  Patient noted to have had some lightheadedness and difficulty with ambulation on 10/21/2019 and noted on ambulation to go with lightheadedness and described as shaky on his feet with walker.  Head CT/MRI brain done negative for any acute abnormalities.  Patient status post transfusion of 1 unit packed red blood cells.  Patient with no overt bleeding.  Place TED hose.  Repeat orthostatics.  If patient is orthostatic we will increase IV fluids to 100 cc/h.  Supportive care.   Follow.  2.  Iron deficiency anemia Patient with no overt GI bleed.  Patient noted to have a drop in hemoglobin from 11.2 on 10/14/2019 down to 7.4 on 10/20/2019.  Patient transfused a unit packed red blood cells.  Hemoglobin currently at 8.9.  Due to drop in hemoglobin, prior history of sessile polyps, gastritis and duodenitis(per EGD and colonoscopy 01/28/2019) GI consulted who feel anemia likely dilutional in nature and do not recommend any invasive GI testing at this time and recommending transfusion as needed and to follow-up with GI in the outpatient setting in the next several weeks.  Transfusion threshold hemoglobin less than 7.  Continue PPI.  Continue oral iron supplementation.  Follow H&H.  3.  Dehydration IV fluids.  4.  Coagulase-negative staph bacteremia Noted in 3/4 bottles concerning for the bacteremia despite coagulase-negative.  Repeat blood cultures obtained with no growth to date.  Dr. Florene Glen discussed with microbiology, ID consulted who recommended discontinuation of IV antibiotics and to monitor off IV antibiotics.  Repeat blood cultures with no growth to date.  Patient afebrile since 10/20/2019.  Supportive care.  5.  Concern for tickborne illness Patient noted to have pulled out for ticks this summer.  Initially started on doxycycline which was subsequently discontinued.  ID recommending to monitor off doxycycline.  6.  Tinea corporis Patient noted to have confluent erythematous plaque on back and abdomen with overlying scale more defined/raised border.  Felt likely fungal in nature.  Patient initially ordered fluconazole daily however changed to once weekly x2 weeks with 1 more dose due on 10/25/2019.  Follow.  7.  Chronic systolic heart failure Currently stable.  Patient with no overt fluid overload.  2D echo with a EF of 30% from 04/2019.  Strict I's and O's.  Daily weights.  Monitor with hydration.  8.  Pyuria In the setting of chills.  Patient currently afebrile.  No  leukocytosis.  Blood cultures (10/16/2019) with no growth to date.  Urine cultures with multiple species present.  Follow.  9.  Acute kidney injury Baseline creatinine less than 1.  Presentation patient noted to have a creatinine of 1.57.  Renal ultrasound negative for hydronephrosis.  Patient hydrated with IV fluids with improvement with acute kidney injury.  Creatinine currently at 0.57.  Patient on gentle hydration due to orthostasis.  Follow.  10.  Alcohol abuse Continue the Ativan withdrawal protocol, thiamine, folic acid, multivitamin.  11.  Transaminitis Secondary to alcohol abuse.  Right upper quadrant ultrasound notable for cholelithiasis without cholecystitis and also notable for steatosis.  HIV negative.  Acute hepatitis panel negative.  Outpatient follow-up.  12.  Hypokalemia Repleted.  13.  Hypomagnesemia Repleted.  14.  Tobacco abuse Tobacco cessation stressed to patient.  At this time patient does not want a nicotine patch but will let us know once he changes his mind.  15.  Chronic frontotemporal encephalomalacia Stable.  Outpatient follow-up.    DVT prophylaxis: SCDs Code Status: Full Family Communication: Updated patient.  No family at bedside. Disposition:   Status is: Inpatient    Dispo: The patient is from: Homeless              Anticipated d/c is to: To be determined              Anticipated d/c date is: Hopefully 1 to 2 days.              Patient currently with complaints of lightheadedness and dizziness on standing with ambulation, orthostatic.  Not stable for discharge.       Consultants:   Gastroenterology: Dr. Ardis Hughs 10/22/2019  ID: Dr. Megan Salon 10/16/2019  Procedures:  CT head 10/14/2019  Chest x-ray 10/15/2019, 10/20/2019  MRI brain 10/14/2019  Renal ultrasound 10/15/2019  Right upper quadrant ultrasound 10/15/2019  2D echo 10/15/2019  Transfusion 1 unit packed red blood cells 10/22/2019  Antimicrobials:   IV daptomycin 10/16/2018>>>>  10/18/2019     Subjective: Patient laying in bed.  Sitting up in bed.  Denies any chest pain or shortness of breath.  Unsure whether still lightheaded or dizzy as he states that occurs usually when he stands up to ambulate and has not ambulated today.  Denies any bleeding.  Objective: Vitals:   10/22/19 0107 10/22/19 0108 10/22/19 0336 10/22/19 0617  BP: 120/74 120/70 118/75   Pulse: 79 79 71   Resp: 16 16 15    Temp: 99.4 F (37.4 C) 99.4 F (37.4 C) 98.6 F (37 C)   TempSrc:  Oral Oral   SpO2:  92% 92%   Weight:    59.9 kg  Height:        Intake/Output Summary (Last 24 hours) at 10/22/2019 1044 Last data filed at 10/22/2019 0342 Gross per 24 hour  Intake 2641.81 ml  Output 1375 ml  Net 1266.81 ml   Filed Weights   10/19/19 0555 10/20/19 0604 10/22/19 0617  Weight: 53.1 kg 52.9 kg 59.9 kg    Examination:  General exam: NAD.  Respiratory system: Clear to auscultation. Respiratory effort normal. Cardiovascular system: S1 & S2 heard, RRR. No JVD, murmurs, rubs, gallops  or clicks. No pedal edema. Gastrointestinal system: Abdomen is nondistended, soft and nontender. No organomegaly or masses felt. Normal bowel sounds heard. Central nervous system: Alert and oriented. No focal neurological deficits. Extremities: Symmetric 5 x 5 power. Skin: No rashes, lesions or ulcers Psychiatry: Judgement and insight appear normal. Mood & affect appropriate.     Data Reviewed: I have personally reviewed following labs and imaging studies  CBC: Recent Labs  Lab 10/18/19 0604 10/18/19 0604 10/19/19 0607 10/20/19 0522 10/20/19 1355 10/21/19 0550 10/22/19 0550  WBC 5.8  --  5.7 4.6  --  4.9 5.7  NEUTROABS 3.6  --  3.4 2.5  --  3.2 3.0  HGB 8.0*   < > 8.7* 7.4* 8.3* 8.1* 8.9*  HCT 24.3*   < > 26.4* 22.8* 25.5* 24.6* 27.3*  MCV 114.1*  --  114.8* 114.6*  --  112.3* 109.2*  PLT 82*  --  101* 98*  --  111* 123*   < > = values in this interval not displayed.    Basic Metabolic  Panel: Recent Labs  Lab 10/18/19 0604 10/19/19 0607 10/20/19 0522 10/21/19 0550 10/22/19 0550  NA 138 136 135 132* 133*  K 3.6 3.7 4.2 4.3 4.0  CL 104 104 103 101 103  CO2 26 25 25 25 22   GLUCOSE 93 87 82 83 93  BUN 9 7 8 8 6   CREATININE 0.55* 0.57* 0.61 0.63 0.57*  CALCIUM 8.4* 8.3* 8.0* 8.1* 8.2*  MG 1.4* 1.5* 1.5* 1.4* 2.1  PHOS 2.4* 3.1 3.2 3.5 3.9    GFR: Estimated Creatinine Clearance: 85.3 mL/min (A) (by C-G formula based on SCr of 0.57 mg/dL (L)).  Liver Function Tests: Recent Labs  Lab 10/18/19 0604 10/19/19 0607 10/20/19 0522 10/21/19 0550 10/22/19 0550  AST 90* 80* 83* 79* 70*  ALT 40 37 35 37 36  ALKPHOS 87 85 84 79 84  BILITOT 1.0 1.1 1.0 1.0 1.5*  PROT 5.8* 5.9* 5.5* 5.3* 5.2*  ALBUMIN 3.0* 2.8* 2.7* 2.5* 2.5*    CBG: No results for input(s): GLUCAP in the last 168 hours.   Recent Results (from the past 240 hour(s))  SARS Coronavirus 2 by RT PCR (hospital order, performed in [facility] hospital lab) Nasopharyngeal Nasopharyngeal Swab     Status: None   Collection Time: 10/15/19 12:22 AM   Specimen: Nasopharyngeal Swab  Result Value Ref Range Status   SARS Coronavirus 2 NEGATIVE NEGATIVE Final    Comment: (NOTE) SARS-CoV-2 target nucleic acids are NOT DETECTED.  The SARS-CoV-2 RNA is generally detectable in upper and lower respiratory specimens during the acute phase of infection. The lowest concentration of SARS-CoV-2 viral copies this assay can detect is 250 copies / mL. A negative result does not preclude SARS-CoV-2 infection and should not be used as the sole basis for treatment or other patient management decisions.  A negative result may occur with improper specimen collection / handling, submission of specimen other than nasopharyngeal swab, presence of viral mutation(s) within the areas targeted by this assay, and inadequate number of viral copies (<250 copies / mL). A negative result must be combined with clinical observations,  patient history, and epidemiological information.  Fact Sheet for Patients:   StrictlyIdeas.no  Fact Sheet for Healthcare Providers: BankingDealers.co.za  This test is not yet approved or  cleared by the [place] FDA and has been authorized for detection and/or diagnosis of SARS-CoV-2 by FDA under an Emergency Use Authorization (EUA).  This EUA will remain in  effect (meaning this test can be used) for the duration of the COVID-19 declaration under Section 564(b)(1) of the Act, 21 U.S.C. section 360bbb-3(b)(1), unless the authorization is terminated or revoked sooner.  Performed at [facility], [place] [address], [place], [place] [address]   Blood culture (routine x 2)     Status: Abnormal   Collection Time: 10/15/19 12:45 AM   Specimen: BLOOD  Result Value Ref Range Status   Specimen Description   Final    BLOOD BLOOD RIGHT ARM Performed at [place] [address], [place], [place] [address]    Special Requests   Final    BOTTLES DRAWN AEROBIC AND ANAEROBIC Blood Culture results may not be optimal due to an inadequate volume of blood received in culture bottles Performed at [facility] [place] [address], [place], [place] [address]    Culture  Setup Time   Final    GRAM POSITIVE COCCI IN CLUSTERS IN BOTH AEROBIC AND ANAEROBIC BOTTLES CRITICAL VALUE NOTED.  VALUE IS CONSISTENT WITH PREVIOUSLY REPORTED AND CALLED VALUE.    Culture (A)  Final    STAPHYLOCOCCUS HOMINIS SUSCEPTIBILITIES PERFORMED ON PREVIOUS CULTURE WITHIN THE LAST 5 DAYS. Performed at [facility], [place] [address], [place], [place] [address]    Report Status 10/18/2019 FINAL  Final  Blood culture (routine x 2)     Status: Abnormal   Collection Time: 10/15/19 12:45 AM   Specimen: BLOOD  Result Value Ref Range Status   Specimen Description   Final    BLOOD BLOOD LEFT FOREARM Performed at [place] [address] [place], [place] [address]    Special Requests   Final    BOTTLES DRAWN AEROBIC AND ANAEROBIC Blood Culture adequate volume Performed at [place] [address], [place], [place] [address]    Culture  Setup Time   Final    GRAM POSITIVE COCCI IN CLUSTERS AEROBIC BOTTLE ONLY Organism ID to follow CRITICAL RESULT CALLED TO, READ BACK BY AND VERIFIED WITH: Elenore Paddy PHARMD [id number] 10/15/19 A BROWNING Performed at [facility], [place] [address] [place], [place] [address]    Culture STAPHYLOCOCCUS HOMINIS (A)  Final   Report Status 10/17/2019 FINAL  Final   Organism ID, Bacteria STAPHYLOCOCCUS HOMINIS  Final      Susceptibility   Staphylococcus hominis - MIC*    CIPROFLOXACIN <=0.5 SENSITIVE Sensitive     ERYTHROMYCIN INTERMEDIATE Intermediate     GENTAMICIN <=0.5 SENSITIVE Sensitive     OXACILLIN <=0.25 SENSITIVE Sensitive     TETRACYCLINE <=1 SENSITIVE Sensitive     VANCOMYCIN <=0.5 SENSITIVE Sensitive     TRIMETH/SULFA <=10 SENSITIVE Sensitive     CLINDAMYCIN >=8 RESISTANT Resistant     RIFAMPIN <=0.5 SENSITIVE Sensitive     Inducible Clindamycin NEGATIVE Sensitive     * STAPHYLOCOCCUS HOMINIS  Blood Culture ID Panel (Reflexed)     Status: Abnormal   Collection Time: 10/15/19 12:45 AM  Result Value Ref Range Status   Enterococcus species NOT DETECTED NOT DETECTED Final   Listeria monocytogenes NOT DETECTED NOT DETECTED Final   Staphylococcus species DETECTED (A) NOT DETECTED Final    Comment: Methicillin (oxacillin) resistant coagulase negative staphylococcus. Possible blood culture contaminant (unless isolated from more than one blood culture draw or clinical case suggests pathogenicity). No antibiotic treatment is indicated for blood  culture contaminants. CRITICAL RESULT CALLED TO, READ BACK BY AND VERIFIED WITH: L  POINDEXTER PHARMD 2328 10/15/19 A BROWNING    Staphylococcus aureus (BCID) NOT DETECTED NOT  DETECTED Final   Methicillin resistance DETECTED (A) NOT DETECTED Final    Comment: CRITICAL RESULT CALLED TO, READ BACK BY AND VERIFIED WITH: L POINDEXTER PHARMD 2328 10/15/19 A BROWNING    Streptococcus species NOT DETECTED NOT DETECTED Final   Streptococcus agalactiae NOT DETECTED NOT DETECTED Final   Streptococcus pneumoniae NOT DETECTED NOT DETECTED Final   Streptococcus pyogenes NOT DETECTED NOT DETECTED Final   Acinetobacter baumannii NOT DETECTED NOT DETECTED Final   Enterobacteriaceae species NOT DETECTED NOT DETECTED Final   Enterobacter cloacae complex NOT DETECTED NOT DETECTED Final   Escherichia coli NOT DETECTED NOT DETECTED Final   Klebsiella oxytoca NOT DETECTED NOT DETECTED Final   Klebsiella pneumoniae NOT DETECTED NOT DETECTED Final   Proteus species NOT DETECTED NOT DETECTED Final   Serratia marcescens NOT DETECTED NOT DETECTED Final   Haemophilus influenzae NOT DETECTED NOT DETECTED Final   Neisseria meningitidis NOT DETECTED NOT DETECTED Final   Pseudomonas aeruginosa NOT DETECTED NOT DETECTED Final   Candida albicans NOT DETECTED NOT DETECTED Final   Candida glabrata NOT DETECTED NOT DETECTED Final   Candida krusei NOT DETECTED NOT DETECTED Final   Candida parapsilosis NOT DETECTED NOT DETECTED Final   Candida tropicalis NOT DETECTED NOT DETECTED Final    Comment: Performed at [facility], [place] [address], [place], [place] [address]  Culture, Urine     Status: Abnormal   Collection Time: 10/15/19  2:15 AM   Specimen: Urine, Clean Catch  Result Value Ref Range Status   Specimen Description   Final    URINE, CLEAN CATCH Performed at [facility], [place] [address] [place], [place] [address]    Special Requests   Final    NONE Performed at [facility], [place] [address] [place], [place] [address]    Culture MULTIPLE SPECIES PRESENT, SUGGEST RECOLLECTION (A)  Final   Report Status 10/16/2019 FINAL  Final  Culture,  blood (routine x 2)     Status: None   Collection Time: 10/16/19  8:55 AM   Specimen: BLOOD LEFT HAND  Result Value Ref Range Status   Specimen Description   Final    BLOOD LEFT HAND Performed at [facility] [address], [place], [place] [address]    Special Requests   Final    BOTTLES DRAWN AEROBIC ONLY Blood Culture adequate volume Performed at [place] [address], [place], [place] [address]    Culture   Final    NO GROWTH 5 DAYS Performed at [facility], [place] [address] [place], [place] [address]    Report Status 10/21/2019 FINAL  Final  Culture, blood (routine x 2)     Status: None   Collection Time: 10/16/19  9:28 AM   Specimen: BLOOD  Result Value Ref Range Status   Specimen Description   Final    BLOOD RIGHT ARM Performed at [place] [address], [place], [place] [address]    Special Requests   Final    BOTTLES DRAWN AEROBIC ONLY Blood Culture adequate volume Performed at [facility] [address], [place], [place] [address]    Culture   Final    NO GROWTH 5 DAYS Performed at [facility], [place] [address] [place], [place] [address]    Report Status 10/21/2019 FINAL  Final  Radiology Studies: No results found.      Scheduled Meds: . sodium chloride   Intravenous Once  . docusate sodium  100 mg Oral BID  . feeding supplement  1 Container Oral TID BM  . feeding supplement (KATE FARMS STANDARD 1.4)  325 mL Oral Daily  . ferrous sulfate  325 mg Oral Q breakfast  . [START ON 10/25/2019] fluconazole  150 mg Oral Once  . folic acid  1 mg Oral Daily  . multivitamin with minerals  1 tablet Oral Daily  . nicotine  21 mg Transdermal Daily  . pantoprazole  40 mg Oral BID AC  . sodium chloride flush  3 mL Intravenous Q12H  . thiamine injection  100 mg Intravenous Daily  . traZODone  100 mg Oral QHS   Continuous Infusions: . sodium chloride 50  mL/hr at 10/21/19 1537     LOS: 7 days    Time spent: 35 minutes    Irine Seal, MD [facility]   To contact the attending provider between 7A-7P or the covering provider during after hours 7P-7A, please log into the web site [email or web address] and access using universal [place] password for that web site. If you do not have the password, please call the hospital operator.  10/22/2019, 10:44 AM

## 2019-10-22 NOTE — TOC Progression Note (Signed)
Transition of Care [facility]) - Progression Note    Patient Details  Name: Trevor Williams MRN: [id number] Date of Birth: Apr 19, 1960  Transition of [facility]) CM/SW Contact  Panayiotis [place], Juliann Pulse, RN Phone Number: 10/22/2019, 9:46 AM  Clinical Narrative:CM referral for med asst-Patient has health insurance-has an obligated co pay for meds.       Expected Discharge Plan: Homeless Shelter Barriers to Discharge: No Barriers Identified  Expected Discharge Plan and Services Expected Discharge Plan: Homeless Shelter   Discharge Planning Services: CM Consult   Living arrangements for the past 2 months: Homeless Shelter                                       Social Determinants of Health (SDOH) Interventions    Readmission Risk Interventions Readmission Risk Prevention Plan 05/01/2019  Transportation Screening Complete  Medication Review [employer]) Complete  PCP or Specialist appointment within 3-5 days of discharge (No Data)  SW Recovery Care/Counseling Consult Complete  Palliative Care Screening Not [place] Complete  Some recent data might be hidden

## 2019-10-23 ENCOUNTER — Encounter (HOSPITAL_COMMUNITY): Payer: Self-pay | Admitting: Family Medicine

## 2019-10-23 LAB — CBC WITH DIFFERENTIAL/PLATELET
Abs Immature Granulocytes: 0.02 10*3/uL (ref 0.00–0.07)
Basophils Absolute: 0.1 10*3/uL (ref 0.0–0.1)
Basophils Relative: 1 %
Eosinophils Absolute: 0.1 10*3/uL (ref 0.0–0.5)
Eosinophils Relative: 2 %
HCT: 27.3 % — ABNORMAL LOW (ref 39.0–52.0)
Hemoglobin: 8.9 g/dL — ABNORMAL LOW (ref 13.0–17.0)
Immature Granulocytes: 0 %
Lymphocytes Relative: 27 %
Lymphs Abs: 1.8 10*3/uL (ref 0.7–4.0)
MCH: 35.6 pg — ABNORMAL HIGH (ref 26.0–34.0)
MCHC: 32.6 g/dL (ref 30.0–36.0)
MCV: 109.2 fL — ABNORMAL HIGH (ref 80.0–100.0)
Monocytes Absolute: 0.9 10*3/uL (ref 0.1–1.0)
Monocytes Relative: 14 %
Neutro Abs: 3.7 10*3/uL (ref 1.7–7.7)
Neutrophils Relative %: 56 %
Platelets: 140 10*3/uL — ABNORMAL LOW (ref 150–400)
RBC: 2.5 MIL/uL — ABNORMAL LOW (ref 4.22–5.81)
RDW: 15.8 % — ABNORMAL HIGH (ref 11.5–15.5)
WBC: 6.6 10*3/uL (ref 4.0–10.5)
nRBC: 0 % (ref 0.0–0.2)

## 2019-10-23 LAB — BASIC METABOLIC PANEL
Anion gap: 5 (ref 5–15)
BUN: 6 mg/dL (ref 6–20)
CO2: 22 mmol/L (ref 22–32)
Calcium: 8.1 mg/dL — ABNORMAL LOW (ref 8.9–10.3)
Chloride: 104 mmol/L (ref 98–111)
Creatinine, Ser: 0.64 mg/dL (ref 0.61–1.24)
GFR calc Af Amer: >60 mL/min (ref >60)
GFR calc non Af Amer: >60 mL/min (ref >60)
Glucose, Bld: 91 mg/dL (ref 70–99)
Potassium: 3.9 mmol/L (ref 3.5–5.1)
Sodium: 131 mmol/L — ABNORMAL LOW (ref 135–145)

## 2019-10-23 LAB — TYPE AND SCREEN
ABO/RH(D): A POS
Antibody Screen: NEGATIVE
Unit division: 0

## 2019-10-23 LAB — BPAM RBC
Blood Product Expiration Date: 202108232359
ISSUE DATE / TIME: 202108040038
Unit Type and Rh: 6200

## 2019-10-23 MED ORDER — MECLIZINE HCL 25 MG PO TABS: 25.0000 mg | ORAL_TABLET | Freq: Three times a day (TID) | ORAL | Status: DC | PRN

## 2019-10-23 MED ORDER — THIAMINE HCL 100 MG PO TABS
100.0000 mg | ORAL_TABLET | Freq: Every day | ORAL | Status: DC
  Administered 2019-10-24: 100 mg via ORAL
  Filled 2019-10-23: qty 1

## 2019-10-23 MED ORDER — HYDROXYZINE HCL 25 MG PO TABS: 25.0000 mg | ORAL_TABLET | Freq: Three times a day (TID) | ORAL | Status: DC | PRN

## 2019-10-23 NOTE — Progress Notes (Signed)
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  Initially concern for coag negative staph bacteremia, but noted to be contaminant.  Doxycycline was started for tick exposure and concern for tick borne illness, though now d/c'd by ID.  Last fever was 8/2 AM.  Was hoping to discharge soon, but he's had continued lightheadedness and dizziness.    Transfuse 1 unit packed red blood cells.  Given drop in Hb from 11 to 8, follow hemoccult and GI consulted for further evaluation.    Assessment & Plan:   Principal Problem:   Positive blood culture Active Problems:   Alcohol use   Homeless   Hypokalemia   Tobacco use disorder   Chronic systolic heart failure (HCC)   Elevated LFTs   Hypomagnesemia   Dehydration   Dizziness   COPD (chronic obstructive pulmonary disease) (HCC)   Macrocytic anemia   AKI (acute kidney injury) ([place])  1 lightheadedness/dizziness likely secondary to orthostasis Patient noted with complaints of lightheadedness and dizziness.  Orthostatics checked on 10/21/2019 were positive.  Patient noted to have had some lightheadedness and difficulty with ambulation on 10/21/2019 and noted on ambulation to go with lightheadedness and described as shaky on his feet with walker.  Head CT/MRI brain done negative for any acute abnormalities.  Patient status post transfusion of 1 unit packed red blood cells.  Patient with no overt bleeding.  Place TED hose.  Repeat orthostatics.  If patient is orthostatic we will increase IV fluids to  100 cc/h.  Supportive care.  Follow.  2.  Iron deficiency anemia Patient with no overt GI bleed.  Patient noted to have a drop in hemoglobin from 11.2 on 10/14/2019 down to 7.4 on 10/20/2019.  Patient transfused a unit packed red blood cells.  Hemoglobin currently at 8.9.  Due to drop in hemoglobin, prior history of sessile polyps, gastritis and duodenitis(per EGD and colonoscopy 01/28/2019) GI consulted who feel anemia likely dilutional in nature and do not recommend any invasive GI testing at this time and recommending transfusion as needed and to follow-up with GI in the outpatient setting in the next several weeks.  Transfusion threshold hemoglobin less than 7.  Continue PPI.  Continue oral iron supplementation.  Follow H&H.  3.  Dehydration IV fluids.  4.  Coagulase-negative staph bacteremia Noted in 3/4 bottles concerning for the bacteremia despite coagulase-negative.  Repeat blood cultures obtained with no growth to date.  Dr. Florene Glen discussed with microbiology, ID consulted who recommended discontinuation of IV antibiotics and to monitor off IV antibiotics.  Repeat blood cultures with no growth to date.  Patient afebrile since 10/20/2019.  Supportive care.  5.  Concern for tickborne illness Patient noted to have pulled out for ticks this summer.  Initially started on doxycycline which was subsequently discontinued.  ID recommending to monitor off doxycycline.  6.  Tinea corporis Patient noted to have confluent erythematous plaque on back and abdomen with overlying scale more defined/raised border.  Felt likely fungal in nature.  Patient initially ordered fluconazole daily however changed to once weekly x2 weeks with 1 more  dose due on 10/25/2019.  Follow.  7.  Chronic systolic heart failure Currently stable.  Patient with no overt fluid overload.  2D echo with a EF of 30% from 04/2019.  Strict I's and O's.  Daily weights.  Monitor with hydration.  8.  Pyuria In the setting of chills.  Patient  currently afebrile.  No leukocytosis.  Blood cultures (10/16/2019) with no growth to date.  Urine cultures with multiple species present.  Follow.  9.  Acute kidney injury Baseline creatinine less than 1.  Presentation patient noted to have a creatinine of 1.57.  Renal ultrasound negative for hydronephrosis.  Patient hydrated with IV fluids with improvement with acute kidney injury.  Creatinine currently at 0.57.  Patient on gentle hydration due to orthostasis.  Follow.  10.  Alcohol abuse Continue the Ativan withdrawal protocol, thiamine, folic acid, multivitamin.  11.  Transaminitis Secondary to alcohol abuse.  Right upper quadrant ultrasound notable for cholelithiasis without cholecystitis and also notable for steatosis.  HIV negative.  Acute hepatitis panel negative.  Outpatient follow-up.  12.  Hypokalemia Repleted.  13.  Hypomagnesemia Repleted.  14.  Tobacco abuse Tobacco cessation stressed to patient.  Nicotine patch offered to patient however patient refusing at this time.  Follow.   15.  Chronic frontotemporal encephalomalacia Stable.  Outpatient follow-up.    DVT prophylaxis: SCDs Code Status: Full Family Communication: Updated patient.  No family at bedside. Disposition:   Status is: Inpatient    Dispo: The patient is from: Homeless              Anticipated d/c is to: To be determined              Anticipated d/c date is: Hopefully 1 to 2 days.              Patient currently with complaints of lightheadedness and dizziness on standing with ambulation, PT vestibular evaluation pending.  Currently not stable for discharge.        Consultants:   Gastroenterology: Dr. Ardis Hughs 10/22/2019  ID: Dr. Megan Salon 10/16/2019  Procedures:  CT head 10/14/2019  Chest x-ray 10/15/2019, 10/20/2019  MRI brain 10/14/2019  Renal ultrasound 10/15/2019  Right upper quadrant ultrasound 10/15/2019  2D echo 10/15/2019  Transfusion 1 unit packed red blood cells  10/22/2019  Antimicrobials:   IV daptomycin 10/16/2018>>>> 10/18/2019     Subjective: Patient sitting up in bed.  Feels dizziness is slowly improving however states when he stands up to ambulate still feeling dizzy and unsteady to the point where he feels he may pass out and has to sit down.  Denies any chest pain or shortness of breath.  Cannot quite describe dizzines.  Objective: Vitals:   10/22/19 2032 10/23/19 0552 10/23/19 0654 10/23/19 0909  BP: 130/85 (!) 162/83  116/87  Pulse: (!) 105 92    Resp: 17 (!) 22    Temp: 98.8 F (37.1 C) 98.3 F (36.8 C)    TempSrc: Oral Oral    SpO2: 98% 99%    Weight:   56 kg   Height:        Intake/Output Summary (Last 24 hours) at 10/23/2019 1143 Last data filed at 10/23/2019 0500 Gross per 24 hour  Intake 950.02 ml  Output 800 ml  Net 150.02 ml   Filed Weights   10/20/19 0604 10/22/19 0617 10/23/19 0654  Weight: 52.9 kg 59.9 kg 56 kg    Examination:  General exam: NAD.  Respiratory system: CTAB.  No wheezes, no  crackles, no rhonchi.  Normal respiratory effort.  Cardiovascular system: RRR no murmurs rubs or gallops.  No JVD.  No lower extremity edema.  Gastrointestinal system: Abdomen is soft, nontender, nondistended, positive bowel sounds.  No rebound.  No guarding.  Central nervous system: Alert and oriented. No focal neurological deficits. Extremities: Symmetric 5 x 5 power. Skin: No rashes, lesions or ulcers Psychiatry: Judgement and insight appear normal. Mood & affect appropriate.     Data Reviewed: I have personally reviewed following labs and imaging studies  CBC: Recent Labs  Lab 10/19/19 0607 10/19/19 0607 10/20/19 0522 10/20/19 1355 10/21/19 0550 10/22/19 0550 10/23/19 0526  WBC 5.7  --  4.6  --  4.9 5.7 6.6  NEUTROABS 3.4  --  2.5  --  3.2 3.0 3.7  HGB 8.7*   < > 7.4* 8.3* 8.1* 8.9* 8.9*  HCT 26.4*   < > 22.8* 25.5* 24.6* 27.3* 27.3*  MCV 114.8*  --  114.6*  --  112.3* 109.2* 109.2*  PLT 101*  --  98*  --   111* 123* 140*   < > = values in this interval not displayed.    Basic Metabolic Panel: Recent Labs  Lab 10/18/19 0604 10/18/19 0604 10/19/19 8546 10/20/19 0522 10/21/19 0550 10/22/19 0550 10/23/19 0526  NA 138   < > 136 135 132* 133* 131*  K 3.6   < > 3.7 4.2 4.3 4.0 3.9  CL 104   < > 104 103 101 103 104  CO2 26   < > 25 25 25 22 22   GLUCOSE 93   < > 87 82 83 93 91  BUN 9   < > 7 8 8 6 6   CREATININE 0.55*   < > 0.57* 0.61 0.63 0.57* 0.64  CALCIUM 8.4*   < > 8.3* 8.0* 8.1* 8.2* 8.1*  MG 1.4*  --  1.5* 1.5* 1.4* 2.1  --   PHOS 2.4*  --  3.1 3.2 3.5 3.9  --    < > = values in this interval not displayed.    GFR: Estimated Creatinine Clearance: 79.7 mL/min (by C-G formula based on SCr of 0.64 mg/dL).  Liver Function Tests: Recent Labs  Lab 10/18/19 0604 10/19/19 0607 10/20/19 0522 10/21/19 0550 10/22/19 0550  AST 90* 80* 83* 79* 70*  ALT 40 37 35 37 36  ALKPHOS 87 85 84 79 84  BILITOT 1.0 1.1 1.0 1.0 1.5*  PROT 5.8* 5.9* 5.5* 5.3* 5.2*  ALBUMIN 3.0* 2.8* 2.7* 2.5* 2.5*    CBG: No results for input(s): GLUCAP in the last 168 hours.   Recent Results (from the past 240 hour(s))  SARS Coronavirus 2 by RT PCR (hospital order, performed in [facility] hospital lab) Nasopharyngeal Nasopharyngeal Swab     Status: None   Collection Time: 10/15/19 12:22 AM   Specimen: Nasopharyngeal Swab  Result Value Ref Range Status   SARS Coronavirus 2 NEGATIVE NEGATIVE Final    Comment: (NOTE) SARS-CoV-2 target nucleic acids are NOT DETECTED.  The SARS-CoV-2 RNA is generally detectable in upper and lower respiratory specimens during the acute phase of infection. The lowest concentration of SARS-CoV-2 viral copies this assay can detect is 250 copies / mL. A negative result does not preclude SARS-CoV-2 infection and should not be used as the sole basis for treatment or other patient management decisions.  A negative result may occur with improper specimen collection / handling,  submission of specimen other than nasopharyngeal swab, presence of viral mutation(s)  within the areas targeted by this assay, and inadequate number of viral copies (<250 copies / mL). A negative result must be combined with clinical observations, patient history, and epidemiological information.  Fact Sheet for Patients:   StrictlyIdeas.no  Fact Sheet for Healthcare Providers: BankingDealers.co.za  This test is not yet approved or  cleared by the [place] FDA and has been authorized for detection and/or diagnosis of SARS-CoV-2 by FDA under an Emergency Use Authorization (EUA).  This EUA will remain in effect (meaning this test can be used) for the duration of the COVID-19 declaration under Section 564(b)(1) of the Act, 21 U.S.C. section 360bbb-3(b)(1), unless the authorization is terminated or revoked sooner.  Performed at [facility], [place] [address] [place], [place] [address]   Blood culture (routine x 2)     Status: Abnormal   Collection Time: 10/15/19 12:45 AM   Specimen: BLOOD  Result Value Ref Range Status   Specimen Description   Final    BLOOD BLOOD RIGHT ARM Performed at [facility] [address] [place], [place] [address]    Special Requests   Final    BOTTLES DRAWN AEROBIC AND ANAEROBIC Blood Culture results may not be optimal due to an inadequate volume of blood received in culture bottles Performed at [facility] [address], [place], [place] [address]    Culture  Setup Time   Final    GRAM POSITIVE COCCI IN CLUSTERS IN BOTH AEROBIC AND ANAEROBIC BOTTLES CRITICAL VALUE NOTED.  VALUE IS CONSISTENT WITH PREVIOUSLY REPORTED AND CALLED VALUE.    Culture (A)  Final    STAPHYLOCOCCUS HOMINIS SUSCEPTIBILITIES PERFORMED ON PREVIOUS CULTURE WITHIN THE LAST 5 DAYS. Performed at [facility], [place] [address], [place], [place] [address]    Report  Status 10/18/2019 FINAL  Final  Blood culture (routine x 2)     Status: Abnormal   Collection Time: 10/15/19 12:45 AM   Specimen: BLOOD  Result Value Ref Range Status   Specimen Description   Final    BLOOD BLOOD LEFT FOREARM Performed at [facility] [address] [place], [place] [address]    Special Requests   Final    BOTTLES DRAWN AEROBIC AND ANAEROBIC Blood Culture adequate volume Performed at [facility] [address], [place], [place] [address]    Culture  Setup Time   Final    GRAM POSITIVE COCCI IN CLUSTERS AEROBIC BOTTLE ONLY Organism ID to follow CRITICAL RESULT CALLED TO, READ BACK BY AND VERIFIED WITH: Elenore Paddy PHARMD [id number] 10/15/19 A BROWNING Performed at [facility], [place] [address], [place],  [address]    Culture STAPHYLOCOCCUS HOMINIS (A)  Final   Report Status 10/17/2019 FINAL  Final   Organism ID, Bacteria STAPHYLOCOCCUS HOMINIS  Final      Susceptibility   Staphylococcus hominis - MIC*    CIPROFLOXACIN <=0.5 SENSITIVE Sensitive     ERYTHROMYCIN INTERMEDIATE Intermediate     GENTAMICIN <=0.5 SENSITIVE Sensitive     OXACILLIN <=0.25 SENSITIVE Sensitive     TETRACYCLINE <=1 SENSITIVE Sensitive     VANCOMYCIN <=0.5 SENSITIVE Sensitive     TRIMETH/SULFA <=10 SENSITIVE Sensitive     CLINDAMYCIN >=8 RESISTANT Resistant     RIFAMPIN <=0.5 SENSITIVE Sensitive     Inducible Clindamycin NEGATIVE Sensitive     * STAPHYLOCOCCUS HOMINIS  Blood Culture ID Panel (Reflexed)     Status: Abnormal   Collection Time:  10/15/19 12:45 AM  Result Value Ref Range Status   Enterococcus species NOT DETECTED NOT DETECTED Final   Listeria monocytogenes NOT DETECTED NOT DETECTED Final   Staphylococcus species DETECTED (A) NOT DETECTED Final    Comment: Methicillin (oxacillin) resistant coagulase negative staphylococcus. Possible blood culture contaminant (unless isolated from more than one blood culture draw or clinical case  suggests pathogenicity). No antibiotic treatment is indicated for blood  culture contaminants. CRITICAL RESULT CALLED TO, READ BACK BY AND VERIFIED WITH: L POINDEXTER PHARMD [id number] 10/15/19 A BROWNING    Staphylococcus aureus (BCID) NOT DETECTED NOT DETECTED Final   Methicillin resistance DETECTED (A) NOT DETECTED Final    Comment: CRITICAL RESULT CALLED TO, READ BACK BY AND VERIFIED WITH: L POINDEXTER PHARMD 2328 10/15/19 A BROWNING    Streptococcus species NOT DETECTED NOT DETECTED Final   Streptococcus agalactiae NOT DETECTED NOT DETECTED Final   Streptococcus pneumoniae NOT DETECTED NOT DETECTED Final   Streptococcus pyogenes NOT DETECTED NOT DETECTED Final   Acinetobacter baumannii NOT DETECTED NOT DETECTED Final   Enterobacteriaceae species NOT DETECTED NOT DETECTED Final   Enterobacter cloacae complex NOT DETECTED NOT DETECTED Final   Escherichia coli NOT DETECTED NOT DETECTED Final   Klebsiella oxytoca NOT DETECTED NOT DETECTED Final   Klebsiella pneumoniae NOT DETECTED NOT DETECTED Final   Proteus species NOT DETECTED NOT DETECTED Final   Serratia marcescens NOT DETECTED NOT DETECTED Final   Haemophilus influenzae NOT DETECTED NOT DETECTED Final   Neisseria meningitidis NOT DETECTED NOT DETECTED Final   Pseudomonas aeruginosa NOT DETECTED NOT DETECTED Final   Candida albicans NOT DETECTED NOT DETECTED Final   Candida glabrata NOT DETECTED NOT DETECTED Final   Candida krusei NOT DETECTED NOT DETECTED Final   Candida parapsilosis NOT DETECTED NOT DETECTED Final   Candida tropicalis NOT DETECTED NOT DETECTED Final    Comment: Performed at [facility], [place]. [address], [place], [place] [address]  Culture, Urine     Status: Abnormal   Collection Time: 10/15/19  2:15 AM   Specimen: Urine, Clean Catch  Result Value Ref Range Status   Specimen Description   Final    URINE, CLEAN CATCH Performed at [facility], [place] [address], [place], [place] [address]     Special Requests   Final    NONE Performed at [facility], [place] [address] [place], [place] [address]    Culture MULTIPLE SPECIES PRESENT, SUGGEST RECOLLECTION (A)  Final   Report Status 10/16/2019 FINAL  Final  Culture, blood (routine x 2)     Status: None   Collection Time: 10/16/19  8:55 AM   Specimen: BLOOD LEFT HAND  Result Value Ref Range Status   Specimen Description   Final    BLOOD LEFT HAND Performed at [facility] [address], [place], [place] [address]    Special Requests   Final    BOTTLES DRAWN AEROBIC ONLY Blood Culture adequate volume Performed at [facility] [address] [place], [place] [address]    Culture   Final    NO GROWTH 5 DAYS Performed at [facility], [place] [address], [place],  [address]    Report Status 10/21/2019 FINAL  Final  Culture, blood (routine x 2)     Status: None   Collection Time: 10/16/19  9:28 AM   Specimen: BLOOD  Result Value Ref Range Status   Specimen Description   Final    BLOOD  RIGHT ARM Performed at [facility], [place] [address], [place], [place] [address]    Special Requests   Final    BOTTLES DRAWN AEROBIC ONLY Blood Culture adequate volume Performed at [facility] [address], [place], [place] [address]    Culture   Final    NO GROWTH 5 DAYS Performed at [facility], [place] [address], [place], [place] [address]    Report Status 10/21/2019 FINAL  Final         Radiology Studies: No results found.      Scheduled Meds:  sodium chloride   Intravenous Once   docusate sodium  100 mg Oral BID   feeding supplement  1 Container Oral Q24H   feeding supplement (KATE FARMS STANDARD 1.4)  325 mL Oral Daily   ferrous sulfate  325 mg Oral Q breakfast   [START ON 10/25/2019] fluconazole  150 mg Oral Once   folic acid  1 mg Oral Daily   multivitamin with minerals  1 tablet Oral Daily    nicotine  21 mg Transdermal Daily   pantoprazole  40 mg Oral BID AC   sodium chloride flush  3 mL Intravenous Q12H   [START ON 10/24/2019] thiamine  100 mg Oral Daily   traZODone  100 mg Oral QHS   Continuous Infusions:    LOS: 8 days    Time spent: 35 minutes    Irine Seal, MD [facility]   To contact the attending provider between 7A-7P or the covering provider during after hours 7P-7A, please log into the web site [email or web address] and access using universal [place] password for that web site. If you do not have the password, please call the hospital operator.  10/23/2019, 11:43 AM

## 2019-10-23 NOTE — TOC Transition Note (Signed)
Transition of Care [facility]) - CM/SW Discharge Note   Patient Details  Name: Trevor Williams MRN: [id number] Date of Birth: [birth year]  [facility]) CM/SW Contact:  Dessa Phi, RN Phone Number: 10/23/2019, 9:13 AM   Clinical Narrative: Patient has pcp, pharmacy,has health insurance w/obligated co pay-patient states he can afford his co pay. Declines etoh/tobacco resources;he lives in a tent(home) will d/c back to tent-provided bus pass(in shadow chart) Nsg aware. No further CM needs.     Final next level of care: Homeless Shelter Barriers to Discharge: No Barriers Identified   Patient Goals and CMS Choice Patient states their goals for this hospitalization and ongoing recovery are:: return back to tent CMS Medicare.gov Compare Post Acute Care list provided to:: Patient Choice offered to / list presented to : Patient  Discharge Placement                       Discharge Plan and Services   Discharge Planning Services: CM Consult                                 Social Determinants of Health (SDOH) Interventions     Readmission Risk Interventions Readmission Risk Prevention Plan 05/01/2019  Transportation Screening Complete  Medication Review Press photographer) Complete  PCP or Specialist appointment within 3-5 days of discharge (No Data)  SW Recovery Care/Counseling Consult Complete  Palliative Care Screening Not [place] Complete  Some recent data might be hidden

## 2019-10-23 NOTE — Evaluation (Signed)
Physical Therapy Re-Evaluation Patient Details Name: Trevor Williams MRN: [id number] DOB: 02/27/1961 Today's Date: 10/23/2019   History of Present Illness  Trevor Williams is a 59 y.o. 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    "Initially concern for coag negative staph bacteremia, but noted to be contaminant. Doxycycline was started for tick exposure and concern for tick borne illness, though now d/c'd by ID. Last fever was 8/2 AM. Was hoping to discharge soon, but he's had continued lightheadedness and dizziness.   Transfuse 1 unit packed red blood cells.  Given drop in Hb from 11 to 8, follow hemoccult and GI consulted for further evaluation" - Narrative per Dr. Grandville Silos on 10/22/19   Clinical Impression  Patient seen for follow up PT re-evaluation this date. He has been c/o ongoing dizziness with moving around. No nystagmus observed with vestibular testing and no symptom provocation with occulomotor testing or cervical ROM testing. Patient reported some "wozziness" with sit to stand but this sensation seemed to come and go after standing for ~30 seconds and during ambulation. BP stable with no orthostatic drop. He was educated again on benefits of mobilizing with RN/NT staff and educated on benefits of OPPT follow up for balance and strengthening as he c/o being weak in the legs. Acute PT will follow during remaining stay to continue to assess and progress mobility as able.    Follow Up Recommendations Outpatient PT (for balance training)    Equipment Recommendations  None recommended by PT    Recommendations for Other Services       Precautions / Restrictions Precautions Precautions: Fall Precaution Comments: probably fallen in last 6  months Restrictions Weight Bearing Restrictions: No       Vestibular Assessment - 10/23/19 0001      Symptom Behavior   Type of Dizziness  --   wooziness   Symptom Nature Intermittent;Positional   sit to stand   Aggravating Factors Sit to stand    Relieving Factors Comments   time, it comes and goes   Progression of Symptoms Better   pt reports seems better today than yesterday     Oculomotor Exam   Oculomotor Alignment Normal    Ocular ROM WFL    Spontaneous Absent    Gaze-induced  Absent    Head shaking Horizontal Absent    Smooth Pursuits Intact    Saccades Intact    Comment No nystagmus noted with patient and no symptoms provocked with testing.       Orthostatics   BP sitting 126/83    HR sitting 99    BP standing (after 1 minute) 121/78    HR standing (after 1 minute) 102    BP standing (after 3 minutes) 115/75   after gait   HR standing (after 3 minutes) 111            Mobility  Bed Mobility Overal bed mobility: Modified Independent      General bed mobility comments: HOB elevated  Transfers Overall transfer level: Needs assistance Equipment used: Straight cane Transfers: Sit to/from Stand Sit to Stand: Supervision         General transfer comment: pt steady to rise from EOB 3x with no device and with [facility]  Ambulation/Gait Ambulation/Gait assistance: Min guard;Supervision Gait Distance (Feet): 100 Feet (1x20; 1x80)  Assistive device: Straight cane Gait Pattern/deviations: Step-through pattern Gait velocity: fair   General Gait Details: pt with noted tremors compared to evaluation performed on 10/17/19. No overt LOB noted during gait. HR reached max of 115 bpm during gait.  Stairs            Wheelchair Mobility    Modified Rankin (Stroke Patients Only)       Balance Overall balance assessment: Needs assistance Sitting-balance support: Feet supported Sitting balance-Leahy Scale: Good     Standing balance support: During functional  activity;Single extremity supported;No upper extremity supported Standing balance-Leahy Scale: Good   Single Leg Stance - Right Leg: 0 Single Leg Stance - Left Leg: 0                         Pertinent Vitals/Pain Pain Assessment: No/denies pain    Home Living Family/patient expects to be discharged to:: Shelter/Homeless                 Additional Comments: Pt reports [place]. He has a SPC to get around and uses food stamps to grocery shop.     Prior Function Level of Independence: Independent         Comments: pt reports he gets fatigued quickly and has to walk      Hand Dominance   Dominant Hand: Left    Extremity/Trunk Assessment   Upper Extremity Assessment Upper Extremity Assessment: Overall WFL for tasks assessed    Lower Extremity Assessment Lower Extremity Assessment: Generalized weakness    Cervical / Trunk Assessment Cervical / Trunk Assessment: Normal  Communication   Communication: No difficulties  Cognition Arousal/Alertness: Awake/alert Behavior During Therapy: WFL for tasks assessed/performed Overall Cognitive Status: Within Functional Limits for tasks assessed             General Comments      Exercises     Assessment/Plan    PT Assessment Patient needs continued PT services  PT Problem List Decreased strength;Decreased activity tolerance;Decreased balance;Decreased mobility;Decreased knowledge of use of DME;Decreased knowledge of precautions       PT Treatment Interventions DME instruction;Gait training;Stair training;Functional mobility training;Therapeutic activities;Therapeutic exercise;Balance training;Patient/family education    PT Goals (Current goals can be found in the Care Plan section)  Acute Rehab PT Goals Patient Stated Goal: to get better and back home PT Goal Formulation: With patient Time For Goal Achievement: 10/31/19 Potential to Achieve Goals: Good    Frequency Min  3X/week    AM-PAC PT "6 Clicks" Mobility  Outcome Measure Help needed turning from your back to your side while in a flat bed without using bedrails?: None Help needed moving from lying on your back to sitting on the side of a flat bed without using bedrails?: None Help needed moving to and from a bed to a chair (including a wheelchair)?: None Help needed standing up from a chair using your arms (e.g., wheelchair or bedside chair)?: None Help needed to walk in hospital room?: A Little Help needed climbing 3-5 steps with a railing? : A Little 6 Click Score: 22    End of Session Equipment Utilized During Treatment: Gait belt Activity Tolerance: Patient tolerated treatment well Patient left: in bed;with bed alarm set;with call bell/phone within reach Nurse Communication: Mobility status PT Visit Diagnosis: Muscle weakness (generalized) (M62.81);Other abnormalities of gait and mobility (R26.89)    Time: 4268-3419 PT Time Calculation (min) (ACUTE ONLY): 26 min  Charges:   PT Evaluation $PT Re-evaluation: 1 Re-eval PT Treatments $Physical Performance Test: 8-22 mins        Verner Mould, DPT Acute Rehabilitation Services  Office [phone] Pager [phone]  10/23/2019 5:17 PM

## 2019-10-24 DIAGNOSIS — R531 Weakness: Secondary | ICD-10-CM

## 2019-10-24 DIAGNOSIS — J449 Chronic obstructive pulmonary disease, unspecified: Secondary | ICD-10-CM

## 2019-10-24 LAB — HEMOGLOBIN AND HEMATOCRIT, BLOOD
HCT: 30.1 % — ABNORMAL LOW (ref 39.0–52.0)
Hemoglobin: 9.7 g/dL — ABNORMAL LOW (ref 13.0–17.0)

## 2019-10-24 LAB — BASIC METABOLIC PANEL
Anion gap: 9 (ref 5–15)
BUN: 5 mg/dL — ABNORMAL LOW (ref 6–20)
CO2: 23 mmol/L (ref 22–32)
Calcium: 8.3 mg/dL — ABNORMAL LOW (ref 8.9–10.3)
Chloride: 104 mmol/L (ref 98–111)
Creatinine, Ser: 0.65 mg/dL (ref 0.61–1.24)
GFR calc Af Amer: >60 mL/min (ref >60)
GFR calc non Af Amer: >60 mL/min (ref >60)
Glucose, Bld: 92 mg/dL (ref 70–99)
Potassium: 3.9 mmol/L (ref 3.5–5.1)
Sodium: 136 mmol/L (ref 135–145)

## 2019-10-24 MED ORDER — THIAMINE HCL 100 MG PO TABS: 100.0000 mg | ORAL_TABLET | Freq: Every day | ORAL | 1 refills | Status: AC

## 2019-10-24 MED ORDER — ADULT MULTIVITAMIN W/MINERALS CH: 1.0000 | ORAL_TABLET | Freq: Every day | ORAL | Status: AC

## 2019-10-24 MED ORDER — FOLIC ACID 1 MG PO TABS: 1.0000 mg | ORAL_TABLET | Freq: Every day | ORAL | 1 refills | Status: AC

## 2019-10-24 MED ORDER — SACUBITRIL-VALSARTAN 24-26 MG PO TABS: 1.0000 | ORAL_TABLET | Freq: Two times a day (BID) | ORAL | 1 refills | Status: AC

## 2019-10-24 MED ORDER — NICOTINE 21 MG/24HR TD PT24: 21.0000 mg | MEDICATED_PATCH | Freq: Every day | TRANSDERMAL | 0 refills | Status: AC

## 2019-10-24 MED ORDER — FERROUS SULFATE 325 (65 FE) MG PO TABS: 325.0000 mg | ORAL_TABLET | Freq: Every day | ORAL | 1 refills | Status: AC

## 2019-10-24 MED ORDER — DOCUSATE SODIUM 100 MG PO CAPS: 100.0000 mg | ORAL_CAPSULE | Freq: Two times a day (BID) | ORAL | 0 refills | Status: AC

## 2019-10-24 MED ORDER — ALBUTEROL SULFATE HFA 108 (90 BASE) MCG/ACT IN AERS: 2.0000 | INHALATION_SPRAY | RESPIRATORY_TRACT | 0 refills | Status: AC | PRN

## 2019-10-24 MED ORDER — PANTOPRAZOLE SODIUM 40 MG PO TBEC: 40.0000 mg | DELAYED_RELEASE_TABLET | Freq: Two times a day (BID) | ORAL | 1 refills | Status: AC

## 2019-10-24 MED ORDER — CARVEDILOL 3.125 MG PO TABS: 3.1250 mg | ORAL_TABLET | Freq: Two times a day (BID) | ORAL | 1 refills | Status: AC

## 2019-10-24 MED ORDER — ROSUVASTATIN CALCIUM 10 MG PO TABS: 10.0000 mg | ORAL_TABLET | Freq: Every day | ORAL | 1 refills | Status: AC

## 2019-10-24 MED ORDER — HYDROXYZINE HCL 25 MG PO TABS: 25.0000 mg | ORAL_TABLET | Freq: Three times a day (TID) | ORAL | 0 refills | Status: AC | PRN

## 2019-10-24 MED ORDER — TRAZODONE HCL 100 MG PO TABS: 100.0000 mg | ORAL_TABLET | Freq: Every evening | ORAL | 0 refills | Status: AC | PRN

## 2019-10-24 MED FILL — ROSUVASTATIN CALCIUM 10 MG: 10 | 30 days supply | Qty: 30 | Fill #0

## 2019-10-24 MED FILL — traZODone HCL 100 MG TABS: 100 | 20 days supply | Qty: 20 | Fill #0

## 2019-10-24 MED FILL — NICOTINE 21 MG/24HR PATCH: 21 | 28 days supply | Qty: 28 | Fill #0

## 2019-10-24 MED FILL — ENTRESTO 24 MG-26 MG TABLET: 24-26 | 30 days supply | Qty: 60 | Fill #0

## 2019-10-24 MED FILL — PANTOPRAZOLE SOD DR 40 MG T: 40 | 30 days supply | Qty: 60 | Fill #0

## 2019-10-24 MED FILL — CARVEDILOL 3.125 MG TABLET: 3.125 | 30 days supply | Qty: 60 | Fill #0

## 2019-10-24 MED FILL — HYDROXYZINE HCL 25 MG TABS: 25 | 7 days supply | Qty: 20 | Fill #0

## 2019-10-24 MED FILL — PROAIR HFA 90 MCG INHALER: 108 (90 BAS | 16 days supply | Qty: 9 | Fill #0

## 2019-10-24 MED FILL — FOLIC ACID 1 MG TABS: 1 | 30 days supply | Qty: 30 | Fill #0

## 2019-10-24 NOTE — TOC Transition Note (Signed)
Transition of Care [facility]) - CM/SW Discharge Note   Patient Details  Name: Trevor Williams MRN: [id number] Date of Birth: [birth year]  Transition of Care [facility]) CM/SW Contact:  Dessa Phi, RN Phone Number: 10/24/2019, 9:39 AM   Clinical Narrative: Patient's referral for otpt neuro rehab set up-patient will call for his own appt(will have access to phone)patient also agrees to bus pass instead of taxi transportation-Nsg aware.No further CM needs.      Final next level of care: Homeless Shelter Barriers to Discharge: No Barriers Identified   Patient Goals and CMS Choice Patient states their goals for this hospitalization and ongoing recovery are:: return back to tent CMS Medicare.gov Compare Post Acute Care list provided to:: Patient Choice offered to / list presented to : Patient  Discharge Placement                       Discharge Plan and Services   Discharge Planning Services: CM Consult                                 Social Determinants of Health (SDOH) Interventions     Readmission Risk Interventions Readmission Risk Prevention Plan 05/01/2019  Transportation Screening Complete  Medication Review Press photographer) Complete  PCP or Specialist appointment within 3-5 days of discharge (No Data)  SW Recovery Care/Counseling Consult Complete  Palliative Care Screening [place] Complete  Some recent data might be hidden

## 2019-10-24 NOTE — Discharge Summary (Signed)
Physician Discharge Summary  Trevor Williams GYI:[id number] DOB: [birth year] DOA: 10/14/2019  PCP: Elsie Stain, MD  Admit date: 10/14/2019 Discharge date: 10/24/2019  Time spent: 50 minutes  Recommendations for Outpatient Follow-up:  1. Follow-up with [facility] on 11/06/2019.  On follow-up patient will need a basic metabolic profile done to follow-up on electrolytes and renal function. 2. Follow-up in outpatient PT.   Discharge Diagnoses:  Principal Problem:   Positive blood culture Active Problems:   Alcohol use   Homeless   Hypokalemia   Tobacco use disorder   Chronic systolic heart failure (HCC)   Elevated LFTs   Hypomagnesemia   Dehydration   Dizziness   COPD (chronic obstructive pulmonary disease) (HCC)   Macrocytic anemia   AKI (acute kidney injury) ([place])   Discharge Condition: Stable and improved  Diet recommendation: Heart healthy  Filed Weights   10/22/19 0617 10/23/19 0654 10/24/19 0511  Weight: 59.9 kg 56 kg 54.7 kg    History of present illness:  HPI per Dr Merita Norton is a 59 y.o. 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 History of cardiomyopathy history of respiratory failure requiring intubation, COPD, anemia, mild CAD, malnutrition, tobacco abuse  Presented with chills, sweats and diarrhea he is homeless and was found on the side of the road States he had dizziness and hard time walking No CVA per MrI one month of nausea vomiting and diarrhea Last eTOH was this AM  Minimizes his ETOH abuse  He continues to smoke Infectious risk factors:  Reports*hot flashes shortness of breath,  N/V/Diarrhea/abdominal pain,     Has  NOt been vaccinated against COVID    Initial COVID TEST   in house  PCR testing  Pending  Hospital Course:  1 lightheadedness/dizziness likely secondary to orthostasis Patient noted with complaints of lightheadedness and dizziness.  Orthostatics  checked on 10/21/2019 were positive.  Patient noted to have had some lightheadedness and difficulty with ambulation on 10/21/2019 and noted on ambulation to go with lightheadedness and described as shaky on his feet with walker.  Head CT/MRI brain done negative for any acute abnormalities.  Patient status post transfusion of 1 unit packed red blood cells.  Patient with no overt bleeding.    TED hose was placed.  Orthostatics were repeated which were negative after hydration with IV fluids.  PT evaluated the patient which was negative for BPPV.  PT recommended outpatient physical therapy which has been arranged.  Patient will be discharged in stable and improved condition.  2.  Iron deficiency anemia Patient with no overt GI bleed.  Patient noted to have a drop in hemoglobin from 11.2 on 10/14/2019 down to 7.4 on 10/20/2019.  Patient transfused a unit packed red blood cells.  Hemoglobin currently at 9.7.  Due to drop in hemoglobin, prior history of sessile polyps, gastritis and duodenitis(per EGD and colonoscopy 01/28/2019) GI consulted who feel anemia likely dilutional in nature and do not recommend any invasive GI testing at this time and recommended transfusion as needed and to follow-up with GI in the outpatient setting in the next several weeks.  Transfusion threshold hemoglobin < 7.    Patient maintained on a PPI and oral iron supplementation.  Outpatient follow-up.   3.  Dehydration Patient was hydrated with IV fluids.  4.  Coagulase-negative staph bacteremia Noted in 3/4 bottles concerning for the bacteremia despite coagulase-negative.  Repeat blood cultures obtained with no growth to date.  Dr. Florene Glen discussed with microbiology, ID consulted who recommended discontinuation of IV antibiotics and to monitor off IV antibiotics.  Repeat blood cultures with no growth to date.  Patient afebrile since 10/20/2019.  No further antibiotics needed.  Outpatient follow-up.  5.  Concern for tickborne  illness Patient noted to have pulled out for ticks this summer.  Initially started on doxycycline which was subsequently discontinued.  ID recommended to monitor off doxycycline.  Patient remained stable outpatient follow-up.  6.  Tinea corporis Patient noted to have confluent erythematous plaque on back and abdomen with overlying scale more defined/raised border.  Felt likely fungal in nature.  Patient initially ordered fluconazole daily however changed to once weekly x2 weeks with 1 more dose due on 10/25/2019.  Follow.  7.  Chronic systolic heart failure Currently stable.  Patient with no overt fluid overload.  2D echo with a EF of 30% from 04/2019.  Outpatient follow-up.  Patient's cardiac regimen was resumed on discharge and patient given prescriptions as he stated had no medications at home.  Outpatient follow-up with PCP.  8.  Pyuria In the setting of chills.  Patient remained afebrile.  No leukocytosis.  Blood cultures (10/16/2019) with no growth to date.  Urine cultures with multiple species present.  Follow.  9.  Acute kidney injury Baseline creatinine < 1.  Presentation patient noted to have a creatinine of 1.57.  Renal ultrasound negative for hydronephrosis.  Patient hydrated with IV fluids with improvement with acute kidney injury.  Creatinine currently at 0.65.    Improved with hydration.  Outpatient follow-up.   10.  Alcohol abuse Patient maintained on Ativan withdrawal protocol, thiamine, folic acid, multivitamin.  Outpatient follow-up.  11.  Transaminitis Secondary to alcohol abuse. Right upper quadrant ultrasound notable for cholelithiasis without cholecystitis and also notable for steatosis.  HIV negative.  Acute hepatitis panel negative.  Outpatient follow-up.  12.  Hypokalemia Repleted.  13.  Hypomagnesemia Repleted.  14.  Tobacco abuse Tobacco cessation stressed to patient.  Nicotine patch offered to patient however patient refused.  Outpatient follow-up.    15.  Chronic frontotemporal encephalomalacia Stable.  Outpatient follow-up.    Procedures:  CT head 10/14/2019  Chest x-ray 10/15/2019, 10/20/2019  MRI brain 10/14/2019  Renal ultrasound 10/15/2019  Right upper quadrant ultrasound 10/15/2019  2D echo 10/15/2019  Transfusion 1 unit packed red blood cells 10/22/2019   Consultations:  Gastroenterology: Dr. Ardis Hughs 10/22/2019  ID: Dr. Megan Salon 10/16/2019  Discharge Exam: Vitals:   10/23/19 1947 10/24/19 0511  BP: 117/79 119/74  Pulse: (!) 55 65  Resp: 18 (!) 22  Temp: 98.9 F (37.2 C) 98.2 F (36.8 C)  SpO2: 97% 97%    General: NAD Cardiovascular: RRR Respiratory: CTAB  Discharge Instructions   Discharge Instructions    Ambulatory referral to Physical Therapy   Complete by: As directed    Diet - low sodium heart healthy   Complete by: As directed    Increase activity slowly   Complete by: As directed      Allergies as of 10/24/2019      Reactions   Spironolactone    gynecomastia   Doxycycline Rash   Rash noted after administration of vancomycin, doxycycline, and ceftriaxone. Unclear cause of rash.   Ibuprofen Rash   Rocephin [ceftriaxone] Rash   Rash noted after administration of vancomycin, doxycycline, and ceftriaxone. Unclear cause of rash.   Tylenol [acetaminophen] Rash   Vancomycin Rash   Rash noted after administration of vancomycin, doxycycline, and ceftriaxone.  Unclear cause of rash.      Medication List    STOP taking these medications   gabapentin 300 MG capsule Commonly known as: NEURONTIN   HYDROcodone-acetaminophen 5-325 MG tablet Commonly known as: NORCO/VICODIN   meloxicam 7.5 MG tablet Commonly known as: MOBIC     TAKE these medications   albuterol 108 (90 Base) MCG/ACT inhaler Commonly known as: VENTOLIN HFA Inhale 2 puffs into the lungs every 4 (four) hours as needed for wheezing or shortness of breath.   carvedilol 3.125 MG tablet Commonly known as: COREG Take 1 tablet  (3.125 mg total) by mouth 2 (two) times daily with a meal.   docusate sodium 100 MG capsule Commonly known as: COLACE Take 1 capsule (100 mg total) by mouth 2 (two) times daily.   ferrous sulfate 325 (65 FE) MG tablet Take 1 tablet (325 mg total) by mouth daily with breakfast. Start taking on: October 24, 8336   folic acid 1 MG tablet Commonly known as: FOLVITE Take 1 tablet (1 mg total) by mouth daily.   hydrOXYzine 25 MG tablet Commonly known as: ATARAX/VISTARIL Take 1 tablet (25 mg total) by mouth 3 (three) times daily as needed for itching or anxiety.   multivitamin with minerals Tabs tablet Take 1 tablet by mouth daily. Start taking on: October 25, 2019   nicotine 21 mg/24hr patch Commonly known as: NICODERM CQ - dosed in mg/24 hours Place 1 patch (21 mg total) onto the skin daily.   pantoprazole 40 MG tablet Commonly known as: Protonix Take 1 tablet (40 mg total) by mouth 2 (two) times daily before a meal. What changed: when to take this   rosuvastatin 10 MG tablet Commonly known as: CRESTOR Take 1 tablet (10 mg total) by mouth daily at 6 PM.   sacubitril-valsartan 24-26 MG Commonly known as: ENTRESTO Take 1 tablet by mouth 2 (two) times daily.   thiamine 100 MG tablet Take 1 tablet (100 mg total) by mouth daily.   traZODone 100 MG tablet Commonly known as: DESYREL Take 1 tablet (100 mg total) by mouth at bedtime as needed for sleep.      Allergies  Allergen Reactions  . Spironolactone     gynecomastia  . Doxycycline Rash    Rash noted after administration of vancomycin, doxycycline, and ceftriaxone. Unclear cause of rash.  . Ibuprofen Rash  . Rocephin [Ceftriaxone] Rash    Rash noted after administration of vancomycin, doxycycline, and ceftriaxone. Unclear cause of rash.  . Tylenol [Acetaminophen] Rash  . Vancomycin Rash    Rash noted after administration of vancomycin, doxycycline, and ceftriaxone. Unclear cause of rash.    Follow-up Information     [place]. Go on 11/06/2019.   Why: 2:30p;bring photo ID Contact information: [place] [id number] [facility] Follow up.   Specialty: Rehabilitation Why: Patient has no phone tel# to give, therefore he has access to a phone, & agree to call for his appt. Contact information: [address] [place] [id number] [place] [phone]               The results of significant diagnostics from this hospitalization (including imaging, microbiology, ancillary and laboratory) are listed below for reference.    Significant Diagnostic Studies: CT Head Wo Contrast  Result Date: 10/14/2019 CLINICAL DATA:  59 year old male with vertigo. EXAM: CT HEAD WITHOUT CONTRAST TECHNIQUE: Contiguous axial images  were obtained from the base of the skull through the vertex without intravenous contrast. COMPARISON:  Head CT dated 04/28/2019. FINDINGS: Brain: There is mild age-related atrophy and chronic microvascular ischemic changes. 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 There is no acute intracranial hemorrhage. No mass effect or midline shift. No extra-axial fluid collection. Vascular: No hyperdense vessel or unexpected calcification. Skull: Normal. Negative for fracture or focal lesion. Sinuses/Orbits: No acute finding. Other: None IMPRESSION: 1. No acute intracranial pathology. 2. Age-related atrophy and chronic microvascular ischemic changes. 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 Electronically Signed   By: Anner Crete M.D.   On: 10/14/2019 22:30   MR BRAIN WO CONTRAST  Result Date: 10/14/2019 CLINICAL DATA:  60 year old male with vertigo. EXAM: MRI HEAD WITHOUT CONTRAST TECHNIQUE: Multiplanar, multiecho pulse sequences of the brain and surrounding structures were  obtained without intravenous contrast. COMPARISON:  Head and cervical spine CT 04/28/2019. FINDINGS: Brain: No restricted diffusion or evidence of acute infarction. Scattered chronic encephalomalacia in the right MCA territory, most pronounced in the right inferior frontal gyrus and superior temporal gyrus. Stable cerebral volume since February. Stable mild ex vacuo ventricular enlargement. No midline shift, mass effect, evidence of mass lesion, ventriculomegaly, extra-axial collection or acute intracranial hemorrhage. Cervicomedullary junction and pituitary are within normal limits. There is also a small area of cortical encephalomalacia in the left superior temporal gyrus. No definite chronic cerebral blood products. Deep gray nuclei, brainstem and cerebellum remain within normal limits. Vascular: Major intracranial vascular flow voids are preserved. Skull and upper cervical spine: Negative, Visualized bone marrow signal is within normal limits. Sinuses/Orbits: Negative orbits. Paranasal sinuses and mastoids are stable and well pneumatized. Other: Grossly normal visible internal auditory structures. Normal stylomastoid foramina. There is a chronic cyst along the inferior margin of the right pinna (series 10, image 3), stable. IMPRESSION: 1. No acute intracranial abnormality. 2. Chronic frontotemporal cortex encephalomalacia, most pronounced in the right MCA territory, could be post ischemic and/or posttraumatic. Electronically Signed   By: Genevie Ann M.D.   On: 10/14/2019 22:11   US RENAL  Result Date: 10/15/2019 CLINICAL DATA:  59 year old male with acute kidney injury. EXAM: RENAL / URINARY TRACT ULTRASOUND COMPLETE COMPARISON:  Right upper quadrant ultrasound earlier today. [facility] CT Abdomen and Pelvis 01/03/2015 FINDINGS: Right Kidney: Renal measurements: 12.5 x 4.0 x 4.3 cm = volume: 111 mL . Echogenicity within normal limits. No mass or hydronephrosis visualized. Left Kidney: Renal measurements:  11.6 x 5.0 x 5.5 cm = volume: 168 mL. Negative ultrasound appearance of the left kidney aside from evidence of trace pararenal fluid (image 21). Preserved cortical echogenicity. No hydronephrosis. Bladder: Diminutive, unremarkable. Other: Echogenic liver (image 3) as seen earlier today. Prostate measures up to 50 mm diameter. IMPRESSION: 1. Trace pararenal fluid on the left but otherwise normal ultrasound appearance of both kidneys. 2. Diminutive and unremarkable bladder. Electronically Signed   By: Genevie Ann M.D.   On: 10/15/2019 19:55   DG CHEST PORT 1 VIEW  Result Date: 10/20/2019 CLINICAL DATA:  Fever EXAM: PORTABLE CHEST 1 VIEW COMPARISON:  October 15, 2019 FINDINGS: There is atelectatic change in the lung bases. There are bullae in the medial right upper lobe. Lungs elsewhere are clear. Heart size and pulmonary vascularity are normal. No adenopathy. There is aortic atherosclerosis. No bone lesions. IMPRESSION: Mild bibasilar atelectasis. Bullous disease right upper lobe medially. No edema or airspace opacity. Heart size normal. Aortic Atherosclerosis (ICD10-I70.0). Electronically  Signed   By: Lowella Grip III M.D.   On: 10/20/2019 08:58   DG CHEST PORT 1 VIEW  Result Date: 10/15/2019 CLINICAL DATA:  Tachycardia EXAM: PORTABLE CHEST 1 VIEW COMPARISON:  08/21/2019 FINDINGS: Cardiac shadow is within normal limits. Aortic calcifications are seen. The lungs are clear. No bony abnormality is noted. IMPRESSION: No acute abnormality seen. Electronically Signed   By: Inez Catalina M.D.   On: 10/15/2019 02:58   ECHOCARDIOGRAM COMPLETE  Result Date: 10/15/2019    ECHOCARDIOGRAM REPORT   Patient Name:   Shanna Wirick Date of Exam: 10/15/2019 Medical Rec #:  [id number]     Height:       66.5 in Accession #:    [id number]    Weight:       120.0 lb Date of Birth:  Jul 30, 1960     BSA:          1.619 m Patient Age:    80 years      BP:           107/59 mmHg Patient Gender: M             HR:           97 bpm. Exam  Location:  Inpatient Procedure: 2D Echo Indications:    Abnormal ECG R94.31  History:        Patient has prior history of Echocardiogram examinations, most                 recent 04/30/2019. Cardiomyopathy; Risk Factors:Alcohol abuse.  Sonographer:    Mikki Santee RDCS (AE) Referring Phys: [place]  1. Left ventricular ejection fraction, by estimation, is 60 to 65%. The left ventricle has normal function. The left ventricle has no regional wall motion abnormalities. Left ventricular diastolic parameters are consistent with Grade I diastolic dysfunction (impaired relaxation).  2. Right ventricular systolic function is normal. The right ventricular size is normal. There is normal pulmonary artery systolic pressure.  3. Left atrial size was moderately dilated.  4. The mitral valve is normal in structure. No evidence of mitral valve regurgitation. No evidence of mitral stenosis.  5. The aortic valve is tricuspid. Aortic valve regurgitation is not visualized. No aortic stenosis is present.  6. Aortic dilatation noted. There is mild dilatation at the level of the sinuses of Valsalva measuring 41 mm.  7. The inferior vena cava is normal in size with greater than 50% respiratory variability, suggesting right atrial pressure of 3 mmHg. FINDINGS  Left Ventricle: Left ventricular ejection fraction, by estimation, is 60 to 65%. The left ventricle has normal function. The left ventricle has no regional wall motion abnormalities. The left ventricular internal cavity size was normal in size. There is  no left ventricular hypertrophy. Left ventricular diastolic parameters are consistent with Grade I diastolic dysfunction (impaired relaxation). Right Ventricle: The right ventricular size is normal. No increase in right ventricular wall thickness. Right ventricular systolic function is normal. There is normal pulmonary artery systolic pressure. The tricuspid regurgitant velocity is 1.87 m/s, and  with  an assumed right atrial pressure of 3 mmHg, the estimated right ventricular systolic pressure is 62.9 mmHg. Left Atrium: Left atrial size was moderately dilated. Right Atrium: Right atrial size was normal in size. Pericardium: There is no evidence of pericardial effusion. Mitral Valve: The mitral valve is normal in structure. Normal mobility of the mitral valve leaflets. No evidence of mitral valve regurgitation. No evidence of mitral valve stenosis. Tricuspid Valve:  The tricuspid valve is normal in structure. Tricuspid valve regurgitation is mild . No evidence of tricuspid stenosis. Aortic Valve: The aortic valve is tricuspid. Aortic valve regurgitation is not visualized. No aortic stenosis is present. Pulmonic Valve: The pulmonic valve was normal in structure. Pulmonic valve regurgitation is not visualized. No evidence of pulmonic stenosis. Aorta: Aortic dilatation noted. There is mild dilatation at the level of the sinuses of Valsalva measuring 41 mm. Venous: The inferior vena cava is normal in size with greater than 50% respiratory variability, suggesting right atrial pressure of 3 mmHg. IAS/Shunts: No atrial level shunt detected by color flow Doppler.  LEFT VENTRICLE PLAX 2D LVIDd:         4.75 cm  Diastology LVIDs:         4.32 cm  LV e' lateral: 9.14 cm/s LV PW:         0.94 cm LV IVS:        0.80 cm LVOT diam:     2.60 cm LV SV:         52 LV SV Index:   32 LVOT Area:     5.31 cm  RIGHT VENTRICLE RV S prime:     9.57 cm/s TAPSE (M-mode): 1.1 cm LEFT ATRIUM             Index       RIGHT ATRIUM           Index LA diam:        3.10 cm 1.92 cm/m  RA Area:     13.90 cm LA Vol (A2C):   63.2 ml 39.05 ml/m RA Volume:   35.30 ml  21.81 ml/m LA Vol (A4C):   63.0 ml 38.92 ml/m LA Biplane Vol: 64.6 ml 39.91 ml/m  AORTIC VALVE LVOT Vmax:   54.80 cm/s LVOT Vmean:  42.400 cm/s LVOT VTI:    0.097 m  AORTA Ao Root diam: 4.10 cm Ao Asc diam:  4.20 cm TRICUSPID VALVE TR Peak grad:   14.0 mmHg TR Vmax:        187.00 cm/s   SHUNTS Systemic VTI:  0.10 m Systemic Diam: 2.60 cm Skeet Latch MD Electronically signed by Skeet Latch MD Signature Date/Time: 10/15/2019/4:06:01 PM    Final    US Abdomen Limited RUQ  Result Date: 10/15/2019 CLINICAL DATA:  Elevated LFTs EXAM: ULTRASOUND ABDOMEN LIMITED RIGHT UPPER QUADRANT COMPARISON:  None. FINDINGS: Gallbladder: Layering calcified gallstones are present the largest measuring 3 mm. Layering sludge is also noted. No sonographic Murphy sign or wall thickening is seen. Common bile duct: Diameter: 3 mm Liver: Increased echotexture seen throughout. No focal abnormality or biliary ductal dilatation. Portal vein is patent on color Doppler imaging with normal direction of blood flow towards the liver. Other: None. IMPRESSION: Cholelithiasis without evidence of acute cholecystitis. Hepatic steatosis Electronically Signed   By: Prudencio Pair M.D.   On: 10/15/2019 02:17    Microbiology: Recent Results (from the past 240 hour(s))  SARS Coronavirus 2 by RT PCR (hospital order, performed in [facility] hospital lab) Nasopharyngeal Nasopharyngeal Swab     Status: None   Collection Time: 10/15/19 12:22 AM   Specimen: Nasopharyngeal Swab  Result Value Ref Range Status   SARS Coronavirus 2 NEGATIVE NEGATIVE Final    Comment: (NOTE) SARS-CoV-2 target nucleic acids are NOT DETECTED.  The SARS-CoV-2 RNA is generally detectable in upper and lower respiratory specimens during the acute phase of infection. The lowest concentration of SARS-CoV-2 viral copies this assay  can detect is 250 copies / mL. A negative result does not preclude SARS-CoV-2 infection and should not be used as the sole basis for treatment or other patient management decisions.  A negative result may occur with improper specimen collection / handling, submission of specimen other than nasopharyngeal swab, presence of viral mutation(s) within the areas targeted by this assay, and inadequate number of viral  copies (<250 copies / mL). A negative result must be combined with clinical observations, patient history, and epidemiological information.  Fact Sheet for Patients:   StrictlyIdeas.no  Fact Sheet for Healthcare Providers: BankingDealers.co.za  This test is not yet approved or  cleared by the [place] FDA and has been authorized for detection and/or diagnosis of SARS-CoV-2 by FDA under an Emergency Use Authorization (EUA).  This EUA will remain in effect (meaning this test can be used) for the duration of the COVID-19 declaration under Section 564(b)(1) of the Act, 21 U.S.C. section 360bbb-3(b)(1), unless the authorization is terminated or revoked sooner.  Performed at [facility], [place] [address], [place], [place] [address]   Blood culture (routine x 2)     Status: Abnormal   Collection Time: 10/15/19 12:45 AM   Specimen: BLOOD  Result Value Ref Range Status   Specimen Description   Final    BLOOD BLOOD RIGHT ARM Performed at [facility] [address], [place], [place] [address]    Special Requests   Final    BOTTLES DRAWN AEROBIC AND ANAEROBIC Blood Culture results may not be optimal due to an inadequate volume of blood received in culture bottles Performed at [facility] [address] [place], [place] [address]    Culture  Setup Time   Final    GRAM POSITIVE COCCI IN CLUSTERS IN BOTH AEROBIC AND ANAEROBIC BOTTLES CRITICAL VALUE NOTED.  VALUE IS CONSISTENT WITH PREVIOUSLY REPORTED AND CALLED VALUE.    Culture (A)  Final    STAPHYLOCOCCUS HOMINIS SUSCEPTIBILITIES PERFORMED ON PREVIOUS CULTURE WITHIN THE LAST 5 DAYS. Performed at [facility], [place] [address] [place], [place] [address]    Report Status 10/18/2019 FINAL  Final  Blood culture (routine x 2)     Status: Abnormal   Collection Time: 10/15/19 12:45 AM   Specimen: BLOOD  Result Value Ref  Range Status   Specimen Description   Final    BLOOD BLOOD LEFT FOREARM Performed at [facility] [address], [place], [place] [address]    Special Requests   Final    BOTTLES DRAWN AEROBIC AND ANAEROBIC Blood Culture adequate volume Performed at [facility] [address], [place], [place] [address]    Culture  Setup Time   Final    GRAM POSITIVE COCCI IN CLUSTERS AEROBIC BOTTLE ONLY Organism ID to follow CRITICAL RESULT CALLED TO, READ BACK BY AND VERIFIED WITH: Elenore Paddy PHARMD [id number] 10/15/19 A BROWNING Performed at [facility], [place] [address] [place], [place] [address]    Culture STAPHYLOCOCCUS HOMINIS (A)  Final   Report Status 10/17/2019 FINAL  Final   Organism ID, Bacteria STAPHYLOCOCCUS HOMINIS  Final      Susceptibility   Staphylococcus hominis - MIC*    CIPROFLOXACIN <=0.5 SENSITIVE Sensitive     ERYTHROMYCIN INTERMEDIATE Intermediate     GENTAMICIN <=0.5 SENSITIVE Sensitive     OXACILLIN <=0.25 SENSITIVE Sensitive     TETRACYCLINE <=1 SENSITIVE Sensitive     VANCOMYCIN <=0.5 SENSITIVE Sensitive  TRIMETH/SULFA <=10 SENSITIVE Sensitive     CLINDAMYCIN >=8 RESISTANT Resistant     RIFAMPIN <=0.5 SENSITIVE Sensitive     Inducible Clindamycin NEGATIVE Sensitive     * STAPHYLOCOCCUS HOMINIS  Blood Culture ID Panel (Reflexed)     Status: Abnormal   Collection Time: 10/15/19 12:45 AM  Result Value Ref Range Status   Enterococcus species NOT DETECTED NOT DETECTED Final   Listeria monocytogenes NOT DETECTED NOT DETECTED Final   Staphylococcus species DETECTED (A) NOT DETECTED Final    Comment: Methicillin (oxacillin) resistant coagulase negative staphylococcus. Possible blood culture contaminant (unless isolated from more than one blood culture draw or clinical case suggests pathogenicity). No antibiotic treatment is indicated for blood  culture contaminants. CRITICAL RESULT CALLED TO, READ BACK BY AND VERIFIED WITH: L  POINDEXTER PHARMD 2328 10/15/19 A BROWNING    Staphylococcus aureus (BCID) NOT DETECTED NOT DETECTED Final   Methicillin resistance DETECTED (A) NOT DETECTED Final    Comment: CRITICAL RESULT CALLED TO, READ BACK BY AND VERIFIED WITH: L POINDEXTER PHARMD 2328 10/15/19 A BROWNING    Streptococcus species NOT DETECTED NOT DETECTED Final   Streptococcus agalactiae NOT DETECTED NOT DETECTED Final   Streptococcus pneumoniae NOT DETECTED NOT DETECTED Final   Streptococcus pyogenes NOT DETECTED NOT DETECTED Final   Acinetobacter baumannii NOT DETECTED NOT DETECTED Final   Enterobacteriaceae species NOT DETECTED NOT DETECTED Final   Enterobacter cloacae complex NOT DETECTED NOT DETECTED Final   Escherichia coli NOT DETECTED NOT DETECTED Final   Klebsiella oxytoca NOT DETECTED NOT DETECTED Final   Klebsiella pneumoniae NOT DETECTED NOT DETECTED Final   Proteus species NOT DETECTED NOT DETECTED Final   Serratia marcescens NOT DETECTED NOT DETECTED Final   Haemophilus influenzae NOT DETECTED NOT DETECTED Final   Neisseria meningitidis NOT DETECTED NOT DETECTED Final   Pseudomonas aeruginosa NOT DETECTED NOT DETECTED Final   Candida albicans NOT DETECTED NOT DETECTED Final   Candida glabrata NOT DETECTED NOT DETECTED Final   Candida krusei NOT DETECTED NOT DETECTED Final   Candida parapsilosis NOT DETECTED NOT DETECTED Final   Candida tropicalis NOT DETECTED NOT DETECTED Final    Comment: Performed at [facility], [place]. [address], [place], [place] [address]  Culture, Urine     Status: Abnormal   Collection Time: 10/15/19  2:15 AM   Specimen: Urine, Clean Catch  Result Value Ref Range Status   Specimen Description   Final    URINE, CLEAN CATCH Performed at [facility], [place] [address], [place], [place] [address]    Special Requests   Final    NONE Performed at [facility], [place] [address], [place], [place] [address]    Culture MULTIPLE SPECIES  PRESENT, SUGGEST RECOLLECTION (A)  Final   Report Status 10/16/2019 FINAL  Final  Culture, blood (routine x 2)     Status: None   Collection Time: 10/16/19  8:55 AM   Specimen: BLOOD LEFT HAND  Result Value Ref Range Status   Specimen Description   Final    BLOOD LEFT HAND Performed at [facility] [address] [place], [place] [address]    Special Requests   Final    BOTTLES DRAWN AEROBIC ONLY Blood Culture adequate volume Performed at [facility] [address] [place], [place] [address]    Culture   Final    NO GROWTH 5 DAYS Performed at [facility], [place] [address], [place], [place]  [address]    Report Status 10/21/2019 FINAL  Final  Culture, blood (routine x 2)     Status: None   Collection Time: 10/16/19  9:28 AM   Specimen: BLOOD  Result Value Ref Range Status   Specimen Description   Final    BLOOD RIGHT ARM Performed at [facility] [address] [place], [place] [address]    Special Requests   Final    BOTTLES DRAWN AEROBIC ONLY Blood Culture adequate volume Performed at [facility] [address] [place], [place] [address]    Culture   Final    NO GROWTH 5 DAYS Performed at [facility], [place] [address], [place],  [address]    Report Status 10/21/2019 FINAL  Final     Labs: Basic Metabolic Panel: Recent Labs  Lab 10/18/19 0604 10/18/19 0604 10/19/19 8938 10/19/19 1017 10/20/19 0522 10/21/19 0550 10/22/19 0550 10/23/19 0526 10/24/19 0821  NA 138   < > 136   < > 135 132* 133* 131* 136  K 3.6   < > 3.7   < > 4.2 4.3 4.0 3.9 3.9  CL 104   < > 104   < > 103 101 103 104 104  CO2 26   < > 25   < > 25 25 22 22 23   GLUCOSE 93   < > 87   < > 82 83 93 91 92  BUN 9   < > 7   < > 8 8 6 6  5*  CREATININE 0.55*   < > 0.57*   < > 0.61 0.63 0.57* 0.64 0.65  CALCIUM 8.4*   < > 8.3*   < > 8.0* 8.1* 8.2* 8.1* 8.3*  MG 1.4*  --  1.5*  --  1.5* 1.4* 2.1  --   --    PHOS 2.4*  --  3.1  --  3.2 3.5 3.9  --   --    < > = values in this interval not displayed.   Liver Function Tests: Recent Labs  Lab 10/18/19 0604 10/19/19 0607 10/20/19 0522 10/21/19 0550 10/22/19 0550  AST 90* 80* 83* 79* 70*  ALT 40 37 35 37 36  ALKPHOS 87 85 84 79 84  BILITOT 1.0 1.1 1.0 1.0 1.5*  PROT 5.8* 5.9* 5.5* 5.3* 5.2*  ALBUMIN 3.0* 2.8* 2.7* 2.5* 2.5*   No results for input(s): LIPASE, AMYLASE in the last 168 hours. No results for input(s): AMMONIA in the last 168 hours. CBC: Recent Labs  Lab 10/19/19 0607 10/19/19 0607 10/20/19 0522 10/20/19 0522 10/20/19 1355 10/21/19 0550 10/22/19 0550 10/23/19 0526 10/24/19 0821  WBC 5.7  --  4.6  --   --  4.9 5.7 6.6  --   NEUTROABS 3.4  --  2.5  --   --  3.2 3.0 3.7  --   HGB 8.7*   < > 7.4*   < > 8.3* 8.1* 8.9* 8.9* 9.7*  HCT 26.4*   < > 22.8*   < > 25.5* 24.6* 27.3* 27.3* 30.1*  MCV 114.8*  --  114.6*  --   --  112.3* 109.2* 109.2*  --   PLT 101*  --  98*  --   --  111* 123* 140*  --    < > = values in this interval not displayed.   Cardiac Enzymes: Recent Labs  Lab 10/22/19 0550  CKTOTAL 24*   BNP: BNP (last 3 results) Recent Labs  07/17/19 1058 08/21/19 0117 10/15/19 0215  BNP 54.3 53.9 171.8*    ProBNP (last 3 results) No results for input(s): PROBNP in the last 8760 hours.  CBG: No results for input(s): GLUCAP in the last 168 hours.     Signed:  Irine Seal MD.  [facility] 10/24/2019, 11:28 AM

## 2019-10-27 ENCOUNTER — Telehealth: Payer: Self-pay

## 2019-10-27 NOTE — Telephone Encounter (Signed)
Transition Care Management Follow-up Telephone Call Date of discharge and from where: 10/24/2019, [facility]   Patient has no phone and no contact information.  He has an appointment at [facility] 11/06/2019.

## 2019-11-06 ENCOUNTER — Ambulatory Visit: Payer: Medicaid Other | Admitting: Physician Assistant

## 2020-02-18 ENCOUNTER — Ambulatory Visit: Payer: Medicaid Other | Attending: Critical Care Medicine | Admitting: Critical Care Medicine

## 2020-02-18 ENCOUNTER — Other Ambulatory Visit: Payer: Self-pay
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
## 2020-08-14 ENCOUNTER — Emergency Department (HOSPITAL_COMMUNITY)
Admission: EM | Admit: 2020-08-14 | Discharge: 2020-08-15 | Disposition: A | Discharge status: Home / Self Care | Payer: Medicaid Other | Attending: Emergency Medicine | Admitting: Emergency Medicine

## 2020-08-14 ENCOUNTER — Other Ambulatory Visit: Payer: Self-pay

## 2020-08-14 ENCOUNTER — Encounter (HOSPITAL_COMMUNITY): Payer: Self-pay

## 2020-08-14 DIAGNOSIS — I5022 Chronic systolic (congestive) heart failure: Secondary | ICD-10-CM | POA: Insufficient documentation

## 2020-08-14 DIAGNOSIS — I2510 Atherosclerotic heart disease of native coronary artery without angina pectoris: Secondary | ICD-10-CM | POA: Diagnosis not present

## 2020-08-14 DIAGNOSIS — J449 Chronic obstructive pulmonary disease, unspecified: Secondary | ICD-10-CM | POA: Diagnosis not present

## 2020-08-14 DIAGNOSIS — R112 Nausea with vomiting, unspecified: Secondary | ICD-10-CM | POA: Diagnosis not present

## 2020-08-14 DIAGNOSIS — F17210 Nicotine dependence, cigarettes, uncomplicated: Secondary | ICD-10-CM | POA: Insufficient documentation

## 2020-08-14 DIAGNOSIS — Z85828 Personal history of other malignant neoplasm of skin: Secondary | ICD-10-CM | POA: Diagnosis not present

## 2020-08-14 DIAGNOSIS — R1012 Left upper quadrant pain: Secondary | ICD-10-CM | POA: Diagnosis not present

## 2020-08-14 DIAGNOSIS — R1010 Upper abdominal pain, unspecified: Secondary | ICD-10-CM | POA: Diagnosis present

## 2020-08-14 LAB — CBC
HCT: 28.3 % — ABNORMAL LOW (ref 39.0–52.0)
Hemoglobin: 9.4 g/dL — ABNORMAL LOW (ref 13.0–17.0)
MCH: 37.6 pg — ABNORMAL HIGH (ref 26.0–34.0)
MCHC: 33.2 g/dL (ref 30.0–36.0)
MCV: 113.2 fL — ABNORMAL HIGH (ref 80.0–100.0)
Platelets: 49 10*3/uL — ABNORMAL LOW (ref 150–400)
RBC: 2.5 MIL/uL — ABNORMAL LOW (ref 4.22–5.81)
RDW: 14 % (ref 11.5–15.5)
WBC: 6.9 10*3/uL (ref 4.0–10.5)
nRBC: 0 % (ref 0.0–0.2)

## 2020-08-14 LAB — COMPREHENSIVE METABOLIC PANEL
ALT: 31 U/L (ref 0–44)
AST: 93 U/L — ABNORMAL HIGH (ref 15–41)
Albumin: 3.1 g/dL — ABNORMAL LOW (ref 3.5–5.0)
Alkaline Phosphatase: 100 U/L (ref 38–126)
Anion gap: 8 (ref 5–15)
BUN: 9 mg/dL (ref 6–20)
CO2: 22 mmol/L (ref 22–32)
Calcium: 8.2 mg/dL — ABNORMAL LOW (ref 8.9–10.3)
Chloride: 107 mmol/L (ref 98–111)
Creatinine, Ser: 0.71 mg/dL (ref 0.61–1.24)
GFR, Estimated: >60 mL/min (ref >60)
Glucose, Bld: 97 mg/dL (ref 70–99)
Potassium: 3.4 mmol/L — ABNORMAL LOW (ref 3.5–5.1)
Sodium: 137 mmol/L (ref 135–145)
Total Bilirubin: 1 mg/dL (ref 0.3–1.2)
Total Protein: 7.2 g/dL (ref 6.5–8.1)

## 2020-08-14 LAB — LIPASE, BLOOD: Lipase: 120 U/L — ABNORMAL HIGH (ref 11–51)

## 2020-08-14 NOTE — ED Triage Notes (Signed)
Patient arrives with [facility] for abdominal pain and swelling, and rash to abdomen. Patient is homeless but has housing arrangement to start Thursday that he is worried about losing.

## 2020-08-15 LAB — URINALYSIS, ROUTINE W REFLEX MICROSCOPIC
Bilirubin Urine: NEGATIVE
Glucose, UA: NEGATIVE mg/dL
Hgb urine dipstick: NEGATIVE
Ketones, ur: NEGATIVE mg/dL
Leukocytes,Ua: NEGATIVE
Nitrite: NEGATIVE
Protein, ur: 30 mg/dL — AB
Specific Gravity, Urine: 1.026 (ref 1.005–1.030)
pH: 5 (ref 5.0–8.0)

## 2020-08-15 MED ORDER — ALUM & MAG HYDROXIDE-SIMETH 200-200-20 MG/5ML PO SUSP
30.0000 mL | Freq: Once | ORAL | Status: AC
  Administered 2020-08-15: 30 mL via ORAL
  Filled 2020-08-15: qty 30

## 2020-08-15 MED ORDER — ONDANSETRON 4 MG PO TBDP: 4.0000 mg | ORAL_TABLET | Freq: Three times a day (TID) | ORAL | 0 refills | Status: AC | PRN

## 2020-08-15 MED ORDER — LIDOCAINE VISCOUS HCL 2 % MT SOLN
15.0000 mL | Freq: Once | OROMUCOSAL | Status: AC
  Administered 2020-08-15: 15 mL via ORAL
  Filled 2020-08-15: qty 15

## 2020-08-15 NOTE — ED Provider Notes (Signed)
[facility] EMERGENCY DEPARTMENT Provider Note  CSN: [id number] Arrival date & time: 08/14/20 2144  Chief Complaint(s) Abdominal Pain  HPI Trevor Williams is a 60 y.o. male with a past medical history listed below including daily alcohol use who presents to the emergency department with several months of upper abdominal pain aching.  Patient endorses some mild nausea with nonbloody nonbilious emesis for weeks - maybe once a day.  Has not had any emesis for couple days.  He denies any fevers or chills.  No chest pain or shortness of breath.  No other physical complaints.  HPI  Past Medical History Past Medical History:  Diagnosis Date  . Abnormal liver function tests   . Acute respiratory failure ([place])    a. 07/2018 requiring intubation - CAP/CHF.  Marland Kitchen Alcohol abuse   . Biventricular heart failure ([place])   . Cancer ([place])    skin (left hand)  . Delirium    a. h/o delirium while admitted  . Emphysema of lung ([place])   . Homelessness   . Macrocytic anemia   . Mild CAD    a. cath 07/2018 60% ostial ramus otherwise OK.  . Mitral regurgitation   . NICM (nonischemic cardiomyopathy) ([place])   . Protein calorie malnutrition ([place])   . Tricuspid regurgitation    Patient Active Problem List   Diagnosis Date Noted  . Generalized weakness   . AKI (acute kidney injury) ([place])   . Positive blood culture 10/16/2019  . COPD (chronic obstructive pulmonary disease) ([place]) 10/16/2019  . Macrocytic anemia 10/16/2019  . Dehydration 10/15/2019  . Dizziness 10/15/2019  . Closed nondisplaced intertrochanteric fracture of right femur ([place])   . Alcoholic intoxication without complication ([place]) 99/83/3825  . Chronic systolic heart failure ([place]) 03/03/2019  . Elevated LFTs 03/03/2019  . Left lateral knee pain 03/03/2019  . Iron deficiency anemia due to chronic blood loss 03/03/2019  . Hypomagnesemia 03/03/2019  . Alcohol use 01/26/2019  . Homeless 01/26/2019  . Hypokalemia 01/26/2019  .  Simple chronic bronchitis ([place]) 01/25/2015  . Tobacco use disorder 01/25/2015  . Chronic midline low back pain with sciatica 12/25/2014   Home Medication(s) Prior to Admission medications   Medication Sig Start Date End Date Taking? Authorizing Provider  ondansetron (ZOFRAN ODT) 4 MG disintegrating tablet Take 1 tablet (4 mg total) by mouth every 8 (eight) hours as needed for up to 3 days for nausea or vomiting. 08/15/20 08/18/20 Yes Ailani Governale, Grayce Sessions, MD  albuterol (VENTOLIN HFA) 108 (90 Base) MCG/ACT inhaler Inhale 2 puffs into the lungs every 4 (four) hours as needed for wheezing or shortness of breath. Patient not taking: Reported on 08/15/2020 10/24/19   Eugenie Filler, MD  carvedilol (COREG) 3.125 MG tablet Take 1 tablet (3.125 mg total) by mouth 2 (two) times daily with a meal. Patient not taking: Reported on 08/15/2020 10/24/19   Eugenie Filler, MD  docusate sodium (COLACE) 100 MG capsule Take 1 capsule (100 mg total) by mouth 2 (two) times daily. Patient not taking: Reported on 08/15/2020 10/24/19   Eugenie Filler, MD  ferrous sulfate 325 (65 FE) MG tablet Take 1 tablet (325 mg total) by mouth daily with breakfast. Patient not taking: Reported on 08/15/2020 10/25/19   Eugenie Filler, MD  folic acid (FOLVITE) 1 MG tablet Take 1 tablet (1 mg total) by mouth daily. Patient not taking: Reported on 08/15/2020 10/24/19   Eugenie Filler, MD  hydrOXYzine (ATARAX/VISTARIL) 25 MG tablet Take 1 tablet (  25 mg total) by mouth 3 (three) times daily as needed for itching or anxiety. Patient not taking: Reported on 08/15/2020 10/24/19   Eugenie Filler, MD  Multiple Vitamin (MULTIVITAMIN WITH MINERALS) TABS tablet Take 1 tablet by mouth daily. Patient not taking: Reported on 08/15/2020 10/25/19   Eugenie Filler, MD  nicotine (NICODERM CQ - DOSED IN MG/24 HOURS) 21 mg/24hr patch Place 1 patch (21 mg total) onto the skin daily. Patient not taking: Reported on 08/15/2020 10/24/19   Eugenie Filler, MD  pantoprazole (PROTONIX) 40 MG tablet Take 1 tablet (40 mg total) by mouth 2 (two) times daily before a meal. Patient not taking: Reported on 08/15/2020 10/24/19 11/23/19  Eugenie Filler, MD  rosuvastatin (CRESTOR) 10 MG tablet Take 1 tablet (10 mg total) by mouth daily at 6 PM. Patient not taking: Reported on 08/15/2020 10/24/19   Eugenie Filler, MD  sacubitril-valsartan (ENTRESTO) 24-26 MG Take 1 tablet by mouth 2 (two) times daily. Patient not taking: Reported on 08/15/2020 10/24/19   Eugenie Filler, MD  thiamine 100 MG tablet Take 1 tablet (100 mg total) by mouth daily. Patient not taking: Reported on 08/15/2020 10/24/19   Eugenie Filler, MD  traZODone (DESYREL) 100 MG tablet Take 1 tablet (100 mg total) by mouth at bedtime as needed for sleep. Patient not taking: Reported on 08/15/2020 10/24/19   Eugenie Filler, MD                                                                                                                                    Past Surgical History Past Surgical History:  Procedure Laterality Date  . BIOPSY  01/28/2019   Procedure: BIOPSY;  Surgeon: Otis Brace, MD;  Location: [facility];  Service: Gastroenterology;;  . BIOPSY  01/30/2019   Procedure: BIOPSY;  Surgeon: Otis Brace, MD;  [facility];  Service: Gastroenterology;;  . COLONOSCOPY WITH PROPOFOL N/A 01/30/2019   Procedure: COLONOSCOPY WITH PROPOFOL;  Surgeon: Otis Brace, MD;  [facility];  Service: Gastroenterology;  Laterality: N/A;  . ESOPHAGOGASTRODUODENOSCOPY (EGD) WITH PROPOFOL N/A 01/28/2019   Procedure: ESOPHAGOGASTRODUODENOSCOPY (EGD) WITH PROPOFOL;  Surgeon: Otis Brace, MD;  [facility];  Service: Gastroenterology;  Laterality: N/A;  . FEMUR IM NAIL Right 04/30/2019   Procedure: INTRAMEDULLARY (IM) NAIL FEMORAL;  Surgeon: Paralee Cancel, MD;  [facility];  Service: Orthopedics;  Laterality: Right;  . HEMOSTASIS CLIP PLACEMENT   01/30/2019   Procedure: HEMOSTASIS CLIP PLACEMENT;  Surgeon: Otis Brace, MD;  [facility];  Service: Gastroenterology;;  . POLYPECTOMY  01/30/2019   Procedure: POLYPECTOMY;  Surgeon: Otis Brace, MD;  [facility];  Service: Gastroenterology;;  . RIGHT/LEFT HEART CATH AND CORONARY ANGIOGRAPHY N/A 08/09/2018   Procedure: RIGHT/LEFT HEART CATH AND CORONARY ANGIOGRAPHY;  Surgeon: Larey Dresser, MD;  Location: [place] [facility];  Service: Cardiovascular;  Laterality: N/A;   Family  History Family History  Problem Relation Age of Onset  . Hypertension Maternal Grandfather     Social History Social History   Tobacco Use  . Smoking status: Current Every Day Smoker    Packs/day: 1.00    Types: Cigarettes  . Smokeless tobacco: Current User  Vaping Use  . Vaping Use: Never used  Substance Use Topics  . Alcohol use: Yes    Alcohol/week: 12.0 standard drinks    Types: 12 Cans of beer per week    Comment: Liquor daily - 1/2 of a fifth   . Drug use: No   Allergies Spironolactone, Doxycycline, Ibuprofen, Rocephin [ceftriaxone], Tylenol [acetaminophen], and Vancomycin  Review of Systems Review of Systems All other systems are reviewed and are negative for acute change except as noted in the HPI  Physical Exam Vital Signs  I have reviewed the triage vital signs BP 106/65 (BP Location: Right Arm)   Pulse 84   Temp 98 F (36.7 C) (Oral)   Resp 16   Ht 5\' 6"  (1.676 m)   Wt 59 kg   SpO2 99%   BMI 20.98 kg/m   Physical Exam Vitals reviewed.  Constitutional:      General: He is not in acute distress.    Appearance: He is well-developed. He is not diaphoretic.  HENT:     Head: Normocephalic and atraumatic.     Jaw: No trismus.     Right Ear: External ear normal.     Left Ear: External ear normal.     Nose: Nose normal.  Eyes:     General: No scleral icterus.    Conjunctiva/sclera: Conjunctivae normal.  Neck:     Trachea: Phonation normal.   Cardiovascular:     Rate and Rhythm: Normal rate and regular rhythm.  Pulmonary:     Effort: Pulmonary effort is normal. No respiratory distress.     Breath sounds: No stridor.  Abdominal:     General: There is no distension.     Tenderness: There is abdominal tenderness (mild) in the epigastric area and left upper quadrant.  Musculoskeletal:        General: Normal range of motion.     Cervical back: Normal range of motion.  Neurological:     Mental Status: He is alert and oriented to person, place, and time.  Psychiatric:        Behavior: Behavior normal.     ED Results and Treatments Labs (all labs ordered are listed, but only abnormal results are displayed) Labs Reviewed  LIPASE, BLOOD - Abnormal; Notable for the following components:      Result Value   Lipase 120 (*)    All other components within normal limits  COMPREHENSIVE METABOLIC PANEL - Abnormal; Notable for the following components:   Potassium 3.4 (*)    Calcium 8.2 (*)    Albumin 3.1 (*)    AST 93 (*)    All other components within normal limits  CBC - Abnormal; Notable for the following components:   RBC 2.50 (*)    Hemoglobin 9.4 (*)    HCT 28.3 (*)    MCV 113.2 (*)    MCH 37.6 (*)    Platelets 49 (*)    All other components within normal limits  URINALYSIS, ROUTINE W REFLEX MICROSCOPIC - Abnormal; Notable for the following components:   Color, Urine AMBER (*)    APPearance HAZY (*)    Protein, ur 30 (*)    Bacteria, UA MANY (*)  All other components within normal limits                                                                                                                         EKG  EKG Interpretation  Date/Time:    Ventricular Rate:    PR Interval:    QRS Duration:   QT Interval:    QTC Calculation:   R Axis:     Text Interpretation:        Radiology No results found.  Pertinent labs & imaging results that were available during my care of the patient were reviewed by me  and considered in my medical decision making (see chart for details).  Medications Ordered in ED Medications  alum & mag hydroxide-simeth (MAALOX/MYLANTA) 200-200-20 MG/5ML suspension 30 mL (30 mLs Oral Given 08/15/20 5852)    And  lidocaine (XYLOCAINE) 2 % viscous mouth solution 15 mL (15 mLs Oral Given 08/15/20 7782)                                                                                                                                    Procedures Procedures  (including critical care time)  Medical Decision Making / ED Course I have reviewed the nursing notes for this encounter and the patient's prior records (if available in EHR or on provided paperwork).   Trevor Williams was evaluated in Emergency Department on 08/15/2020 for the symptoms described in the history of present illness. He was evaluated in the context of the global COVID-19 pandemic, which necessitated consideration that the patient might be at risk for infection with the SARS-CoV-2 virus that causes COVID-19. Institutional protocols and algorithms that pertain to the evaluation of patients at risk for COVID-19 are in a state of rapid change based on information released by regulatory bodies including the CDC and federal and state organizations. These policies and algorithms were followed during the patient's care in the ED.  Patient's work-up notable for elevated lipase level slightly higher than his baseline but not 3 times upper limits of normal. Pain improved with GI cocktail. Chronic pancreatitis versus alcoholic gastritis. Rest of the labs are at patient's baseline and grossly reassuring. Able to tolerate oral intake. Recommend pancreatic diet.     Final Clinical Impression(s) / ED Diagnoses Final diagnoses:  Upper abdominal pain    The patient appears reasonably screened and/or stabilized for discharge and I doubt  any other medical condition or other [facility] requiring further screening, evaluation, or  treatment in the ED at this time prior to discharge. Safe for discharge with strict return precautions.  Disposition: Discharge  Condition: Good  I have discussed the results, Dx and Tx plan with the patient/family who expressed understanding and agree(s) with the plan. Discharge instructions discussed at length. The patient/family was given strict return precautions who verbalized understanding of the instructions. No further questions at time of discharge.    ED Discharge Orders         Ordered    ondansetron (ZOFRAN ODT) 4 MG disintegrating tablet  Every 8 hours PRN        08/15/20 1791           Follow Up: Elsie Stain, MD [address] [address] [phone]  Call  to schedule an appointment for close follow up      This chart was dictated using voice recognition software.  Despite best efforts to proofread,  errors can occur which can change the documentation meaning.   Fatima Blank, MD 08/15/20 [phone]

## 2020-08-15 NOTE — ED Notes (Signed)
Pt resting comfortably in bed. Ambulatory to restroom without difficulty. Pt does not appear in distress, respirations are even and non-labored

## 2020-12-16 ENCOUNTER — Emergency Department (HOSPITAL_COMMUNITY)
Admission: EM | Admit: 2020-12-16 | Discharge: 2020-12-18 | Disposition: E | Payer: Medicaid Other | Attending: Emergency Medicine | Admitting: Emergency Medicine

## 2020-12-16 ENCOUNTER — Encounter (HOSPITAL_COMMUNITY): Payer: Self-pay

## 2020-12-16 ENCOUNTER — Other Ambulatory Visit: Payer: Self-pay

## 2020-12-16 DIAGNOSIS — Z85828 Personal history of other malignant neoplasm of skin: Secondary | ICD-10-CM | POA: Insufficient documentation

## 2020-12-16 DIAGNOSIS — F17210 Nicotine dependence, cigarettes, uncomplicated: Secondary | ICD-10-CM | POA: Diagnosis not present

## 2020-12-16 DIAGNOSIS — I469 Cardiac arrest, cause unspecified: Secondary | ICD-10-CM | POA: Diagnosis not present

## 2020-12-16 DIAGNOSIS — S299XXA Unspecified injury of thorax, initial encounter: Secondary | ICD-10-CM | POA: Diagnosis present

## 2020-12-16 DIAGNOSIS — X58XXXA Exposure to other specified factors, initial encounter: Secondary | ICD-10-CM | POA: Diagnosis not present

## 2020-12-16 DIAGNOSIS — S20419A Abrasion of unspecified back wall of thorax, initial encounter: Secondary | ICD-10-CM | POA: Insufficient documentation

## 2020-12-16 DIAGNOSIS — J449 Chronic obstructive pulmonary disease, unspecified: Secondary | ICD-10-CM | POA: Diagnosis not present

## 2020-12-16 MED ORDER — CALCIUM CHLORIDE 10 % IV SOLN
INTRAVENOUS | Status: AC | PRN
  Administered 2020-12-16: 1 g via INTRAVENOUS

## 2020-12-16 MED ORDER — AMIODARONE HCL 150 MG/3ML IV SOLN
INTRAVENOUS | Status: AC | PRN
  Administered 2020-12-16: 300 mg via INTRAVENOUS

## 2020-12-16 MED ORDER — EPINEPHRINE 1 MG/10ML IJ SOSY
PREFILLED_SYRINGE | INTRAMUSCULAR | Status: AC | PRN
  Administered 2020-12-16 (×2): 1 mg via INTRAVENOUS

## 2020-12-16 MED ORDER — SODIUM CHLORIDE 0.9 % IV BOLUS
1000.0000 mL | Freq: Once | INTRAVENOUS | Status: AC
  Administered 2020-12-16: 1000 mL via INTRAVENOUS

## 2020-12-18 NOTE — ED Triage Notes (Signed)
Pt arrived to ED via EMS as a cardiac arrest. [place]. CPR was started at 0714. Pt arrived w/ lucas in place and compressing, ETT, and IO to L tibia. EMS reports pt was found to be in v-fib. EMS defibrillated x 5, gave 6 epi, 2g mag, 450mg  amio, and 1L NS bolus. CBG 290. CPR in progress upon arrival to ED.

## 2020-12-18 NOTE — Code Documentation (Signed)
Time of death 0812. Asystole on the monitor no pulses.

## 2020-12-18 NOTE — ED Provider Notes (Signed)
[facility] EMERGENCY DEPARTMENT Provider Note   CSN: [id number] Arrival date & time: Jan 08, 2021  0800     History Chief Complaint  Patient presents with   Cardiac Arrest    Trevor Williams is a 60 y.o. male.  HPI History is provided by EMS: This patient was [place] unresponsive and pulseless [place].  EMS placed him on cardiac monitor.  Rhythm was interpreted as ventricular fibrillation and  ACLS protocol was initiated.  Prior to arrival, patient got following interventions: 1 L IVF, 2 g of magnesium, 6 doses of 1 mg epinephrine, 5 defibrillation shocks, 450 mg of amiodarone, and intubation.  Patient underwent approximately 45 minutes of CPR prior to arrival.  No ROSC was obtained.  Patient was found be cold to touch and thus CPR was continued.  Per chart review, patient has history of alcoholism and homelessness.    Past Medical History:  Diagnosis Date   Abnormal liver function tests    Acute respiratory failure ([place])    a. 07/2018 requiring intubation - CAP/CHF.   Alcohol abuse    Biventricular heart failure ([facility])    Cancer ([facility])    skin (left hand)   Delirium    a. h/o delirium while admitted   Emphysema of lung ([facility])    Homelessness    Macrocytic anemia    Mild CAD    a. cath 07/2018 60% ostial ramus otherwise OK.   Mitral regurgitation    NICM (nonischemic cardiomyopathy) ([facility])    Protein calorie malnutrition ([facility])    Tricuspid regurgitation     Patient Active Problem List   Diagnosis Date Noted   Generalized weakness    AKI (acute kidney injury) ([place])    Positive blood culture 10/16/2019   COPD (chronic obstructive pulmonary disease) ([place]) 10/16/2019   Macrocytic anemia 10/16/2019   Dehydration 10/15/2019   Dizziness 10/15/2019   Closed nondisplaced intertrochanteric fracture of right femur ([place])    Alcoholic intoxication without complication ([place]) 62/13/0865   Chronic systolic heart failure ([place]) 03/03/2019    Elevated LFTs 03/03/2019   Left lateral knee pain 03/03/2019   Iron deficiency anemia due to chronic blood loss 03/03/2019   Hypomagnesemia 03/03/2019   Alcohol use 01/26/2019   Homeless 01/26/2019   Hypokalemia 01/26/2019   Simple chronic bronchitis ([place]) 01/25/2015   Tobacco use disorder 01/25/2015   Chronic midline low back pain with sciatica 12/25/2014    Past Surgical History:  Procedure Laterality Date   BIOPSY  01/28/2019   Procedure: BIOPSY;  Surgeon: Otis Brace, MD;  [facility];  Service: Gastroenterology;;   BIOPSY  01/30/2019   Procedure: BIOPSY;  Surgeon: Otis Brace, MD;  [facility];  Service: Gastroenterology;;   COLONOSCOPY WITH PROPOFOL N/A 01/30/2019   Procedure: COLONOSCOPY WITH PROPOFOL;  Surgeon: Otis Brace, MD;  [facility];  Service: Gastroenterology;  Laterality: N/A;   ESOPHAGOGASTRODUODENOSCOPY (EGD) WITH PROPOFOL N/A 01/28/2019   Procedure: ESOPHAGOGASTRODUODENOSCOPY (EGD) WITH PROPOFOL;  Surgeon: Otis Brace, MD;  [facility];  Service: Gastroenterology;  Laterality: N/A;   FEMUR IM NAIL Right 04/30/2019   Procedure: INTRAMEDULLARY (IM) NAIL FEMORAL;  Surgeon: Paralee Cancel, MD;  [facility];  Service: Orthopedics;  Laterality: Right;   HEMOSTASIS CLIP PLACEMENT  01/30/2019   Procedure: HEMOSTASIS CLIP PLACEMENT;  Surgeon: Otis Brace, MD;  [facility];  Service: Gastroenterology;;   POLYPECTOMY  01/30/2019   Procedure: POLYPECTOMY;  Surgeon: Otis Brace, MD;  [facility];  Service: Gastroenterology;;   RIGHT/LEFT HEART CATH AND CORONARY ANGIOGRAPHY N/A 08/09/2018   Procedure: RIGHT/LEFT HEART CATH AND CORONARY ANGIOGRAPHY;  Surgeon: Larey Dresser, MD;  Location: [place] [facility];  Service: Cardiovascular;  Laterality: N/A;       Family History  Problem Relation Age of Onset   Hypertension Maternal Grandfather     Social History    Tobacco Use   Smoking status: Every Day    Packs/day: 1.00    Types: Cigarettes   Smokeless tobacco: Current  Vaping Use   Vaping Use: Never used  Substance Use Topics   Alcohol use: Yes    Alcohol/week: 12.0 standard drinks    Types: 12 Cans of beer per week    Comment: Liquor daily - 1/2 of a fifth    Drug use: No    Home Medications Prior to Admission medications   Medication Sig Start Date End Date Taking? Authorizing Provider  albuterol (VENTOLIN HFA) 108 (90 Base) MCG/ACT inhaler Inhale 2 puffs into the lungs every 4 (four) hours as needed for wheezing or shortness of breath. Patient not taking: Reported on 08/15/2020 10/24/19   Eugenie Filler, MD  carvedilol (COREG) 3.125 MG tablet Take 1 tablet (3.125 mg total) by mouth 2 (two) times daily with a meal. Patient not taking: Reported on 08/15/2020 10/24/19   Eugenie Filler, MD  docusate sodium (COLACE) 100 MG capsule Take 1 capsule (100 mg total) by mouth 2 (two) times daily. Patient not taking: Reported on 08/15/2020 10/24/19   Eugenie Filler, MD  ferrous sulfate 325 (65 FE) MG tablet Take 1 tablet (325 mg total) by mouth daily with breakfast. Patient not taking: Reported on 08/15/2020 10/25/19   Eugenie Filler, MD  folic acid (FOLVITE) 1 MG tablet Take 1 tablet (1 mg total) by mouth daily. Patient not taking: Reported on 08/15/2020 10/24/19   Eugenie Filler, MD  hydrOXYzine (ATARAX/VISTARIL) 25 MG tablet Take 1 tablet (25 mg total) by mouth 3 (three) times daily as needed for itching or anxiety. Patient not taking: Reported on 08/15/2020 10/24/19   Eugenie Filler, MD  Multiple Vitamin (MULTIVITAMIN WITH MINERALS) TABS tablet Take 1 tablet by mouth daily. Patient not taking: Reported on 08/15/2020 10/25/19   Eugenie Filler, MD  nicotine (NICODERM CQ - DOSED IN MG/24 HOURS) 21 mg/24hr patch Place 1 patch (21 mg total) onto the skin daily. Patient not taking: Reported on 08/15/2020 10/24/19   Eugenie Filler, MD   pantoprazole (PROTONIX) 40 MG tablet Take 1 tablet (40 mg total) by mouth 2 (two) times daily before a meal. Patient not taking: Reported on 08/15/2020 10/24/19 11/23/19  Eugenie Filler, MD  rosuvastatin (CRESTOR) 10 MG tablet Take 1 tablet (10 mg total) by mouth daily at 6 PM. Patient not taking: Reported on 08/15/2020 10/24/19   Eugenie Filler, MD  sacubitril-valsartan (ENTRESTO) 24-26 MG Take 1 tablet by mouth 2 (two) times daily. Patient not taking: Reported on 08/15/2020 10/24/19   Eugenie Filler, MD  thiamine 100 MG tablet Take 1 tablet (100 mg total) by mouth daily. Patient not taking: Reported on 08/15/2020 10/24/19   Eugenie Filler, MD  traZODone (DESYREL) 100 MG tablet Take 1 tablet (100 mg total) by mouth at bedtime as needed for sleep. Patient not taking: Reported on 08/15/2020 10/24/19   Eugenie Filler, MD    Allergies    Spironolactone, Doxycycline, Ibuprofen, Rocephin [ceftriaxone], Tylenol [acetaminophen], and Vancomycin  Review of  Systems   Review of Systems  Unable to perform ROS: Patient unresponsive   Physical Exam Updated Vital Signs BP (!) 0/0   Pulse (!) 0   Temp (!) 77 F (25 C) (Temporal) Comment: 75, epic won't let this RN chart 75 F  Resp (!) 0   Ht 5\' 8"  (1.727 m) Comment: estimated  Wt 59 kg Comment: estimated  SpO2 (!) 0%   BMI 19.77 kg/m   Physical Exam Constitutional:      Appearance: He is cachectic. He is ill-appearing.     Interventions: He is intubated.  HENT:     Head: Normocephalic and atraumatic.     Right Ear: External ear normal.     Left Ear: External ear normal.     Nose: Nose normal.  Eyes:     General: Scleral icterus present.     Comments: Equals 5 mm, fixed and dilated bilaterally, no corneal reflex  Cardiovascular:     Comments: Pulseless.  Asystole on monitor Pulmonary:     Effort: He is intubated.     Comments: Intubated and ventilated with BVM Abdominal:     General: Abdomen is flat.     Palpations: Abdomen is  soft. There is no mass.  Genitourinary:    Penis: Normal.      Testes: Normal.  Musculoskeletal:     Right lower leg: No edema.     Left lower leg: No edema.  Skin:    General: Skin is cool and dry.     Coloration: Skin is ashen.     Findings: Abrasion present.     Comments: Abrasions on back with brambles stuck in clothes  Neurological:     GCS: GCS eye subscore is 1. GCS verbal subscore is 1. GCS motor subscore is 1.    ED Results / Procedures / Treatments   Labs (all labs ordered are listed, but only abnormal results are displayed) Labs Reviewed - No data to display  EKG None  Radiology No results found.  Procedures CPR  Date/Time: 2021-01-14 8:00 AM Performed by: Godfrey Pick, MD Authorized by: Godfrey Pick, MD  CPR Procedure Details:    ACLS/BLS initiated by EMS: Yes     CPR/ACLS performed in the ED: Yes     Duration of CPR (minutes):  57   Outcome: Pt declared dead    CPR performed via ACLS guidelines under my direct supervision.  See RN documentation for details including defibrillator use, medications, doses and timing. Comments:     Unknown downtime prior to initiation of ACLS.  Approximately 45 minutes of CPR performed prior to arrival by EMS.  12 minutes of CPR performed in the ED.   Medications Ordered in ED Medications  EPINEPHrine (ADRENALIN) 1 MG/10ML injection (1 mg Intravenous Given 01/14/21 0808)  calcium chloride injection (1 g Intravenous Given January 14, 2021 0805)  amiodarone (CORDARONE) injection (300 mg Intravenous Given 01-14-2021 0811)  sodium chloride 0.9 % bolus 1,000 mL (0 mLs Intravenous Stopped 01-14-2021 8891)    ED Course  I have reviewed the triage vital signs and the nursing notes.  Pertinent labs & imaging results that were available during my care of the patient were reviewed by me and considered in my medical decision making (see chart for details).    MDM Rules/Calculators/A&P                          Patient presents for cardiac arrest.   He was found  unresponsive and pulseless this morning.  Per PD, patient was last seen alive at around 52 PM last night.  EMS initiated ACLS.  Upon arrival in the ED, patient is cold to the touch.  He is chronically ill in appearance.  He has scleral icterus and is cachectic.  Pupils are fixed and dilated.  Corneal reflexes are absent.  Patient was placed on bedside cardiac monitor.  Rhythm showed minimal electrical cardiac activity.  Given that this might represent a ventricular fibrillation rhythm, CPR was continued.  2 additional doses of epinephrine were given.  Too defibrillatory shocks were given.  Calcium chloride was given.  Warm fluids were initiated.  Additional amiodarone was given.  CPR was continued for approximately 12 minutes.  At this point, patient has been undergoing CPR for approximately 1 hour.  On subsequent pulse check, rhythm found to be asystole.  CPR was discontinued.  Patient was declared deceased at 8:12 AM.  Case was referred to medical examiner.  Final Clinical Impression(s) / ED Diagnoses Final diagnoses:  Cardiac arrest [facility])    Rx / [place] Orders ED Discharge Orders     None        Godfrey Pick, MD 28-Dec-2020 1819

## 2020-12-18 NOTE — ED Notes (Signed)
[employer] trying to find out who next of kin is to notify them of pt death.

## 2020-12-18 NOTE — ED Notes (Signed)
Bag of clothes and shoes sent to [facility] w/ pt w/ sticker on it. Wallet given to security to lock up.

## 2020-12-18 DEATH — deceased

## 2021-09-14 IMAGING — DX DG CHEST 1V PORT
1 series · 1 of 1 positions shown · non-contrast
Comparison: August 03, 2018

CLINICAL DATA: Shortness of breath

EXAM:
PORTABLE CHEST 1 VIEW

[chest ap]
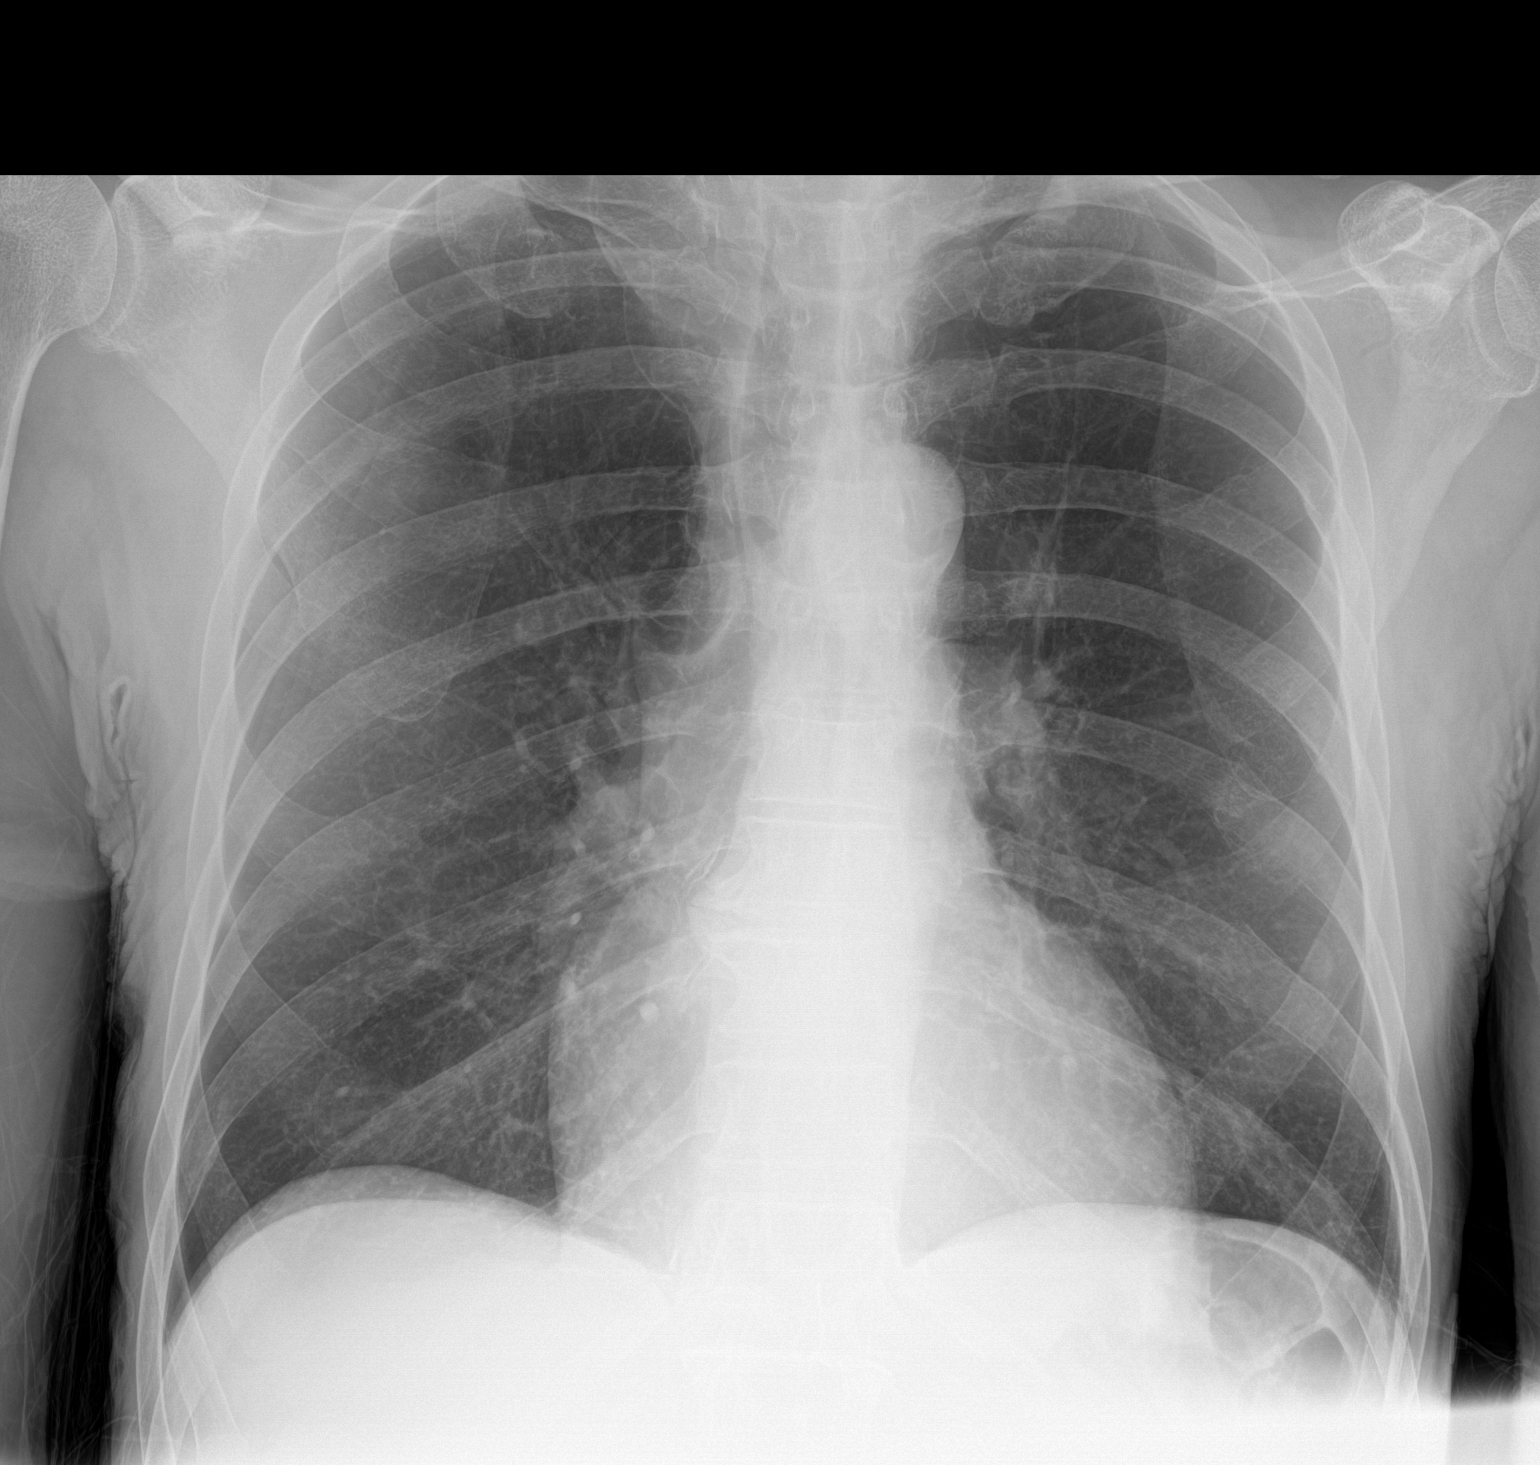

[1 of 1 positions shown; findings below may reference images not displayed]

FINDINGS: There is no evident edema or consolidation. Heart size and pulmonary
vascularity are normal. No adenopathy. No pneumothorax. No bone
lesions.
IMPRESSION: No edema or consolidation.

## 2022-05-26 IMAGING — CR DG HIP (WITH OR WITHOUT PELVIS) 2-3V*R*
3 series · 3 of 3 positions shown · non-contrast
Comparison: 07/02/2019

CLINICAL DATA: Hip pain, no known injury, initial encounter

EXAM:
DG HIP (WITH OR WITHOUT PELVIS) 2-3V RIGHT

[x pelvis]
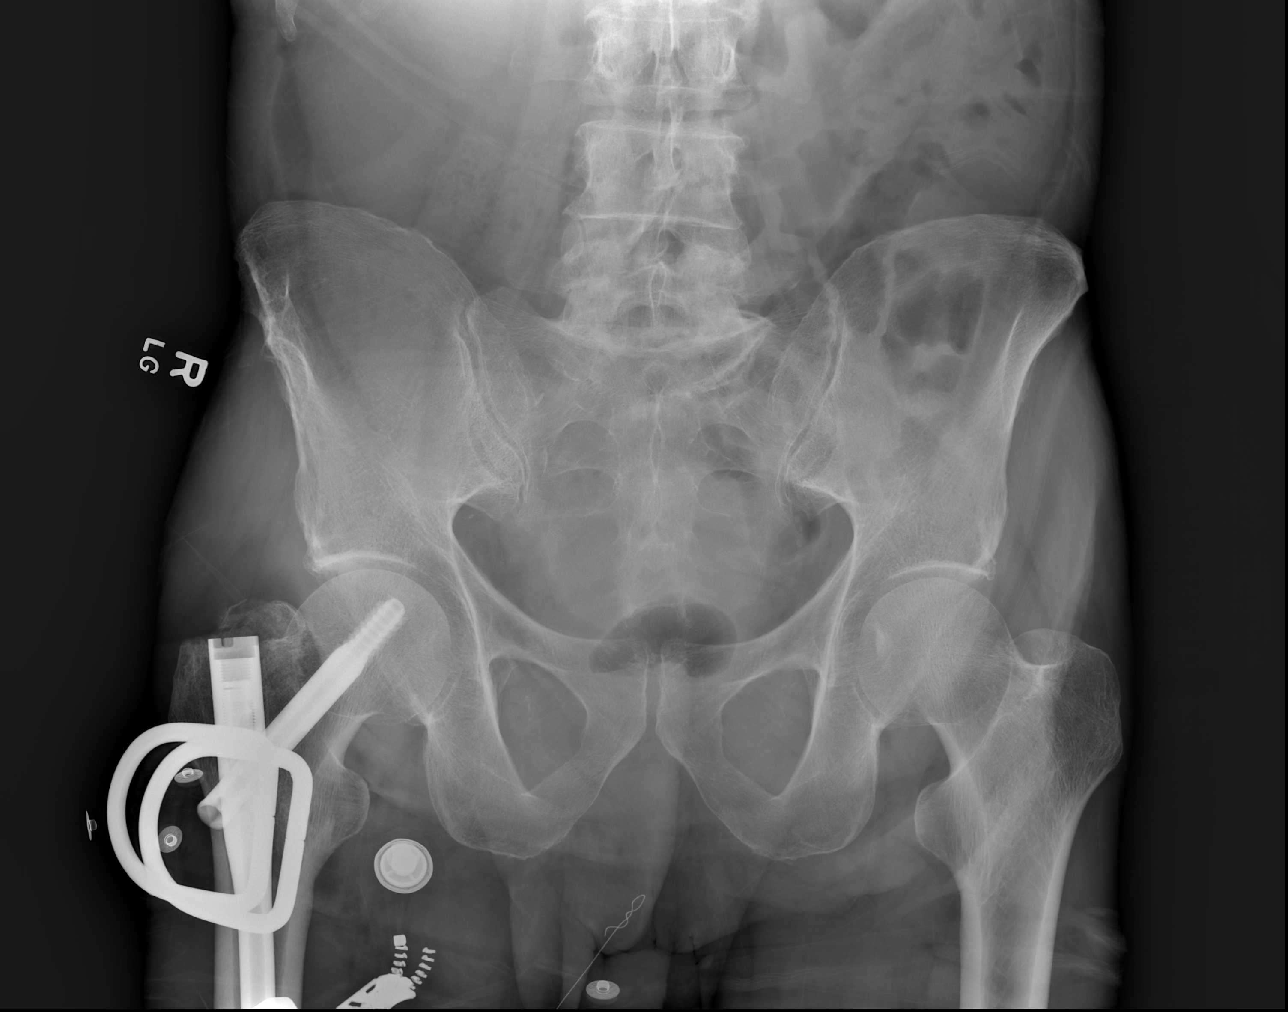

[x hip ap right (1 of 2)]
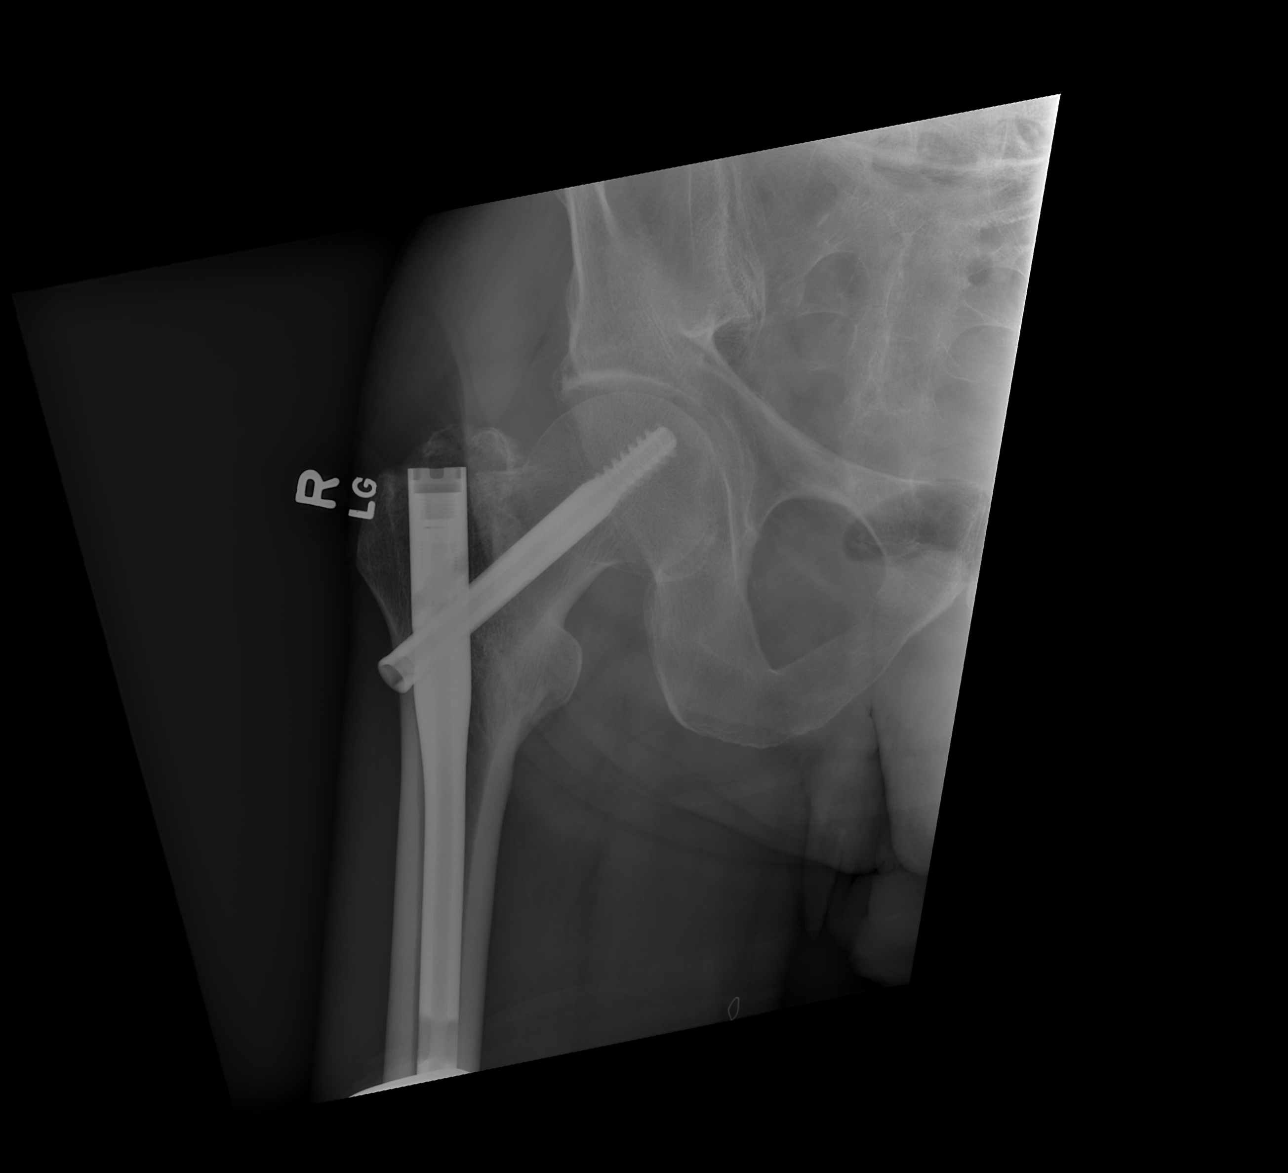

[x hip ap right (2 of 2)]
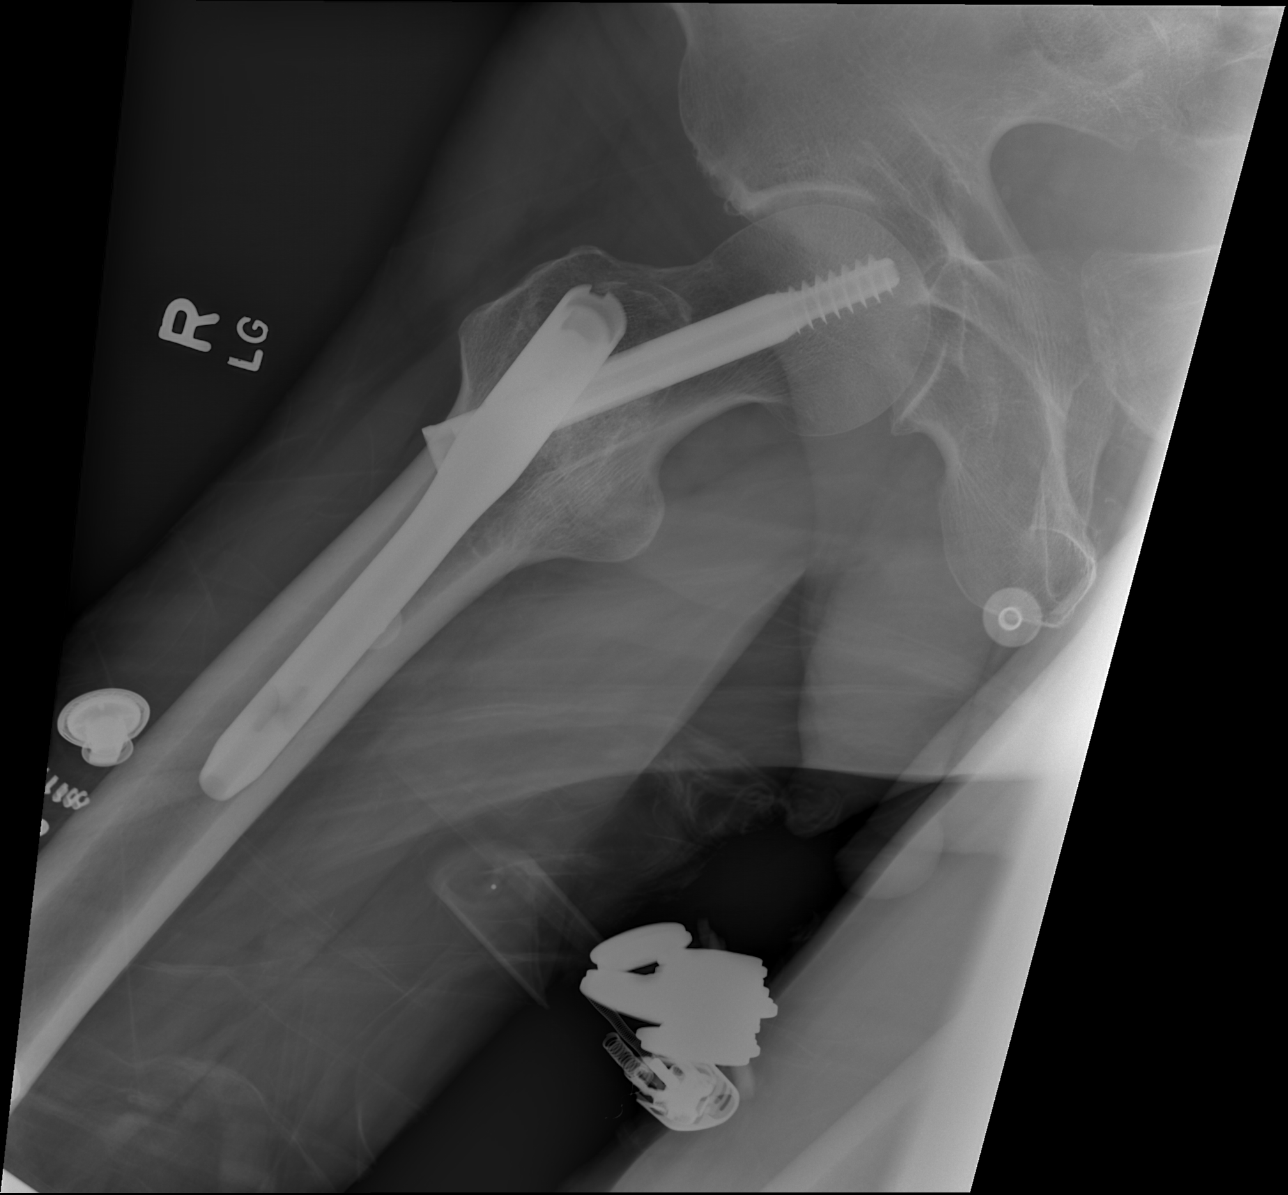

[3 of 3 positions shown; findings below may reference images not displayed]

FINDINGS: Changes consistent with prior surgical fixation in the proximal
right femur. Pelvic ring is intact. No hardware failure is noted. No
soft tissue changes are seen
IMPRESSION: Postsurgical changes without acute abnormality.

## 2022-05-26 IMAGING — CR DG KNEE COMPLETE 4+V*R*
4 series · 4 of 4 positions shown · non-contrast
Comparison: 07/02/2019

CLINICAL DATA: Right knee pain, history of prior fracture, initial
encounter

EXAM:
RIGHT KNEE - COMPLETE 4+ VIEW

[x knee ap right]
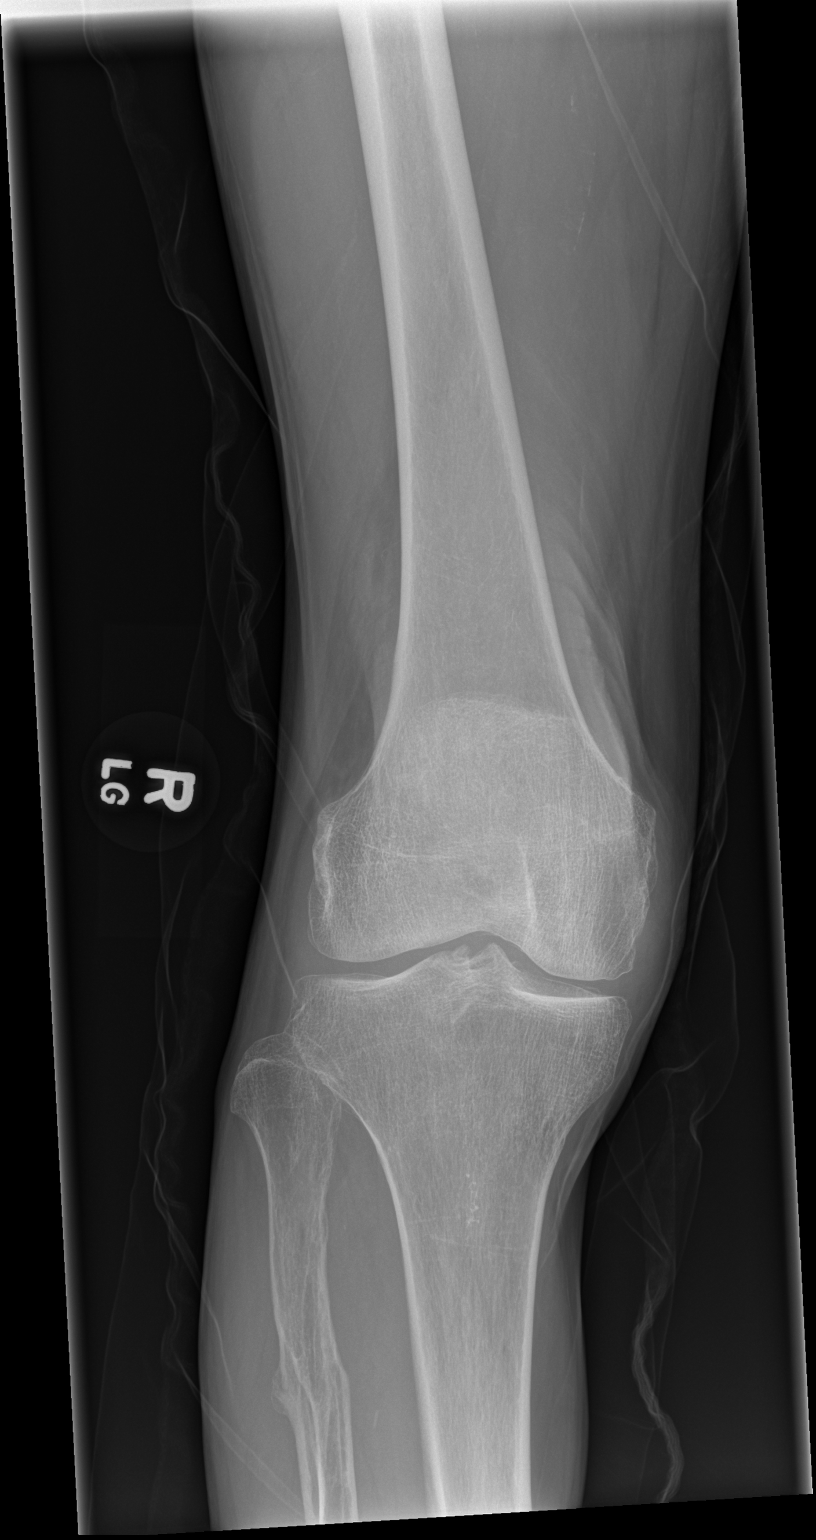

[x knee obl right (1 of 2)]
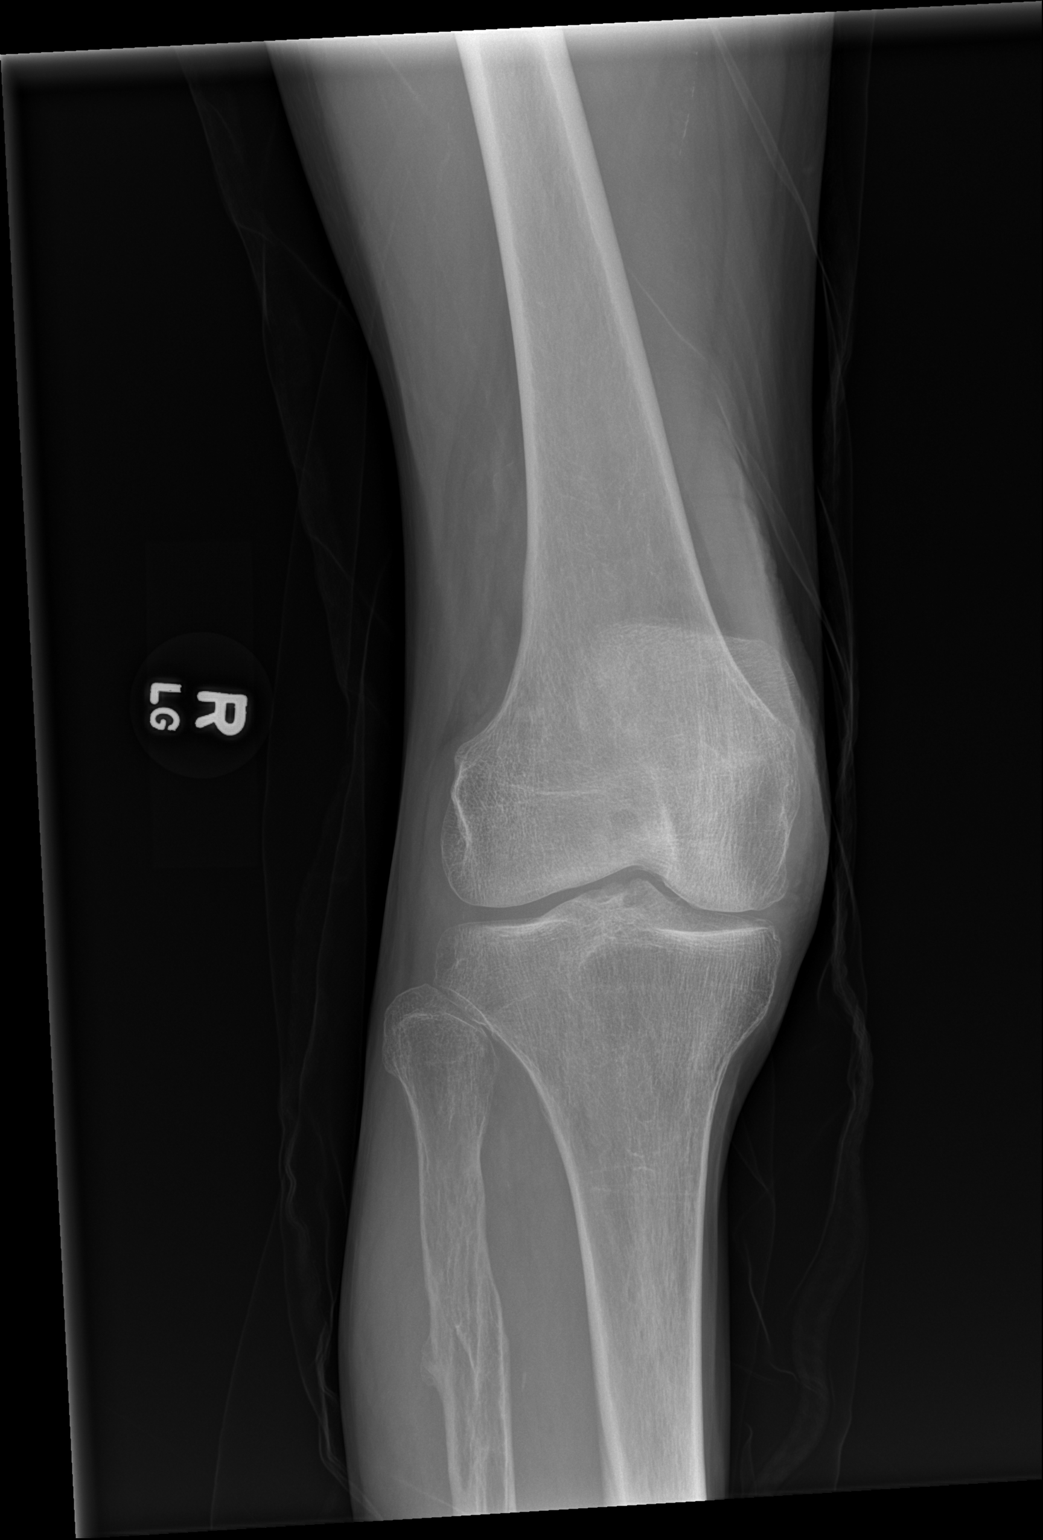

[x knee obl right (2 of 2)]
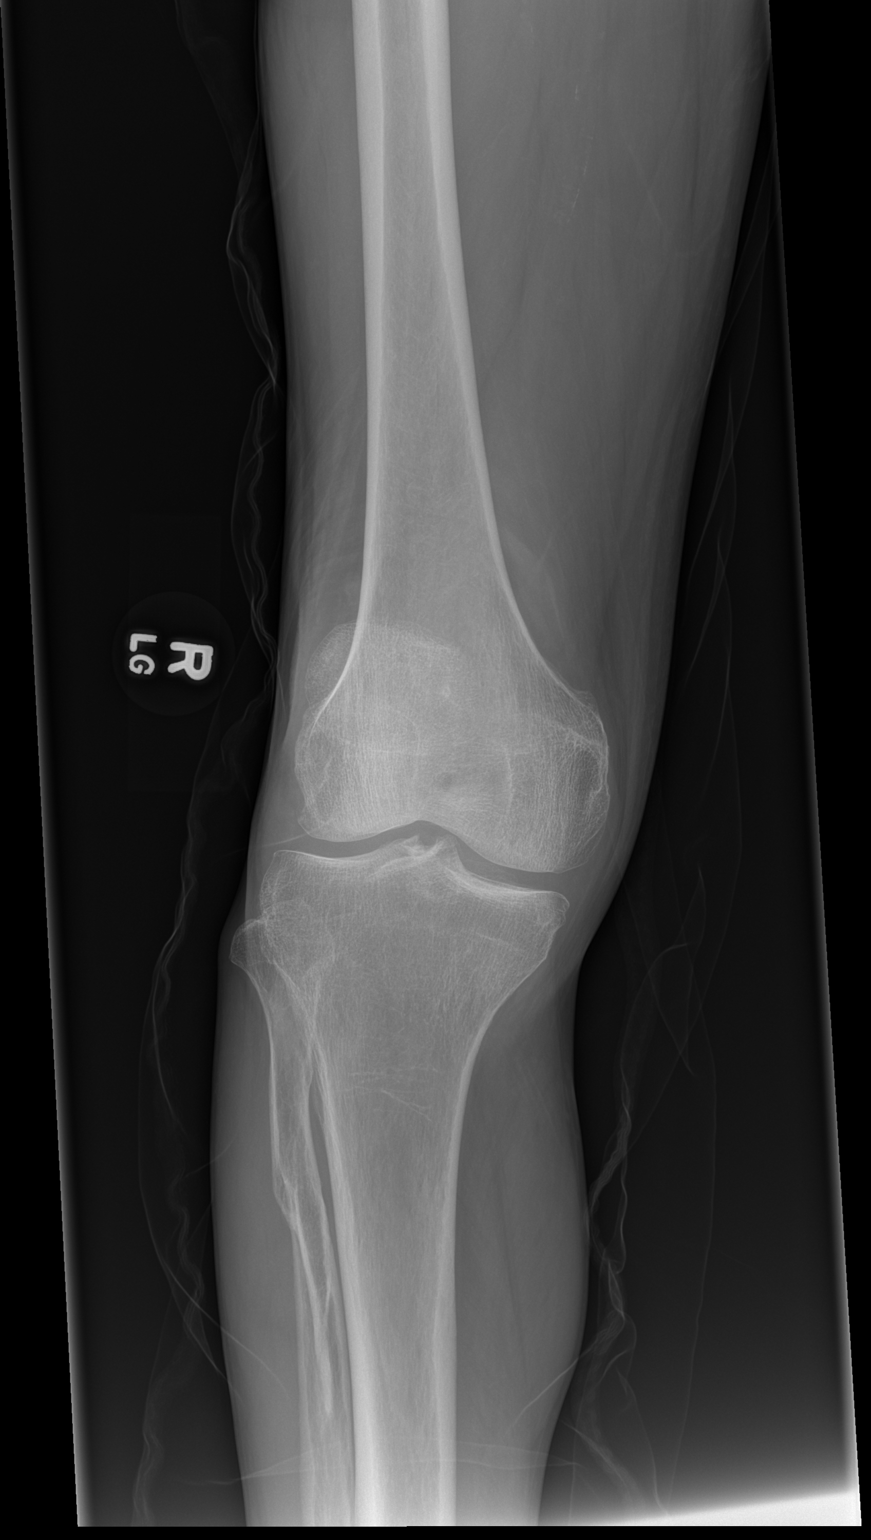

[x knee lat right]
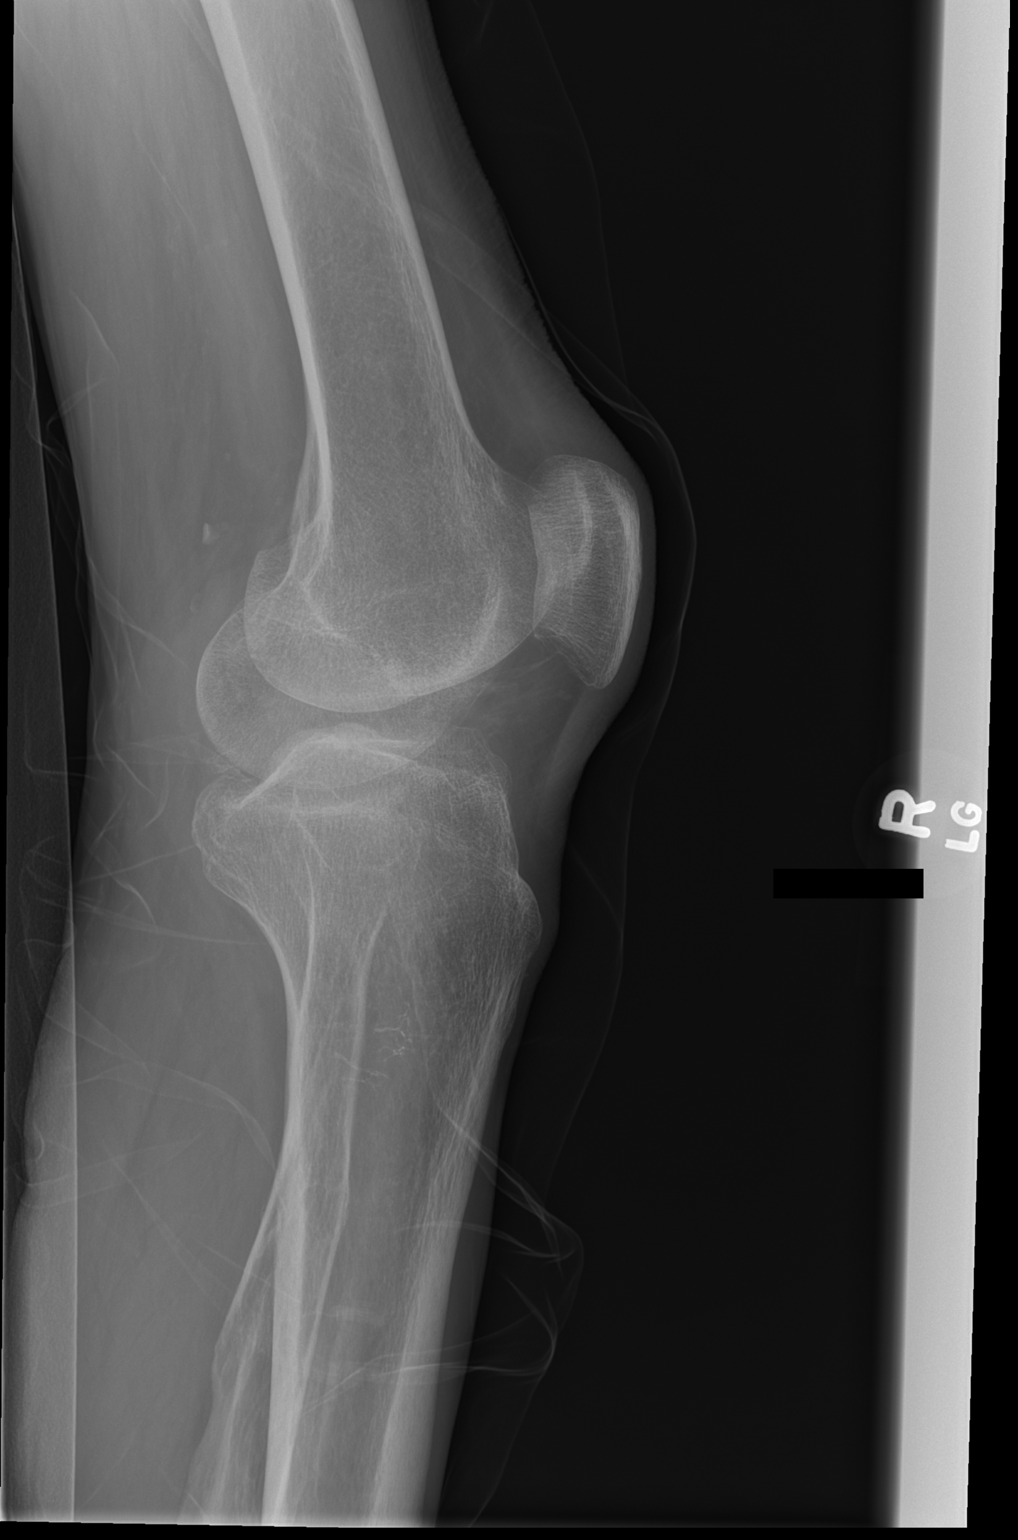

[4 of 4 positions shown; findings below may reference images not displayed]

FINDINGS: Healed proximal fibular fracture is noted. No acute fracture or
dislocation is seen. No joint effusion is noted. No soft tissue
changes are seen.
IMPRESSION: No healed proximal fibular fracture.  No acute abnormality noted.
